# Patient Record
Sex: Male | Born: 1945 | Race: White | Hispanic: No | Marital: Married | Smoking: Former smoker
Health system: Southern US, Community
[De-identification: ages and names within clinical notes are randomized; demographics above are authoritative.]

## PROBLEM LIST (undated history)

## (undated) DIAGNOSIS — N400 Enlarged prostate without lower urinary tract symptoms: Secondary | ICD-10-CM

## (undated) DIAGNOSIS — E119 Type 2 diabetes mellitus without complications: Secondary | ICD-10-CM

## (undated) HISTORY — PX: TRANSURETHRAL RESECTION OF PROSTATE: SHX73

---

## 2006-02-03 ENCOUNTER — Ambulatory Visit (HOSPITAL_COMMUNITY): Payer: Self-pay | Admitting: Psychiatry

## 2006-02-10 ENCOUNTER — Ambulatory Visit (HOSPITAL_COMMUNITY): Payer: Self-pay | Admitting: Psychology

## 2006-02-18 ENCOUNTER — Ambulatory Visit (HOSPITAL_COMMUNITY): Payer: Self-pay | Admitting: Psychology

## 2007-07-17 ENCOUNTER — Ambulatory Visit (HOSPITAL_COMMUNITY): Payer: Self-pay | Admitting: Psychology

## 2007-07-20 ENCOUNTER — Ambulatory Visit (HOSPITAL_COMMUNITY): Payer: Self-pay | Admitting: Psychiatry

## 2015-08-25 DIAGNOSIS — F411 Generalized anxiety disorder: Secondary | ICD-10-CM | POA: Diagnosis not present

## 2015-10-01 DIAGNOSIS — H18421 Band keratopathy, right eye: Secondary | ICD-10-CM | POA: Diagnosis not present

## 2015-10-01 DIAGNOSIS — H2701 Aphakia, right eye: Secondary | ICD-10-CM | POA: Diagnosis not present

## 2015-10-01 DIAGNOSIS — H35372 Puckering of macula, left eye: Secondary | ICD-10-CM | POA: Diagnosis not present

## 2015-12-15 DIAGNOSIS — F411 Generalized anxiety disorder: Secondary | ICD-10-CM | POA: Diagnosis not present

## 2016-01-20 DIAGNOSIS — H905 Unspecified sensorineural hearing loss: Secondary | ICD-10-CM | POA: Diagnosis not present

## 2016-03-01 DIAGNOSIS — F411 Generalized anxiety disorder: Secondary | ICD-10-CM | POA: Diagnosis not present

## 2016-04-15 DIAGNOSIS — F411 Generalized anxiety disorder: Secondary | ICD-10-CM | POA: Diagnosis not present

## 2016-05-17 DIAGNOSIS — F411 Generalized anxiety disorder: Secondary | ICD-10-CM | POA: Diagnosis not present

## 2016-05-28 DIAGNOSIS — R5383 Other fatigue: Secondary | ICD-10-CM | POA: Diagnosis not present

## 2016-05-28 DIAGNOSIS — Z125 Encounter for screening for malignant neoplasm of prostate: Secondary | ICD-10-CM | POA: Diagnosis not present

## 2016-05-28 DIAGNOSIS — Z79899 Other long term (current) drug therapy: Secondary | ICD-10-CM | POA: Diagnosis not present

## 2016-05-28 DIAGNOSIS — Z299 Encounter for prophylactic measures, unspecified: Secondary | ICD-10-CM | POA: Diagnosis not present

## 2016-05-28 DIAGNOSIS — E119 Type 2 diabetes mellitus without complications: Secondary | ICD-10-CM | POA: Diagnosis not present

## 2016-05-28 DIAGNOSIS — E7800 Pure hypercholesterolemia, unspecified: Secondary | ICD-10-CM | POA: Diagnosis not present

## 2016-05-28 DIAGNOSIS — Z1211 Encounter for screening for malignant neoplasm of colon: Secondary | ICD-10-CM | POA: Diagnosis not present

## 2016-05-28 DIAGNOSIS — Z1389 Encounter for screening for other disorder: Secondary | ICD-10-CM | POA: Diagnosis not present

## 2016-05-28 DIAGNOSIS — Z0000 Encounter for general adult medical examination without abnormal findings: Secondary | ICD-10-CM | POA: Diagnosis not present

## 2016-05-28 DIAGNOSIS — Z7189 Other specified counseling: Secondary | ICD-10-CM | POA: Diagnosis not present

## 2016-06-14 DIAGNOSIS — F411 Generalized anxiety disorder: Secondary | ICD-10-CM | POA: Diagnosis not present

## 2016-06-22 DIAGNOSIS — R972 Elevated prostate specific antigen [PSA]: Secondary | ICD-10-CM | POA: Diagnosis not present

## 2016-07-19 DIAGNOSIS — F332 Major depressive disorder, recurrent severe without psychotic features: Secondary | ICD-10-CM | POA: Diagnosis not present

## 2016-07-19 DIAGNOSIS — L570 Actinic keratosis: Secondary | ICD-10-CM | POA: Diagnosis not present

## 2016-07-26 DIAGNOSIS — F411 Generalized anxiety disorder: Secondary | ICD-10-CM | POA: Diagnosis not present

## 2016-08-05 DIAGNOSIS — R972 Elevated prostate specific antigen [PSA]: Secondary | ICD-10-CM | POA: Diagnosis not present

## 2016-08-12 DIAGNOSIS — N401 Enlarged prostate with lower urinary tract symptoms: Secondary | ICD-10-CM | POA: Diagnosis not present

## 2016-08-12 DIAGNOSIS — R3912 Poor urinary stream: Secondary | ICD-10-CM | POA: Diagnosis not present

## 2016-08-12 DIAGNOSIS — R3911 Hesitancy of micturition: Secondary | ICD-10-CM | POA: Diagnosis not present

## 2016-08-12 DIAGNOSIS — R972 Elevated prostate specific antigen [PSA]: Secondary | ICD-10-CM | POA: Diagnosis not present

## 2016-08-12 DIAGNOSIS — R351 Nocturia: Secondary | ICD-10-CM | POA: Diagnosis not present

## 2016-08-18 DIAGNOSIS — Z713 Dietary counseling and surveillance: Secondary | ICD-10-CM | POA: Diagnosis not present

## 2016-08-18 DIAGNOSIS — E119 Type 2 diabetes mellitus without complications: Secondary | ICD-10-CM | POA: Diagnosis not present

## 2016-08-18 DIAGNOSIS — F329 Major depressive disorder, single episode, unspecified: Secondary | ICD-10-CM | POA: Diagnosis not present

## 2016-08-18 DIAGNOSIS — Z6826 Body mass index (BMI) 26.0-26.9, adult: Secondary | ICD-10-CM | POA: Diagnosis not present

## 2016-08-18 DIAGNOSIS — I10 Essential (primary) hypertension: Secondary | ICD-10-CM | POA: Diagnosis not present

## 2016-08-18 DIAGNOSIS — G629 Polyneuropathy, unspecified: Secondary | ICD-10-CM | POA: Diagnosis not present

## 2016-08-18 DIAGNOSIS — Z299 Encounter for prophylactic measures, unspecified: Secondary | ICD-10-CM | POA: Diagnosis not present

## 2016-08-18 DIAGNOSIS — N400 Enlarged prostate without lower urinary tract symptoms: Secondary | ICD-10-CM | POA: Diagnosis not present

## 2016-09-09 DIAGNOSIS — Z139 Encounter for screening, unspecified: Secondary | ICD-10-CM | POA: Diagnosis not present

## 2016-09-09 DIAGNOSIS — R3911 Hesitancy of micturition: Secondary | ICD-10-CM | POA: Diagnosis not present

## 2016-09-09 DIAGNOSIS — R351 Nocturia: Secondary | ICD-10-CM | POA: Diagnosis not present

## 2016-09-09 DIAGNOSIS — Z719 Counseling, unspecified: Secondary | ICD-10-CM | POA: Diagnosis not present

## 2016-09-09 DIAGNOSIS — N401 Enlarged prostate with lower urinary tract symptoms: Secondary | ICD-10-CM | POA: Diagnosis not present

## 2016-09-28 DIAGNOSIS — F411 Generalized anxiety disorder: Secondary | ICD-10-CM | POA: Diagnosis not present

## 2016-10-04 DIAGNOSIS — Z299 Encounter for prophylactic measures, unspecified: Secondary | ICD-10-CM | POA: Diagnosis not present

## 2016-10-04 DIAGNOSIS — E7800 Pure hypercholesterolemia, unspecified: Secondary | ICD-10-CM | POA: Diagnosis not present

## 2016-10-04 DIAGNOSIS — Z713 Dietary counseling and surveillance: Secondary | ICD-10-CM | POA: Diagnosis not present

## 2016-10-04 DIAGNOSIS — E1165 Type 2 diabetes mellitus with hyperglycemia: Secondary | ICD-10-CM | POA: Diagnosis not present

## 2016-10-04 DIAGNOSIS — N400 Enlarged prostate without lower urinary tract symptoms: Secondary | ICD-10-CM | POA: Diagnosis not present

## 2016-10-04 DIAGNOSIS — I10 Essential (primary) hypertension: Secondary | ICD-10-CM | POA: Diagnosis not present

## 2016-10-04 DIAGNOSIS — F329 Major depressive disorder, single episode, unspecified: Secondary | ICD-10-CM | POA: Diagnosis not present

## 2016-10-04 DIAGNOSIS — R972 Elevated prostate specific antigen [PSA]: Secondary | ICD-10-CM | POA: Diagnosis not present

## 2016-10-04 DIAGNOSIS — G629 Polyneuropathy, unspecified: Secondary | ICD-10-CM | POA: Diagnosis not present

## 2016-10-15 DIAGNOSIS — H2701 Aphakia, right eye: Secondary | ICD-10-CM | POA: Diagnosis not present

## 2016-10-15 DIAGNOSIS — H2512 Age-related nuclear cataract, left eye: Secondary | ICD-10-CM | POA: Diagnosis not present

## 2016-11-17 DIAGNOSIS — Z85828 Personal history of other malignant neoplasm of skin: Secondary | ICD-10-CM | POA: Diagnosis not present

## 2016-11-17 DIAGNOSIS — L570 Actinic keratosis: Secondary | ICD-10-CM | POA: Diagnosis not present

## 2016-11-17 DIAGNOSIS — D485 Neoplasm of uncertain behavior of skin: Secondary | ICD-10-CM | POA: Diagnosis not present

## 2016-11-17 DIAGNOSIS — L820 Inflamed seborrheic keratosis: Secondary | ICD-10-CM | POA: Diagnosis not present

## 2016-12-01 DIAGNOSIS — M79672 Pain in left foot: Secondary | ICD-10-CM | POA: Diagnosis not present

## 2016-12-01 DIAGNOSIS — B351 Tinea unguium: Secondary | ICD-10-CM | POA: Diagnosis not present

## 2016-12-01 DIAGNOSIS — M79671 Pain in right foot: Secondary | ICD-10-CM | POA: Diagnosis not present

## 2016-12-29 DIAGNOSIS — F411 Generalized anxiety disorder: Secondary | ICD-10-CM | POA: Diagnosis not present

## 2017-01-17 DIAGNOSIS — N400 Enlarged prostate without lower urinary tract symptoms: Secondary | ICD-10-CM | POA: Diagnosis not present

## 2017-01-17 DIAGNOSIS — E1165 Type 2 diabetes mellitus with hyperglycemia: Secondary | ICD-10-CM | POA: Diagnosis not present

## 2017-01-17 DIAGNOSIS — Z6827 Body mass index (BMI) 27.0-27.9, adult: Secondary | ICD-10-CM | POA: Diagnosis not present

## 2017-01-17 DIAGNOSIS — E7800 Pure hypercholesterolemia, unspecified: Secondary | ICD-10-CM | POA: Diagnosis not present

## 2017-01-17 DIAGNOSIS — Z299 Encounter for prophylactic measures, unspecified: Secondary | ICD-10-CM | POA: Diagnosis not present

## 2017-01-17 DIAGNOSIS — I10 Essential (primary) hypertension: Secondary | ICD-10-CM | POA: Diagnosis not present

## 2017-01-20 DIAGNOSIS — N401 Enlarged prostate with lower urinary tract symptoms: Secondary | ICD-10-CM | POA: Diagnosis not present

## 2017-01-20 DIAGNOSIS — R3911 Hesitancy of micturition: Secondary | ICD-10-CM | POA: Diagnosis not present

## 2017-01-20 DIAGNOSIS — R3912 Poor urinary stream: Secondary | ICD-10-CM | POA: Diagnosis not present

## 2017-01-20 DIAGNOSIS — R972 Elevated prostate specific antigen [PSA]: Secondary | ICD-10-CM | POA: Diagnosis not present

## 2017-01-20 DIAGNOSIS — Z719 Counseling, unspecified: Secondary | ICD-10-CM | POA: Diagnosis not present

## 2017-02-28 DIAGNOSIS — F332 Major depressive disorder, recurrent severe without psychotic features: Secondary | ICD-10-CM | POA: Diagnosis not present

## 2017-04-04 DIAGNOSIS — F411 Generalized anxiety disorder: Secondary | ICD-10-CM | POA: Diagnosis not present

## 2017-04-06 DIAGNOSIS — R05 Cough: Secondary | ICD-10-CM | POA: Diagnosis not present

## 2017-04-06 DIAGNOSIS — R609 Edema, unspecified: Secondary | ICD-10-CM | POA: Diagnosis not present

## 2017-04-06 DIAGNOSIS — Z136 Encounter for screening for cardiovascular disorders: Secondary | ICD-10-CM | POA: Diagnosis not present

## 2017-04-06 DIAGNOSIS — R5383 Other fatigue: Secondary | ICD-10-CM | POA: Diagnosis not present

## 2017-05-09 DIAGNOSIS — F332 Major depressive disorder, recurrent severe without psychotic features: Secondary | ICD-10-CM | POA: Diagnosis not present

## 2017-05-11 DIAGNOSIS — R600 Localized edema: Secondary | ICD-10-CM | POA: Diagnosis not present

## 2017-05-23 DIAGNOSIS — R600 Localized edema: Secondary | ICD-10-CM | POA: Diagnosis not present

## 2017-05-31 DIAGNOSIS — R972 Elevated prostate specific antigen [PSA]: Secondary | ICD-10-CM | POA: Diagnosis not present

## 2017-06-07 DIAGNOSIS — R972 Elevated prostate specific antigen [PSA]: Secondary | ICD-10-CM | POA: Diagnosis not present

## 2017-06-10 DIAGNOSIS — Z1331 Encounter for screening for depression: Secondary | ICD-10-CM | POA: Diagnosis not present

## 2017-06-10 DIAGNOSIS — Z1339 Encounter for screening examination for other mental health and behavioral disorders: Secondary | ICD-10-CM | POA: Diagnosis not present

## 2017-06-10 DIAGNOSIS — I10 Essential (primary) hypertension: Secondary | ICD-10-CM | POA: Diagnosis not present

## 2017-06-10 DIAGNOSIS — E7800 Pure hypercholesterolemia, unspecified: Secondary | ICD-10-CM | POA: Diagnosis not present

## 2017-06-10 DIAGNOSIS — R5383 Other fatigue: Secondary | ICD-10-CM | POA: Diagnosis not present

## 2017-06-10 DIAGNOSIS — Z79899 Other long term (current) drug therapy: Secondary | ICD-10-CM | POA: Diagnosis not present

## 2017-06-10 DIAGNOSIS — Z7189 Other specified counseling: Secondary | ICD-10-CM | POA: Diagnosis not present

## 2017-06-10 DIAGNOSIS — Z6827 Body mass index (BMI) 27.0-27.9, adult: Secondary | ICD-10-CM | POA: Diagnosis not present

## 2017-06-10 DIAGNOSIS — Z1211 Encounter for screening for malignant neoplasm of colon: Secondary | ICD-10-CM | POA: Diagnosis not present

## 2017-06-10 DIAGNOSIS — Z0000 Encounter for general adult medical examination without abnormal findings: Secondary | ICD-10-CM | POA: Diagnosis not present

## 2017-06-10 DIAGNOSIS — Z299 Encounter for prophylactic measures, unspecified: Secondary | ICD-10-CM | POA: Diagnosis not present

## 2017-06-27 DIAGNOSIS — F411 Generalized anxiety disorder: Secondary | ICD-10-CM | POA: Diagnosis not present

## 2017-07-15 DIAGNOSIS — M7541 Impingement syndrome of right shoulder: Secondary | ICD-10-CM | POA: Diagnosis not present

## 2017-07-15 DIAGNOSIS — M19011 Primary osteoarthritis, right shoulder: Secondary | ICD-10-CM | POA: Diagnosis not present

## 2017-07-15 DIAGNOSIS — G8929 Other chronic pain: Secondary | ICD-10-CM | POA: Diagnosis not present

## 2017-07-15 DIAGNOSIS — R29898 Other symptoms and signs involving the musculoskeletal system: Secondary | ICD-10-CM | POA: Diagnosis not present

## 2017-07-15 DIAGNOSIS — M25511 Pain in right shoulder: Secondary | ICD-10-CM | POA: Diagnosis not present

## 2017-07-18 DIAGNOSIS — D0461 Carcinoma in situ of skin of right upper limb, including shoulder: Secondary | ICD-10-CM | POA: Diagnosis not present

## 2017-07-18 DIAGNOSIS — Z85828 Personal history of other malignant neoplasm of skin: Secondary | ICD-10-CM | POA: Diagnosis not present

## 2017-07-18 DIAGNOSIS — L570 Actinic keratosis: Secondary | ICD-10-CM | POA: Diagnosis not present

## 2017-07-18 DIAGNOSIS — D485 Neoplasm of uncertain behavior of skin: Secondary | ICD-10-CM | POA: Diagnosis not present

## 2017-07-22 DIAGNOSIS — M75101 Unspecified rotator cuff tear or rupture of right shoulder, not specified as traumatic: Secondary | ICD-10-CM | POA: Diagnosis not present

## 2017-07-22 DIAGNOSIS — M75121 Complete rotator cuff tear or rupture of right shoulder, not specified as traumatic: Secondary | ICD-10-CM | POA: Diagnosis not present

## 2017-07-22 DIAGNOSIS — R29898 Other symptoms and signs involving the musculoskeletal system: Secondary | ICD-10-CM | POA: Diagnosis not present

## 2017-07-22 DIAGNOSIS — G8929 Other chronic pain: Secondary | ICD-10-CM | POA: Diagnosis not present

## 2017-07-22 DIAGNOSIS — Y33XXXA Other specified events, undetermined intent, initial encounter: Secondary | ICD-10-CM | POA: Diagnosis not present

## 2017-07-22 DIAGNOSIS — M25511 Pain in right shoulder: Secondary | ICD-10-CM | POA: Diagnosis not present

## 2017-07-22 DIAGNOSIS — S46011A Strain of muscle(s) and tendon(s) of the rotator cuff of right shoulder, initial encounter: Secondary | ICD-10-CM | POA: Diagnosis not present

## 2017-08-04 DIAGNOSIS — C44622 Squamous cell carcinoma of skin of right upper limb, including shoulder: Secondary | ICD-10-CM | POA: Diagnosis not present

## 2017-09-05 DIAGNOSIS — M19011 Primary osteoarthritis, right shoulder: Secondary | ICD-10-CM | POA: Diagnosis not present

## 2017-09-05 DIAGNOSIS — M75121 Complete rotator cuff tear or rupture of right shoulder, not specified as traumatic: Secondary | ICD-10-CM | POA: Diagnosis not present

## 2017-09-05 DIAGNOSIS — M7541 Impingement syndrome of right shoulder: Secondary | ICD-10-CM | POA: Diagnosis not present

## 2017-09-05 DIAGNOSIS — M67921 Unspecified disorder of synovium and tendon, right upper arm: Secondary | ICD-10-CM | POA: Diagnosis not present

## 2017-09-05 DIAGNOSIS — M65811 Other synovitis and tenosynovitis, right shoulder: Secondary | ICD-10-CM | POA: Diagnosis not present

## 2017-09-14 DIAGNOSIS — F332 Major depressive disorder, recurrent severe without psychotic features: Secondary | ICD-10-CM | POA: Diagnosis not present

## 2017-09-19 DIAGNOSIS — F411 Generalized anxiety disorder: Secondary | ICD-10-CM | POA: Diagnosis not present

## 2017-09-21 DIAGNOSIS — Z136 Encounter for screening for cardiovascular disorders: Secondary | ICD-10-CM | POA: Diagnosis not present

## 2017-09-21 DIAGNOSIS — R5383 Other fatigue: Secondary | ICD-10-CM | POA: Diagnosis not present

## 2017-09-21 DIAGNOSIS — R609 Edema, unspecified: Secondary | ICD-10-CM | POA: Diagnosis not present

## 2018-01-16 DIAGNOSIS — L570 Actinic keratosis: Secondary | ICD-10-CM | POA: Diagnosis not present

## 2018-02-22 DIAGNOSIS — H2512 Age-related nuclear cataract, left eye: Secondary | ICD-10-CM | POA: Diagnosis not present

## 2018-02-22 DIAGNOSIS — H2701 Aphakia, right eye: Secondary | ICD-10-CM | POA: Diagnosis not present

## 2018-02-22 DIAGNOSIS — H18421 Band keratopathy, right eye: Secondary | ICD-10-CM | POA: Diagnosis not present

## 2018-03-06 DIAGNOSIS — F411 Generalized anxiety disorder: Secondary | ICD-10-CM | POA: Diagnosis not present

## 2018-03-07 ENCOUNTER — Ambulatory Visit (INDEPENDENT_AMBULATORY_CARE_PROVIDER_SITE_OTHER): Payer: Medicare Other | Admitting: Urology

## 2018-03-07 DIAGNOSIS — R972 Elevated prostate specific antigen [PSA]: Secondary | ICD-10-CM

## 2018-03-07 DIAGNOSIS — N401 Enlarged prostate with lower urinary tract symptoms: Secondary | ICD-10-CM | POA: Diagnosis not present

## 2018-03-07 DIAGNOSIS — R351 Nocturia: Secondary | ICD-10-CM | POA: Diagnosis not present

## 2018-05-05 DIAGNOSIS — R972 Elevated prostate specific antigen [PSA]: Secondary | ICD-10-CM | POA: Diagnosis not present

## 2018-06-26 DIAGNOSIS — Z79899 Other long term (current) drug therapy: Secondary | ICD-10-CM | POA: Diagnosis not present

## 2018-06-26 DIAGNOSIS — Z1339 Encounter for screening examination for other mental health and behavioral disorders: Secondary | ICD-10-CM | POA: Diagnosis not present

## 2018-06-26 DIAGNOSIS — R5383 Other fatigue: Secondary | ICD-10-CM | POA: Diagnosis not present

## 2018-06-26 DIAGNOSIS — Z1331 Encounter for screening for depression: Secondary | ICD-10-CM | POA: Diagnosis not present

## 2018-06-26 DIAGNOSIS — E7800 Pure hypercholesterolemia, unspecified: Secondary | ICD-10-CM | POA: Diagnosis not present

## 2018-06-26 DIAGNOSIS — Z6829 Body mass index (BMI) 29.0-29.9, adult: Secondary | ICD-10-CM | POA: Diagnosis not present

## 2018-06-26 DIAGNOSIS — Z7189 Other specified counseling: Secondary | ICD-10-CM | POA: Diagnosis not present

## 2018-06-26 DIAGNOSIS — Z1211 Encounter for screening for malignant neoplasm of colon: Secondary | ICD-10-CM | POA: Diagnosis not present

## 2018-06-26 DIAGNOSIS — Z0000 Encounter for general adult medical examination without abnormal findings: Secondary | ICD-10-CM | POA: Diagnosis not present

## 2018-06-26 DIAGNOSIS — E1165 Type 2 diabetes mellitus with hyperglycemia: Secondary | ICD-10-CM | POA: Diagnosis not present

## 2018-06-26 DIAGNOSIS — Z125 Encounter for screening for malignant neoplasm of prostate: Secondary | ICD-10-CM | POA: Diagnosis not present

## 2018-06-26 DIAGNOSIS — I10 Essential (primary) hypertension: Secondary | ICD-10-CM | POA: Diagnosis not present

## 2018-06-26 DIAGNOSIS — Z299 Encounter for prophylactic measures, unspecified: Secondary | ICD-10-CM | POA: Diagnosis not present

## 2018-06-30 ENCOUNTER — Other Ambulatory Visit: Payer: Self-pay | Admitting: Psychiatry

## 2018-07-01 NOTE — Telephone Encounter (Signed)
Need to review his paper chart, not seen in epic yet

## 2018-07-06 DIAGNOSIS — J22 Unspecified acute lower respiratory infection: Secondary | ICD-10-CM | POA: Diagnosis not present

## 2018-07-14 ENCOUNTER — Encounter: Payer: Self-pay | Admitting: Emergency Medicine

## 2018-07-14 DIAGNOSIS — F319 Bipolar disorder, unspecified: Secondary | ICD-10-CM | POA: Insufficient documentation

## 2018-07-14 DIAGNOSIS — F411 Generalized anxiety disorder: Secondary | ICD-10-CM | POA: Insufficient documentation

## 2018-07-17 DIAGNOSIS — L905 Scar conditions and fibrosis of skin: Secondary | ICD-10-CM | POA: Diagnosis not present

## 2018-07-17 DIAGNOSIS — D485 Neoplasm of uncertain behavior of skin: Secondary | ICD-10-CM | POA: Diagnosis not present

## 2018-07-17 DIAGNOSIS — L570 Actinic keratosis: Secondary | ICD-10-CM | POA: Diagnosis not present

## 2018-07-21 DIAGNOSIS — Z23 Encounter for immunization: Secondary | ICD-10-CM | POA: Diagnosis not present

## 2018-07-25 DIAGNOSIS — R69 Illness, unspecified: Secondary | ICD-10-CM | POA: Diagnosis not present

## 2018-08-10 ENCOUNTER — Telehealth: Payer: Self-pay | Admitting: Psychiatry

## 2018-08-10 NOTE — Telephone Encounter (Signed)
Needs lithium level drawn. Send lab order to [facility] in [place]  [phone]

## 2018-08-14 ENCOUNTER — Ambulatory Visit: Payer: Self-pay | Admitting: Psychiatry

## 2018-08-17 ENCOUNTER — Other Ambulatory Visit: Payer: Self-pay | Admitting: Psychiatry

## 2018-08-17 NOTE — Telephone Encounter (Signed)
Lab order faxed to [facility] [phone]

## 2018-08-17 NOTE — Telephone Encounter (Signed)
Not seen in epic Pull paper chart for review

## 2018-08-21 DIAGNOSIS — Z79899 Other long term (current) drug therapy: Secondary | ICD-10-CM | POA: Diagnosis not present

## 2018-08-22 ENCOUNTER — Ambulatory Visit: Payer: Medicare Other | Admitting: Urology

## 2018-08-22 DIAGNOSIS — R972 Elevated prostate specific antigen [PSA]: Secondary | ICD-10-CM | POA: Diagnosis not present

## 2018-08-22 DIAGNOSIS — N400 Enlarged prostate without lower urinary tract symptoms: Secondary | ICD-10-CM | POA: Diagnosis not present

## 2018-08-22 DIAGNOSIS — R351 Nocturia: Secondary | ICD-10-CM | POA: Diagnosis not present

## 2018-08-24 ENCOUNTER — Ambulatory Visit: Payer: Self-pay | Admitting: Psychiatry

## 2018-09-20 ENCOUNTER — Other Ambulatory Visit: Payer: Self-pay

## 2018-09-20 ENCOUNTER — Ambulatory Visit: Payer: Medicare HMO | Admitting: Psychiatry

## 2018-09-20 ENCOUNTER — Encounter: Payer: Self-pay | Admitting: Psychiatry

## 2018-09-20 DIAGNOSIS — G609 Hereditary and idiopathic neuropathy, unspecified: Secondary | ICD-10-CM | POA: Diagnosis not present

## 2018-09-20 DIAGNOSIS — R69 Illness, unspecified: Secondary | ICD-10-CM | POA: Diagnosis not present

## 2018-09-20 DIAGNOSIS — F3181 Bipolar II disorder: Secondary | ICD-10-CM

## 2018-09-20 DIAGNOSIS — F5105 Insomnia due to other mental disorder: Secondary | ICD-10-CM | POA: Diagnosis not present

## 2018-09-20 DIAGNOSIS — F411 Generalized anxiety disorder: Secondary | ICD-10-CM

## 2018-09-20 NOTE — Progress Notes (Signed)
ERVEN RAMSON [id number] 1945-09-12 73 y.o.  Subjective:   Patient ID:  Joseph Banks is a 73 y.o. (DOB [birth year]) male.  Chief Complaint:  Chief Complaint  Patient presents with  . Follow-up    Medication Management    HPI Joseph Banks presents to the office today for follow-up of depression and anxiety.    Last seen August.   Changed lithium slightly to 600 mg and increased the gabapentin to 600 BID.  Marland Kitchen  This has improved compliance.  Increase in gabapentin helped the neuropathic tingling in R foot. Plays pickle ball regularly and tries to stay active. Mood good.  No depressive episodes.  Patient reports stable mood and denies depressed or irritable moods.  Patient denies any recent difficulty with anxiety.  Patient denies difficulty with sleep initiation but is interrrupted with normal quantity. Denies appetite disturbance.  Patient reports that energy and motivation have been good.  Patient denies any difficulty with concentration.  Patient denies any suicidal ideation.  Past Psychiatric Medication Trials: Citalopram no response paroxetine 40 with benefit, mirtazapine, duloxetine 90+ lithium with response, aripiprazole 10, lithium 900, trazodone with benefit, alprazolam, buspirone  Review of Systems:  Review of Systems  Neurological: Negative for tremors and weakness.  Psychiatric/Behavioral: Negative for agitation, behavioral problems, confusion, decreased concentration, dysphoric mood, hallucinations, self-injury, sleep disturbance and suicidal ideas. The patient is not nervous/anxious and is not hyperactive.     Medications: I have reviewed the patient's current medications.  Current Outpatient Medications  Medication Sig Dispense Refill  . DULoxetine (CYMBALTA) 30 MG capsule Take 30 mg by mouth daily.    Marland Kitchen gabapentin (NEURONTIN) 600 MG tablet Take 600 mg by mouth 3 (three) times daily.    Marland Kitchen lithium 600 MG capsule Take 600 mg by mouth every evening.    .  Potassium 99 MG TABS Take by mouth.    . traZODone (DESYREL) 50 MG tablet TAKE 1 TABLET BY MOUTH ONCE DAILY AT NIGHT 180 tablet 0   No current facility-administered medications for this visit.     Medication Side Effects: None  Allergies: No Known Allergies  No past medical history on file.  No family history on file.  Social History   Socioeconomic History  . Marital status: Married    Spouse name: Not on file  . Number of children: Not on file  . Years of education: Not on file  . Highest education level: Not on file  Occupational History  . Not on file  Social Needs  . Financial resource strain: Not on file  . Food insecurity:    Worry: Not on file    Inability: Not on file  . Transportation needs:    Medical: Not on file    Non-medical: Not on file  Tobacco Use  . Smoking status: Former Research scientist (life sciences)  . Smokeless tobacco: Never Used  Substance and Sexual Activity  . Alcohol use: Not on file  . Drug use: Not on file  . Sexual activity: Not on file  Lifestyle  . Physical activity:    Days per week: Not on file    Minutes per session: Not on file  . Stress: Not on file  Relationships  . Social connections:    Talks on phone: Not on file    Gets together: Not on file    Attends religious service: Not on file    Active member of club or organization: Not on file    Attends meetings of clubs or organizations:  Not on file    Relationship status: Not on file  . Intimate partner violence:    Fear of current or ex partner: Not on file    Emotionally abused: Not on file    Physically abused: Not on file    Forced sexual activity: Not on file  Other Topics Concern  . Not on file  Social History Narrative  . Not on file    Past Medical History, Surgical history, Social history, and Family history were reviewed and updated as appropriate.   Please see review of systems for further details on the patient's review from today.   Objective:   Physical Exam:  There  were no vitals taken for this visit.  Physical Exam Constitutional:      General: He is not in acute distress.    Appearance: He is well-developed.  Musculoskeletal:        General: No deformity.  Neurological:     Mental Status: He is alert and oriented to person, place, and time.     Motor: No tremor.     Coordination: Coordination normal.     Gait: Gait normal.  Psychiatric:        Attention and Perception: Attention normal. He is attentive.        Mood and Affect: Mood normal. Mood is not anxious or depressed. Affect is not labile, blunt, angry or inappropriate.        Speech: Speech normal.        Behavior: Behavior normal.        Thought Content: Thought content normal. Thought content does not include homicidal or suicidal ideation. Thought content does not include homicidal or suicidal plan.        Cognition and Memory: Cognition normal.        Judgment: Judgment normal.     Comments: Insight is fair.     Lab Review:  No results found for: NA, K, CL, CO2, GLUCOSE, BUN, CREATININE, CALCIUM, PROT, ALBUMIN, AST, ALT, ALKPHOS, BILITOT, GFRNONAA, GFRAA  No results found for: WBC, RBC, HGB, HCT, PLT, MCV, MCH, MCHC, RDW, LYMPHSABS, MONOABS, EOSABS, BASOSABS  No results found for: POCLITH, LITHIUM   No results found for: PHENYTOIN, PHENOBARB, VALPROATE, CBMZ   Labs from August 21, 2018 included BMP which was normal including calcium 9.4 and creatinine 1.2, TSH 1.85 normal, lithium 0.68 stable  .res Assessment: Plan:    Bipolar II disorder ([place])  Generalized anxiety disorder  Insomnia due to mental condition  Hereditary and idiopathic peripheral neuropathy   Greater than 50% of face to face time with patient was spent on counseling and coordination of care. We discussed Importance of consistency emphasized bc history or problems causing relapse.  His insight into the bipolar disorder is somewhat lacking as well and this was discussed.  Simplifying the medication has  helped compliant and he is more stable.  He is tolerating medications well.  Discussed the risk that antidepressants may cause mood cycling and mania but he appears to need it to manage the anxiety.  The lithium is working adequately.  Counseled patient regarding potential benefits, risks, and side effects of lithium to include potential risk of lithium affecting thyroid and renal function.  Discussed need for periodic lab monitoring to determine drug level and to assess for potential adverse effects.  Counseled patient regarding signs and symptoms of lithium toxicity and advised that they notify office immediately or seek urgent medical attention if experiencing these signs and symptoms.  Patient advised  to contact office with any questions or concerns.  The labs were good.  Gabapentin has helped the neuropathy and he is tolerating it well.  Trazodone is managing the insomnia in general with mild awakening.  Discussed the use of melatonin supplements which are acceptable.  Discussed risks of sedatives including fall risk at night.   History of severe anxiety has been managed with medication.  No med changes indicated.  This was a 30-minute appointment  Follow-up 6 months.  Lynder Parents, MD, DFAPA   Please see After Visit Summary for patient specific instructions.  Future Appointments  Date Time Provider [place]  03/26/2019 10:15 AM Cottle, Billey Co., MD [facility] None    No orders of the defined types were placed in this encounter.     -------------------------------

## 2018-10-04 ENCOUNTER — Telehealth: Payer: Self-pay | Admitting: Psychiatry

## 2018-10-04 NOTE — Telephone Encounter (Signed)
Pt can get a better price at [facility] [address] [facility] in [place], [place]. Please change his pharmacy. Also, call in a new Gabapentine RX to [facility].

## 2018-10-04 NOTE — Telephone Encounter (Signed)
ok.  Please send prescription to [facility] as patient requests

## 2018-10-05 ENCOUNTER — Other Ambulatory Visit: Payer: Self-pay

## 2018-10-05 MED ORDER — LITHIUM CARBONATE 600 MG PO CAPS: 600.0000 mg | ORAL_CAPSULE | Freq: Every evening | ORAL | 1 refills | Status: DC

## 2018-10-05 MED ORDER — GABAPENTIN 600 MG PO TABS: 600.0000 mg | ORAL_TABLET | Freq: Two times a day (BID) | ORAL | 1 refills | Status: DC

## 2018-10-05 MED ORDER — TRAZODONE HCL 50 MG PO TABS: ORAL_TABLET | ORAL | 1 refills | Status: DC

## 2018-10-05 MED ORDER — DULOXETINE HCL 30 MG PO CPEP: 30.0000 mg | ORAL_CAPSULE | Freq: Every day | ORAL | 1 refills | Status: DC

## 2018-10-05 NOTE — Telephone Encounter (Signed)
Prescriptions submitted.  

## 2018-10-05 NOTE — Progress Notes (Signed)
Patient asking to change his prescriptions to [facility], [place] Gabapentin called in and others escribed for 90 day plus 1 additional refill.

## 2018-12-11 DIAGNOSIS — Z299 Encounter for prophylactic measures, unspecified: Secondary | ICD-10-CM | POA: Diagnosis not present

## 2018-12-11 DIAGNOSIS — R5383 Other fatigue: Secondary | ICD-10-CM | POA: Diagnosis not present

## 2018-12-11 DIAGNOSIS — E1165 Type 2 diabetes mellitus with hyperglycemia: Secondary | ICD-10-CM | POA: Diagnosis not present

## 2018-12-11 DIAGNOSIS — I10 Essential (primary) hypertension: Secondary | ICD-10-CM | POA: Diagnosis not present

## 2018-12-11 DIAGNOSIS — Z6829 Body mass index (BMI) 29.0-29.9, adult: Secondary | ICD-10-CM | POA: Diagnosis not present

## 2018-12-25 DIAGNOSIS — E663 Overweight: Secondary | ICD-10-CM | POA: Diagnosis not present

## 2018-12-25 DIAGNOSIS — I10 Essential (primary) hypertension: Secondary | ICD-10-CM | POA: Diagnosis not present

## 2018-12-25 DIAGNOSIS — E119 Type 2 diabetes mellitus without complications: Secondary | ICD-10-CM | POA: Diagnosis not present

## 2019-01-23 DIAGNOSIS — E663 Overweight: Secondary | ICD-10-CM | POA: Diagnosis not present

## 2019-01-23 DIAGNOSIS — I10 Essential (primary) hypertension: Secondary | ICD-10-CM | POA: Diagnosis not present

## 2019-01-23 DIAGNOSIS — E119 Type 2 diabetes mellitus without complications: Secondary | ICD-10-CM | POA: Diagnosis not present

## 2019-02-19 DIAGNOSIS — I10 Essential (primary) hypertension: Secondary | ICD-10-CM | POA: Diagnosis not present

## 2019-02-19 DIAGNOSIS — Z299 Encounter for prophylactic measures, unspecified: Secondary | ICD-10-CM | POA: Diagnosis not present

## 2019-02-19 DIAGNOSIS — Z6828 Body mass index (BMI) 28.0-28.9, adult: Secondary | ICD-10-CM | POA: Diagnosis not present

## 2019-02-19 DIAGNOSIS — Z789 Other specified health status: Secondary | ICD-10-CM | POA: Diagnosis not present

## 2019-02-19 DIAGNOSIS — E7800 Pure hypercholesterolemia, unspecified: Secondary | ICD-10-CM | POA: Diagnosis not present

## 2019-02-19 DIAGNOSIS — E1165 Type 2 diabetes mellitus with hyperglycemia: Secondary | ICD-10-CM | POA: Diagnosis not present

## 2019-02-19 DIAGNOSIS — G629 Polyneuropathy, unspecified: Secondary | ICD-10-CM | POA: Diagnosis not present

## 2019-03-13 ENCOUNTER — Ambulatory Visit: Payer: Medicare HMO | Admitting: Urology

## 2019-03-22 DIAGNOSIS — N400 Enlarged prostate without lower urinary tract symptoms: Secondary | ICD-10-CM | POA: Diagnosis not present

## 2019-03-26 ENCOUNTER — Encounter: Payer: Self-pay | Admitting: Psychiatry

## 2019-03-26 ENCOUNTER — Other Ambulatory Visit: Payer: Self-pay

## 2019-03-26 ENCOUNTER — Ambulatory Visit (INDEPENDENT_AMBULATORY_CARE_PROVIDER_SITE_OTHER): Payer: Medicare HMO | Admitting: Psychiatry

## 2019-03-26 DIAGNOSIS — F411 Generalized anxiety disorder: Secondary | ICD-10-CM

## 2019-03-26 DIAGNOSIS — F5105 Insomnia due to other mental disorder: Secondary | ICD-10-CM

## 2019-03-26 DIAGNOSIS — R69 Illness, unspecified: Secondary | ICD-10-CM | POA: Diagnosis not present

## 2019-03-26 DIAGNOSIS — F3181 Bipolar II disorder: Secondary | ICD-10-CM | POA: Diagnosis not present

## 2019-03-26 NOTE — Progress Notes (Signed)
Joseph Banks [id number] 1946-06-09 73 y.o.   Virtual Visit via Telephone Note  I connected with pt by telephone and verified that I am speaking with the correct person using two identifiers.   I discussed the limitations, risks, security and privacy concerns of performing an evaluation and management service by telephone and the availability of in person appointments. I also discussed with the patient that there may be a patient responsible charge related to this service. The patient expressed understanding and agreed to proceed.  I discussed the assessment and treatment plan with the patient. The patient was provided an opportunity to ask questions and all were answered. The patient agreed with the plan and demonstrated an understanding of the instructions.   The patient was advised to call back or seek an in-person evaluation if the symptoms worsen or if the condition fails to improve as anticipated.  I provided 30 minutes of non-face-to-face time during this encounter. The call started at 1030 and ended at 11:00. The patient was located at home and the provider was located office.   Subjective:   Patient ID:  Joseph Banks is a 73 y.o. (DOB 1945-12-11) male.  Chief Complaint:  Chief Complaint  Patient presents with  . Follow-up    Medication Management  . Anxiety    Medication Management  . Other    Bipolar 2    Anxiety Patient reports no confusion, decreased concentration, nervous/anxious behavior or suicidal ideas.     Joseph Banks presents to the office today for follow-up of depression and anxiety.    In 2019 August.   Changed lithium slightly to 600 mg and increased the gabapentin to 600 BID.    Last visit in March he was doing well and was more compliant with medication with a good response.  No meds were changed.  No depression but don't sleep well with nocturia and neuropathy with toes tingling.  PCP suggested added Lyrica for neuropathy.  Tingling is  better.    This has improved compliance.  Increase in gabapentin helped the neuropathic tingling in R foot. Plays pickle ball regularly and tries to stay active. Mood good.  No depressive episodes.  Patient reports stable mood and denies depressed or irritable moods.  Patient denies any recent difficulty with anxiety.  Patient denies difficulty with sleep initiation but is interrrupted with normal quantity. Denies appetite disturbance.  Patient reports that energy and motivation have been good.  Patient denies any difficulty with concentration.  Patient denies any suicidal ideation.  Past Psychiatric Medication Trials: Citalopram no response paroxetine 40 with benefit, mirtazapine, duloxetine 90+ lithium with response, aripiprazole 10, lithium 900, trazodone with benefit, alprazolam, buspirone  Review of Systems:  Review of Systems  Genitourinary:       Nocturia  Neurological: Negative for tremors and weakness.       Tingling in toes  Psychiatric/Behavioral: Negative for agitation, behavioral problems, confusion, decreased concentration, dysphoric mood, hallucinations, self-injury, sleep disturbance and suicidal ideas. The patient is not nervous/anxious and is not hyperactive.     Medications: I have reviewed the patient's current medications.  Current Outpatient Medications  Medication Sig Dispense Refill  . DULoxetine (CYMBALTA) 30 MG capsule Take 1 capsule (30 mg total) by mouth daily. 90 capsule 1  . gabapentin (NEURONTIN) 600 MG tablet Take 1 tablet (600 mg total) by mouth 2 (two) times daily. 180 tablet 1  . lithium 600 MG capsule Take 1 capsule (600 mg total) by mouth every evening. 90 capsule  1  . metFORMIN (GLUCOPHAGE) 500 MG tablet Take 500 mg by mouth 2 (two) times daily.    . Potassium 99 MG TABS Take by mouth.    . pregabalin (LYRICA) 100 MG capsule Take 100 mg by mouth daily.    . traZODone (DESYREL) 50 MG tablet TAKE 1 TABLET BY MOUTH ONCE DAILY AT NIGHT 180 tablet 1   No  current facility-administered medications for this visit.     Medication Side Effects: None  Allergies: No Known Allergies  History reviewed. No pertinent past medical history.  History reviewed. No pertinent family history.  Social History   Socioeconomic History  . Marital status: Married    Spouse name: Not on file  . Number of children: Not on file  . Years of education: Not on file  . Highest education level: Not on file  Occupational History  . Not on file  Social Needs  . Financial resource strain: Not on file  . Food insecurity    Worry: Not on file    Inability: Not on file  . Transportation needs    Medical: Not on file    Non-medical: Not on file  Tobacco Use  . Smoking status: Former Research scientist (life sciences)  . Smokeless tobacco: Never Used  Substance and Sexual Activity  . Alcohol use: Not on file  . Drug use: Not on file  . Sexual activity: Not on file  Lifestyle  . Physical activity    Days per week: Not on file    Minutes per session: Not on file  . Stress: Not on file  Relationships  . Social Herbalist on phone: Not on file    Gets together: Not on file    Attends religious service: Not on file    Active member of club or organization: Not on file    Attends meetings of clubs or organizations: Not on file    Relationship status: Not on file  . Intimate partner violence    Fear of current or ex partner: Not on file    Emotionally abused: Not on file    Physically abused: Not on file    Forced sexual activity: Not on file  Other Topics Concern  . Not on file  Social History Narrative  . Not on file    Past Medical History, Surgical history, Social history, and Family history were reviewed and updated as appropriate.   Please see review of systems for further details on the patient's review from today.   Objective:   Physical Exam:  There were no vitals taken for this visit.  Physical Exam Neurological:     Mental Status: He is alert and  oriented to person, place, and time.     Cranial Nerves: No dysarthria.  Psychiatric:        Attention and Perception: Attention normal. He is attentive. He does not perceive auditory hallucinations.        Mood and Affect: Mood normal. Mood is not anxious or depressed.        Speech: Speech normal.        Behavior: Behavior is cooperative.        Thought Content: Thought content normal. Thought content is not paranoid or delusional. Thought content does not include homicidal or suicidal ideation. Thought content does not include homicidal or suicidal plan.        Cognition and Memory: Cognition and memory normal.        Judgment: Judgment normal.  Comments: Insight fair.  Historically this is been a problem     Lab Review:  No results found for: NA, K, CL, CO2, GLUCOSE, BUN, CREATININE, CALCIUM, PROT, ALBUMIN, AST, ALT, ALKPHOS, BILITOT, GFRNONAA, GFRAA  No results found for: WBC, RBC, HGB, HCT, PLT, MCV, MCH, MCHC, RDW, LYMPHSABS, MONOABS, EOSABS, BASOSABS  No results found for: POCLITH, LITHIUM   No results found for: PHENYTOIN, PHENOBARB, VALPROATE, CBMZ   Labs from August 21, 2018 included BMP which was normal including calcium 9.4 and creatinine 1.2, TSH 1.85 normal, lithium 0.68 stable  .res Assessment: Plan:    Bipolar II disorder ([place]) - Plan: Lithium level  Generalized anxiety disorder  Insomnia due to mental condition   Greater than 50% of phone to phone with patient was spent on counseling and coordination of care. We discussed Importance of consistency emphasized bc history or problems causing relapse.    Simplifying the medication has helped compliant and he is more stable.  He is tolerating medications well.  Discussed the risk that antidepressants may cause mood cycling and mania but he appears to need it to manage the anxiety.  The lithium is working adequately.  He has had no significant mood swings or depressive bouts since he was last here.  He satisfied  with the medications except for questions about gabapentin and Lyrica.  Counseled patient regarding potential benefits, risks, and side effects of lithium to include potential risk of lithium affecting thyroid and renal function.  Discussed need for periodic lab monitoring to determine drug level and to assess for potential adverse effects.  Counseled patient regarding signs and symptoms of lithium toxicity and advised that they notify office immediately or seek urgent medical attention if experiencing these signs and symptoms.  Patient advised to contact office with any questions or concerns.  The labs were good in February including BMP and TSH.  Continue lithium 600 mg nightly rec lithium level when possible.  Gabapentin versus Lyrica was discussed in detail for neuropathy as well as off label use for anxiety.  He had experienced benefit from the gabapentin in the past but his neuropathy has gotten worse which he attributes to his diabetes.  His other doctor has started a low dose of Lyrica but did not recommend changing the gabapentin dosage.  We discussed this in detail and if she decides to further increase the Lyrica we should probably look at reducing and trying to wean the gabapentin.  Discussed the risk of a flareup of anxiety and doing so.Disc potential additive SE with gabapentin and Lyrica.  Disc potential balance issues and be careful getting up at night. As of today no change in gabapentin dosage.  Continue 600 mg twice daily  Trazodone is managing the insomnia except for nocturia and neuropathy in general.  Discussed the use of melatonin supplements which are acceptable.  Discussed risks of sedatives including fall risk at night. Disc option of nocturia meds, he''ll disc with urologist.  History of severe anxiety has been managed with medication.  Both the duloxetine and gabapentin are assumed to be helping.  No med changes indicated.  This was a 30-minute appointment  Follow-up 6  months.  Lynder Parents, MD, DFAPA   Please see After Visit Summary for patient specific instructions.  No future appointments.  Orders Placed This Encounter  Procedures  . Lithium level      -------------------------------

## 2019-03-28 DIAGNOSIS — L570 Actinic keratosis: Secondary | ICD-10-CM | POA: Diagnosis not present

## 2019-03-31 DIAGNOSIS — R69 Illness, unspecified: Secondary | ICD-10-CM | POA: Diagnosis not present

## 2019-04-17 DIAGNOSIS — S39012A Strain of muscle, fascia and tendon of lower back, initial encounter: Secondary | ICD-10-CM | POA: Diagnosis not present

## 2019-04-17 DIAGNOSIS — I10 Essential (primary) hypertension: Secondary | ICD-10-CM | POA: Diagnosis not present

## 2019-04-17 DIAGNOSIS — E1165 Type 2 diabetes mellitus with hyperglycemia: Secondary | ICD-10-CM | POA: Diagnosis not present

## 2019-04-17 DIAGNOSIS — Z299 Encounter for prophylactic measures, unspecified: Secondary | ICD-10-CM | POA: Diagnosis not present

## 2019-04-17 DIAGNOSIS — Z6828 Body mass index (BMI) 28.0-28.9, adult: Secondary | ICD-10-CM | POA: Diagnosis not present

## 2019-04-24 ENCOUNTER — Ambulatory Visit (INDEPENDENT_AMBULATORY_CARE_PROVIDER_SITE_OTHER): Payer: Medicare HMO | Admitting: Urology

## 2019-04-24 DIAGNOSIS — N401 Enlarged prostate with lower urinary tract symptoms: Secondary | ICD-10-CM

## 2019-04-24 DIAGNOSIS — R972 Elevated prostate specific antigen [PSA]: Secondary | ICD-10-CM | POA: Diagnosis not present

## 2019-04-24 DIAGNOSIS — R351 Nocturia: Secondary | ICD-10-CM | POA: Diagnosis not present

## 2019-05-14 DIAGNOSIS — Z6831 Body mass index (BMI) 31.0-31.9, adult: Secondary | ICD-10-CM | POA: Diagnosis not present

## 2019-05-14 DIAGNOSIS — E119 Type 2 diabetes mellitus without complications: Secondary | ICD-10-CM | POA: Diagnosis not present

## 2019-05-14 DIAGNOSIS — Z299 Encounter for prophylactic measures, unspecified: Secondary | ICD-10-CM | POA: Diagnosis not present

## 2019-05-14 DIAGNOSIS — E7800 Pure hypercholesterolemia, unspecified: Secondary | ICD-10-CM | POA: Diagnosis not present

## 2019-05-14 DIAGNOSIS — E1165 Type 2 diabetes mellitus with hyperglycemia: Secondary | ICD-10-CM | POA: Diagnosis not present

## 2019-05-14 DIAGNOSIS — I10 Essential (primary) hypertension: Secondary | ICD-10-CM | POA: Diagnosis not present

## 2019-06-13 ENCOUNTER — Other Ambulatory Visit: Payer: Self-pay | Admitting: Psychiatry

## 2019-07-03 ENCOUNTER — Encounter: Payer: Self-pay | Admitting: Urology

## 2019-08-14 ENCOUNTER — Ambulatory Visit (INDEPENDENT_AMBULATORY_CARE_PROVIDER_SITE_OTHER): Payer: Medicare HMO | Admitting: Urology

## 2019-08-14 ENCOUNTER — Encounter: Payer: Self-pay | Admitting: Urology

## 2019-08-14 ENCOUNTER — Other Ambulatory Visit: Payer: Self-pay

## 2019-08-14 VITALS — BP 151/75 | HR 76 | Temp 96.5°F | Ht 75.0 in | Wt 240.0 lb

## 2019-08-14 DIAGNOSIS — N400 Enlarged prostate without lower urinary tract symptoms: Secondary | ICD-10-CM | POA: Diagnosis not present

## 2019-08-14 LAB — POCT URINALYSIS DIPSTICK
Bilirubin, UA: NEGATIVE
Glucose, UA: POSITIVE — AB
Ketones, UA: NEGATIVE
Leukocytes, UA: NEGATIVE
Nitrite, UA: NEGATIVE
Protein, UA: NEGATIVE
Spec Grav, UA: 1.025 (ref 1.010–1.025)
Urobilinogen, UA: NEGATIVE E.U./dL — AB
pH, UA: 6 (ref 5.0–8.0)

## 2019-08-14 NOTE — Progress Notes (Signed)
Urological Symptom Review  Patient is experiencing the following symptoms: Frequency Getting up at night   Review of Systems  Gastrointestinal (upper)  : Negative for upper GI symptoms  Gastrointestinal (lower) : Negative for lower GI symptoms  Constitutional : Negative for symptoms  Skin: Negative for skin symptoms  Eyes: Negative for eye symptoms  Ear/Nose/Throat : Negative for Ear/Nose/Throat symptoms  Hematologic/Lymphatic: Negative for Hematologic/Lymphatic symptoms  Cardiovascular : Negative for cardiovascular symptoms  Respiratory : Negative for respiratory symptoms  Endocrine: Negative for endocrine symptoms  Musculoskeletal: Negative for musculoskeletal symptoms  Neurological: Negative for neurological symptoms  Psychologic: Negative for psychiatric symptoms

## 2019-08-14 NOTE — Progress Notes (Signed)
H&P  Chief Complaint: BPH w/ LUTS  History of Present Illness:   2.2.2021: Here today for follow-up. He has been on tamsulosin in the interim and notes that this has improved his urinary pattern and sx's quite a bit. He does note that he has been having to double void recently, where he will urinate while sitting, stand, and find himself having urinate with a fairly large volume. He thinks this is new to him. He does still note what he would consider a high day and nighttime frequency (though diminished from last visit). Now having 2x nocturia. He has not made any efforts to reduce sodium intake.  IPSS Questionnaire (AUA-7): Over the past month.   1)  How often have you had a sensation of not emptying your bladder completely after you finish urinating?  2 - Less than half the time  2)  How often have you had to urinate again less than two hours after you finished urinating? 2 - Less than half the time  3)  How often have you found you stopped and started again several times when you urinated?  1 - Less than 1 time in 5  4) How difficult have you found it to postpone urination?  3 - About half the time  5) How often have you had a weak urinary stream?  2 - Less than half the time  6) How often have you had to push or strain to begin urination?  1 - Less than 1 time in 5  7) How many times did you most typically get up to urinate from the time you went to bed until the time you got up in the morning?  2 - 2 times  Total score:  0-7 mildly symptomatic   8-19 moderately symptomatic   20-35 severely symptomatic   QoL score 3   (below copied from [facility] records):  BPH:  Joseph Banks is a 74 year-old male established patient who is here for follow up regarding further evaluation of BPH and lower urinary tract symptoms.  The patient complains of lower urinary tract symptom(s) that include frequency, urgency, weak stream, intermittency, straining, nocturia, and sense of incomplete emptying. The  patient states his most bothersome symptom(s) are the following: nocturia. His symptoms have been worse over the last year. The patient states if he were to spend the rest of his life with his current urinary condition, he would be mixed.   8.27.2019: On avodart for about 1.5 years. Had TURP 2013.  Current IPSS 6  QOL score 4   2.11.2020: He is now off of Avodart. Most recent PSA is 6 which is appropriate for him being off of that. He still has nocturia x3 but has not managed his behavior i.e. decreasing fluids at night, limiting sodium or wearing compression socks   10.13.2020: He returns today for follow-up now reporting significantly worsened urinary sx's (IPSS 6 --> 25). He notes that his stream is significantly weaker and that he is still very bothered by his nighttime frequency. When he does urinate at night, he feels like he is unable to completely empty without straining and standing up to adjust position -- he is unsure if the volume is significantly different than his daytime urinary volume. He does not limit his sodium intake or afternoon/evening fluid intake. He is also a type II diabetic and notes that he is often woken up by "tingling in [his] feat." He notes that even when his bladder is full his stream is  still quite weak at all points of his stream.  Elevated PSA:   He has had 3 prior TRUS/biopsies. The first was in about 2005. Done by 3 separate urologists--in [place], [place] and [place]. Prior to biopsise PSA range was 9 to 11.   2.11.2020: Most recent PSA 6.0. He is now off of Avodart.   10.13.2020: His most recent PSA was 6.82.    03/22/19 06/11/18 05/05/18 05/31/17 08/05/16 05/28/16 02/19/14  PSA  Total PSA 6.82 ng/dl 6.0 ng/dl 3.7 ng/dl 4.46 ng/dl 8.84 ng/dl 11.5 ng/dl 7.4 ng/dl  % Free PSA   21.6 %        History reviewed. No pertinent past medical history.  History reviewed. No pertinent surgical history.  Home Medications:  Allergies as of 08/14/2019    No Known Allergies     Medication List       Accurate as of August 14, 2019 11:08 AM. If you have any questions, ask your nurse or doctor.        DULoxetine 30 MG capsule Commonly known as: CYMBALTA Take 1 capsule (30 mg total) by mouth daily.   gabapentin 600 MG tablet Commonly known as: NEURONTIN Take 1 tablet (600 mg total) by mouth 2 (two) times daily.   lithium 600 MG capsule TAKE 1 CAPSULE BY MOUTH IN THE EVENING   metFORMIN 500 MG tablet Commonly known as: GLUCOPHAGE Take 500 mg by mouth 2 (two) times daily.   Potassium 99 MG Tabs Take by mouth.   pregabalin 100 MG capsule Commonly known as: LYRICA Take 100 mg by mouth daily.   traZODone 50 MG tablet Commonly known as: DESYREL TAKE 1 TABLET BY MOUTH ONCE DAILY AT NIGHT       Allergies: No Known Allergies  History reviewed. No pertinent family history.  Social History:  reports that he has quit smoking. He has never used smokeless tobacco. No history on file for alcohol and drug.  ROS: A complete review of systems was performed.  All systems are negative except for pertinent findings as noted.  Physical Exam:  Vital signs in last 24 hours: BP (!) 151/75   Pulse 76   Temp (!) 96.5 F (35.8 C)   Ht 6\' 3"  (1.905 m)   Wt 240 lb (108.9 kg)   BMI 30.00 kg/m  Constitutional:  Alert and oriented, No acute distress. Significant pitting edema. Cardiovascular: Regular rate  Respiratory: Normal respiratory effort GI: Abdomen is soft, nontender, nondistended, no abdominal masses. No CVAT.  Genitourinary: Normal male phallus, testes are descended bilaterally and non-tender and without masses, scrotum is normal in appearance without lesions or masses, perineum is normal on inspection. Lymphatic: No lymphadenopathy Neurologic: Grossly intact, no focal deficits Psychiatric: Normal mood and affect  Laboratory Data:  No results for input(s): WBC, HGB, HCT, PLT in the last 72 hours.  No results for input(s):  NA, K, CL, GLUCOSE, BUN, CALCIUM, CREATININE in the last 72 hours.  Invalid input(s): CO3   Results for orders placed or performed in visit on 08/14/19 (from the past 24 hour(s))  POCT urinalysis dipstick     Status: Abnormal   Collection Time: 08/14/19 10:30 AM  Result Value Ref Range   Color, UA yellow    Clarity, UA clear    Glucose, UA Positive (A) Negative   Bilirubin, UA neg    Ketones, UA neg    Spec Grav, UA 1.025 1.010 - 1.025   Blood, UA trace    pH, UA  6.0 5.0 - 8.0   Protein, UA Negative Negative   Urobilinogen, UA negative (A) 0.2 or 1.0 E.U./dL   Nitrite, UA neg    Leukocytes, UA Negative Negative   Appearance clear    Odor      I have reviewed prior pt notes  I have reviewed urinalysis results  I have reviewed prior PSA results   Renal Function: No results for input(s): CREATININE in the last 168 hours. CrCl cannot be calculated (No successful lab value found.).  Radiologic Imaging: No results found.  Impression/Assessment:  Significant improvement on tamsulosin. He still c/o 2x nocturia (improved from last visit, which is likely polyuria secondary to significant lower extremity edema. If he decreases his sodium intake and begins wearing compression garments, this may improve.  Plan:  1. Advised to decrease evening fluid intake, decrease sodium in diet, and to start wearing compression garments.   2. Continue on tamsulosin -- at this point, he shouldn't try increasing his dose before trying the non-medical management we discussed.   3. Return for 1 yr follow-up.   Cc: Dr Woody Seller

## 2019-08-21 DIAGNOSIS — Z125 Encounter for screening for malignant neoplasm of prostate: Secondary | ICD-10-CM | POA: Diagnosis not present

## 2019-08-21 DIAGNOSIS — E1165 Type 2 diabetes mellitus with hyperglycemia: Secondary | ICD-10-CM | POA: Diagnosis not present

## 2019-08-21 DIAGNOSIS — E1140 Type 2 diabetes mellitus with diabetic neuropathy, unspecified: Secondary | ICD-10-CM | POA: Diagnosis not present

## 2019-08-21 DIAGNOSIS — Z1211 Encounter for screening for malignant neoplasm of colon: Secondary | ICD-10-CM | POA: Diagnosis not present

## 2019-08-21 DIAGNOSIS — E7800 Pure hypercholesterolemia, unspecified: Secondary | ICD-10-CM | POA: Diagnosis not present

## 2019-08-21 DIAGNOSIS — Z7189 Other specified counseling: Secondary | ICD-10-CM | POA: Diagnosis not present

## 2019-08-21 DIAGNOSIS — Z79899 Other long term (current) drug therapy: Secondary | ICD-10-CM | POA: Diagnosis not present

## 2019-08-21 DIAGNOSIS — R5383 Other fatigue: Secondary | ICD-10-CM | POA: Diagnosis not present

## 2019-08-21 DIAGNOSIS — Z0000 Encounter for general adult medical examination without abnormal findings: Secondary | ICD-10-CM | POA: Diagnosis not present

## 2019-08-21 DIAGNOSIS — Z1339 Encounter for screening examination for other mental health and behavioral disorders: Secondary | ICD-10-CM | POA: Diagnosis not present

## 2019-08-21 DIAGNOSIS — Z1331 Encounter for screening for depression: Secondary | ICD-10-CM | POA: Diagnosis not present

## 2019-08-21 DIAGNOSIS — Z6830 Body mass index (BMI) 30.0-30.9, adult: Secondary | ICD-10-CM | POA: Diagnosis not present

## 2019-08-24 ENCOUNTER — Other Ambulatory Visit: Payer: Self-pay

## 2019-09-05 ENCOUNTER — Other Ambulatory Visit: Payer: Self-pay | Admitting: Psychiatry

## 2019-09-05 MED ORDER — GABAPENTIN 600 MG PO TABS: 600.0000 mg | ORAL_TABLET | Freq: Two times a day (BID) | ORAL | 0 refills | Status: DC

## 2019-09-17 ENCOUNTER — Ambulatory Visit (INDEPENDENT_AMBULATORY_CARE_PROVIDER_SITE_OTHER): Payer: Medicare HMO | Admitting: Psychiatry

## 2019-09-17 ENCOUNTER — Encounter: Payer: Self-pay | Admitting: Psychiatry

## 2019-09-17 DIAGNOSIS — Z79899 Other long term (current) drug therapy: Secondary | ICD-10-CM

## 2019-09-17 DIAGNOSIS — F3181 Bipolar II disorder: Secondary | ICD-10-CM

## 2019-09-17 DIAGNOSIS — F5105 Insomnia due to other mental disorder: Secondary | ICD-10-CM | POA: Diagnosis not present

## 2019-09-17 DIAGNOSIS — R69 Illness, unspecified: Secondary | ICD-10-CM | POA: Diagnosis not present

## 2019-09-17 DIAGNOSIS — E0841 Diabetes mellitus due to underlying condition with diabetic mononeuropathy: Secondary | ICD-10-CM | POA: Diagnosis not present

## 2019-09-17 DIAGNOSIS — F411 Generalized anxiety disorder: Secondary | ICD-10-CM

## 2019-09-17 NOTE — Progress Notes (Signed)
YANIXAN GRONOWSKI [id number] 07-15-1945 74 y.o.   Virtual Visit via [place]  I connected with pt by WebEx and verified that I am speaking with the correct person using two identifiers.   I discussed the limitations, risks, security and privacy concerns of performing an evaluation and management service by [facility] and the availability of in person appointments. I also discussed with the patient that there may be a patient responsible charge related to this service. The patient expressed understanding and agreed to proceed.  I discussed the assessment and treatment plan with the patient. The patient was provided an opportunity to ask questions and all were answered. The patient agreed with the plan and demonstrated an understanding of the instructions.   The patient was advised to call back or seek an in-person evaluation if the symptoms worsen or if the condition fails to improve as anticipated.  I provided 30 minutes of video time during this encounter. The call started at 1100 and ended at 11:30. The patient was located at home and the provider was located office.    Subjective:   Patient ID:  Joseph Banks is a 74 y.o. (DOB [birth year]) male.  Chief Complaint:  Chief Complaint  Patient presents with  . Sleeping Problem  . Follow-up    anxity and depression    Anxiety Symptoms include dizziness. Patient reports no confusion, decreased concentration, nervous/anxious behavior or suicidal ideas.     Joseph Banks presents to the office today for follow-up of depression and anxiety.    In 2019 August.   Changed lithium slightly to 600 mg and increased the gabapentin to 600 BID.    visit in March he was doing well and was more compliant with medication with a good response.  No meds were changed.  Last seen September without  Med changes.  Main problems is disrupted sleep with some awakening and frequent periods of staying awake for a little while thereafter.  No depression but  don't sleep well with nocturia and neuropathy with toes tingling.  PCP prescribed Lyrica for neuropathy.  Tingling is very uncomfortable at times.  Not taking any gabapentin in pm.  A little dizzy and nausea after morning meds when takes most everything.  This has improved compliance.  Increase in gabapentin helped the neuropathic tingling in R foot. Plays pickle ball regularly and tries to stay active. Mood good.  No depressive episodes.  Patient reports stable mood and denies depressed or irritable moods.  Patient denies any recent difficulty with anxiety.  Patient denies difficulty with sleep initiation but is interrrupted with normal quantity. Denies appetite disturbance.  Patient reports that energy and motivation have been good.  Patient denies any difficulty with concentration.  Patient denies any suicidal ideation.  Past Psychiatric Medication Trials: Citalopram no response paroxetine 40 with benefit, mirtazapine, duloxetine 90+ lithium with response, aripiprazole 10, lithium 900, trazodone with benefit, alprazolam, buspirone  Review of Systems:  Review of Systems  Genitourinary:       Nocturia  Neurological: Positive for dizziness. Negative for tremors and weakness.       Tingling in toes  Psychiatric/Behavioral: Negative for agitation, behavioral problems, confusion, decreased concentration, dysphoric mood, hallucinations, self-injury, sleep disturbance and suicidal ideas. The patient is not nervous/anxious and is not hyperactive.     Medications: I have reviewed the patient's current medications.  Current Outpatient Medications  Medication Sig Dispense Refill  . DULoxetine (CYMBALTA) 30 MG capsule Take 1 capsule (30 mg total) by mouth daily. [place]  capsule 1  . gabapentin (NEURONTIN) 600 MG tablet Take 1 tablet (600 mg total) by mouth 2 (two) times daily. (Patient taking differently: Take 600 mg by mouth 2 (two) times daily. Only taking AM) 180 tablet 0  . lithium 600 MG capsule TAKE  1 CAPSULE BY MOUTH IN THE EVENING 90 capsule 1  . metFORMIN (GLUCOPHAGE) 500 MG tablet Take 500 mg by mouth 2 (two) times daily.    . Potassium 99 MG TABS Take by mouth.    . pregabalin (LYRICA) 100 MG capsule Take 100 mg by mouth daily.    . traZODone (DESYREL) 50 MG tablet TAKE 1 TABLET BY MOUTH ONCE DAILY AT NIGHT 180 tablet 1   No current facility-administered medications for this visit.    Medication Side Effects: None  Allergies: No Known Allergies  History reviewed. No pertinent past medical history.  History reviewed. No pertinent family history.  Social History   Socioeconomic History  . Marital status: Married    Spouse name: Not on file  . Number of children: Not on file  . Years of education: Not on file  . Highest education level: Not on file  Occupational History  . Not on file  Tobacco Use  . Smoking status: Former Research scientist (life sciences)  . Smokeless tobacco: Never Used  Substance and Sexual Activity  . Alcohol use: Not on file  . Drug use: Not on file  . Sexual activity: Not on file  Other Topics Concern  . Not on file  Social History Narrative  . Not on file   Social Determinants of Health   Financial Resource Strain:   . Difficulty of Paying Living Expenses: Not on file  Food Insecurity:   . Worried About Charity fundraiser in the Last Year: Not on file  . Ran Out of Food in the Last Year: Not on file  Transportation Needs:   . Lack of Transportation (Medical): Not on file  . Lack of Transportation (Non-Medical): Not on file  Physical Activity:   . Days of Exercise per Week: Not on file  . Minutes of Exercise per Session: Not on file  Stress:   . Feeling of Stress : Not on file  Social Connections:   . Frequency of Communication with Friends and Family: Not on file  . Frequency of Social Gatherings with Friends and Family: Not on file  . Attends Religious Services: Not on file  . Active Member of Clubs or Organizations: Not on file  . Attends Theatre manager Meetings: Not on file  . Marital Status: Not on file  Intimate Partner Violence:   . Fear of Current or Ex-Partner: Not on file  . Emotionally Abused: Not on file  . Physically Abused: Not on file  . Sexually Abused: Not on file    Past Medical History, Surgical history, Social history, and Family history were reviewed and updated as appropriate.   Please see review of systems for further details on the patient's review from today.   Objective:   Physical Exam:  There were no vitals taken for this visit.  Physical Exam Neurological:     Mental Status: He is alert and oriented to person, place, and time.     Cranial Nerves: No dysarthria.  Psychiatric:        Attention and Perception: Attention normal. He is attentive. He does not perceive auditory hallucinations.        Mood and Affect: Mood normal. Mood is not anxious or  depressed.        Speech: Speech normal.        Behavior: Behavior is cooperative.        Thought Content: Thought content normal. Thought content is not paranoid or delusional. Thought content does not include homicidal or suicidal ideation. Thought content does not include homicidal or suicidal plan.        Cognition and Memory: Cognition and memory normal.        Judgment: Judgment normal.     Comments: Insight fair.  Historically this is been a problem     Lab Review:  No results found for: NA, K, CL, CO2, GLUCOSE, BUN, CREATININE, CALCIUM, PROT, ALBUMIN, AST, ALT, ALKPHOS, BILITOT, GFRNONAA, GFRAA  No results found for: WBC, RBC, HGB, HCT, PLT, MCV, MCH, MCHC, RDW, LYMPHSABS, MONOABS, EOSABS, BASOSABS  No results found for: POCLITH, LITHIUM   No results found for: PHENYTOIN, PHENOBARB, VALPROATE, CBMZ   Labs from August 21, 2018 included BMP which was normal including calcium 9.4 and creatinine 1.2, TSH 1.85 normal, lithium 0.68 stable  .res Assessment: Plan:    Bipolar II disorder ([place]) - Plan: Lithium level, Basic metabolic  panel, TSH  Generalized anxiety disorder  Insomnia due to mental condition  Lithium use - Plan: Lithium level, Basic metabolic panel, TSH  Diabetic mononeuropathy associated with diabetes mellitus due to underlying condition (HCC)   Greater than 50% of 30 min of webex time wiith patient was spent on counseling and coordination of care. We discussed Importance of consistency emphasized bc history or problems causing relapse.    Simplifying the medication has helped compliant and he is more stable.  He is tolerating medications well.  Discussed the risk that antidepressants may cause mood cycling and mania but he appears to need it to manage the anxiety.  The lithium is working adequately.  He has had no significant mood swings or depressive bouts since he was last here.  He satisfied with the medications except for questions about gabapentin and Lyrica.  Counseled patient regarding potential benefits, risks, and side effects of lithium to include potential risk of lithium affecting thyroid and renal function.  Discussed need for periodic lab monitoring to determine drug level and to assess for potential adverse effects.  Counseled patient regarding signs and symptoms of lithium toxicity and advised that they notify office immediately or seek urgent medical attention if experiencing these signs and symptoms.  Patient advised to contact office with any questions or concerns.  The labs were good in February including BMP and TSH.  Continue lithium 600 mg nightly rec lithium level as soon as possible.  Gabapentin versus Lyrica was discussed in detail for neuropathy as well as off label use for anxiety.  He had experienced benefit from the gabapentin in the past but his neuropathy has gotten worse which he attributes to his diabetes.  His other doctor has started a low dose of Lyrica but did not recommend changing the gabapentin dosage.   Discussed the risk of a flareup of anxiety and doing so.Disc potential  additive SE with gabapentin and Lyrica.  Disc potential balance issues and be careful getting up at night. Since his neuropathy is not manage he needs to return to the previous dosage.  Start  600 mg twice daily to help neuropathy at night.    Trazodone is managing the insomnia except for nocturia and neuropathy in general.  Discussed the use of melatonin supplements which are acceptable.  Discussed risks of sedatives including  fall risk at night. Disc option of nocturia meds, he''ll disc with urologist.  History of severe anxiety has been managed with medication.  Both the duloxetine and gabapentin are assumed to be helping.  No med changes indicated.  This was a 30-minute appointment  Follow-up 6 months.  Lynder Parents, MD, DFAPA   Please see After Visit Summary for patient specific instructions.  Future Appointments  Date Time Provider [place]  08/19/2020 10:15 AM Franchot Gallo, MD [facility] None    Orders Placed This Encounter  Procedures  . Lithium level  . Basic metabolic panel  . TSH      -------------------------------

## 2019-11-27 DIAGNOSIS — I10 Essential (primary) hypertension: Secondary | ICD-10-CM | POA: Diagnosis not present

## 2019-11-27 DIAGNOSIS — Z299 Encounter for prophylactic measures, unspecified: Secondary | ICD-10-CM | POA: Diagnosis not present

## 2019-11-27 DIAGNOSIS — E1165 Type 2 diabetes mellitus with hyperglycemia: Secondary | ICD-10-CM | POA: Diagnosis not present

## 2019-11-27 DIAGNOSIS — R69 Illness, unspecified: Secondary | ICD-10-CM | POA: Diagnosis not present

## 2019-12-21 ENCOUNTER — Other Ambulatory Visit: Payer: Self-pay | Admitting: Psychiatry

## 2019-12-26 ENCOUNTER — Telehealth: Payer: Self-pay | Admitting: Psychiatry

## 2019-12-26 NOTE — Telephone Encounter (Signed)
He has trazodone 50 mg nightly.  He can increase that to 100 to 150 mg nightly.  If that does not work then he needs to schedule an earlier appointment to evaluate the cause of his insomnia.  Stop all caffeine after noon.

## 2019-12-26 NOTE — Telephone Encounter (Signed)
Patient called you back  And if you will call him back [phone]

## 2019-12-26 NOTE — Telephone Encounter (Signed)
Patient called and said that he is not sleeping at all and has a bad case of insomnia. He would like a script to help him sleep to be sent to [facility] [place]

## 2019-12-26 NOTE — Telephone Encounter (Signed)
LM to increase Trazodone to 100 mg-150 mg at hs. Stop all caffeine after noon. If not helping call back to scheduled earlier apt.

## 2020-01-16 DIAGNOSIS — R69 Illness, unspecified: Secondary | ICD-10-CM | POA: Diagnosis not present

## 2020-03-11 DIAGNOSIS — E1165 Type 2 diabetes mellitus with hyperglycemia: Secondary | ICD-10-CM | POA: Diagnosis not present

## 2020-03-11 DIAGNOSIS — R69 Illness, unspecified: Secondary | ICD-10-CM | POA: Diagnosis not present

## 2020-03-11 DIAGNOSIS — Z299 Encounter for prophylactic measures, unspecified: Secondary | ICD-10-CM | POA: Diagnosis not present

## 2020-03-11 DIAGNOSIS — D692 Other nonthrombocytopenic purpura: Secondary | ICD-10-CM | POA: Diagnosis not present

## 2020-03-11 DIAGNOSIS — I10 Essential (primary) hypertension: Secondary | ICD-10-CM | POA: Diagnosis not present

## 2020-03-24 ENCOUNTER — Encounter: Payer: Self-pay | Admitting: Psychiatry

## 2020-03-24 ENCOUNTER — Telehealth (INDEPENDENT_AMBULATORY_CARE_PROVIDER_SITE_OTHER): Payer: Medicare HMO | Admitting: Psychiatry

## 2020-03-24 DIAGNOSIS — F5105 Insomnia due to other mental disorder: Secondary | ICD-10-CM

## 2020-03-24 DIAGNOSIS — R69 Illness, unspecified: Secondary | ICD-10-CM | POA: Diagnosis not present

## 2020-03-24 DIAGNOSIS — F3181 Bipolar II disorder: Secondary | ICD-10-CM

## 2020-03-24 DIAGNOSIS — E0841 Diabetes mellitus due to underlying condition with diabetic mononeuropathy: Secondary | ICD-10-CM | POA: Diagnosis not present

## 2020-03-24 DIAGNOSIS — F411 Generalized anxiety disorder: Secondary | ICD-10-CM

## 2020-03-24 MED ORDER — TRAZODONE HCL 50 MG PO TABS: 50.0000 mg | ORAL_TABLET | Freq: Every evening | ORAL | 1 refills | Status: DC | PRN

## 2020-03-24 MED ORDER — GABAPENTIN 600 MG PO TABS: 600.0000 mg | ORAL_TABLET | Freq: Two times a day (BID) | ORAL | 1 refills | Status: DC

## 2020-03-24 NOTE — Progress Notes (Signed)
Joseph Banks [id number] 02-Jul-1946 74 y.o.   Virtual Visit via [facility]  I connected with pt by WebEx and verified that I am speaking with the correct person using two identifiers.   I discussed the limitations, risks, security and privacy concerns of performing an evaluation and management service by [facility] and the availability of in person appointments. I also discussed with the patient that there may be a patient responsible charge related to this service. The patient expressed understanding and agreed to proceed.  I discussed the assessment and treatment plan with the patient. The patient was provided an opportunity to ask questions and all were answered. The patient agreed with the plan and demonstrated an understanding of the instructions.   The patient was advised to call back or seek an in-person evaluation if the symptoms worsen or if the condition fails to improve as anticipated.  I provided 30 minutes of video time during this encounter. The call started at 1000 and ended at 10:30. The patient was located at home and the provider was located office.    Subjective:   Patient ID:  Joseph Banks is a 74 y.o. (DOB 1945-09-21) male.  Chief Complaint:  Chief Complaint  Patient presents with  . Follow-up    Medication Management  . Other    Bipolar 2  . Medication Refill    Gabapentin, Trazodone,   . Sleeping Problem    Anxiety Symptoms include dizziness. Patient reports no confusion, decreased concentration, nervous/anxious behavior or suicidal ideas.    Medication Refill Pertinent negatives include no weakness.   Joseph Banks presents to the office today for follow-up of depression and anxiety.    In 2019 August.   Changed lithium slightly to 600 mg and increased the gabapentin to 600 BID.    visit in March 2020 he was doing well and was more compliant with medication with a good response.  No meds were changed.  seen September2020 without  Med changes.  Main  problems is disrupted sleep with some awakening and frequent periods of staying awake for a little while thereafter.  No depression but don't sleep well with nocturia and neuropathy with toes tingling.  PCP prescribed Lyrica for neuropathy.  Tingling is very uncomfortable at times.  Not taking any gabapentin in pm.  A little dizzy and nausea after morning meds when takes most everything.  09/2019 appt was well emotionally but sleep problems and neuropathy.   Plan:Since his neuropathy is not manage he needs to return to the previous dosage.  Start  600 mg twice daily to help neuropathy at night.   Check lihtium level.  03/24/20 appt with the following noted: Never got lithium level and stopped psych meds lithium and duloxetine AMA. "I had so many pills I was taking" for DM and prostate and decided he wanted to stop things.  Stopped April 1.  Denies depression or mood swings.  CO nocturia. Had called and increased trazodone for awhile. Taking gabapentin and pregabalin.  Mood good.  No depressive episodes.  Patient reports stable mood and denies depressed or irritable moods.  Patient denies any recent difficulty with anxiety.  Patient denies difficulty with sleep initiation but is interrrupted with normal quantity. Denies appetite disturbance.  Patient reports that energy and motivation have been good.  Patient denies any difficulty with concentration.  Patient denies any suicidal ideation.  Past Psychiatric Medication Trials: Citalopram no response paroxetine 40 with benefit, mirtazapine, duloxetine 90+ lithium with response, aripiprazole 10, lithium 900,  trazodone with benefit, alprazolam, buspirone  Review of Systems:  Review of Systems  Genitourinary: Positive for frequency.       Nocturia  Neurological: Positive for dizziness. Negative for tremors and weakness.       Tingling in toes  Psychiatric/Behavioral: Negative for agitation, behavioral problems, confusion, decreased concentration,  dysphoric mood, hallucinations, self-injury, sleep disturbance and suicidal ideas. The patient is not nervous/anxious and is not hyperactive.     Medications: I have reviewed the patient's current medications.  Current Outpatient Medications  Medication Sig Dispense Refill  . gabapentin (NEURONTIN) 600 MG tablet Take 1 tablet (600 mg total) by mouth 2 (two) times daily. (Patient taking differently: Take 600 mg by mouth 2 (two) times daily. Only taking AM) 180 tablet 0  . metFORMIN (GLUCOPHAGE) 500 MG tablet Take 500 mg by mouth 2 (two) times daily.    . Potassium 99 MG TABS Take by mouth.    . pregabalin (LYRICA) 100 MG capsule Take 100 mg by mouth daily.    . tamsulosin (FLOMAX) 0.4 MG CAPS capsule Take 0.4 mg by mouth.    . traZODone (DESYREL) 50 MG tablet TAKE 1 TABLET BY MOUTH ONCE DAILY AT NIGHT 180 tablet 0  . DULoxetine (CYMBALTA) 30 MG capsule Take 1 capsule (30 mg total) by mouth daily. (Patient not taking: Reported on 03/24/2020) 90 capsule 1  . lithium 600 MG capsule TAKE 1 CAPSULE BY MOUTH IN THE EVENING (Patient not taking: Reported on 03/24/2020) 90 capsule 1   No current facility-administered medications for this visit.    Medication Side Effects: None  Allergies: No Known Allergies  History reviewed. No pertinent past medical history.  History reviewed. No pertinent family history.  Social History   Socioeconomic History  . Marital status: Married    Spouse name: Not on file  . Number of children: Not on file  . Years of education: Not on file  . Highest education level: Not on file  Occupational History  . Not on file  Tobacco Use  . Smoking status: Former Research scientist (life sciences)  . Smokeless tobacco: Never Used  Substance and Sexual Activity  . Alcohol use: Not on file  . Drug use: Not on file  . Sexual activity: Not on file  Other Topics Concern  . Not on file  Social History Narrative  . Not on file   Social Determinants of Health   Financial Resource Strain:   .  Difficulty of Paying Living Expenses: Not on file  Food Insecurity:   . Worried About Charity fundraiser in the Last Year: Not on file  . Ran Out of Food in the Last Year: Not on file  Transportation Needs:   . Lack of Transportation (Medical): Not on file  . Lack of Transportation (Non-Medical): Not on file  Physical Activity:   . Days of Exercise per Week: Not on file  . Minutes of Exercise per Session: Not on file  Stress:   . Feeling of Stress : Not on file  Social Connections:   . Frequency of Communication with Friends and Family: Not on file  . Frequency of Social Gatherings with Friends and Family: Not on file  . Attends Religious Services: Not on file  . Active Member of Clubs or Organizations: Not on file  . Attends Archivist Meetings: Not on file  . Marital Status: Not on file  Intimate Partner Violence:   . Fear of Current or Ex-Partner: Not on file  . Emotionally  Abused: Not on file  . Physically Abused: Not on file  . Sexually Abused: Not on file    Past Medical History, Surgical history, Social history, and Family history were reviewed and updated as appropriate.   Please see review of systems for further details on the patient's review from today.   Objective:   Physical Exam:  There were no vitals taken for this visit.  Physical Exam Neurological:     Mental Status: He is alert and oriented to person, place, and time.     Cranial Nerves: No dysarthria.  Psychiatric:        Attention and Perception: Attention normal. He is attentive. He does not perceive auditory hallucinations.        Mood and Affect: Mood normal. Mood is not anxious or depressed.        Speech: Speech normal. Speech is not rapid and pressured.        Behavior: Behavior is cooperative.        Thought Content: Thought content normal. Thought content is not paranoid or delusional. Thought content does not include homicidal or suicidal ideation. Thought content does not include  homicidal or suicidal plan.        Cognition and Memory: Cognition and memory normal.        Judgment: Judgment normal.     Comments: Insight fair.  Historically this is been a problem including now.  Refuses psych meds bc no sx currently.     Lab Review:  No results found for: NA, K, CL, CO2, GLUCOSE, BUN, CREATININE, CALCIUM, PROT, ALBUMIN, AST, ALT, ALKPHOS, BILITOT, GFRNONAA, GFRAA  No results found for: WBC, RBC, HGB, HCT, PLT, MCV, MCH, MCHC, RDW, LYMPHSABS, MONOABS, EOSABS, BASOSABS  No results found for: POCLITH, LITHIUM   No results found for: PHENYTOIN, PHENOBARB, VALPROATE, CBMZ   Labs from August 21, 2018 included BMP which was normal including calcium 9.4 and creatinine 1.2, TSH 1.85 normal, lithium 0.68 stable  .res Assessment: Plan:    Bipolar II disorder ([place])  Generalized anxiety disorder  Insomnia due to mental condition  Diabetic mononeuropathy associated with diabetes mellitus due to underlying condition ([place])   Greater than 50% of 30 min of webex time wiith patient was spent on counseling and coordination of care. We discussed Importance of consistency emphasized bc history or problems causing relapse.     He has had no significant mood swings or depressive bouts since he was last here.  He satisfied with the medications except for questions about gabapentin and Lyrica.  Counseled patient regarding potential benefits, risks, and side effects of lithium to include potential risk of lithium affecting thyroid and renal function.  Discussed need for periodic lab monitoring to determine drug level and to assess for potential adverse effects.  Counseled patient regarding signs and symptoms of lithium toxicity and advised that they notify office immediately or seek urgent medical attention if experiencing these signs and symptoms.  Patient advised to contact office with any questions or concerns.  The labs were good in February including BMP and TSH.  Continue lithium  600 mg nightly rec lithium level as soon as possible. Refuses psych meds bc no sx currently. Disc risk relapse without them.  Call early if this occurs.  Gabapentin versus Lyrica was discussed in detail for neuropathy as well as off label use for anxiety.  He had experienced benefit from the gabapentin in the past but his neuropathy has gotten worse which he attributes to his diabetes.  His other doctor has started a low dose of Lyrica but did not recommend changing the gabapentin dosage.   Discussed the risk of a flareup of anxiety and doing so.Disc potential additive SE with gabapentin and Lyrica.  Disc potential balance issues and be careful getting up at night. Rec get PCP to start RX this  Trazodone is managing the insomnia except for nocturia and neuropathy in general.  Discussed the use of melatonin supplements which are acceptable.  Discussed risks of sedatives including fall risk at night. Disc option of nocturia meds, he''ll disc with urologist.  History of severe anxiety had been managed with medication.  Both the duloxetine and gabapentin were assumed to be helping but he is had no relapse off the medication so far.  No med changes indicated.  This was a 30-minute appointment  Follow-up 6 months.  Lynder Parents, MD, DFAPA   Please see After Visit Summary for patient specific instructions.  Future Appointments  Date Time Provider [place]  08/19/2020 10:15 AM Franchot Gallo, MD [facility] None    No orders of the defined types were placed in this encounter.     -------------------------------

## 2020-04-16 DIAGNOSIS — H2701 Aphakia, right eye: Secondary | ICD-10-CM | POA: Diagnosis not present

## 2020-04-16 DIAGNOSIS — H35372 Puckering of macula, left eye: Secondary | ICD-10-CM | POA: Diagnosis not present

## 2020-04-16 DIAGNOSIS — H2512 Age-related nuclear cataract, left eye: Secondary | ICD-10-CM | POA: Diagnosis not present

## 2020-05-14 DIAGNOSIS — L570 Actinic keratosis: Secondary | ICD-10-CM | POA: Diagnosis not present

## 2020-06-25 DIAGNOSIS — I10 Essential (primary) hypertension: Secondary | ICD-10-CM | POA: Diagnosis not present

## 2020-06-25 DIAGNOSIS — L928 Other granulomatous disorders of the skin and subcutaneous tissue: Secondary | ICD-10-CM | POA: Diagnosis not present

## 2020-06-25 DIAGNOSIS — D1801 Hemangioma of skin and subcutaneous tissue: Secondary | ICD-10-CM | POA: Diagnosis not present

## 2020-06-25 DIAGNOSIS — Z23 Encounter for immunization: Secondary | ICD-10-CM | POA: Diagnosis not present

## 2020-06-25 DIAGNOSIS — E1165 Type 2 diabetes mellitus with hyperglycemia: Secondary | ICD-10-CM | POA: Diagnosis not present

## 2020-06-25 DIAGNOSIS — Z299 Encounter for prophylactic measures, unspecified: Secondary | ICD-10-CM | POA: Diagnosis not present

## 2020-06-25 DIAGNOSIS — D485 Neoplasm of uncertain behavior of skin: Secondary | ICD-10-CM | POA: Diagnosis not present

## 2020-06-25 DIAGNOSIS — E7800 Pure hypercholesterolemia, unspecified: Secondary | ICD-10-CM | POA: Diagnosis not present

## 2020-07-16 ENCOUNTER — Other Ambulatory Visit: Payer: Self-pay

## 2020-07-16 DIAGNOSIS — N400 Enlarged prostate without lower urinary tract symptoms: Secondary | ICD-10-CM

## 2020-07-16 MED ORDER — TAMSULOSIN HCL 0.4 MG PO CAPS: 0.4000 mg | ORAL_CAPSULE | Freq: Every day | ORAL | 1 refills | Status: DC

## 2020-08-19 ENCOUNTER — Ambulatory Visit: Payer: Medicare HMO | Admitting: Urology

## 2020-08-20 DIAGNOSIS — E1165 Type 2 diabetes mellitus with hyperglycemia: Secondary | ICD-10-CM | POA: Diagnosis not present

## 2020-08-20 DIAGNOSIS — Z299 Encounter for prophylactic measures, unspecified: Secondary | ICD-10-CM | POA: Diagnosis not present

## 2020-08-20 DIAGNOSIS — I10 Essential (primary) hypertension: Secondary | ICD-10-CM | POA: Diagnosis not present

## 2020-08-20 DIAGNOSIS — R69 Illness, unspecified: Secondary | ICD-10-CM | POA: Diagnosis not present

## 2020-08-20 DIAGNOSIS — E1140 Type 2 diabetes mellitus with diabetic neuropathy, unspecified: Secondary | ICD-10-CM | POA: Diagnosis not present

## 2020-10-10 ENCOUNTER — Telehealth: Payer: Self-pay

## 2020-10-10 NOTE — Telephone Encounter (Signed)
Patient wanting to have a virtual visit or telephone call with Dr. Diona Fanti.  Please call back at:  [phone]  Thanks, Helene Kelp

## 2020-10-13 NOTE — Telephone Encounter (Signed)
Another message sent to doctor through telephone encounter.

## 2020-10-13 NOTE — Telephone Encounter (Signed)
Patient calling back a second time.   He is out of town. Needing a phone or virtual visit to discuss medication.    Please call back at:  [phone]  Thanks, Helene Kelp

## 2020-10-14 NOTE — Telephone Encounter (Signed)
Patient called and given information of how to take tamsulosin. Pt voiced understanding and to be aware dizziness can occur with BID.  New appt for June given to patient. Patient will call back if he needs a new prescription before seeing MD

## 2020-10-14 NOTE — Telephone Encounter (Signed)
From what I can see it has been a year since his patient was seen.  He was supposed to see me in a year for follow-up You can tell them he can double up on the tamsulosin, 1 every 12 hours until he sees me but he needs to be seen for routine check

## 2020-10-27 ENCOUNTER — Ambulatory Visit: Payer: Medicare HMO | Admitting: Psychiatry

## 2020-10-28 ENCOUNTER — Ambulatory Visit: Payer: Medicare HMO | Admitting: Urology

## 2020-10-29 ENCOUNTER — Telehealth: Payer: Self-pay

## 2020-10-29 NOTE — Telephone Encounter (Signed)
Pt called asking if Dr. Diona Fanti would send in a prescription for ED. Pt has not been seen in a year. I explained he would have to wait until office visit to get more prescriptions. Appointment moved up to for pt to next available in June per his request.

## 2020-11-18 ENCOUNTER — Telehealth: Payer: Medicare HMO | Admitting: Urology

## 2020-12-16 ENCOUNTER — Other Ambulatory Visit: Payer: Self-pay

## 2020-12-16 ENCOUNTER — Encounter: Payer: Self-pay | Admitting: Urology

## 2020-12-16 ENCOUNTER — Ambulatory Visit (INDEPENDENT_AMBULATORY_CARE_PROVIDER_SITE_OTHER): Payer: Medicare HMO | Admitting: Urology

## 2020-12-16 VITALS — Ht 75.0 in

## 2020-12-16 DIAGNOSIS — R351 Nocturia: Secondary | ICD-10-CM | POA: Diagnosis not present

## 2020-12-16 DIAGNOSIS — N5201 Erectile dysfunction due to arterial insufficiency: Secondary | ICD-10-CM | POA: Diagnosis not present

## 2020-12-16 DIAGNOSIS — R3912 Poor urinary stream: Secondary | ICD-10-CM | POA: Diagnosis not present

## 2020-12-16 DIAGNOSIS — N400 Enlarged prostate without lower urinary tract symptoms: Secondary | ICD-10-CM

## 2020-12-16 LAB — URINALYSIS, ROUTINE W REFLEX MICROSCOPIC
Bilirubin, UA: NEGATIVE
Ketones, UA: NEGATIVE
Leukocytes,UA: NEGATIVE
Nitrite, UA: NEGATIVE
Protein,UA: NEGATIVE
RBC, UA: NEGATIVE
Specific Gravity, UA: 1.025 (ref 1.005–1.030)
Urobilinogen, Ur: 0.2 mg/dL (ref 0.2–1.0)
pH, UA: 6 (ref 5.0–7.5)

## 2020-12-16 MED ORDER — TADALAFIL 5 MG PO TABS: 5.0000 mg | ORAL_TABLET | Freq: Every day | ORAL | 11 refills | Status: DC

## 2020-12-16 MED ORDER — TAMSULOSIN HCL 0.4 MG PO CAPS: ORAL_CAPSULE | ORAL | 3 refills | Status: DC

## 2020-12-16 NOTE — Progress Notes (Signed)
Urological Symptom Review  Patient is experiencing the following symptoms: Get up at night to urinate Stream starts and stops Trouble starting stream Have to strain to urinate Weak stream Erection problems (male only)   Review of Systems  Gastrointestinal (upper)  : Negative for upper GI symptoms  Gastrointestinal (lower) : Negative for lower GI symptoms  Constitutional : Negative for symptoms  Skin: Negative for skin symptoms  Eyes: Negative for eye symptoms  Ear/Nose/Throat : Negative for Ear/Nose/Throat symptoms  Hematologic/Lymphatic: Negative for Hematologic/Lymphatic symptoms  Cardiovascular : Negative for cardiovascular symptoms  Respiratory : Negative for respiratory symptoms  Endocrine: Negative for endocrine symptoms  Musculoskeletal: Negative for musculoskeletal symptoms  Neurological: Negative for neurological symptoms  Psychologic: Negative for psychiatric symptoms

## 2020-12-16 NOTE — Progress Notes (Signed)
History of Present Illness:   8.27.2019: On avodart for about 1.5 years. Had TURP 2013.  Current IPSS 6  QOL score 4   2.11.2020: He is now off of Avodart. Most recent PSA is 6 which is appropriate for him being off of that. He still has nocturia x3 but has not managed his behavior i.e. decreasing fluids at night, limiting sodium or wearing compression socks   10.13.2020: He returns today for follow-up now reporting significantly worsened urinary sx's (IPSS 6 --> 25). He notes that his stream is significantly weaker and that he is still very bothered by his nighttime frequency. When he does urinate at night, he feels like he is unable to completely empty without straining and standing up to adjust position -- he is unsure if the volume is significantly different than his daytime urinary volume. He does not limit his sodium intake or afternoon/evening fluid intake. He is also a type II diabetic and notes that he is often woken up by "tingling in [his] feet." He notes that even when his bladder is full his stream is still quite weak at all points of his stream.  6.7.2022: He still takes tamsulosin-sometimes 1 a day, sometimes 2.  He does still have bothersome urinary symptoms.  IPSS 21, quality-of-life score 4.  Elevated PSA:   He has had 3 prior TRUS/biopsies. The first was in about 2005. Done by 3 separate urologists--in [place], [place] and [place]. Prior to biopsise PSA range was 9 to 11.   2.11.2020: Most recent PSA 6.0. He is now off of Avodart.   10.13.2020: His most recent PSA was 6.82.   2.9.2021: 9.4   No past medical history on file.  No past surgical history on file.  Home Medications:  Allergies as of 12/16/2020   No Known Allergies     Medication List       Accurate as of December 16, 2020 12:47 PM. If you have any questions, ask your nurse or doctor.        DULoxetine 30 MG capsule Commonly known as: CYMBALTA Take 1 capsule (30 mg total) by mouth daily.    gabapentin 600 MG tablet Commonly known as: NEURONTIN Take 1 tablet (600 mg total) by mouth 2 (two) times daily.   lithium 600 MG capsule TAKE 1 CAPSULE BY MOUTH IN THE EVENING   metFORMIN 500 MG tablet Commonly known as: GLUCOPHAGE Take 500 mg by mouth 2 (two) times daily.   Potassium 99 MG Tabs Take by mouth.   pregabalin 100 MG capsule Commonly known as: LYRICA Take 100 mg by mouth daily.   tamsulosin 0.4 MG Caps capsule Commonly known as: FLOMAX Take 1 capsule (0.4 mg total) by mouth daily.   traZODone 50 MG tablet Commonly known as: DESYREL Take 1-2 tablets (50-100 mg total) by mouth at bedtime as needed for sleep.       Allergies: No Known Allergies  No family history on file.  Social History:  reports that he has quit smoking. He has never used smokeless tobacco. No history on file for alcohol use and drug use.  ROS: A complete review of systems was performed.  All systems are negative except for pertinent findings as noted.  Physical Exam:  Vital signs in last 24 hours: There were no vitals taken for this visit. Constitutional:  Alert and oriented, No acute distress Cardiovascular: Regular rate  Respiratory: Normal respiratory effort GI: Abdomen is soft, nontender, nondistended, no abdominal masses. No CVAT.  Genitourinary: Normal male phallus, testes are descended bilaterally and non-tender and without masses, scrotum is normal in appearance without lesions or masses, perineum is normal on inspection. Lymphatic: No lymphadenopathy Neurologic: Grossly intact, no focal deficits Psychiatric: Normal mood and affect  I have reviewed prior pt notes  I have reviewed notes from referring/previous physicians  I have reviewed urinalysis results  I have reviewed prior PSA results    Impression/Assessment:  1.  BPH with lower urinary tract symptoms.  Significant bother currently with IPSS 21.  He takes either 1 or 2 tamsulosin's a day  2.  Elevated PSA.   History of 3 biopsies with average PSA prior to being on Avodart between 9 and 11.  Off of Avodart, his PSA in February, 2021 was 9.4.  He has a benign but large gland.  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  We will check PSA today  2.  For now, I will have him take tamsulosin 0.4 mg, 2 every day  3.  Prescription for daily Cialis sent-this will hopefully help his urinary symptoms.  If so, he will taper down on his tamsulosin  4.  I will see back in 3 months for recheck

## 2020-12-17 DIAGNOSIS — Z0000 Encounter for general adult medical examination without abnormal findings: Secondary | ICD-10-CM | POA: Diagnosis not present

## 2020-12-17 DIAGNOSIS — Z7189 Other specified counseling: Secondary | ICD-10-CM | POA: Diagnosis not present

## 2020-12-17 DIAGNOSIS — Z79899 Other long term (current) drug therapy: Secondary | ICD-10-CM | POA: Diagnosis not present

## 2020-12-17 DIAGNOSIS — Z789 Other specified health status: Secondary | ICD-10-CM | POA: Diagnosis not present

## 2020-12-17 DIAGNOSIS — Z299 Encounter for prophylactic measures, unspecified: Secondary | ICD-10-CM | POA: Diagnosis not present

## 2020-12-17 DIAGNOSIS — R5383 Other fatigue: Secondary | ICD-10-CM | POA: Diagnosis not present

## 2020-12-17 DIAGNOSIS — Z1331 Encounter for screening for depression: Secondary | ICD-10-CM | POA: Diagnosis not present

## 2020-12-17 DIAGNOSIS — Z6828 Body mass index (BMI) 28.0-28.9, adult: Secondary | ICD-10-CM | POA: Diagnosis not present

## 2020-12-17 DIAGNOSIS — Z125 Encounter for screening for malignant neoplasm of prostate: Secondary | ICD-10-CM | POA: Diagnosis not present

## 2020-12-17 DIAGNOSIS — E7800 Pure hypercholesterolemia, unspecified: Secondary | ICD-10-CM | POA: Diagnosis not present

## 2020-12-17 DIAGNOSIS — Z1339 Encounter for screening examination for other mental health and behavioral disorders: Secondary | ICD-10-CM | POA: Diagnosis not present

## 2020-12-17 DIAGNOSIS — I10 Essential (primary) hypertension: Secondary | ICD-10-CM | POA: Diagnosis not present

## 2020-12-17 DIAGNOSIS — E1165 Type 2 diabetes mellitus with hyperglycemia: Secondary | ICD-10-CM | POA: Diagnosis not present

## 2020-12-22 ENCOUNTER — Encounter: Payer: Self-pay | Admitting: Psychiatry

## 2020-12-22 ENCOUNTER — Telehealth: Payer: Medicare HMO | Admitting: Psychiatry

## 2020-12-22 DIAGNOSIS — F411 Generalized anxiety disorder: Secondary | ICD-10-CM

## 2020-12-22 DIAGNOSIS — F5105 Insomnia due to other mental disorder: Secondary | ICD-10-CM

## 2020-12-22 DIAGNOSIS — F3181 Bipolar II disorder: Secondary | ICD-10-CM

## 2020-12-22 DIAGNOSIS — R69 Illness, unspecified: Secondary | ICD-10-CM | POA: Diagnosis not present

## 2020-12-22 DIAGNOSIS — Z79899 Other long term (current) drug therapy: Secondary | ICD-10-CM | POA: Diagnosis not present

## 2020-12-22 DIAGNOSIS — E0841 Diabetes mellitus due to underlying condition with diabetic mononeuropathy: Secondary | ICD-10-CM | POA: Diagnosis not present

## 2020-12-22 MED ORDER — TRAZODONE HCL 50 MG PO TABS: 50.0000 mg | ORAL_TABLET | Freq: Every evening | ORAL | 3 refills | Status: DC | PRN

## 2020-12-22 MED ORDER — GABAPENTIN 600 MG PO TABS: 600.0000 mg | ORAL_TABLET | Freq: Two times a day (BID) | ORAL | 2 refills | Status: DC

## 2020-12-22 NOTE — Progress Notes (Signed)
Joseph Banks [id number] [birth year] 75 y.o.   Video Visit via My Chart  I connected with pt by My Chart and verified that I am speaking with the correct person using two identifiers.   I discussed the limitations, risks, security and privacy concerns of performing an evaluation and management service by My Chart  and the availability of in person appointments. I also discussed with the patient that there may be a patient responsible charge related to this service. The patient expressed understanding and agreed to proceed.  I discussed the assessment and treatment plan with the patient. The patient was provided an opportunity to ask questions and all were answered. The patient agreed with the plan and demonstrated an understanding of the instructions.   The patient was advised to call back or seek an in-person evaluation if the symptoms worsen or if the condition fails to improve as anticipated.  I provided 30 minutes of video time during this encounter.  The patient was located at home and the provider was located office.  Session from 1030-1100   Subjective:   Patient ID:  Joseph Banks is a 75 y.o. (DOB August 02, 1945) male.  Chief Complaint:  Chief Complaint  Patient presents with   Follow-up   Bipolar II disorder ([place])    Medication Refill Pertinent negatives include no weakness.  Anxiety Symptoms include dizziness. Patient reports no confusion, decreased concentration, nervous/anxious behavior or suicidal ideas.    [name] presents to the office today for follow-up of depression and anxiety.    In 2019 August.   Changed lithium slightly to 600 mg and increased the gabapentin to 600 BID.    visit in March 2020 he was doing well and was more compliant with medication with a good response.  No meds were changed.  seen September2020 without  Med changes.  Main problems is disrupted sleep with some awakening and frequent periods of staying awake for a little while  thereafter.  No depression but don't sleep well with nocturia and neuropathy with toes tingling.  PCP prescribed Lyrica for neuropathy.  Tingling is very uncomfortable at times.  Not taking any gabapentin in pm.  A little dizzy and nausea after morning meds when takes most everything.  09/2019 appt was well emotionally but sleep problems and neuropathy.   Plan:Since his neuropathy is not manage he needs to return to the previous dosage.  Start  600 mg twice daily to help neuropathy at night.   Check lihtium level.  03/24/20 appt with the following noted: Never got lithium level and stopped psych meds lithium and duloxetine AMA. "I had so many pills I was taking" for DM and prostate and decided he wanted to stop things.  Stopped April 1.  Denies depression or mood swings.  CO nocturia. Had called and increased trazodone for awhile. Taking gabapentin and pregabalin. Plan: Recommend he discuss with his primary care doctor gabapentin versus Lyrica.  Typically they are not taken together.  Discussed increased risk of side effects related to this. Continue duloxetine.  12/22/2020 appointment with the following noted: Stomach virus with Covid Neg Continues gabapentin and stopped Lyrica.  Also stopped Cymbalta and lithium.  Taking trazodone. Occ irritable. But overall mood is fine. Mood good.  No depressive episodes.  Patient reports stable mood and denies depressed or irritable moods.  Patient denies any recent difficulty with anxiety.  Patient denies difficulty with sleep initiation but is interrrupted with with  nocturia but normal quantity. Denies appetite disturbance.  Patient reports that energy and motivation have been good.  Patient denies any difficulty with concentration.  Patient denies any suicidal ideation.  Past Psychiatric Medication Trials: Citalopram no response paroxetine 40 with benefit, mirtazapine, duloxetine 90+ lithium with response, aripiprazole 10, lithium 900, trazodone with  benefit, alprazolam, buspirone  Review of Systems:  Review of Systems  Genitourinary:  Positive for frequency.       Nocturia  Neurological:  Positive for dizziness. Negative for tremors and weakness.       Tingling in toes  Psychiatric/Behavioral:  Negative for agitation, behavioral problems, confusion, decreased concentration, dysphoric mood, hallucinations, self-injury, sleep disturbance and suicidal ideas. The patient is not nervous/anxious and is not hyperactive.    Medications: I have reviewed the patient's current medications.  Current Outpatient Medications  Medication Sig Dispense Refill   metFORMIN (GLUCOPHAGE) 500 MG tablet Take 500 mg by mouth 2 (two) times daily.     Potassium 99 MG TABS Take by mouth.     tadalafil (CIALIS) 5 MG tablet Take 1 tablet (5 mg total) by mouth daily. 30 tablet 11   tamsulosin (FLOMAX) 0.4 MG CAPS capsule Take 2 caps daily 180 capsule 3   DULoxetine (CYMBALTA) 30 MG capsule Take 1 capsule (30 mg total) by mouth daily. (Patient not taking: Reported on 12/22/2020) 90 capsule 1   gabapentin (NEURONTIN) 600 MG tablet Take 1 tablet (600 mg total) by mouth 2 (two) times daily. 180 tablet 2   pregabalin (LYRICA) 100 MG capsule Take 100 mg by mouth daily. (Patient not taking: Reported on 12/22/2020)     traZODone (DESYREL) 50 MG tablet Take 1-2 tablets (50-100 mg total) by mouth at bedtime as needed for sleep. 180 tablet 3   No current facility-administered medications for this visit.    Medication Side Effects: None  Allergies: No Known Allergies  History reviewed. No pertinent past medical history.  History reviewed. No pertinent family history.  Social History   Socioeconomic History   Marital status: Married    Spouse name: Not on file   Number of children: Not on file   Years of education: Not on file   Highest education level: Not on file  Occupational History   Not on file  Tobacco Use   Smoking status: Former    Pack years: 0.00    Smokeless tobacco: Never  Substance and Sexual Activity   Alcohol use: Not on file   Drug use: Not on file   Sexual activity: Not on file  Other Topics Concern   Not on file  Social History Narrative   Not on file   Social Determinants of Health   Financial Resource Strain: Not on file  Food Insecurity: Not on file  Transportation Needs: Not on file  Physical Activity: Not on file  Stress: Not on file  Social Connections: Not on file  Intimate Partner Violence: Not on file    Past Medical History, Surgical history, Social history, and Family history were reviewed and updated as appropriate.   Please see review of systems for further details on the patient's review from today.   Objective:   Physical Exam:  There were no vitals taken for this visit.  Physical Exam Neurological:     Mental Status: He is alert and oriented to person, place, and time.     Cranial Nerves: No dysarthria.  Psychiatric:        Attention and Perception: Attention normal. He is attentive. He does not perceive auditory hallucinations.  Mood and Affect: Mood normal. Mood is not anxious or depressed.        Speech: Speech normal. Speech is not rapid and pressured.        Behavior: Behavior is cooperative.        Thought Content: Thought content normal. Thought content is not paranoid or delusional. Thought content does not include homicidal or suicidal ideation. Thought content does not include homicidal or suicidal plan.        Cognition and Memory: Cognition and memory normal.        Judgment: Judgment normal.     Comments: Insight fair.  Historically this is been a problem including now.  Refuses psych meds bc no sx currently.    Lab Review:  No results found for: NA, K, CL, CO2, GLUCOSE, BUN, CREATININE, CALCIUM, PROT, ALBUMIN, AST, ALT, ALKPHOS, BILITOT, GFRNONAA, GFRAA  No results found for: WBC, RBC, HGB, HCT, PLT, MCV, MCH, MCHC, RDW, LYMPHSABS, MONOABS, EOSABS, BASOSABS  No results  found for: POCLITH, LITHIUM   No results found for: PHENYTOIN, PHENOBARB, VALPROATE, CBMZ   Labs from August 21, 2018 included BMP which was normal including calcium 9.4 and creatinine 1.2, TSH 1.85 normal, lithium 0.68 stable  .res Assessment: Plan:    Bipolar II disorder ([place])  Generalized anxiety disorder  Insomnia due to mental condition - Plan: traZODone (DESYREL) 50 MG tablet  Diabetic mononeuropathy associated with diabetes mellitus due to underlying condition ([place]) - Plan: gabapentin (NEURONTIN) 600 MG tablet  Lithium use   Greater than 50% of 30 min of MyChart time wiith patient was spent on counseling and coordination of care. We discussed Importance of consistency emphasized bc history or problems causing relapse.     He has had no significant mood swings or depressive bouts since he was last here.  He satisfied with the medications.  Resume lithium 600 mg nightly rec lithium level as soon as possible. Refuses mood stabilizing psych meds bc no sx currently including lithium. Disc risk relapse without them.  Call early if this occurs.  Continues gabapentin and likes it.  No Lyrica  Trazodone is managing the insomnia except for nocturia and neuropathy in general.  Discussed the use of melatonin supplements which are acceptable.  Discussed risks of sedatives including fall risk at night. Disc option of nocturia meds, he''ll disc with urologist.  History of severe anxiety had been managed with medication.  Both the duloxetine and gabapentin were assumed to be helping but he is had no relapse off the medication so far.  No med changes indicated.  This was a 30-minute appointment  Follow-up 6 months.  Lynder Parents, MD, DFAPA   Please see After Visit Summary for patient specific instructions.  Future Appointments  Date Time Provider [place]  03/24/2021 10:15 AM Franchot Gallo, MD [facility] None    No orders of the defined types were placed in this  encounter.     -------------------------------

## 2020-12-23 ENCOUNTER — Other Ambulatory Visit: Payer: Self-pay

## 2020-12-25 ENCOUNTER — Ambulatory Visit: Payer: Medicare HMO | Admitting: Urology

## 2020-12-25 ENCOUNTER — Encounter: Payer: Self-pay | Admitting: Urology

## 2020-12-25 ENCOUNTER — Other Ambulatory Visit: Payer: Self-pay

## 2020-12-25 ENCOUNTER — Telehealth: Payer: Self-pay

## 2020-12-25 VITALS — BP 145/72 | HR 95 | Temp 100.1°F

## 2020-12-25 DIAGNOSIS — N451 Epididymitis: Secondary | ICD-10-CM | POA: Diagnosis not present

## 2020-12-25 DIAGNOSIS — R300 Dysuria: Secondary | ICD-10-CM

## 2020-12-25 DIAGNOSIS — N410 Acute prostatitis: Secondary | ICD-10-CM | POA: Diagnosis not present

## 2020-12-25 DIAGNOSIS — N400 Enlarged prostate without lower urinary tract symptoms: Secondary | ICD-10-CM

## 2020-12-25 DIAGNOSIS — R339 Retention of urine, unspecified: Secondary | ICD-10-CM

## 2020-12-25 LAB — URINALYSIS, ROUTINE W REFLEX MICROSCOPIC
Bilirubin, UA: NEGATIVE
Glucose, UA: NEGATIVE
Nitrite, UA: POSITIVE — AB
Specific Gravity, UA: 1.025 (ref 1.005–1.030)
Urobilinogen, Ur: 0.2 mg/dL (ref 0.2–1.0)
pH, UA: 5.5 (ref 5.0–7.5)

## 2020-12-25 LAB — MICROSCOPIC EXAMINATION: WBC, UA: >30 /hpf — AB (ref 0–5)

## 2020-12-25 LAB — BLADDER SCAN AMB NON-IMAGING: Scan Result: 238

## 2020-12-25 MED ORDER — SULFAMETHOXAZOLE-TRIMETHOPRIM 800-160 MG PO TABS: 1.0000 | ORAL_TABLET | Freq: Two times a day (BID) | ORAL | 0 refills | Status: DC

## 2020-12-25 MED ORDER — CEFTRIAXONE SODIUM 1 G IJ SOLR
1.0000 g | Freq: Once | INTRAMUSCULAR | Status: AC
  Administered 2020-12-25: 1 g via INTRAMUSCULAR

## 2020-12-25 NOTE — Progress Notes (Signed)
Subjective:  1. Acute prostatitis   2. Right epididymitis   3. Dysuria   4. Benign prostatic hyperplasia, unspecified whether lower urinary tract symptoms present   5. Incomplete bladder emptying      Joseph Banks is a patient of Dr. Alan Ripper with a history of BPH with BOO on tamsulosin 0.8mg  daily.  He had a GI bug over the weekend and began to have dysuria with increased frequency, urgency and a sensation of incomplete emptying.  He has hasn't had hematuria.  His IPSS is 33.  His UA looks infected today.  He has no prior UTI's or prostatitis.  He has had a low grade fever.  He has some mild abdominal discomfort and testicular soreness.  PVR is 261ml today.    IPSS     Row Name 12/25/20 1400         International Prostate Symptom Score   How often have you had the sensation of not emptying your bladder? More than half the time     How often have you had to urinate less than every two hours? Almost always     How often have you found you stopped and started again several times when you urinated? More than half the time     How often have you found it difficult to postpone urination? Almost always     How often have you had a weak urinary stream? Almost always     How often have you had to strain to start urination? Almost always     How many times did you typically get up at night to urinate? 5 Times     Total IPSS Score 33           Quality of Life due to urinary symptoms     If you were to spend the rest of your life with your urinary condition just the way it is now how would you feel about that? Terrible              ROS:  ROS:  A complete review of systems was performed.  All systems are negative except for pertinent findings as noted.   ROS  No Known Allergies  Outpatient Encounter Medications as of 12/25/2020  Medication Sig Note   gabapentin (NEURONTIN) 600 MG tablet Take 1 tablet (600 mg total) by mouth 2 (two) times daily.    metFORMIN (GLUCOPHAGE) 500 MG  tablet Take 500 mg by mouth 2 (two) times daily.    Potassium 99 MG TABS Take by mouth. 03/26/2019: Takes PRN for cramps.    sulfamethoxazole-trimethoprim (BACTRIM DS) 800-160 MG tablet Take 1 tablet by mouth every 12 (twelve) hours.    tadalafil (CIALIS) 5 MG tablet Take 1 tablet (5 mg total) by mouth daily.    tamsulosin (FLOMAX) 0.4 MG CAPS capsule Take 2 caps daily    traZODone (DESYREL) 50 MG tablet Take 1-2 tablets (50-100 mg total) by mouth at bedtime as needed for sleep.    [DISCONTINUED] DULoxetine (CYMBALTA) 30 MG capsule Take 1 capsule (30 mg total) by mouth daily. (Patient not taking: Reported on 12/22/2020)    [DISCONTINUED] pregabalin (LYRICA) 100 MG capsule Take 100 mg by mouth daily. (Patient not taking: Reported on 12/22/2020)    [EXPIRED] cefTRIAXone (ROCEPHIN) injection 1 g     No facility-administered encounter medications on file as of 12/25/2020.    History reviewed. No pertinent past medical history.  History reviewed. No pertinent surgical history.  Social History   Socioeconomic History  Marital status: Married    Spouse name: Not on file   Number of children: Not on file   Years of education: Not on file   Highest education level: Not on file  Occupational History   Not on file  Tobacco Use   Smoking status: Former    Pack years: 0.00   Smokeless tobacco: Never  Substance and Sexual Activity   Alcohol use: Not on file   Drug use: Not on file   Sexual activity: Not on file  Other Topics Concern   Not on file  Social History Narrative   Not on file   Social Determinants of Health   Financial Resource Strain: Not on file  Food Insecurity: Not on file  Transportation Needs: Not on file  Physical Activity: Not on file  Stress: Not on file  Social Connections: Not on file  Intimate Partner Violence: Not on file    History reviewed. No pertinent family history.     Objective: Vitals:   12/25/20 1357  BP: (!) 145/72  Pulse: 95  Temp: 100.1  F (37.8 C)     Physical Exam Vitals reviewed.  Constitutional:      Appearance: Normal appearance.  Genitourinary:    Comments: Normal phallus with adequate meatus. Scrotum has no erythema but the right scrotum is swollen. Right testicle is tender with epididymal enlargement and tenderness. Left testicle and epididymis are normal. Prostate exam was done last week.  Neurological:     Mental Status: He is alert.    Lab Results:  PSA No results found for: PSA No results found for: TESTOSTERONE    Studies/Results: No results found. No results found for this or any previous visit.  No results found for this or any previous visit.  No results found for this or any previous visit.  No results found for this or any previous visit.  No results found for this or any previous visit.  No results found for this or any previous visit.  No results found for this or any previous visit.  No results found for this or any previous visit.    Assessment & Plan: Acute prostatitis with right epididymitis and fever.  A culture was obtained and he was given Rocephin 1gm and a script for bactrim for 2 weeks.   BPH with BOO and incomplete emptying.  His PVR is 27ml but I don't think he needs a foley today.   He will continue his current medications.      Meds ordered this encounter  Medications   cefTRIAXone (ROCEPHIN) injection 1 g    Order Specific Question:   Antibiotic Indication:    Answer:   UTI   sulfamethoxazole-trimethoprim (BACTRIM DS) 800-160 MG tablet    Sig: Take 1 tablet by mouth every 12 (twelve) hours.    Dispense:  28 tablet    Refill:  0      Orders Placed This Encounter  Procedures   Urine Culture   Microscopic Examination   Urinalysis, Routine w reflex microscopic   Bladder Scan (Post Void Residual) in office      Return in about 2 weeks (around 01/08/2021) for with Dr. Diona Fanti. .   CC: Glenda Chroman, MD      Irine Seal 12/25/2020

## 2020-12-25 NOTE — Progress Notes (Signed)
Urological Symptom Review- PVR 238  Patient is experiencing the following symptoms: symptoms started over the weekend after reports of diarrhea per pt.  Frequent urination Hard to postpone urination Burning/pain with urination Get up at night to urinate Stream starts and stops Trouble starting stream Have to strain to urinate Weak stream   Review of Systems  Gastrointestinal (upper)  : Negative for upper GI symptoms  Gastrointestinal (lower) : Diarrhea  Constitutional : Fatigue  Skin: Negative for skin symptoms  Eyes: Negative for eye symptoms  Ear/Nose/Throat : Negative for Ear/Nose/Throat symptoms  Hematologic/Lymphatic: Negative for Hematologic/Lymphatic symptoms  Cardiovascular : Negative for cardiovascular symptoms  Respiratory : Negative for respiratory symptoms  Endocrine: Negative for endocrine symptoms  Musculoskeletal: Back pain  Neurological: Negative for neurological symptoms  Psychologic: Negative for psychiatric symptoms

## 2020-12-25 NOTE — Telephone Encounter (Signed)
Received call from [facility] patient had called about having severe prostatitis symptoms.  I reached out to patient- pt c/o of burning/frequency that started last weekend and symptoms have worsened.  Appt made with patient to see Dr. Jeffie Pollock today.

## 2020-12-30 LAB — URINE CULTURE

## 2021-01-05 DIAGNOSIS — I10 Essential (primary) hypertension: Secondary | ICD-10-CM | POA: Diagnosis not present

## 2021-01-05 DIAGNOSIS — Z299 Encounter for prophylactic measures, unspecified: Secondary | ICD-10-CM | POA: Diagnosis not present

## 2021-01-05 DIAGNOSIS — R69 Illness, unspecified: Secondary | ICD-10-CM | POA: Diagnosis not present

## 2021-01-05 DIAGNOSIS — U071 COVID-19: Secondary | ICD-10-CM | POA: Diagnosis not present

## 2021-01-05 DIAGNOSIS — N419 Inflammatory disease of prostate, unspecified: Secondary | ICD-10-CM | POA: Diagnosis not present

## 2021-01-06 ENCOUNTER — Telehealth: Payer: Medicare HMO | Admitting: Urology

## 2021-01-07 ENCOUNTER — Telehealth: Payer: Self-pay

## 2021-01-07 DIAGNOSIS — R059 Cough, unspecified: Secondary | ICD-10-CM | POA: Diagnosis not present

## 2021-01-07 DIAGNOSIS — R509 Fever, unspecified: Secondary | ICD-10-CM | POA: Diagnosis not present

## 2021-01-07 DIAGNOSIS — R5383 Other fatigue: Secondary | ICD-10-CM | POA: Diagnosis not present

## 2021-01-07 NOTE — Telephone Encounter (Signed)
Patient left Voice Message:  Wanting to know if he needs a refill on antibiotics he is currently taking.  He also has symptoms he wants to discuss with a nurse.  Please advise.  Call back:  [phone]  Thanks, Helene Kelp

## 2021-01-07 NOTE — Telephone Encounter (Signed)
Patient reports he has 1 antibiotic pill let for his recent culture- concerned he may need more antibiotics. Patient denies any difficulty with urination or previous symptoms he had when seen recently. Encouraged patient to continue last dose of medication and keep scheduled appointment this Tuesday with Dr. Diona Fanti. Patient voiced understanding.

## 2021-01-08 DIAGNOSIS — R509 Fever, unspecified: Secondary | ICD-10-CM | POA: Diagnosis not present

## 2021-01-08 DIAGNOSIS — R059 Cough, unspecified: Secondary | ICD-10-CM | POA: Diagnosis not present

## 2021-01-08 DIAGNOSIS — N400 Enlarged prostate without lower urinary tract symptoms: Secondary | ICD-10-CM | POA: Diagnosis not present

## 2021-01-08 DIAGNOSIS — R300 Dysuria: Secondary | ICD-10-CM | POA: Diagnosis not present

## 2021-01-08 DIAGNOSIS — M47816 Spondylosis without myelopathy or radiculopathy, lumbar region: Secondary | ICD-10-CM | POA: Diagnosis not present

## 2021-01-08 DIAGNOSIS — M5136 Other intervertebral disc degeneration, lumbar region: Secondary | ICD-10-CM | POA: Diagnosis not present

## 2021-01-08 DIAGNOSIS — M9973 Connective tissue and disc stenosis of intervertebral foramina of lumbar region: Secondary | ICD-10-CM | POA: Diagnosis not present

## 2021-01-08 DIAGNOSIS — R918 Other nonspecific abnormal finding of lung field: Secondary | ICD-10-CM | POA: Diagnosis not present

## 2021-01-08 DIAGNOSIS — N3289 Other specified disorders of bladder: Secondary | ICD-10-CM | POA: Diagnosis not present

## 2021-01-08 DIAGNOSIS — N3091 Cystitis, unspecified with hematuria: Secondary | ICD-10-CM | POA: Diagnosis not present

## 2021-01-08 DIAGNOSIS — N410 Acute prostatitis: Secondary | ICD-10-CM | POA: Diagnosis not present

## 2021-01-08 DIAGNOSIS — N3090 Cystitis, unspecified without hematuria: Secondary | ICD-10-CM | POA: Diagnosis not present

## 2021-01-08 DIAGNOSIS — R319 Hematuria, unspecified: Secondary | ICD-10-CM | POA: Diagnosis not present

## 2021-01-12 ENCOUNTER — Encounter (HOSPITAL_COMMUNITY): Payer: Self-pay | Admitting: *Deleted

## 2021-01-12 ENCOUNTER — Other Ambulatory Visit: Payer: Self-pay

## 2021-01-12 ENCOUNTER — Emergency Department (HOSPITAL_COMMUNITY): Payer: Medicare HMO

## 2021-01-12 ENCOUNTER — Inpatient Hospital Stay (HOSPITAL_COMMUNITY)
Admission: EM | Admit: 2021-01-12 | Discharge: 2021-01-30 | DRG: 853 | Disposition: A | Discharge status: Skilled Nursing Facility | Payer: Medicare HMO | Attending: Internal Medicine | Admitting: Internal Medicine

## 2021-01-12 DIAGNOSIS — G928 Other toxic encephalopathy: Secondary | ICD-10-CM | POA: Diagnosis present

## 2021-01-12 DIAGNOSIS — I672 Cerebral atherosclerosis: Secondary | ICD-10-CM | POA: Diagnosis not present

## 2021-01-12 DIAGNOSIS — E119 Type 2 diabetes mellitus without complications: Secondary | ICD-10-CM | POA: Diagnosis not present

## 2021-01-12 DIAGNOSIS — R1310 Dysphagia, unspecified: Secondary | ICD-10-CM | POA: Diagnosis present

## 2021-01-12 DIAGNOSIS — I4891 Unspecified atrial fibrillation: Secondary | ICD-10-CM | POA: Diagnosis present

## 2021-01-12 DIAGNOSIS — Z0189 Encounter for other specified special examinations: Secondary | ICD-10-CM

## 2021-01-12 DIAGNOSIS — R6520 Severe sepsis without septic shock: Secondary | ICD-10-CM | POA: Diagnosis present

## 2021-01-12 DIAGNOSIS — A86 Unspecified viral encephalitis: Secondary | ICD-10-CM

## 2021-01-12 DIAGNOSIS — Z9889 Other specified postprocedural states: Secondary | ICD-10-CM | POA: Diagnosis not present

## 2021-01-12 DIAGNOSIS — N136 Pyonephrosis: Secondary | ICD-10-CM | POA: Diagnosis present

## 2021-01-12 DIAGNOSIS — Z9079 Acquired absence of other genital organ(s): Secondary | ICD-10-CM

## 2021-01-12 DIAGNOSIS — Z4682 Encounter for fitting and adjustment of non-vascular catheter: Secondary | ICD-10-CM | POA: Diagnosis not present

## 2021-01-12 DIAGNOSIS — Z7984 Long term (current) use of oral hypoglycemic drugs: Secondary | ICD-10-CM

## 2021-01-12 DIAGNOSIS — R5381 Other malaise: Secondary | ICD-10-CM | POA: Diagnosis not present

## 2021-01-12 DIAGNOSIS — A858 Other specified viral encephalitis: Secondary | ICD-10-CM | POA: Diagnosis not present

## 2021-01-12 DIAGNOSIS — N390 Urinary tract infection, site not specified: Secondary | ICD-10-CM | POA: Diagnosis present

## 2021-01-12 DIAGNOSIS — K7200 Acute and subacute hepatic failure without coma: Secondary | ICD-10-CM | POA: Diagnosis present

## 2021-01-12 DIAGNOSIS — E872 Acidosis: Secondary | ICD-10-CM | POA: Diagnosis present

## 2021-01-12 DIAGNOSIS — N3001 Acute cystitis with hematuria: Secondary | ICD-10-CM | POA: Diagnosis not present

## 2021-01-12 DIAGNOSIS — Z87440 Personal history of urinary (tract) infections: Secondary | ICD-10-CM | POA: Diagnosis not present

## 2021-01-12 DIAGNOSIS — A878 Other viral meningitis: Secondary | ICD-10-CM | POA: Diagnosis present

## 2021-01-12 DIAGNOSIS — I6529 Occlusion and stenosis of unspecified carotid artery: Secondary | ICD-10-CM | POA: Diagnosis not present

## 2021-01-12 DIAGNOSIS — A419 Sepsis, unspecified organism: Secondary | ICD-10-CM | POA: Diagnosis not present

## 2021-01-12 DIAGNOSIS — Z8616 Personal history of COVID-19: Secondary | ICD-10-CM | POA: Diagnosis not present

## 2021-01-12 DIAGNOSIS — N320 Bladder-neck obstruction: Secondary | ICD-10-CM

## 2021-01-12 DIAGNOSIS — Z79899 Other long term (current) drug therapy: Secondary | ICD-10-CM | POA: Diagnosis not present

## 2021-01-12 DIAGNOSIS — F419 Anxiety disorder, unspecified: Secondary | ICD-10-CM | POA: Diagnosis present

## 2021-01-12 DIAGNOSIS — S3722XA Contusion of bladder, initial encounter: Secondary | ICD-10-CM | POA: Diagnosis not present

## 2021-01-12 DIAGNOSIS — G9340 Encephalopathy, unspecified: Secondary | ICD-10-CM | POA: Diagnosis not present

## 2021-01-12 DIAGNOSIS — R509 Fever, unspecified: Secondary | ICD-10-CM | POA: Diagnosis not present

## 2021-01-12 DIAGNOSIS — N401 Enlarged prostate with lower urinary tract symptoms: Secondary | ICD-10-CM | POA: Diagnosis present

## 2021-01-12 DIAGNOSIS — I517 Cardiomegaly: Secondary | ICD-10-CM | POA: Diagnosis not present

## 2021-01-12 DIAGNOSIS — N179 Acute kidney failure, unspecified: Secondary | ICD-10-CM | POA: Diagnosis present

## 2021-01-12 DIAGNOSIS — Z9189 Other specified personal risk factors, not elsewhere classified: Secondary | ICD-10-CM

## 2021-01-12 DIAGNOSIS — R109 Unspecified abdominal pain: Secondary | ICD-10-CM | POA: Diagnosis not present

## 2021-01-12 DIAGNOSIS — Z95828 Presence of other vascular implants and grafts: Secondary | ICD-10-CM

## 2021-01-12 DIAGNOSIS — G4700 Insomnia, unspecified: Secondary | ICD-10-CM | POA: Diagnosis present

## 2021-01-12 DIAGNOSIS — R918 Other nonspecific abnormal finding of lung field: Secondary | ICD-10-CM | POA: Diagnosis not present

## 2021-01-12 DIAGNOSIS — Z87891 Personal history of nicotine dependence: Secondary | ICD-10-CM

## 2021-01-12 DIAGNOSIS — R9431 Abnormal electrocardiogram [ECG] [EKG]: Secondary | ICD-10-CM | POA: Diagnosis not present

## 2021-01-12 DIAGNOSIS — R6521 Severe sepsis with septic shock: Secondary | ICD-10-CM | POA: Diagnosis present

## 2021-01-12 DIAGNOSIS — D696 Thrombocytopenia, unspecified: Secondary | ICD-10-CM

## 2021-01-12 DIAGNOSIS — N138 Other obstructive and reflux uropathy: Secondary | ICD-10-CM | POA: Diagnosis present

## 2021-01-12 DIAGNOSIS — S301XXA Contusion of abdominal wall, initial encounter: Secondary | ICD-10-CM | POA: Diagnosis not present

## 2021-01-12 DIAGNOSIS — Z452 Encounter for adjustment and management of vascular access device: Secondary | ICD-10-CM | POA: Diagnosis not present

## 2021-01-12 DIAGNOSIS — R600 Localized edema: Secondary | ICD-10-CM | POA: Diagnosis not present

## 2021-01-12 DIAGNOSIS — M549 Dorsalgia, unspecified: Secondary | ICD-10-CM

## 2021-01-12 DIAGNOSIS — N400 Enlarged prostate without lower urinary tract symptoms: Secondary | ICD-10-CM | POA: Diagnosis not present

## 2021-01-12 DIAGNOSIS — N410 Acute prostatitis: Secondary | ICD-10-CM | POA: Diagnosis present

## 2021-01-12 DIAGNOSIS — R339 Retention of urine, unspecified: Secondary | ICD-10-CM | POA: Diagnosis not present

## 2021-01-12 DIAGNOSIS — J984 Other disorders of lung: Secondary | ICD-10-CM | POA: Diagnosis not present

## 2021-01-12 DIAGNOSIS — R7989 Other specified abnormal findings of blood chemistry: Secondary | ICD-10-CM | POA: Diagnosis present

## 2021-01-12 DIAGNOSIS — N419 Inflammatory disease of prostate, unspecified: Secondary | ICD-10-CM | POA: Diagnosis not present

## 2021-01-12 DIAGNOSIS — J9811 Atelectasis: Secondary | ICD-10-CM | POA: Diagnosis not present

## 2021-01-12 DIAGNOSIS — D6959 Other secondary thrombocytopenia: Secondary | ICD-10-CM | POA: Diagnosis not present

## 2021-01-12 DIAGNOSIS — F411 Generalized anxiety disorder: Secondary | ICD-10-CM | POA: Diagnosis present

## 2021-01-12 DIAGNOSIS — T17908A Unspecified foreign body in respiratory tract, part unspecified causing other injury, initial encounter: Secondary | ICD-10-CM

## 2021-01-12 DIAGNOSIS — E871 Hypo-osmolality and hyponatremia: Secondary | ICD-10-CM | POA: Diagnosis not present

## 2021-01-12 DIAGNOSIS — J90 Pleural effusion, not elsewhere classified: Secondary | ICD-10-CM | POA: Diagnosis not present

## 2021-01-12 DIAGNOSIS — R4182 Altered mental status, unspecified: Secondary | ICD-10-CM | POA: Diagnosis not present

## 2021-01-12 HISTORY — DX: Type 2 diabetes mellitus without complications: E11.9

## 2021-01-12 HISTORY — DX: Benign prostatic hyperplasia without lower urinary tract symptoms: N40.0

## 2021-01-12 LAB — URINALYSIS, ROUTINE W REFLEX MICROSCOPIC
Bilirubin Urine: NEGATIVE
Glucose, UA: 50 mg/dL — AB
Ketones, ur: 5 mg/dL — AB
Leukocytes,Ua: NEGATIVE
Nitrite: POSITIVE — AB
Protein, ur: 100 mg/dL — AB
RBC / HPF: >50 RBC/hpf — ABNORMAL HIGH (ref 0–5)
Specific Gravity, Urine: 1.025 (ref 1.005–1.030)
pH: 6 (ref 5.0–8.0)

## 2021-01-12 LAB — COMPREHENSIVE METABOLIC PANEL
ALT: 107 U/L — ABNORMAL HIGH (ref 0–44)
ALT: 86 U/L — ABNORMAL HIGH (ref 0–44)
AST: 181 U/L — ABNORMAL HIGH (ref 15–41)
AST: 150 U/L — ABNORMAL HIGH (ref 15–41)
Albumin: 2.6 g/dL — ABNORMAL LOW (ref 3.5–5.0)
Albumin: 2.1 g/dL — ABNORMAL LOW (ref 3.5–5.0)
Alkaline Phosphatase: 90 U/L (ref 38–126)
Alkaline Phosphatase: 69 U/L (ref 38–126)
Anion gap: 12 (ref 5–15)
Anion gap: 10 (ref 5–15)
BUN: 24 mg/dL — ABNORMAL HIGH (ref 8–23)
BUN: 22 mg/dL (ref 8–23)
CO2: 20 mmol/L — ABNORMAL LOW (ref 22–32)
CO2: 21 mmol/L — ABNORMAL LOW (ref 22–32)
Calcium: 8.2 mg/dL — ABNORMAL LOW (ref 8.9–10.3)
Calcium: 7.7 mg/dL — ABNORMAL LOW (ref 8.9–10.3)
Chloride: 100 mmol/L (ref 98–111)
Chloride: 100 mmol/L (ref 98–111)
Creatinine, Ser: 1.26 mg/dL — ABNORMAL HIGH (ref 0.61–1.24)
Creatinine, Ser: 1.11 mg/dL (ref 0.61–1.24)
GFR, Estimated: 60 mL/min — ABNORMAL LOW (ref >60)
GFR, Estimated: >60 mL/min (ref >60)
Glucose, Bld: 142 mg/dL — ABNORMAL HIGH (ref 70–99)
Glucose, Bld: 141 mg/dL — ABNORMAL HIGH (ref 70–99)
Potassium: 4.3 mmol/L (ref 3.5–5.1)
Potassium: 4.2 mmol/L (ref 3.5–5.1)
Sodium: 132 mmol/L — ABNORMAL LOW (ref 135–145)
Sodium: 131 mmol/L — ABNORMAL LOW (ref 135–145)
Total Bilirubin: 0.9 mg/dL (ref 0.3–1.2)
Total Bilirubin: 0.8 mg/dL (ref 0.3–1.2)
Total Protein: 6.1 g/dL — ABNORMAL LOW (ref 6.5–8.1)
Total Protein: 5.2 g/dL — ABNORMAL LOW (ref 6.5–8.1)

## 2021-01-12 LAB — CBC WITH DIFFERENTIAL/PLATELET
Abs Immature Granulocytes: 0.03 10*3/uL (ref 0.00–0.07)
Basophils Absolute: 0 10*3/uL (ref 0.0–0.1)
Basophils Relative: 1 %
Eosinophils Absolute: 0 10*3/uL (ref 0.0–0.5)
Eosinophils Relative: 0 %
HCT: 33.8 % — ABNORMAL LOW (ref 39.0–52.0)
Hemoglobin: 11.8 g/dL — ABNORMAL LOW (ref 13.0–17.0)
Immature Granulocytes: 1 %
Lymphocytes Relative: 16 %
Lymphs Abs: 0.9 10*3/uL (ref 0.7–4.0)
MCH: 31.9 pg (ref 26.0–34.0)
MCHC: 34.9 g/dL (ref 30.0–36.0)
MCV: 91.4 fL (ref 80.0–100.0)
Monocytes Absolute: 0.2 10*3/uL (ref 0.1–1.0)
Monocytes Relative: 3 %
Neutro Abs: 4.8 10*3/uL (ref 1.7–7.7)
Neutrophils Relative %: 79 %
Platelets: 49 10*3/uL — ABNORMAL LOW (ref 150–400)
RBC: 3.7 MIL/uL — ABNORMAL LOW (ref 4.22–5.81)
RDW: 14.4 % (ref 11.5–15.5)
WBC: 6 10*3/uL (ref 4.0–10.5)
nRBC: 0 % (ref 0.0–0.2)

## 2021-01-12 LAB — GLUCOSE, CAPILLARY
Glucose-Capillary: 129 mg/dL — ABNORMAL HIGH (ref 70–99)
Glucose-Capillary: 132 mg/dL — ABNORMAL HIGH (ref 70–99)
Glucose-Capillary: 155 mg/dL — ABNORMAL HIGH (ref 70–99)

## 2021-01-12 LAB — LACTIC ACID, PLASMA
Lactic Acid, Venous: 2 mmol/L (ref 0.5–1.9)
Lactic Acid, Venous: 3.2 mmol/L (ref 0.5–1.9)
Lactic Acid, Venous: 3 mmol/L (ref 0.5–1.9)
Lactic Acid, Venous: 2.3 mmol/L (ref 0.5–1.9)

## 2021-01-12 LAB — APTT
aPTT: 32 seconds (ref 24–36)
aPTT: 31 seconds (ref 24–36)

## 2021-01-12 LAB — PROTIME-INR
INR: 1 (ref 0.8–1.2)
INR: 1 (ref 0.8–1.2)
Prothrombin Time: 13.6 seconds (ref 11.4–15.2)
Prothrombin Time: 13.5 seconds (ref 11.4–15.2)

## 2021-01-12 LAB — RESP PANEL BY RT-PCR (FLU A&B, COVID) ARPGX2
Influenza A by PCR: NEGATIVE
Influenza B by PCR: NEGATIVE
SARS Coronavirus 2 by RT PCR: NEGATIVE

## 2021-01-12 LAB — MRSA NEXT GEN BY PCR, NASAL: MRSA by PCR Next Gen: NOT DETECTED

## 2021-01-12 LAB — MAGNESIUM: Magnesium: 1.7 mg/dL (ref 1.7–2.4)

## 2021-01-12 LAB — CBG MONITORING, ED: Glucose-Capillary: 133 mg/dL — ABNORMAL HIGH (ref 70–99)

## 2021-01-12 MED ORDER — LACTATED RINGERS IV BOLUS (SEPSIS)
2000.0000 mL | Freq: Once | INTRAVENOUS | Status: AC
  Administered 2021-01-12: 2000 mL via INTRAVENOUS

## 2021-01-12 MED ORDER — DILTIAZEM HCL 25 MG/5ML IV SOLN
10.0000 mg | Freq: Once | INTRAVENOUS | Status: AC
  Administered 2021-01-12: 10 mg via INTRAVENOUS
  Filled 2021-01-12: qty 5

## 2021-01-12 MED ORDER — VANCOMYCIN HCL 1500 MG/300ML IV SOLN: 1500.0000 mg | Freq: Once | INTRAVENOUS | Status: DC

## 2021-01-12 MED ORDER — IOHEXOL 300 MG/ML  SOLN
100.0000 mL | Freq: Once | INTRAMUSCULAR | Status: AC | PRN
  Administered 2021-01-12: 100 mL via INTRAVENOUS

## 2021-01-12 MED ORDER — ACETAMINOPHEN 650 MG RE SUPP
650.0000 mg | RECTAL | Status: AC | PRN
  Administered 2021-01-13 (×2): 650 mg via RECTAL
  Filled 2021-01-12 (×2): qty 1

## 2021-01-12 MED ORDER — FENTANYL CITRATE (PF) 100 MCG/2ML IJ SOLN
25.0000 ug | Freq: Once | INTRAMUSCULAR | Status: AC
Start: 2021-01-12 — End: 2021-01-12
  Administered 2021-01-12: 25 ug via INTRAVENOUS

## 2021-01-12 MED ORDER — FINASTERIDE 5 MG PO TABS
5.0000 mg | ORAL_TABLET | Freq: Every day | ORAL | Status: DC
  Administered 2021-01-15 – 2021-01-30 (×16): 5 mg via ORAL
  Filled 2021-01-12 (×17): qty 1

## 2021-01-12 MED ORDER — FENTANYL CITRATE (PF) 100 MCG/2ML IJ SOLN
INTRAMUSCULAR | Status: AC
  Filled 2021-01-12: qty 2

## 2021-01-12 MED ORDER — SODIUM CHLORIDE 0.9 % IV SOLN
1.0000 g | INTRAVENOUS | Status: AC
  Administered 2021-01-12: 1 g via INTRAVENOUS
  Filled 2021-01-12: qty 1

## 2021-01-12 MED ORDER — LACTATED RINGERS IV SOLN: INTRAVENOUS | Status: DC

## 2021-01-12 MED ORDER — ACETAMINOPHEN 325 MG PO TABS
650.0000 mg | ORAL_TABLET | Freq: Once | ORAL | Status: DC
  Filled 2021-01-12: qty 2

## 2021-01-12 MED ORDER — SODIUM CHLORIDE 0.9 % IV SOLN
1.0000 g | Freq: Three times a day (TID) | INTRAVENOUS | Status: DC
  Administered 2021-01-13 – 2021-01-15 (×8): 1 g via INTRAVENOUS
  Filled 2021-01-12 (×9): qty 1

## 2021-01-12 MED ORDER — LACTATED RINGERS IV BOLUS (SEPSIS)
1000.0000 mL | Freq: Once | INTRAVENOUS | Status: AC
Start: 2021-01-12 — End: 2021-01-12
  Administered 2021-01-12: 1000 mL via INTRAVENOUS

## 2021-01-12 MED ORDER — FENTANYL CITRATE (PF) 100 MCG/2ML IJ SOLN
25.0000 ug | Freq: Once | INTRAMUSCULAR | Status: AC
  Administered 2021-01-12: 25 ug via INTRAVENOUS

## 2021-01-12 MED ORDER — SODIUM CHLORIDE 0.9 % IV SOLN
1.0000 g | Freq: Once | INTRAVENOUS | Status: AC
  Administered 2021-01-12: 1 g via INTRAVENOUS
  Filled 2021-01-12: qty 10

## 2021-01-12 MED ORDER — LACTATED RINGERS IV BOLUS
1000.0000 mL | Freq: Once | INTRAVENOUS | Status: AC
  Administered 2021-01-12: 1000 mL via INTRAVENOUS

## 2021-01-12 MED ORDER — INSULIN ASPART 100 UNIT/ML IJ SOLN: 0.0000 [IU] | INTRAMUSCULAR | Status: DC

## 2021-01-12 MED ORDER — VANCOMYCIN HCL 2000 MG/400ML IV SOLN
2000.0000 mg | INTRAVENOUS | Status: DC
  Administered 2021-01-12 – 2021-01-14 (×3): 2000 mg via INTRAVENOUS
  Filled 2021-01-12 (×4): qty 400

## 2021-01-12 MED ORDER — ACETAMINOPHEN 650 MG RE SUPP
650.0000 mg | Freq: Once | RECTAL | Status: AC
  Administered 2021-01-12: 650 mg via RECTAL
  Filled 2021-01-12: qty 1

## 2021-01-12 MED ORDER — CHLORHEXIDINE GLUCONATE CLOTH 2 % EX PADS
6.0000 | MEDICATED_PAD | Freq: Every day | CUTANEOUS | Status: DC
  Administered 2021-01-13 – 2021-01-26 (×15): 6 via TOPICAL

## 2021-01-12 NOTE — Progress Notes (Signed)
eLink Physician-Brief Progress Note Patient Name: Joseph Banks DOB: [birth year] MRN: [id number]   Date of Service  01/12/2021  HPI/Events of Note  12 M DM, BPH s/p TURP, recently treated for prostatitis after presenting to the ED with dysuria and hesitancy. He returns with altered sensorium and lactic acidosis now transferred to the ICU for fever and worsening sensorium. Also in afib RVR. Abd CT with possible clot in the bladder.  eICU Interventions  UTI sepsis given ceftriaxone now on meropenem and vancomycin. Urology following Afib RVR, give tylenol supp for fever. Already received 4 L crystalloids     Intervention Category Evaluation Type: New Patient Evaluation  Judd Lien 01/12/2021, 11:14 PM

## 2021-01-12 NOTE — H&P (Signed)
History and Physical    Joseph Banks UVO:[id number] DOB: Jun 10, 1946 DOA: 01/12/2021  PCP: Glenda Chroman, MD  Patient coming from: Home.  Chief Complaint: Fever and pain in the perineal area.  HPI: Joseph Banks is a 75 y.o. male with history of diabetes mellitus who has been experiencing perianal pain and had been to Dr. Jeffie Pollock urologist on December 25, 2020 at that time was diagnosed with prostatitis and epididymitis was given 1 shot of ceftriaxone and was placed on Bactrim.  On January 08, 2021 patient went to the ER at [facility] with complaints of worsening pain and fever 104.  At the time patient had a CT scan and also was placed patient on ciprofloxacin which patient has been taking for the last 4 days despite which patient's pain has been worsening with the worsening fever according temperatures up to 104 degree.  Patient also has been increasing difficulty urinating with urine becoming more dark.  ED Course: In the ER patient is tachycardic and febrile with CT scan done today showing persistent mass in the bladder and enlarged prostate which I discussed with on-call urologist Dr. Junious Silk who wanted patient to be transferred to [facility] long hospital.  Blood cultures obtained and patient was placed on empiric antibiotics.  Labs show worsening of his platelet counts which was 128 on January 08, 2021 and it is around 67 today and patient's lactic acid 2 at admission is now 3.2 is getting further bolus of fluid and patient also has elevated LFTs when compared to one done 4 days ago.  Patient went to A. fib with RVR rate improved with Cardizem bolus 10 mg.  No previous history of A. fib.  COVID test is negative.  Review of Systems: As per HPI, rest all negative.   Past Medical History:  Diagnosis Date   Diabetes mellitus without complication ([place])     History reviewed. No pertinent surgical history.   reports that he has quit smoking. He has never used smokeless tobacco. No history on file for  alcohol use and drug use.  No Known Allergies  History reviewed. No pertinent family history.  Prior to Admission medications   Medication Sig Start Date End Date Taking? Authorizing Provider  gabapentin (NEURONTIN) 600 MG tablet Take 1 tablet (600 mg total) by mouth 2 (two) times daily. 12/22/20  Yes Cottle, Billey Co., MD  metFORMIN (GLUCOPHAGE) 500 MG tablet Take 500 mg by mouth 2 (two) times daily. 02/07/19   [provider]  Potassium 99 MG TABS Take by mouth. Patient not taking: Reported on 01/12/2021    [provider]  sulfamethoxazole-trimethoprim (BACTRIM DS) 800-160 MG tablet Take 1 tablet by mouth every 12 (twelve) hours. 12/25/20   Irine Seal, MD  tadalafil (CIALIS) 5 MG tablet Take 1 tablet (5 mg total) by mouth daily. 12/16/20   Franchot Gallo, MD  tamsulosin [facility]) 0.4 MG CAPS capsule Take 2 caps daily 12/16/20   Franchot Gallo, MD  traZODone (DESYREL) 50 MG tablet Take 1-2 tablets (50-100 mg total) by mouth at bedtime as needed for sleep. 12/22/20   Cottle, Billey Co., MD    Physical Exam: Constitutional: Moderately built and nourished. Vitals:   01/12/21 1630 01/12/21 1700 01/12/21 1713 01/12/21 1730  BP: (!) 163/97 139/76  (!) 154/133  Pulse: (!) 107 (!) 148  (!) 119  Resp: (!) 21 (!) 28  (!) 33  Temp:      TempSrc:      SpO2:  97%     Weight:  101.2 kg 101.2 kg   Height:       Eyes: Anicteric no pallor. ENMT: No discharge from the ears eyes nose and mouth. Neck: No mass felt.  No neck rigidity. Respiratory: No rhonchi or crepitations. Cardiovascular: S1-S2 heard. Abdomen: Soft tenderness in the perianal area.  No active discharge from the penis. Musculoskeletal: No edema. Skin: No rash. Neurologic: Alert awake oriented to name and place.  Moving all extremities. Psychiatric: Oriented to name and place.   Labs on Admission: I have personally reviewed following labs and imaging studies  CBC: Recent Labs  Lab 01/12/21 1257  WBC 6.0   NEUTROABS 4.8  HGB 11.8*  HCT 33.8*  MCV 91.4  PLT 49*   Basic Metabolic Panel: Recent Labs  Lab 01/12/21 1257  NA 132*  K 4.3  CL 100  CO2 20*  GLUCOSE 142*  BUN 24*  CREATININE 1.26*  CALCIUM 8.2*   GFR: Estimated Creatinine Clearance: 61.5 mL/min (A) (by C-G formula based on SCr of 1.26 mg/dL (H)). Liver Function Tests: Recent Labs  Lab 01/12/21 1257  AST 181*  ALT 107*  ALKPHOS 90  BILITOT 0.9  PROT 6.1*  ALBUMIN 2.6*   No results for input(s): LIPASE, AMYLASE in the last 168 hours. No results for input(s): AMMONIA in the last 168 hours. Coagulation Profile: Recent Labs  Lab 01/12/21 1257  INR 1.0   Cardiac Enzymes: No results for input(s): CKTOTAL, CKMB, CKMBINDEX, TROPONINI in the last 168 hours. BNP (last 3 results) No results for input(s): PROBNP in the last 8760 hours. HbA1C: No results for input(s): HGBA1C in the last 72 hours. CBG: No results for input(s): GLUCAP in the last 168 hours. Lipid Profile: No results for input(s): CHOL, HDL, LDLCALC, TRIG, CHOLHDL, LDLDIRECT in the last 72 hours. Thyroid Function Tests: No results for input(s): TSH, T4TOTAL, FREET4, T3FREE, THYROIDAB in the last 72 hours. Anemia Panel: No results for input(s): VITAMINB12, FOLATE, FERRITIN, TIBC, IRON, RETICCTPCT in the last 72 hours. Urine analysis:    Component Value Date/Time   COLORURINE AMBER (A) 01/12/2021 1308   APPEARANCEUR HAZY (A) 01/12/2021 1308   APPEARANCEUR Cloudy (A) 12/25/2020 1357   LABSPEC 1.025 01/12/2021 1308   PHURINE 6.0 01/12/2021 1308   GLUCOSEU 50 (A) 01/12/2021 1308   HGBUR LARGE (A) 01/12/2021 1308   BILIRUBINUR NEGATIVE 01/12/2021 1308   BILIRUBINUR Negative 12/25/2020 1357   KETONESUR 5 (A) 01/12/2021 1308   PROTEINUR 100 (A) 01/12/2021 1308   UROBILINOGEN negative (A) 08/14/2019 1030   NITRITE POSITIVE (A) 01/12/2021 1308   LEUKOCYTESUR NEGATIVE 01/12/2021 1308   Sepsis  Labs: @LABRCNTIP (procalcitonin:4,lacticidven:4) ) Recent Results (from the past 240 hour(s))  Blood culture (routine single)     Status: None (Preliminary result)   Collection Time: 01/12/21 12:57 PM   Specimen: Right Antecubital; Blood  Result Value Ref Range Status   Specimen Description   Final    RIGHT ANTECUBITAL BOTTLES DRAWN AEROBIC AND ANAEROBIC   Special Requests   Final    Blood Culture adequate volume Performed at [facility], [address] [place],  [address]    Culture PENDING  Incomplete   Report Status PENDING  Incomplete  Resp Panel by RT-PCR (Flu A&B, Covid) Nasopharyngeal Swab     Status: None   Collection Time: 01/12/21  1:09 PM   Specimen: Nasopharyngeal Swab; Nasopharyngeal(NP) swabs in vial transport medium  Result Value Ref Range Status   SARS Coronavirus 2 by RT PCR  NEGATIVE NEGATIVE Final    Comment: (NOTE) SARS-CoV-2 target nucleic acids are NOT DETECTED.  The SARS-CoV-2 RNA is generally detectable in upper respiratory specimens during the acute phase of infection. The lowest concentration of SARS-CoV-2 viral copies this assay can detect is 138 copies/mL. A negative result does not preclude SARS-Cov-2 infection and should not be used as the sole basis for treatment or other patient management decisions. A negative result may occur with  improper specimen collection/handling, submission of specimen other than nasopharyngeal swab, presence of viral mutation(s) within the areas targeted by this assay, and inadequate number of viral copies(<138 copies/mL). A negative result must be combined with clinical observations, patient history, and epidemiological information. The expected result is Negative.  Fact Sheet for Patients:  EntrepreneurPulse.com.au  Fact Sheet for Healthcare Providers:  IncredibleEmployment.be  This test is no t yet approved or cleared by the [place] FDA and  has been authorized for  detection and/or diagnosis of SARS-CoV-2 by FDA under an Emergency Use Authorization (EUA). This EUA will remain  in effect (meaning this test can be used) for the duration of the COVID-19 declaration under Section 564(b)(1) of the Act, 21 U.S.C.section 360bbb-3(b)(1), unless the authorization is terminated  or revoked sooner.       Influenza A by PCR NEGATIVE NEGATIVE Final   Influenza B by PCR NEGATIVE NEGATIVE Final    Comment: (NOTE) The Xpert Xpress SARS-CoV-2/FLU/RSV plus assay is intended as an aid in the diagnosis of influenza from Nasopharyngeal swab specimens and should not be used as a sole basis for treatment. Nasal washings and aspirates are unacceptable for Xpert Xpress SARS-CoV-2/FLU/RSV testing.  Fact Sheet for Patients: EntrepreneurPulse.com.au  Fact Sheet for Healthcare Providers: IncredibleEmployment.be  This test is not yet approved or cleared by the [place] FDA and has been authorized for detection and/or diagnosis of SARS-CoV-2 by FDA under an Emergency Use Authorization (EUA). This EUA will remain in effect (meaning this test can be used) for the duration of the COVID-19 declaration under Section 564(b)(1) of the Act, 21 U.S.C. section 360bbb-3(b)(1), unless the authorization is terminated or revoked.  Performed at [facility], [address], [place], [place] [address]      Radiological Exams on Admission: CT ABDOMEN PELVIS W CONTRAST  Result Date: 01/12/2021 CLINICAL DATA:  Abdominal pain. EXAM: CT ABDOMEN AND PELVIS WITH CONTRAST TECHNIQUE: Multidetector CT imaging of the abdomen and pelvis was performed using the standard protocol following bolus administration of intravenous contrast. CONTRAST:  138mL OMNIPAQUE IOHEXOL 300 MG/ML  SOLN COMPARISON:  01/08/2021 FINDINGS: Lower chest: Subsegmental atelectasis and dependent change noted in the right lung base. Hepatobiliary: No focal liver abnormality. The  gallbladder is normal. No signs of bile duct dilatation Pancreas: Unremarkable. No pancreatic ductal dilatation or surrounding inflammatory changes. Spleen: Normal in size without focal abnormality. Adrenals/Urinary Tract: Normal adrenal glands. No kidney mass identified bilaterally. Bilateral pelviectasis is new from previous exam there is mild bilateral hydroureter. Mild bladder distension. Streak artifact from right hip arthroplasty device diminishes evaluation of the bladder. Similar to the previous exam is a heterogeneous, hyperdense mass within the bladder measuring 6.8 x 5.8 by 4.8 cm. Stomach/Bowel: Small hiatal hernia. Stomach is nondistended the appendix is not confidently identified no secondary signs of acute appendicitis no bowel wall thickening, inflammation, or distension. Vascular/Lymphatic: Aortic atherosclerosis. No retroperitoneal or mesenteric adenopathy multiple prominent, non pathologically enlarged pelvic lymph nodes are identified similar to previous exam for example, right external iliac node measures 0.9 cm, image 68/2.  Borderline enlarged bilateral inguinal lymph nodes are identified. Index left inguinal lymph node measures 1.3 cm, image 98/2. Index right inguinal node measures 1.2 cm, image 98/2. Reproductive: Prostate gland appears enlarged with mass effect upon the bladder base measuring approximate 6.9 x 5.6 x 7.4 cm (volume = 150 cm^3). Other: No free fluid or fluid collections. 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 Similar appearance of heterogeneous, hyperdense mass within the bladder which may represent a large blood clot or neoplasm. Evaluation the bladder is diminished secondary to beam hardening artifact from patient's right hip arthroplasty device. Bladder ultrasound may be helpful in the acute setting to assess the nature of this mass.  2. Bilateral pelviectasis and hydroureter. 3. Marked prostate gland enlargement with mass effect upon the bladder base. 4. 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 5. Aortic atherosclerosis. Aortic Atherosclerosis (ICD10-I70.0). Electronically Signed   By: Kerby Moors M.D.   On: 01/12/2021 15:55   DG Chest Port 1 View  Result Date: 01/12/2021 CLINICAL DATA:  Questionable sepsis. EXAM: PORTABLE CHEST 1 VIEW COMPARISON:  Abdominal CT 12/12/2020 FINDINGS: Portable views of the chest were obtained. Few densities at the left lung base and there were small densities in this area on the previous CT topogram. Otherwise, the lungs are clear. Negative for a pneumothorax. Heart and mediastinum are within normal limits. Trachea is midline. IMPRESSION: 1. No acute cardiopulmonary disease. 2. Left basilar densities are suggestive for atelectasis or scarring. Electronically Signed   By: Markus Daft M.D.   On: 01/12/2021 13:47    EKG: Independently reviewed.  A. fib rate around 150 bpm.  Assessment/Plan Principal Problem:   Sepsis ([place]) Active Problems:   Acute lower UTI   Diabetes mellitus type 2 in nonobese (H[facility])   Elevated LFTs   ARF (acute renal failure) (H[facility])    Sepsis likely secondary to bacterial prostatitis -I discussed with Dr. Junious Silk on-call urologist who wanted patient to be transferred to [facility].  There is also concern for a clot or mass in the bladder.  After discussing with pharmacy at this time we will be placing patient on meropenem and vancomycin follow cultures continue hydration follow lactic acid levels.  Patient will be kept NPO. A. fib with RVR likely precipitated by sepsis.  New onset.  Heart rate improved with Cardizem bolus.  At this time since patient has possible need for procedure will avoid anticoagulation.  Check TSH and 2D echo follow cardiac markers. Thrombocytopenia likely from sepsis.  Follow CBC.  Any further worsening will need to  work-up for DIC. Possible acute renal failure recent labs done around June 30 was around 1.6.  Likely from obstruction.  Closely monitor. Elevated LFTs could be from sepsis.  Monitor LFTs closely.  CT scan does not show any pathology around the liver or biliary system. Diabetes mellitus type 2 we will keep patient on sliding scale coverage for now.    Since patient has sepsis will need close monitoring and inpatient status.   DVT prophylaxis: SCDs.  Avoiding anticoagulation in anticipation of procedure. Code Status: Full code. Family Communication: Patient's wife at the bedside. Disposition Plan: To be determined. Consults called: Urologist Dr. Junious Silk. Admission status: Inpatient.   Rise Patience MD [facility] Pager [phone].  If 7PM-7AM, please contact night-coverage [email or web address] Password [facility]  01/12/2021, 5:38 PM

## 2021-01-12 NOTE — Progress Notes (Signed)
Patient arrived to unit, responds to pain only. HR 130s, Rapid response Rn contacted to assist.

## 2021-01-12 NOTE — ED Triage Notes (Signed)
Treated for UTI and prostate issues over the past 2 weeks, has had fever as high as 104. Per spouse he is not eating and today is aching all over

## 2021-01-12 NOTE — ED Provider Notes (Signed)
[facility] EMERGENCY DEPARTMENT Provider Note   CSN: [id number] Arrival date & time: 01/12/21  1155     History Chief Complaint  Patient presents with   Groin Pain    Joseph Banks is a 75 y.o. male.   Groin Pain   Patient initially started having symptoms of urinary frequency and discomfort in the middle of June.  Patient had an office visit with his urologist on June 16.  Patient was diagnosed with acute prostatitis and urinary tract infection.  Patient was started on antibiotics.  Patient continued to have some intermittent urinary symptoms as well as a cough.  He also started having fevers as high as 104.   He went to another emergency room on June 30.  CT scan was performed at that time.  He was diagnosed with hemorrhagic cystitis.  Patient was started on ciprofloxacin and discharge.  Patient symptoms have progressed in the last couple of days.Wife states he also been complaining a lot of body aches.  He is a little bit more confused and not acting like his normal self.  He denies any vomiting.  Occasional loose stools.  No shortness of breath.  According to medical records patient was diagnosed with COVID earlier this month  History reviewed. No pertinent past medical history.  Patient Active Problem List   Diagnosis Date Noted   GAD (generalized anxiety disorder) 07/14/2018   Bipolar disorder ([place]) 07/14/2018    History reviewed. No pertinent surgical history.     No family history on file.  Social History   Tobacco Use   Smoking status: Former    Pack years: 0.00   Smokeless tobacco: Never    Home Medications Prior to Admission medications   Medication Sig Start Date End Date Taking? Authorizing Provider  gabapentin (NEURONTIN) 600 MG tablet Take 1 tablet (600 mg total) by mouth 2 (two) times daily. 12/22/20   Cottle, Billey Co., MD  metFORMIN (GLUCOPHAGE) 500 MG tablet Take 500 mg by mouth 2 (two) times daily. 02/07/19   [provider]   Potassium 99 MG TABS Take by mouth.    [provider]  sulfamethoxazole-trimethoprim (BACTRIM DS) 800-160 MG tablet Take 1 tablet by mouth every 12 (twelve) hours. 12/25/20   Irine Seal, MD  tadalafil (CIALIS) 5 MG tablet Take 1 tablet (5 mg total) by mouth daily. 12/16/20   Franchot Gallo, MD  tamsulosin [facility]) 0.4 MG CAPS capsule Take 2 caps daily 12/16/20   Franchot Gallo, MD  traZODone (DESYREL) 50 MG tablet Take 1-2 tablets (50-100 mg total) by mouth at bedtime as needed for sleep. 12/22/20   Cottle, Billey Co., MD    Allergies    Patient has no known allergies.  Review of Systems   Review of Systems  All other systems reviewed and are negative.  Physical Exam Updated Vital Signs BP 134/70   Pulse 97   Temp 98.4 F (36.9 C)   Resp (!) 24   Ht 1.905 m (6\' 3" )   SpO2 99%   BMI 30.00 kg/m   Physical Exam Vitals and nursing note reviewed.  Constitutional:      Appearance: He is well-developed. He is not diaphoretic.  HENT:     Head: Normocephalic and atraumatic.     Right Ear: External ear normal.     Left Ear: External ear normal.  Eyes:     General: No scleral icterus.       Right eye: No discharge.  Left eye: No discharge.     Conjunctiva/sclera: Conjunctivae normal.  Neck:     Trachea: No tracheal deviation.  Cardiovascular:     Rate and Rhythm: Normal rate and regular rhythm.  Pulmonary:     Effort: Pulmonary effort is normal. No respiratory distress.     Breath sounds: Normal breath sounds. No stridor. No wheezing or rales.  Abdominal:     General: Bowel sounds are normal. There is no distension.     Palpations: Abdomen is soft.     Tenderness: There is no abdominal tenderness. There is no guarding or rebound.  Genitourinary:    Comments: No tenderness inguinal region bilaterally Musculoskeletal:        General: No tenderness or deformity.     Cervical back: Neck supple.  Skin:    General: Skin is warm and dry.     Findings: No  rash.  Neurological:     General: No focal deficit present.     Mental Status: He is alert.     Cranial Nerves: No cranial nerve deficit (no facial droop, extraocular movements intact, no slurred speech).     Sensory: No sensory deficit.     Motor: No abnormal muscle tone or seizure activity.     Coordination: Coordination normal.  Psychiatric:        Mood and Affect: Mood normal.    ED Results / Procedures / Treatments   Labs (all labs ordered are listed, but only abnormal results are displayed) Labs Reviewed  LACTIC ACID, PLASMA - Abnormal; Notable for the following components:      Result Value   Lactic Acid, Venous 2.0 (*)    All other components within normal limits  LACTIC ACID, PLASMA - Abnormal; Notable for the following components:   Lactic Acid, Venous 3.2 (*)    All other components within normal limits  COMPREHENSIVE METABOLIC PANEL - Abnormal; Notable for the following components:   Sodium 132 (*)    CO2 20 (*)    Glucose, Bld 142 (*)    BUN 24 (*)    Creatinine, Ser 1.26 (*)    Calcium 8.2 (*)    Total Protein 6.1 (*)    Albumin 2.6 (*)    AST 181 (*)    ALT 107 (*)    GFR, Estimated 60 (*)    All other components within normal limits  CBC WITH DIFFERENTIAL/PLATELET - Abnormal; Notable for the following components:   RBC 3.70 (*)    Hemoglobin 11.8 (*)    HCT 33.8 (*)    Platelets 49 (*)    All other components within normal limits  URINALYSIS, ROUTINE W REFLEX MICROSCOPIC - Abnormal; Notable for the following components:   Color, Urine AMBER (*)    APPearance HAZY (*)    Glucose, UA 50 (*)    Hgb urine dipstick LARGE (*)    Ketones, ur 5 (*)    Protein, ur 100 (*)    Nitrite POSITIVE (*)    RBC / HPF >50 (*)    Bacteria, UA FEW (*)    All other components within normal limits  CBC WITH DIFFERENTIAL/PLATELET - Abnormal; Notable for the following components:   RBC 3.42 (*)    Hemoglobin 10.7 (*)    HCT 30.4 (*)    Platelets 60 (*)    All other  components within normal limits  LACTIC ACID, PLASMA - Abnormal; Notable for the following components:   Lactic Acid, Venous 3.0 (*)  All other components within normal limits  GLUCOSE, CAPILLARY - Abnormal; Notable for the following components:   Glucose-Capillary 129 (*)    All other components within normal limits  COMPREHENSIVE METABOLIC PANEL - Abnormal; Notable for the following components:   Sodium 131 (*)    CO2 21 (*)    Glucose, Bld 141 (*)    Calcium 7.7 (*)    Total Protein 5.2 (*)    Albumin 2.1 (*)    AST 150 (*)    ALT 86 (*)    All other components within normal limits  GLUCOSE, CAPILLARY - Abnormal; Notable for the following components:   Glucose-Capillary 132 (*)    All other components within normal limits  GLUCOSE, CAPILLARY - Abnormal; Notable for the following components:   Glucose-Capillary 155 (*)    All other components within normal limits  GLUCOSE, CAPILLARY - Abnormal; Notable for the following components:   Glucose-Capillary 158 (*)    All other components within normal limits  GLUCOSE, CAPILLARY - Abnormal; Notable for the following components:   Glucose-Capillary 136 (*)    All other components within normal limits  CBG MONITORING, ED - Abnormal; Notable for the following components:   Glucose-Capillary 133 (*)    All other components within normal limits  CULTURE, BLOOD (SINGLE)  RESP PANEL BY RT-PCR (FLU A&B, COVID) ARPGX2  MRSA NEXT GEN BY PCR, NASAL  URINE CULTURE  PROTIME-INR  APTT  PROTIME-INR  APTT  MAGNESIUM  BASIC METABOLIC PANEL  MAGNESIUM  LACTIC ACID, PLASMA    EKG EKG Interpretation  Date/Time:  Monday January 12 2021 13:07:17 EDT Ventricular Rate:  96 PR Interval:  188 QRS Duration: 93 QT Interval:  326 QTC Calculation: 412 R Axis:   79 Text Interpretation: Sinus rhythm Baseline wander in lead(s) II III aVF V4 No old tracing to compare Confirmed by Dorie Rank [phone]) on 01/12/2021 1:49:21 PM  Radiology No results  found.  Procedures .Critical Care  Date/Time: 01/13/2021 8:42 AM Performed by: Dorie Rank, MD Authorized by: Dorie Rank, MD   Critical care provider statement:    Critical care time (minutes):  45   Critical care was time spent personally by me on the following activities:  Discussions with consultants, evaluation of patient's response to treatment, examination of patient, ordering and performing treatments and interventions, ordering and review of laboratory studies, ordering and review of radiographic studies, pulse oximetry, re-evaluation of patient's condition, obtaining history from patient or surrogate and review of old charts   Medications Ordered in ED Medications  lactated ringers bolus 1,000 mL (has no administration in time range)    ED Course  I have reviewed the triage vital signs and the nursing notes.  Pertinent labs & imaging results that were available during my care of the patient were reviewed by me and considered in my medical decision making (see chart for details).  Clinical Course as of 01/13/21 0839  Mon Jan 12, 2021  1348 Lactic acid level slightly elevated.  BUN and creatinine are elevated [JK]  1348 LFTs are also elevated [JK]  1459 Urinalysis does show positive nitrite and greater than 50 RBCs only 6-10 white blood cells. [SW]  5462 With the elevated LFTs and persistent fever we will proceed with CT scan [JK]    Clinical Course User Index [JK] Dorie Rank, MD   MDM Rules/Calculators/A&P  Pt presents with persistent fever, recent dx of prostatitis, uti.  Continue to have fever despite abx.  In the ED initial vital sign stable however elevated lactic acid level.  Pt given 30 cc/kg bolus, iv abx.  Concern for evolving sepsis.  New elevation lfts.  CT scan ordered for further eval. Plan no admission for further treatment.  CT scan pending at shift change.  Dr Roderic Palau to follow up on results. Final Clinical Impression(s) / ED  Diagnoses Pending at shift change    Dorie Rank, MD 01/13/21 [phone]

## 2021-01-12 NOTE — Progress Notes (Signed)
Pharmacy Antibiotic Note  Joseph Banks is a 75 y.o. male admitted on 01/12/2021 with sepsis.  Pharmacy has been consulted for Vanco, Merrem dosing.  CC/HPI: Loss of appetite with body aches. Sepsis. Fever, thrombocytopenia, elevated lactate, elevated LFT's.  Recent prostatitis/UTI s/p 2 weeks of Cipro and Septra.  PMH: Recent prostatitis, R epididymitis, BPH, BOO,  ID: Temp 102.2. WBC 6. LA 3.2 rising.  Recent prostatitis/UTI s/p 2 weeks of Cipro and Septra.  6/16 UCx: Ecoli pan sens.  Merrem 7/4>> Vanco 7/4>>  Plan: Merrem 1g IV q8hr. Vancomycin 2000 mg IV Q 24 hrs. Goal AUC 400-550. Expected AUC: 495.2 SCr used: 1.26 - Vanco peak/trough at steady state for AUC calculations if continued      Height: 6\' 3"  (190.5 cm) Weight: 101.2 kg (223 lb) IBW/kg (Calculated) : 84.5  Temp (24hrs), Avg:100.3 F (37.9 C), Min:98.4 F (36.9 C), Max:102.2 F (39 C)  Recent Labs  Lab 01/12/21 1257 01/12/21 1506  WBC 6.0  --   CREATININE 1.26*  --   LATICACIDVEN 2.0* 3.2*    Estimated Creatinine Clearance: 61.5 mL/min (A) (by C-G formula based on SCr of 1.26 mg/dL (H)).    No Known Allergies  Joseph Banks S. Alford Highland, PharmD, BCPS Clinical Staff Pharmacist [email or web address]  Joseph Banks 01/12/2021 5:28 PM

## 2021-01-12 NOTE — Consult Note (Signed)
NAME:  Joseph Banks, MRN:  [id number], DOB:  [birth year], LOS: 0 ADMISSION DATE:  01/12/2021, CONSULTATION DATE:  01/12/2021  REFERRING MD:  Dr. Olena Heckle, CHIEF COMPLAINT:  Sepsis    History of Present Illness:  This is a 75 year old gentleman, past medical history of diabetes, BPH, TURP 5 years ago.  Presents to emergency department with urinary hesitancy, dysuria.  He was treated for prostatitis with oral antibiotics.  Ultimately come back to the emergency department more disoriented.  Was found to have lactic acidosis, atrial fibrillation and concern for sepsis.  Patient was transferred from [facility], ER to [facility].  Initially admitted to the [facility] hospitalist service and ultimately transferred to the ICU this evening for progressive confusion and fever of greater than 104.  Patient has received 4 L of fluid and remains on IV antibiotics he has had been able to patient had CT scan completed at [facility] with masslike density within the bladder, possible clot.  Pertinent  Medical History   Past Medical History:  Diagnosis Date   BPH (benign prostatic hyperplasia)    Diabetes mellitus without complication ([place])      Significant Hospital Events: Including procedures, antibiotic start and stop dates in addition to other pertinent events   Patient reports her ICU admission  Interim History / Subjective:  Her HPI above  Objective   Blood pressure (!) 154/133, pulse (!) 119, temperature (!) 104.4 F (40.2 C), temperature source Rectal, resp. rate (!) 33, height _0  (1.905 m), weight 101.2 kg, SpO2 97 %.        Intake/Output Summary (Last 24 hours) at 01/12/2021 2122 Last data filed at 01/12/2021 1715 Gross per 24 hour  Intake 3100 ml  Output --  Net 3100 ml   Filed Weights   01/12/21 1700 01/12/21 1713  Weight: 101.2 kg 101.2 kg    Examination: General: Elderly male resting comfortably in bed awake, responds to stimulation HENT: NCAT, tracking appropriately Lungs: Clear to  auscultation bilaterally no crackles no wheeze Cardiovascular: Tachycardic, irregular Abdomen: Soft, nontender nondistended Extremities: No significant edema Neuro: Alert oriented, follows commands GU: Normal penis,, no discharge, no groin erythema, examined alongside urologist, present at [facility] Problem list     Assessment & Plan:   Severe sepsis Lactic acidosis Concern for prostatitis, cystitis Acute urinary tract infection Bladder outlet obstruction History of TURP BPH Acute pain Atrial fibrillation Plan: Continue IV antibiotics, continue meropenem which was started earlier Patient has received adequate fluid resuscitation Pending culture results can de-escalate, urine and blood. Urology has been consulted Attempt was made for Foley catheter placement however was met with resistance. Urology is planning to scope at bedside and place Foley. A. fib rates are less than 120, hold off on any rate control at this time Focus on treating patient's pain and drainage of bladder first.   Best Practice (right click and "Reselect all SmartList Selections" daily)   Diet/type: NPO DVT prophylaxis: other holding anticoagulation plans for urology to scope at bedside GI prophylaxis: N/A Lines: N/A Foley:  Yes, and it is still needed, pending placement by urology Code Status:  full code Last date of multidisciplinary goals of care discussion [n/a]  Labs   CBC: Recent Labs  Lab 01/12/21 1257  WBC 6.0  NEUTROABS 4.8  HGB 11.8*  HCT 33.8*  MCV 91.4  PLT 49*    Basic Metabolic Panel: Recent Labs  Lab 01/12/21 1257 01/12/21 1944  NA 132* 131*  K 4.3 4.2  CL 100 100  CO2 20* 21*  GLUCOSE 142* 141*  BUN 24* 22  CREATININE 1.26* 1.11  CALCIUM 8.2* 7.7*  MG  --  1.7   GFR: Estimated Creatinine Clearance: 69.8 mL/min (by C-G formula based on SCr of 1.11 mg/dL). Recent Labs  Lab 01/12/21 1257 01/12/21 1506 01/12/21 1807  WBC 6.0  --   --    LATICACIDVEN 2.0* 3.2* 3.0*    Liver Function Tests: Recent Labs  Lab 01/12/21 1257 01/12/21 1944  AST 181* 150*  ALT 107* 86*  ALKPHOS 90 69  BILITOT 0.9 0.8  PROT 6.1* 5.2*  ALBUMIN 2.6* 2.1*   No results for input(s): LIPASE, AMYLASE in the last 168 hours. No results for input(s): AMMONIA in the last 168 hours.  ABG No results found for: PHART, PCO2ART, PO2ART, HCO3, TCO2, ACIDBASEDEF, O2SAT   Coagulation Profile: Recent Labs  Lab 01/12/21 1257 01/12/21 1807  INR 1.0 1.0    Cardiac Enzymes: No results for input(s): CKTOTAL, CKMB, CKMBINDEX, TROPONINI in the last 168 hours.  HbA1C: No results found for: HGBA1C  CBG: Recent Labs  Lab 01/12/21 1801 01/12/21 1931 01/12/21 2044  GLUCAP 133* 129* 132*    Review of Systems:   Critically ill, unable to obtain full review of system.  Patient in acute pain and confused  Past Medical History:  He,  has a past medical history of BPH (benign prostatic hyperplasia) and Diabetes mellitus without complication ([place]).   Surgical History:   Past Surgical History:  Procedure Laterality Date   TRANSURETHRAL RESECTION OF PROSTATE       Social History:   reports that he has quit smoking. He has never used smokeless tobacco.   Family History:  His family history is not on file.   Allergies No Known Allergies   Home Medications  Prior to Admission medications   Medication Sig Start Date End Date Taking? Authorizing Provider  gabapentin (NEURONTIN) 600 MG tablet Take 1 tablet (600 mg total) by mouth 2 (two) times daily. 12/22/20  Yes Cottle, Billey Co., MD  metFORMIN (GLUCOPHAGE) 500 MG tablet Take 500 mg by mouth 2 (two) times daily. 02/07/19   [provider]  Potassium 99 MG TABS Take by mouth. Patient not taking: Reported on 01/12/2021    [provider]  sulfamethoxazole-trimethoprim (BACTRIM DS) 800-160 MG tablet Take 1 tablet by mouth every 12 (twelve) hours. 12/25/20   Irine Seal, MD   tadalafil (CIALIS) 5 MG tablet Take 1 tablet (5 mg total) by mouth daily. 12/16/20   Franchot Gallo, MD  tamsulosin [facility]) 0.4 MG CAPS capsule Take 2 caps daily 12/16/20   Franchot Gallo, MD  traZODone (DESYREL) 50 MG tablet Take 1-2 tablets (50-100 mg total) by mouth at bedtime as needed for sleep. 12/22/20   Cottle, Billey Co., MD     Critical care time:  This patient is critically ill with multiple organ system failure; which, requires frequent high complexity decision making, assessment, support, evaluation, and titration of therapies. This was completed through the application of advanced monitoring technologies and extensive interpretation of multiple databases. During this encounter critical care time was devoted to patient care services described in this note for 34 minutes.       [place] 01/12/2021 9:22 PM

## 2021-01-12 NOTE — Consult Note (Addendum)
Consultation: Bladder mass versus clot, BPH, sepsis Requested by: Dr. Gean Birchwood  History of Present Illness: Mr. Joseph Banks is a 75 year old male that follows with Dr. Diona Fanti for BPH and elevated PSA.  He was seen earlier in June with worsening lower urinary tract symptoms and increased his tamsulosin to twice a day.  UA was clear at that time.  About 10 days later on 6/16 he saw Dr. Jeffie Pollock in the office with worsening lower urinary tract symptoms and post void had increased to 238 mL.  He had a temperature of 100 and the right epididymis was thought to be swollen.  Urine culture grew E. coli pansensitive and he was given a dose of Rocephin and started on Bactrim for 2 weeks.  He then developed gross hematuria and went to [facility] 6/30 when again his UA looked infected.  His creatinine rose to 1.65 from baseline of 1.11.  His white count was 3.5.  His temperature there was 98 degrees and he was put on ciprofloxacin.  CT scan of the abdomen and pelvis was done which showed about 150 g prostate and heterogeneous material in the bladder possibly clot versus mass.  He was discharged.  He then presented to [facility] today with fever up to 102, hematuria and body aches.  Wife thought he also was a bit confused.  CT scan was repeated and again prostate looked to be about 150 g with continued heterogeneous bladder mass possibly clot and I thought the bladder looked relatively distended was some bilateral pelvocaliectasis and hydroureter.  His white count was only 6 and creatinine 1.26.  Lactic acid 2.0.  UA showed a few bacteria.  Dr. Hal Hope put patient on vancomycin and meropenem and he was transferred to [facility] in the event the need needed clot evacuation or some other urologic procedure.  I reviewed the CT scan with the radiologist and no other concerning findings were noted.  Here he has been febrile to 104.4 and heart rate in the 130s.  Hospitalist has consulted CCM.  Of note patient has a  long history of BPH with a TURP in 2013.  He was on 5 alpha reductase inhibitor for years but stopped that around 2020 or 2 years ago.  He has a history of elevated PSA of 9-11 range and 3 prior biopsies at other institutions.  His PSA was as low as 4.4 on a 5 alpha reductase inhibitor.   Past Medical History:  Diagnosis Date   Diabetes mellitus without complication ([place])    History reviewed. No pertinent surgical history.  Home Medications:  Medications Prior to Admission  Medication Sig Dispense Refill Last Dose   gabapentin (NEURONTIN) 600 MG tablet Take 1 tablet (600 mg total) by mouth 2 (two) times daily. 180 tablet 2 Past Week   metFORMIN (GLUCOPHAGE) 500 MG tablet Take 500 mg by mouth 2 (two) times daily.      Potassium 99 MG TABS Take by mouth. (Patient not taking: Reported on 01/12/2021)   Not Taking   sulfamethoxazole-trimethoprim (BACTRIM DS) 800-160 MG tablet Take 1 tablet by mouth every 12 (twelve) hours. 28 tablet 0    tadalafil (CIALIS) 5 MG tablet Take 1 tablet (5 mg total) by mouth daily. 30 tablet 11    tamsulosin (FLOMAX) 0.4 MG CAPS capsule Take 2 caps daily 180 capsule 3    traZODone (DESYREL) 50 MG tablet Take 1-2 tablets (50-100 mg total) by mouth at bedtime as needed for sleep. 180 tablet 3  Allergies: No Known Allergies  History reviewed. No pertinent family history. Social History:  reports that he has quit smoking. He has never used smokeless tobacco. No history on file for alcohol use and drug use.  ROS: A complete review of systems was performed.  All systems are negative except for pertinent findings as noted. Review of Systems  Unable to perform ROS: Acuity of condition    Physical Exam:  Vital signs in last 24 hours: Temp:  [98.4 F (36.9 C)-104.4 F (40.2 C)] 104.4 F (40.2 C) (07/04 2000) Pulse Rate:  [91-148] 119 (07/04 1730) Resp:  [20-33] 33 (07/04 1730) BP: (117-166)/(53-133) 154/133 (07/04 1730) SpO2:  [94 %-100 %] 97 % (07/04  1630) Weight:  [101.2 kg] 101.2 kg (07/04 1713) General:  HEENT: Normocephalic, atraumatic Neck: No JVD or lymphadenopathy Cardiovascular: Regular rate and rhythm Lungs: Regular rate and effort Abdomen: Soft, nontender, nondistended, no abdominal masses, bladder mildly distended. Inguinal regions normal without cellulitis or mass. Non-tender.  Extremities: No edema Neurologic: Grossly intact GU: Glans and meatus appear normal.  Penis appears normal.  Scrotum appears normal.  No cellulitis.  Testicles are palpably normal.  Epididymides palpably normal.  Perineum palpably normal and visibly without cellulitis.  Nontender.  On digital rectal exam, large prostate, smooth and rubbery without fluctuance and nontender  Procedure: The penis was prepped and draped in the usual sterile fashion and with the assistance of nurse and nurse practitioner I tried to pass an 66 [place] straight catheter which would not pass through the prostate and then tried to pass an 76 [place] coud catheter which also would not make the turn up through the prostate.  I went out to the lobby to speak to his wife and family and went over the nature risk benefits and alternatives to cystoscopy with passage of a catheter over a wire and they granted consent to proceed.  He was prepped again after I went and retrieved the catheter cart from the operating room.  Flexible cystoscopy now was done easily through a very elongated prostate with lateral lobe hypertrophy.  I could not get a real clear view of the bladder but did not see any obvious mucosal lesions, it looked to me like there was intravesical extension of the prostate but no specific median lobe.  There was what looked like old tan appearing clot.  I passed a sensor wire and backed the scope out.  I then passed a 98 [place] council tip into the bladder.  Wire was removed and bladder began to drain.  Patient almost immediately became more calm.  Balloon inflated to 18 cc and seated at  the bladder neck.  Most of the catheter is in indicating a long prostatic urethra.  I then irrigated the catheter and irrigated a good amount of old clot.  Urine was clear after irrigation.  Laboratory Data:  Results for orders placed or performed during the hospital encounter of 01/12/21 (from the past 24 hour(s))  Lactic acid, plasma     Status: Abnormal   Collection Time: 01/12/21 12:57 PM  Result Value Ref Range   Lactic Acid, Venous 2.0 (HH) 0.5 - 1.9 mmol/L  Comprehensive metabolic panel     Status: Abnormal   Collection Time: 01/12/21 12:57 PM  Result Value Ref Range   Sodium 132 (L) 135 - 145 mmol/L   Potassium 4.3 3.5 - 5.1 mmol/L   Chloride 100 98 - 111 mmol/L   CO2 20 (L) 22 - 32 mmol/L   Glucose,  Bld 142 (H) 70 - 99 mg/dL   BUN 24 (H) 8 - 23 mg/dL   Creatinine, Ser 1.26 (H) 0.61 - 1.24 mg/dL   Calcium 8.2 (L) 8.9 - 10.3 mg/dL   Total Protein 6.1 (L) 6.5 - 8.1 g/dL   Albumin 2.6 (L) 3.5 - 5.0 g/dL   AST 181 (H) 15 - 41 U/L   ALT 107 (H) 0 - 44 U/L   Alkaline Phosphatase 90 38 - 126 U/L   Total Bilirubin 0.9 0.3 - 1.2 mg/dL   GFR, Estimated 60 (L) >60 mL/min   Anion gap 12 5 - 15  CBC WITH DIFFERENTIAL     Status: Abnormal   Collection Time: 01/12/21 12:57 PM  Result Value Ref Range   WBC 6.0 4.0 - 10.5 K/uL   RBC 3.70 (L) 4.22 - 5.81 MIL/uL   Hemoglobin 11.8 (L) 13.0 - 17.0 g/dL   HCT 33.8 (L) 39.0 - 52.0 %   MCV 91.4 80.0 - 100.0 fL   MCH 31.9 26.0 - 34.0 pg   MCHC 34.9 30.0 - 36.0 g/dL   RDW 14.4 11.5 - 15.5 %   Platelets 49 (L) 150 - 400 K/uL   nRBC 0.0 0.0 - 0.2 %   Neutrophils Relative % 79 %   Neutro Abs 4.8 1.7 - 7.7 K/uL   Lymphocytes Relative 16 %   Lymphs Abs 0.9 0.7 - 4.0 K/uL   Monocytes Relative 3 %   Monocytes Absolute 0.2 0.1 - 1.0 K/uL   Eosinophils Relative 0 %   Eosinophils Absolute 0.0 0.0 - 0.5 K/uL   Basophils Relative 1 %   Basophils Absolute 0.0 0.0 - 0.1 K/uL   Immature Granulocytes 1 %   Abs Immature Granulocytes 0.03 0.00 - 0.07  K/uL  Protime-INR     Status: None   Collection Time: 01/12/21 12:57 PM  Result Value Ref Range   Prothrombin Time 13.6 11.4 - 15.2 seconds   INR 1.0 0.8 - 1.2  APTT     Status: None   Collection Time: 01/12/21 12:57 PM  Result Value Ref Range   aPTT 32 24 - 36 seconds  Blood culture (routine single)     Status: None (Preliminary result)   Collection Time: 01/12/21 12:57 PM   Specimen: Right Antecubital; Blood  Result Value Ref Range   Specimen Description      RIGHT ANTECUBITAL BOTTLES DRAWN AEROBIC AND ANAEROBIC   Special Requests      Blood Culture adequate volume Performed at [facility], [address] [place], [place] [address]    Culture PENDING    Report Status PENDING   Urinalysis, Routine w reflex microscopic Urine, Clean Catch     Status: Abnormal   Collection Time: 01/12/21  1:08 PM  Result Value Ref Range   Color, Urine AMBER (A) YELLOW   APPearance HAZY (A) CLEAR   Specific Gravity, Urine 1.025 1.005 - 1.030   pH 6.0 5.0 - 8.0   Glucose, UA 50 (A) NEGATIVE mg/dL   Hgb urine dipstick LARGE (A) NEGATIVE   Bilirubin Urine NEGATIVE NEGATIVE   Ketones, ur 5 (A) NEGATIVE mg/dL   Protein, ur 100 (A) NEGATIVE mg/dL   Nitrite POSITIVE (A) NEGATIVE   Leukocytes,Ua NEGATIVE NEGATIVE   RBC / HPF >50 (H) 0 - 5 RBC/hpf   WBC, UA 6-10 0 - 5 WBC/hpf   Bacteria, UA FEW (A) NONE SEEN   Squamous Epithelial / LPF 0-5 0 - 5  Resp Panel by  RT-PCR (Flu A&B, Covid) Nasopharyngeal Swab     Status: None   Collection Time: 01/12/21  1:09 PM   Specimen: Nasopharyngeal Swab; Nasopharyngeal(NP) swabs in vial transport medium  Result Value Ref Range   SARS Coronavirus 2 by RT PCR NEGATIVE NEGATIVE   Influenza A by PCR NEGATIVE NEGATIVE   Influenza B by PCR NEGATIVE NEGATIVE  Lactic acid, plasma     Status: Abnormal   Collection Time: 01/12/21  3:06 PM  Result Value Ref Range   Lactic Acid, Venous 3.2 (HH) 0.5 - 1.9 mmol/L  CBG monitoring, ED     Status: Abnormal   Collection Time:  01/12/21  6:01 PM  Result Value Ref Range   Glucose-Capillary 133 (H) 70 - 99 mg/dL  Lactic acid, plasma     Status: Abnormal   Collection Time: 01/12/21  6:07 PM  Result Value Ref Range   Lactic Acid, Venous 3.0 (HH) 0.5 - 1.9 mmol/L  Protime-INR     Status: None   Collection Time: 01/12/21  6:07 PM  Result Value Ref Range   Prothrombin Time 13.5 11.4 - 15.2 seconds   INR 1.0 0.8 - 1.2  APTT     Status: None   Collection Time: 01/12/21  6:07 PM  Result Value Ref Range   aPTT 31 24 - 36 seconds  Glucose, capillary     Status: Abnormal   Collection Time: 01/12/21  7:31 PM  Result Value Ref Range   Glucose-Capillary 129 (H) 70 - 99 mg/dL  Magnesium     Status: None   Collection Time: 01/12/21  7:44 PM  Result Value Ref Range   Magnesium 1.7 1.7 - 2.4 mg/dL   Recent Results (from the past 240 hour(s))  Blood culture (routine single)     Status: None (Preliminary result)   Collection Time: 01/12/21 12:57 PM   Specimen: Right Antecubital; Blood  Result Value Ref Range Status   Specimen Description   Final    RIGHT ANTECUBITAL BOTTLES DRAWN AEROBIC AND ANAEROBIC   Special Requests   Final    Blood Culture adequate volume Performed at [facility], [address] [place], [place] [address]    Culture PENDING  Incomplete   Report Status PENDING  Incomplete  Resp Panel by RT-PCR (Flu A&B, Covid) Nasopharyngeal Swab     Status: None   Collection Time: 01/12/21  1:09 PM   Specimen: Nasopharyngeal Swab; Nasopharyngeal(NP) swabs in vial transport medium  Result Value Ref Range Status   SARS Coronavirus 2 by RT PCR NEGATIVE NEGATIVE Final    Comment: (NOTE) SARS-CoV-2 target nucleic acids are NOT DETECTED.  The SARS-CoV-2 RNA is generally detectable in upper respiratory specimens during the acute phase of infection. The lowest concentration of SARS-CoV-2 viral copies this assay can detect is 138 copies/mL. A negative result does not preclude SARS-Cov-2 infection and should not be  used as the sole basis for treatment or other patient management decisions. A negative result may occur with  improper specimen collection/handling, submission of specimen other than nasopharyngeal swab, presence of viral mutation(s) within the areas targeted by this assay, and inadequate number of viral copies(<138 copies/mL). A negative result must be combined with clinical observations, patient history, and epidemiological information. The expected result is Negative.  Fact Sheet for Patients:  EntrepreneurPulse.com.au  Fact Sheet for Healthcare Providers:  IncredibleEmployment.be  This test is no t yet approved or cleared by the [place] FDA and  has been authorized for detection and/or diagnosis of  SARS-CoV-2 by FDA under an Emergency Use Authorization (EUA). This EUA will remain  in effect (meaning this test can be used) for the duration of the COVID-19 declaration under Section 564(b)(1) of the Act, 21 U.S.C.section 360bbb-3(b)(1), unless the authorization is terminated  or revoked sooner.       Influenza A by PCR NEGATIVE NEGATIVE Final   Influenza B by PCR NEGATIVE NEGATIVE Final    Comment: (NOTE) The Xpert Xpress SARS-CoV-2/FLU/RSV plus assay is intended as an aid in the diagnosis of influenza from Nasopharyngeal swab specimens and should not be used as a sole basis for treatment. Nasal washings and aspirates are unacceptable for Xpert Xpress SARS-CoV-2/FLU/RSV testing.  Fact Sheet for Patients: EntrepreneurPulse.com.au  Fact Sheet for Healthcare Providers: IncredibleEmployment.be  This test is not yet approved or cleared by the [place] FDA and has been authorized for detection and/or diagnosis of SARS-CoV-2 by FDA under an Emergency Use Authorization (EUA). This EUA will remain in effect (meaning this test can be used) for the duration of the COVID-19 declaration under Section  564(b)(1) of the Act, 21 U.S.C. section 360bbb-3(b)(1), unless the authorization is terminated or revoked.  Performed at [facility], [address] [place], [place] [address]    Creatinine: Recent Labs    01/12/21 1257  CREATININE 1.26*    Impression/Assessment/plan:  Sepsis-presumably urinary in source.  Spoke with Dr. Valeta Harms and hopefully we can catch up with the broad-spectrum antibiotics.  He reports fever can be high with gram-negatives. No obvious abscess or other findings on the CT scan, cysto or exam.  BPH-he is on tamsulosin twice daily.  I discussed with his wife the nature risk benefits and alternatives to going ahead and adding back finasteride.  We discussed FDA warnings.  She elected to proceed.  This may help when it comes time for voiding trial.  Gross hematuria-this may be related to infection.  I did not see any obvious neoplasm but he might benefit from cystoscopy as an outpatient once he is recovered and alert and oriented.  Continue Foley catheter to gravity drainage and hand irrigate as needed.  I will notify Dr. Diona Fanti of admission.   Festus Aloe 01/12/2021, 8:32 PM

## 2021-01-12 NOTE — ED Notes (Signed)
Attempted to call report to receiving RN at [facility]; Amber, RN to call this nurse when available to take report.

## 2021-01-13 ENCOUNTER — Inpatient Hospital Stay (HOSPITAL_COMMUNITY): Payer: Medicare HMO

## 2021-01-13 ENCOUNTER — Ambulatory Visit: Payer: Medicare HMO | Admitting: Urology

## 2021-01-13 DIAGNOSIS — R6520 Severe sepsis without septic shock: Secondary | ICD-10-CM | POA: Diagnosis not present

## 2021-01-13 DIAGNOSIS — A419 Sepsis, unspecified organism: Secondary | ICD-10-CM | POA: Diagnosis not present

## 2021-01-13 LAB — GLUCOSE, CAPILLARY
Glucose-Capillary: 158 mg/dL — ABNORMAL HIGH (ref 70–99)
Glucose-Capillary: 136 mg/dL — ABNORMAL HIGH (ref 70–99)
Glucose-Capillary: 129 mg/dL — ABNORMAL HIGH (ref 70–99)
Glucose-Capillary: 127 mg/dL — ABNORMAL HIGH (ref 70–99)
Glucose-Capillary: 178 mg/dL — ABNORMAL HIGH (ref 70–99)
Glucose-Capillary: 133 mg/dL — ABNORMAL HIGH (ref 70–99)

## 2021-01-13 LAB — BASIC METABOLIC PANEL
Anion gap: 10 (ref 5–15)
BUN: 22 mg/dL (ref 8–23)
CO2: 21 mmol/L — ABNORMAL LOW (ref 22–32)
Calcium: 8 mg/dL — ABNORMAL LOW (ref 8.9–10.3)
Chloride: 104 mmol/L (ref 98–111)
Creatinine, Ser: 1.04 mg/dL (ref 0.61–1.24)
GFR, Estimated: >60 mL/min (ref >60)
Glucose, Bld: 140 mg/dL — ABNORMAL HIGH (ref 70–99)
Potassium: 3.8 mmol/L (ref 3.5–5.1)
Sodium: 135 mmol/L (ref 135–145)

## 2021-01-13 LAB — CBC WITH DIFFERENTIAL/PLATELET
Abs Immature Granulocytes: 0.05 10*3/uL (ref 0.00–0.07)
Basophils Absolute: 0 10*3/uL (ref 0.0–0.1)
Basophils Relative: 1 %
Eosinophils Absolute: 0 10*3/uL (ref 0.0–0.5)
Eosinophils Relative: 0 %
HCT: 30.4 % — ABNORMAL LOW (ref 39.0–52.0)
Hemoglobin: 10.7 g/dL — ABNORMAL LOW (ref 13.0–17.0)
Immature Granulocytes: 1 %
Lymphocytes Relative: 32 %
Lymphs Abs: 2.5 10*3/uL (ref 0.7–4.0)
MCH: 31.3 pg (ref 26.0–34.0)
MCHC: 35.2 g/dL (ref 30.0–36.0)
MCV: 88.9 fL (ref 80.0–100.0)
Monocytes Absolute: 0.3 10*3/uL (ref 0.1–1.0)
Monocytes Relative: 4 %
Neutro Abs: 4.8 10*3/uL (ref 1.7–7.7)
Neutrophils Relative %: 62 %
Platelets: 60 10*3/uL — ABNORMAL LOW (ref 150–400)
RBC: 3.42 MIL/uL — ABNORMAL LOW (ref 4.22–5.81)
RDW: 14.6 % (ref 11.5–15.5)
WBC: 7.7 10*3/uL (ref 4.0–10.5)
nRBC: 0 % (ref 0.0–0.2)

## 2021-01-13 LAB — COMPREHENSIVE METABOLIC PANEL
ALT: 87 U/L — ABNORMAL HIGH (ref 0–44)
AST: 156 U/L — ABNORMAL HIGH (ref 15–41)
Albumin: 2 g/dL — ABNORMAL LOW (ref 3.5–5.0)
Alkaline Phosphatase: 66 U/L (ref 38–126)
Anion gap: 10 (ref 5–15)
BUN: 22 mg/dL (ref 8–23)
CO2: 20 mmol/L — ABNORMAL LOW (ref 22–32)
Calcium: 7.8 mg/dL — ABNORMAL LOW (ref 8.9–10.3)
Chloride: 103 mmol/L (ref 98–111)
Creatinine, Ser: 1.08 mg/dL (ref 0.61–1.24)
GFR, Estimated: >60 mL/min (ref >60)
Glucose, Bld: 150 mg/dL — ABNORMAL HIGH (ref 70–99)
Potassium: 4.3 mmol/L (ref 3.5–5.1)
Sodium: 133 mmol/L — ABNORMAL LOW (ref 135–145)
Total Bilirubin: 1 mg/dL (ref 0.3–1.2)
Total Protein: 5.2 g/dL — ABNORMAL LOW (ref 6.5–8.1)

## 2021-01-13 LAB — LACTIC ACID, PLASMA
Lactic Acid, Venous: 2.5 mmol/L (ref 0.5–1.9)
Lactic Acid, Venous: 2.9 mmol/L (ref 0.5–1.9)

## 2021-01-13 LAB — MAGNESIUM: Magnesium: 1.8 mg/dL (ref 1.7–2.4)

## 2021-01-13 MED ORDER — GABAPENTIN 250 MG/5ML PO SOLN
600.0000 mg | Freq: Two times a day (BID) | ORAL | Status: DC
  Administered 2021-01-13 – 2021-01-22 (×18): 600 mg
  Filled 2021-01-13 (×23): qty 12

## 2021-01-13 MED ORDER — LACTATED RINGERS IV BOLUS
1000.0000 mL | Freq: Once | INTRAVENOUS | Status: AC
  Administered 2021-01-13: 1000 mL via INTRAVENOUS

## 2021-01-13 MED ORDER — ACETAMINOPHEN 650 MG RE SUPP
325.0000 mg | RECTAL | Status: DC | PRN
  Administered 2021-01-13 – 2021-01-14 (×3): 325 mg via RECTAL
  Filled 2021-01-13 (×3): qty 1

## 2021-01-13 MED ORDER — GABAPENTIN 300 MG PO CAPS
600.0000 mg | ORAL_CAPSULE | Freq: Two times a day (BID) | ORAL | Status: DC
  Administered 2021-01-13: 600 mg via ORAL
  Filled 2021-01-13: qty 2

## 2021-01-13 MED ORDER — LORAZEPAM 2 MG/ML IJ SOLN
0.5000 mg | INTRAMUSCULAR | Status: AC | PRN
Start: 2021-01-13 — End: 2021-01-13
  Administered 2021-01-13: 0.5 mg via INTRAVENOUS
  Filled 2021-01-13: qty 1

## 2021-01-13 MED ORDER — FENTANYL CITRATE (PF) 100 MCG/2ML IJ SOLN
25.0000 ug | Freq: Once | INTRAMUSCULAR | Status: AC
Start: 2021-01-13 — End: 2021-01-13
  Administered 2021-01-13: 25 ug via INTRAVENOUS

## 2021-01-13 MED ORDER — INSULIN ASPART 100 UNIT/ML IJ SOLN
0.0000 [IU] | INTRAMUSCULAR | Status: DC
  Administered 2021-01-13 – 2021-01-14 (×5): 1 [IU] via SUBCUTANEOUS
  Administered 2021-01-15: 2 [IU] via SUBCUTANEOUS
  Administered 2021-01-15 – 2021-01-19 (×18): 1 [IU] via SUBCUTANEOUS
  Administered 2021-01-20: 2 [IU] via SUBCUTANEOUS
  Administered 2021-01-20 – 2021-01-22 (×14): 1 [IU] via SUBCUTANEOUS
  Administered 2021-01-22: 2 [IU] via SUBCUTANEOUS
  Administered 2021-01-22 – 2021-01-23 (×4): 1 [IU] via SUBCUTANEOUS

## 2021-01-13 MED ORDER — LORAZEPAM 2 MG/ML IJ SOLN
1.0000 mg | Freq: Once | INTRAMUSCULAR | Status: AC
  Administered 2021-01-13: 1 mg via INTRAVENOUS
  Filled 2021-01-13: qty 1

## 2021-01-13 MED ORDER — OXYCODONE-ACETAMINOPHEN 5-325 MG PO TABS
1.0000 | ORAL_TABLET | Freq: Four times a day (QID) | ORAL | Status: DC | PRN
  Filled 2021-01-13: qty 1

## 2021-01-13 MED ORDER — ACETAMINOPHEN 160 MG/5ML PO SOLN
650.0000 mg | Freq: Three times a day (TID) | ORAL | Status: DC | PRN
  Administered 2021-01-13: 650 mg via ORAL
  Filled 2021-01-13: qty 20.3

## 2021-01-13 MED ORDER — LIP MEDEX EX OINT
TOPICAL_OINTMENT | CUTANEOUS | Status: DC | PRN
  Filled 2021-01-13: qty 7

## 2021-01-13 NOTE — Progress Notes (Signed)
Rapid Response Event Note   Reason for Call : pt HR in 130's, minimal responsive   Initial Focused Assessment: Found pt not able to follow commands.  HR 120's.  Breath sounds decreased on 2 L [place] sats 96%.   Pt moaning and in distress showing signs of clear discomfort.  Oriented x 1 at times.  Condom cath has dark concentrated urine.  Pt has a rectal temp of 104.7.  Interventions: Paged TRH1, NP to bedside to evaluate pt immediately.  EKG and CBG complete.  Ice packs placed.  Pt has already received tylenol prior.  See chart for VS.    Plan of Care:  Pt to transfer to ICU/SD for closer monitoring and a higher level of care.  Pt needs multiple interventions and expert consults. Pt to transfer to 1224 as soon as possible.  Event Summary:  MD Notified: yes Call Time:1901 Arrival Time: 1905 End Time: 2005  Dyann Ruddle, RN

## 2021-01-13 NOTE — Progress Notes (Addendum)
NAME:  Joseph Banks, MRN:  [id number], DOB:  1946/06/29, LOS: 1 ADMISSION DATE:  01/12/2021, CONSULTATION DATE:  01/13/2021  REFERRING MD:  Dr. Olena Heckle, CHIEF COMPLAINT:  Sepsis    History of Present Illness:  This is a 75 year old gentleman, past medical history of diabetes, BPH, TURP 5 years ago.  Presents to emergency department with urinary hesitancy, dysuria.  He was treated for prostatitis with oral antibiotics.  Ultimately come back to the emergency department more disoriented.  Was found to have lactic acidosis, atrial fibrillation and concern for sepsis.  Patient was transferred from [facility], ER to [facility].  Initially admitted to the [facility] hospitalist service and ultimately transferred to the ICU this evening for progressive confusion and fever of greater than 104.  Patient has received 4 L of fluid and remains on IV antibiotics he has had been able to patient had CT scan completed at [facility] with masslike density within the bladder, possible clot.  Pertinent  Medical History   Past Medical History:  Diagnosis Date   BPH (benign prostatic hyperplasia)    Diabetes mellitus without complication ([place])      Significant Hospital Events: Including procedures, antibiotic start and stop dates in addition to other pertinent events   Patient reports her ICU admission  Interim History / Subjective:  Awake and alert x 4, CO generalized pain 5/10. 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  Temp 100.3, sats are 98% on 2 L>> remains on cooling blanket Remains in atrial fibrillation, rate controlled. Net + 4L, 700 cc UO last 24 Pt. Is awake and alert and oriented x 4, MAE x 3  Objective   Blood pressure (!) 105/46, pulse 96, temperature 100 F (37.8 C), temperature source Rectal, resp. rate (!) 24, height 6\' 3"  (1.905 m), weight 108.1 kg, SpO2 98 %.        Intake/Output Summary (Last 24 hours) at 01/13/2021 0818 Last data filed at  01/13/2021 0708 Gross per 24 hour  Intake 4994.51 ml  Output 950 ml  Net 4044.51 ml   Filed Weights   01/12/21 1700 01/12/21 1713 01/13/21 0347  Weight: 101.2 kg 101.2 kg 108.1 kg    Examination: General: Elderly male resting comfortably in bed awake, in NAD HENT: NCAT, tracking appropriately, No LAD, No JVD Lungs: Bilateral chest excursion, Clear to auscultation bilaterally no crackles no wheeze Cardiovascular: S1, S2, Irr, No RMG, Tachycardic per tele Abdomen: Soft, nontender non distended, BS +, Body mass index is 29.79 kg/m. Extremities: No significant edema, warm , brisk capillary refill Neuro: Awake , Alert oriented x 4, follows commands GU: Normal ,  no discharge, no groin erythema, Foley with blood tinged urine Resolved Hospital Problem list     Assessment & Plan:   Severe sepsis>> never required pressors Lactic acidosis>> still uptrending Concern for prostatitis, cystitis Acute urinary tract infection Bladder outlet obstruction History of TURP Blood tinged urine BPH Plan: Continue IV antibiotics, continue meropenem which was started earlier Patient has received adequate fluid resuscitation>> 4 L  Follow  culture results >>  de-escalate per sensitivities, urine and blood. Appreciate Urology Assistance Urology used scope at bedside to place Foley. Will check lactate to ensure it has cleared Trend WBC and Fever Curve Will consider additional fluids as lactate has not cleared.   Atrial fibrillation Plan A. fib rates are less than 120, hold off on any rate control at this time Will check BMET, goal for  K > 4 and Mag > 2 EKG prn Will check TSH in am Focus on treating patient's pain ( See below)  Acute pain Complaining of foot pain Plan Will resume gabapentin 600 mg BID once swallow has been completed Will add low dose oxy once he passes swallow  High Risk Aspiration Per wife, patient has had trouble swallowing for the last week Plan Swallow eval CXR  7/6/am Will add diet based on swallow recommendations   AKI Creatinine 1.26 on admission>> resolving  Plan Trend BMET Monitor UO  DM Plan CBG Q 4 SSI  Best Practice (right click and "Reselect all SmartList Selections" daily)   Diet/type: NPO DVT prophylaxis: PAS hose GI prophylaxis: N/A Lines: N/A Foley:  Yes, and it is still needed,  Code Status:  full code Last date of multidisciplinary goals of care discussion [n/a]  Wife and son updated at bedside 7/5 am . All questions answered  Labs   CBC: Recent Labs  Lab 01/12/21 1257 01/13/21 0245  WBC 6.0 7.7  NEUTROABS 4.8 4.8  HGB 11.8* 10.7*  HCT 33.8* 30.4*  MCV 91.4 88.9  PLT 49* 60*    Basic Metabolic Panel: Recent Labs  Lab 01/12/21 1257 01/12/21 1944  NA 132* 131*  K 4.3 4.2  CL 100 100  CO2 20* 21*  GLUCOSE 142* 141*  BUN 24* 22  CREATININE 1.26* 1.11  CALCIUM 8.2* 7.7*  MG  --  1.7   GFR: Estimated Creatinine Clearance: 77.5 mL/min (by C-G formula based on SCr of 1.11 mg/dL). Recent Labs  Lab 01/12/21 1257 01/12/21 1506 01/12/21 1807 01/13/21 0245  WBC 6.0  --   --  7.7  LATICACIDVEN 2.0* 3.2* 3.0*  --     Liver Function Tests: Recent Labs  Lab 01/12/21 1257 01/12/21 1944  AST 181* 150*  ALT 107* 86*  ALKPHOS 90 69  BILITOT 0.9 0.8  PROT 6.1* 5.2*  ALBUMIN 2.6* 2.1*   No results for input(s): LIPASE, AMYLASE in the last 168 hours. No results for input(s): AMMONIA in the last 168 hours.  ABG No results found for: PHART, PCO2ART, PO2ART, HCO3, TCO2, ACIDBASEDEF, O2SAT   Coagulation Profile: Recent Labs  Lab 01/12/21 1257 01/12/21 1807  INR 1.0 1.0    Cardiac Enzymes: No results for input(s): CKTOTAL, CKMB, CKMBINDEX, TROPONINI in the last 168 hours.  HbA1C: No results found for: HGBA1C  CBG: Recent Labs  Lab 01/12/21 1931 01/12/21 2044 01/12/21 2320 01/13/21 0340 01/13/21 0736  GLUCAP 129* 132* 155* 158* 136*    Allergies No Known Allergies   Home  Medications  Prior to Admission medications   Medication Sig Start Date End Date Taking? Authorizing Provider  gabapentin (NEURONTIN) 600 MG tablet Take 1 tablet (600 mg total) by mouth 2 (two) times daily. 12/22/20  Yes Cottle, Billey Co., MD  metFORMIN (GLUCOPHAGE) 500 MG tablet Take 500 mg by mouth 2 (two) times daily. 02/07/19   [provider]  Potassium 99 MG TABS Take by mouth. Patient not taking: Reported on 01/12/2021    [provider]  sulfamethoxazole-trimethoprim (BACTRIM DS) 800-160 MG tablet Take 1 tablet by mouth every 12 (twelve) hours. 12/25/20   Irine Seal, MD  tadalafil (CIALIS) 5 MG tablet Take 1 tablet (5 mg total) by mouth daily. 12/16/20   Franchot Gallo, MD  tamsulosin [facility]) 0.4 MG CAPS capsule Take 2 caps daily 12/16/20   Franchot Gallo, MD  traZODone (DESYREL) 50 MG tablet Take 1-2 tablets (50-100 mg total)  by mouth at bedtime as needed for sleep. 12/22/20   Cottle, Billey Co., MD     Critical care time:  This patient is critically ill with multiple organ system failure; which, requires frequent high complexity decision making, assessment, support, evaluation, and titration of therapies. This was completed through the application of advanced monitoring technologies and extensive interpretation of multiple databases. During this encounter critical care time was devoted to patient care services described in this note for 35 minutes.       Magdalen Spatz, AGACNP-BC [facility] 01/13/2021 8:18 AM    Critical care attending attestation note:  Patient seen and examined and relevant ancillary tests reviewed.  I agree with the assessment and plan of care as outlined by Eric Form, NP.   75 year old admitted with severe sepsis from presumed UTI/prostatitis and bladder abnormality on CT scan. Cystoscopy with clot, large extended prostate presumablly causing obstruction. No obvious mucosal lesions. Afib with RVR. On no pressors.    Synopsis of assessment and plan:  Septic Shock with elevated lactate: off pressors, urine/prostate source presumed. --broad spectrum abx per primary --appears dry, addl 1L LR this AM --Appreciate urology assistance  --repeat lactate   Afib w/ RVR: rates ok a time of evaluation. Sepsis, dehydration. --CTM --No rate/rhythm control agents at this time  PCCM will sign off.  CRITICAL CARE N/a  Lanier Clam, MD See Amion for contact info  01/13/2021, 10:49 AM

## 2021-01-13 NOTE — TOC Initial Note (Signed)
Transition of Care [facility]) - Initial/Assessment Note    Patient Details  Name: Joseph Banks MRN: [id number] Date of Birth: [birth year]  Transition of Care [facility]) CM/SW Contact:    Leeroy Cha, RN Phone Number: 01/13/2021, 7:36 AM  Clinical Narrative:                 75 y.o. male with history of diabetes mellitus who has been experiencing perianal pain and had been to Dr. Jeffie Pollock urologist on December 25, 2020 at that time was diagnosed with prostatitis and epididymitis was given 1 shot of ceftriaxone and was placed on Bactrim.  On January 08, 2021 patient went to the ER at [facility] with complaints of worsening pain and fever 104.  At the time patient had a CT scan and also was placed patient on ciprofloxacin which patient has been taking for the last 4 days despite which patient's pain has been worsening with the worsening fever according temperatures up to 104 degree.  Patient also has been increasing difficulty urinating with urine becoming more dark.   ED Course: In the ER patient is tachycardic and febrile with CT scan done today showing persistent mass in the bladder and enlarged prostate which I discussed with on-call urologist Dr. Junious Silk who wanted patient to be transferred to [facility] long hospital.  Blood cultures obtained and patient was placed on empiric antibiotics.  Labs show worsening of his platelet counts which was 128 on January 08, 2021 and it is around 68 today and patient's lactic acid 2 at admission is now 3.2 is getting further bolus of fluid and patient also has elevated LFTs when compared to one done 4 days ago.  Patient went to A. fib with RVR rate improved with Cardizem bolus 10 mg.  No previous history of A. fib.  COVID test is negative. TOC PLAN OF CARE:  Following for progression and hhc needs. Expected Discharge Plan: Home/Self Care     Patient Goals and CMS Choice        Expected Discharge Plan and Services Expected Discharge Plan: Home/Self Care   Discharge  Planning Services: CM Consult   Living arrangements for the past 2 months: Single Family Home                                      Prior Living Arrangements/Services Living arrangements for the past 2 months: Single Family Home Lives with:: Spouse Patient language and need for interpreter reviewed:: Yes Do you feel safe going back to the place where you live?: Yes            Criminal Activity/Legal Involvement Pertinent to Current Situation/Hospitalization: No - Comment as needed  Activities of Daily Living Home Assistive Devices/Equipment: None ADL Screening (condition at time of admission) Patient's cognitive ability adequate to safely complete daily activities?: Yes Is the patient deaf or have difficulty hearing?: No Does the patient have difficulty seeing, even when wearing glasses/contacts?: No Does the patient have difficulty concentrating, remembering, or making decisions?: No Patient able to express need for assistance with ADLs?: Yes Does the patient have difficulty dressing or bathing?: No Independently performs ADLs?: Yes (appropriate for developmental age) Does the patient have difficulty walking or climbing stairs?: No Weakness of Legs: None Weakness of Arms/Hands: None  Permission Sought/Granted                  Emotional Assessment Appearance::  Appears stated age Attitude/Demeanor/Rapport: Engaged Affect (typically observed): Calm Orientation: : Oriented to Place, Oriented to  Time, Oriented to Self, Oriented to Situation Alcohol / Substance Use: Not Applicable Psych Involvement: No (comment)  Admission diagnosis:  Acute cystitis with hematuria [N30.01] Sepsis ([place]) [A41.9] Patient Active Problem List   Diagnosis Date Noted   Sepsis ([place]) 01/12/2021   Acute lower UTI 01/12/2021   Diabetes mellitus type 2 in nonobese ([place]) 01/12/2021   Elevated LFTs 01/12/2021   ARF (acute renal failure) ([place]) 01/12/2021   GAD (generalized anxiety  disorder) 07/14/2018   Bipolar disorder ([place]) 07/14/2018   PCP:  Glenda Chroman, MD Pharmacy:   [facility] [address] [place] [address] Phone: [phone] Fax: [phone]     Social Determinants of Health (SDOH) Interventions    Readmission Risk Interventions No flowsheet data found.

## 2021-01-13 NOTE — Progress Notes (Signed)
Progress Note    [name]   ZGY:[id number]  DOB: [birth year]  DOA: 01/12/2021     1  PCP: Glenda Chroman, MD  CC: perianal pain  Hospital Course: Joseph Banks is a 75 y.o. male with history of diabetes mellitus who has been experiencing perianal pain and had been to Dr. Jeffie Pollock urologist on December 25, 2020 at that time was diagnosed with prostatitis and epididymitis was given 1 shot of ceftriaxone and was placed on Bactrim.  On January 08, 2021 patient went to the ER at [facility] with complaints of worsening pain and fever 104.  At the time patient had a CT scan and also was placed patient on ciprofloxacin which patient has been taking for the last 4 days despite which patient's pain has been worsening with the worsening fever according temperatures up to 104 degree.  Patient also has been increasing difficulty urinating with urine becoming more dark.   ED Course: In the ER patient is tachycardic and febrile with CT scan done today showing persistent mass in the bladder and enlarged prostate which I discussed with on-call urologist Dr. Junious Silk who wanted patient to be transferred to [facility] long hospital.  Blood cultures obtained and patient was placed on empiric antibiotics.  Labs show worsening of his platelet counts which was 128 on January 08, 2021 and it is around 32 today and patient's lactic acid 2 at admission is now 3.2 is getting further bolus of fluid and patient also has elevated LFTs when compared to one done 4 days ago.  Patient went to A. fib with RVR rate improved with Cardizem bolus 10 mg.  No previous history of A. fib.  COVID test is negative.  Interval History:  Wife present bedside this morning.  She states that he appears to be looking a little better in general Patient slightly a poor historian and could not/did not want to answer many questions regarding review of systems but seems to be denying any urinary symptoms.  ROS: Constitutional: negative for chills and fevers,  Respiratory: negative for cough, Cardiovascular: negative for chest pain, Gastrointestinal: negative for abdominal pain, and Genitourinary:negative for dysuria  Assessment & Plan:  Severe sepsis likely secondary to bacterial prostatitis  - s/p foley placement by urology on admission -Continue vancomycin and meropenem, follow cultures and de-escalate as able  A. fib with RVR - likely precipitated by sepsis - Heart rate improved with Cardizem bolus.  At this time since patient has possible need for procedure will avoid anticoagulation.  Check TSH and 2D echo  AKI - presumed from outlet obstruction; now s/p foley placed - BMP daily   BPH - continue flomax and finasteride per urology - continue foley   Thrombocytopenia - For now presumed due to acute illness - Trend CBC  Recent dysphagia - SLP eval: Dysphagia level 3 diet ordered  DMII - continue SSI and CBG monitoring   Old records reviewed in assessment of this patient  Antimicrobials: Vanc 7/4 >> current  Meropenem 7/4 >> current  DVT prophylaxis: SCDs Start: 01/12/21 1736   Code Status:   Code Status: Full Code Family Communication: wife  Disposition Plan: Status is: Inpatient  Remains inpatient appropriate because:IV treatments appropriate due to intensity of illness or inability to take PO and Inpatient level of care appropriate due to severity of illness  Dispo: The patient is from: Home              Anticipated d/c is to:  pending  PT eval              Patient currently is not medically stable to d/c.   Difficult to place patient No      Risk of unplanned readmission score: Unplanned Admission- Pilot do not use: 9.64   Objective: Blood pressure 105/69, pulse 95, temperature 98.1 F (36.7 C), temperature source Axillary, resp. rate (!) 24, height 6\' 3"  (1.905 m), weight 108.1 kg, SpO2 97 %.  Examination: General appearance:  Distracted but resting in bed in no distress Head: Normocephalic, without  obvious abnormality, atraumatic Eyes:  EOMI Lungs: clear to auscultation bilaterally Heart: regular rate and rhythm and S1, S2 normal Abdomen: normal findings: bowel sounds normal and soft, non-tender Extremities:  No edema Skin: mobility and turgor normal Neurologic: Grossly normal  Consultants:  Urology Pulmonary/critical care  Procedures:    Data Reviewed: I have personally reviewed following labs and imaging studies Results for orders placed or performed during the hospital encounter of 01/12/21 (from the past 24 hour(s))  Lactic acid, plasma     Status: Abnormal   Collection Time: 01/12/21  3:06 PM  Result Value Ref Range   Lactic Acid, Venous 3.2 (HH) 0.5 - 1.9 mmol/L  CBG monitoring, ED     Status: Abnormal   Collection Time: 01/12/21  6:01 PM  Result Value Ref Range   Glucose-Capillary 133 (H) 70 - 99 mg/dL  Lactic acid, plasma     Status: Abnormal   Collection Time: 01/12/21  6:07 PM  Result Value Ref Range   Lactic Acid, Venous 3.0 (HH) 0.5 - 1.9 mmol/L  Protime-INR     Status: None   Collection Time: 01/12/21  6:07 PM  Result Value Ref Range   Prothrombin Time 13.5 11.4 - 15.2 seconds   INR 1.0 0.8 - 1.2  APTT     Status: None   Collection Time: 01/12/21  6:07 PM  Result Value Ref Range   aPTT 31 24 - 36 seconds  MRSA Next Gen by PCR, Nasal     Status: None   Collection Time: 01/12/21  7:30 PM  Result Value Ref Range   MRSA by PCR Next Gen NOT DETECTED NOT DETECTED  Glucose, capillary     Status: Abnormal   Collection Time: 01/12/21  7:31 PM  Result Value Ref Range   Glucose-Capillary 129 (H) 70 - 99 mg/dL  Lactic acid, plasma     Status: Abnormal   Collection Time: 01/12/21  7:43 PM  Result Value Ref Range   Lactic Acid, Venous 2.3 (HH) 0.5 - 1.9 mmol/L  Magnesium     Status: None   Collection Time: 01/12/21  7:44 PM  Result Value Ref Range   Magnesium 1.7 1.7 - 2.4 mg/dL  Comprehensive metabolic panel     Status: Abnormal   Collection Time:  01/12/21  7:44 PM  Result Value Ref Range   Sodium 131 (L) 135 - 145 mmol/L   Potassium 4.2 3.5 - 5.1 mmol/L   Chloride 100 98 - 111 mmol/L   CO2 21 (L) 22 - 32 mmol/L   Glucose, Bld 141 (H) 70 - 99 mg/dL   BUN 22 8 - 23 mg/dL   Creatinine, Ser 1.11 0.61 - 1.24 mg/dL   Calcium 7.7 (L) 8.9 - 10.3 mg/dL   Total Protein 5.2 (L) 6.5 - 8.1 g/dL   Albumin 2.1 (L) 3.5 - 5.0 g/dL   AST 150 (H) 15 - 41 U/L   ALT 86 (H) 0 -  44 U/L   Alkaline Phosphatase 69 38 - 126 U/L   Total Bilirubin 0.8 0.3 - 1.2 mg/dL   GFR, Estimated >60 >60 mL/min   Anion gap 10 5 - 15  Glucose, capillary     Status: Abnormal   Collection Time: 01/12/21  8:44 PM  Result Value Ref Range   Glucose-Capillary 132 (H) 70 - 99 mg/dL  Glucose, capillary     Status: Abnormal   Collection Time: 01/12/21 11:20 PM  Result Value Ref Range   Glucose-Capillary 155 (H) 70 - 99 mg/dL  CBC with Differential     Status: Abnormal   Collection Time: 01/13/21  2:45 AM  Result Value Ref Range   WBC 7.7 4.0 - 10.5 K/uL   RBC 3.42 (L) 4.22 - 5.81 MIL/uL   Hemoglobin 10.7 (L) 13.0 - 17.0 g/dL   HCT 30.4 (L) 39.0 - 52.0 %   MCV 88.9 80.0 - 100.0 fL   MCH 31.3 26.0 - 34.0 pg   MCHC 35.2 30.0 - 36.0 g/dL   RDW 14.6 11.5 - 15.5 %   Platelets 60 (L) 150 - 400 K/uL   nRBC 0.0 0.0 - 0.2 %   Neutrophils Relative % 62 %   Neutro Abs 4.8 1.7 - 7.7 K/uL   Lymphocytes Relative 32 %   Lymphs Abs 2.5 0.7 - 4.0 K/uL   Monocytes Relative 4 %   Monocytes Absolute 0.3 0.1 - 1.0 K/uL   Eosinophils Relative 0 %   Eosinophils Absolute 0.0 0.0 - 0.5 K/uL   Basophils Relative 1 %   Basophils Absolute 0.0 0.0 - 0.1 K/uL   Immature Granulocytes 1 %   Abs Immature Granulocytes 0.05 0.00 - 0.07 K/uL  Comprehensive metabolic panel     Status: Abnormal   Collection Time: 01/13/21  2:45 AM  Result Value Ref Range   Sodium 133 (L) 135 - 145 mmol/L   Potassium 4.3 3.5 - 5.1 mmol/L   Chloride 103 98 - 111 mmol/L   CO2 20 (L) 22 - 32 mmol/L   Glucose,  Bld 150 (H) 70 - 99 mg/dL   BUN 22 8 - 23 mg/dL   Creatinine, Ser 1.08 0.61 - 1.24 mg/dL   Calcium 7.8 (L) 8.9 - 10.3 mg/dL   Total Protein 5.2 (L) 6.5 - 8.1 g/dL   Albumin 2.0 (L) 3.5 - 5.0 g/dL   AST 156 (H) 15 - 41 U/L   ALT 87 (H) 0 - 44 U/L   Alkaline Phosphatase 66 38 - 126 U/L   Total Bilirubin 1.0 0.3 - 1.2 mg/dL   GFR, Estimated >60 >60 mL/min   Anion gap 10 5 - 15  Glucose, capillary     Status: Abnormal   Collection Time: 01/13/21  3:40 AM  Result Value Ref Range   Glucose-Capillary 158 (H) 70 - 99 mg/dL  Glucose, capillary     Status: Abnormal   Collection Time: 01/13/21  7:36 AM  Result Value Ref Range   Glucose-Capillary 136 (H) 70 - 99 mg/dL   Comment 1 Notify RN    Comment 2 Document in Chart   Basic metabolic panel     Status: Abnormal   Collection Time: 01/13/21  8:49 AM  Result Value Ref Range   Sodium 135 135 - 145 mmol/L   Potassium 3.8 3.5 - 5.1 mmol/L   Chloride 104 98 - 111 mmol/L   CO2 21 (L) 22 - 32 mmol/L   Glucose, Bld 140 (H) 70 -  99 mg/dL   BUN 22 8 - 23 mg/dL   Creatinine, Ser 1.04 0.61 - 1.24 mg/dL   Calcium 8.0 (L) 8.9 - 10.3 mg/dL   GFR, Estimated >60 >60 mL/min   Anion gap 10 5 - 15  Magnesium     Status: None   Collection Time: 01/13/21  8:49 AM  Result Value Ref Range   Magnesium 1.8 1.7 - 2.4 mg/dL  Lactic acid, plasma     Status: Abnormal   Collection Time: 01/13/21  8:49 AM  Result Value Ref Range   Lactic Acid, Venous 2.5 (HH) 0.5 - 1.9 mmol/L  Glucose, capillary     Status: Abnormal   Collection Time: 01/13/21 11:27 AM  Result Value Ref Range   Glucose-Capillary 129 (H) 70 - 99 mg/dL   Comment 1 Notify RN    Comment 2 Document in Chart     Recent Results (from the past 240 hour(s))  Blood culture (routine single)     Status: None (Preliminary result)   Collection Time: 01/12/21 12:57 PM   Specimen: Right Antecubital; Blood  Result Value Ref Range Status   Specimen Description   Final    RIGHT ANTECUBITAL BOTTLES DRAWN  AEROBIC AND ANAEROBIC   Special Requests   Final    Blood Culture adequate volume Performed at [facility], [address], [place], [place] [address]    Culture PENDING  Incomplete   Report Status PENDING  Incomplete  Resp Panel by RT-PCR (Flu A&B, Covid) Nasopharyngeal Swab     Status: None   Collection Time: 01/12/21  1:09 PM   Specimen: Nasopharyngeal Swab; Nasopharyngeal(NP) swabs in vial transport medium  Result Value Ref Range Status   SARS Coronavirus 2 by RT PCR NEGATIVE NEGATIVE Final    Comment: (NOTE) SARS-CoV-2 target nucleic acids are NOT DETECTED.  The SARS-CoV-2 RNA is generally detectable in upper respiratory specimens during the acute phase of infection. The lowest concentration of SARS-CoV-2 viral copies this assay can detect is 138 copies/mL. A negative result does not preclude SARS-Cov-2 infection and should not be used as the sole basis for treatment or other patient management decisions. A negative result may occur with  improper specimen collection/handling, submission of specimen other than nasopharyngeal swab, presence of viral mutation(s) within the areas targeted by this assay, and inadequate number of viral copies(<138 copies/mL). A negative result must be combined with clinical observations, patient history, and epidemiological information. The expected result is Negative.  Fact Sheet for Patients:  EntrepreneurPulse.com.au  Fact Sheet for Healthcare Providers:  IncredibleEmployment.be  This test is no t yet approved or cleared by the [place] FDA and  has been authorized for detection and/or diagnosis of SARS-CoV-2 by FDA under an Emergency Use Authorization (EUA). This EUA will remain  in effect (meaning this test can be used) for the duration of the COVID-19 declaration under Section 564(b)(1) of the Act, 21 U.S.C.section 360bbb-3(b)(1), unless the authorization is terminated  or revoked sooner.        Influenza A by PCR NEGATIVE NEGATIVE Final   Influenza B by PCR NEGATIVE NEGATIVE Final    Comment: (NOTE) The Xpert Xpress SARS-CoV-2/FLU/RSV plus assay is intended as an aid in the diagnosis of influenza from Nasopharyngeal swab specimens and should not be used as a sole basis for treatment. Nasal washings and aspirates are unacceptable for Xpert Xpress SARS-CoV-2/FLU/RSV testing.  Fact Sheet for Patients: EntrepreneurPulse.com.au  Fact Sheet for Healthcare Providers: IncredibleEmployment.be  This test is not yet approved  or cleared by the [place] and has been authorized for detection and/or diagnosis of SARS-CoV-2 by FDA under an Emergency Use Authorization (EUA). This EUA will remain in effect (meaning this test can be used) for the duration of the COVID-19 declaration under Section 564(b)(1) of the Act, 21 U.S.C. section 360bbb-3(b)(1), unless the authorization is terminated or revoked.  Performed at [facility], [address], [place], [place] [address]   MRSA Next Gen by PCR, Nasal     Status: None   Collection Time: 01/12/21  7:30 PM  Result Value Ref Range Status   MRSA by PCR Next Gen NOT DETECTED NOT DETECTED Final    Comment: (NOTE) The GeneXpert MRSA Assay (FDA approved for NASAL specimens only), is one component of a comprehensive MRSA colonization surveillance program. It is not intended to diagnose MRSA infection nor to guide or monitor treatment for MRSA infections. Test performance is not FDA approved in patients less than 77 years old. Performed at [facility], [place] [address], [place], [place] [address]      Radiology Studies: CT ABDOMEN PELVIS W CONTRAST  Result Date: 01/12/2021 CLINICAL DATA:  Abdominal pain. EXAM: CT ABDOMEN AND PELVIS WITH CONTRAST TECHNIQUE: Multidetector CT imaging of the abdomen and pelvis was performed using the standard protocol following bolus administration of  intravenous contrast. CONTRAST:  172mL OMNIPAQUE IOHEXOL 300 MG/ML  SOLN COMPARISON:  01/08/2021 FINDINGS: Lower chest: Subsegmental atelectasis and dependent change noted in the right lung base. Hepatobiliary: No focal liver abnormality. The gallbladder is normal. No signs of bile duct dilatation Pancreas: Unremarkable. No pancreatic ductal dilatation or surrounding inflammatory changes. Spleen: Normal in size without focal abnormality. Adrenals/Urinary Tract: Normal adrenal glands. No kidney mass identified bilaterally. Bilateral pelviectasis is new from previous exam there is mild bilateral hydroureter. Mild bladder distension. Streak artifact from right hip arthroplasty device diminishes evaluation of the bladder. Similar to the previous exam is a heterogeneous, hyperdense mass within the bladder measuring 6.8 x 5.8 by 4.8 cm. Stomach/Bowel: Small hiatal hernia. Stomach is nondistended the appendix is not confidently identified no secondary signs of acute appendicitis no bowel wall thickening, inflammation, or distension. Vascular/Lymphatic: Aortic atherosclerosis. No retroperitoneal or mesenteric adenopathy multiple prominent, non pathologically enlarged pelvic lymph nodes are identified similar to previous exam for example, right external iliac node measures 0.9 cm, image 68/2. Borderline enlarged bilateral inguinal lymph nodes are identified. Index left inguinal lymph node measures 1.3 cm, image 98/2. Index right inguinal node measures 1.2 cm, image 98/2. Reproductive: Prostate gland appears enlarged with mass effect upon the bladder base measuring approximate 6.9 x 5.6 x 7.4 cm (volume = 150 cm^3). Other: No free fluid or fluid collections. 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 Similar appearance of heterogeneous, hyperdense mass within the bladder which may represent  a large blood clot or neoplasm. Evaluation the bladder is diminished secondary to beam hardening artifact from patient's right hip arthroplasty device. Bladder ultrasound may be helpful in the acute setting to assess the nature of this mass. 2. Bilateral pelviectasis and hydroureter. 3. Marked prostate gland enlargement with mass effect upon the bladder base. 4. 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 5. Aortic atherosclerosis. Aortic Atherosclerosis (ICD10-I70.0). Electronically Signed   By: Kerby Moors M.D.   On: 01/12/2021 15:55   DG Chest [facility]  Result Date: 01/12/2021 CLINICAL DATA:  Questionable sepsis. EXAM: PORTABLE CHEST 1 VIEW COMPARISON:  Abdominal CT 12/12/2020 FINDINGS: Portable views of the chest were obtained. Few densities at the left lung base and there were small densities in this area on the previous CT topogram. Otherwise, the lungs are clear. Negative for a pneumothorax. Heart and mediastinum are within normal limits. Trachea is midline. IMPRESSION: 1. No acute cardiopulmonary disease. 2. Left basilar densities are suggestive for atelectasis or scarring. Electronically Signed   By: Markus Daft M.D.   On: 01/12/2021 13:47   CT ABDOMEN PELVIS W CONTRAST  Final Result    DG Chest Port 1 View  Final Result    DG CHEST PORT 1 VIEW    (Results Pending)    Scheduled Meds:  Chlorhexidine Gluconate Cloth  6 each Topical Q0600   finasteride  5 mg Oral Daily   gabapentin  600 mg Oral BID   insulin aspart  0-6 Units Subcutaneous Q4H   PRN Meds: acetaminophen Continuous Infusions:  lactated ringers 125 mL/hr at 01/13/21 0813   meropenem (MERREM) IV Stopped (01/13/21 1245)   vancomycin 10 mL/hr at 01/13/21 0800     LOS: 1 day  Time spent: Greater than 50% of the 35 minute visit was spent in counseling/coordination of care for the patient as laid out in the A&P.   Dwyane Dee, MD [facility] 01/13/2021, 1:29 PM

## 2021-01-13 NOTE — Progress Notes (Signed)
    OVERNIGHT EVENT PROGRESS REPORT  Joseph Banks is a 75 year old male with a history of elevated PSA and BPH (Dr. Diona Fanti)  His wife states that he was seen earlier in June with worsening lower urinary tract symptoms and some degree of what appears to have been increasing confusion since that time.  UA was clear at that time. On 6/16 he was seen in office by Dr. Jeffie Pollock with worsening symptoms    He had a temperature of 100 at that time.  Urine culture was positive for E. coli (Rocephin and Bactrim for 2 weeks).    He then went to [facility] ER (6/30)    He was put on ciprofloxacin.  He was discharged with a possible clot/mass.  Patient has a long history of BPH, TURP (2013). Elevated PSA of 9-11  3 prior biopsies    He arrived at [place] on the July 4th and was transferred to [facility] ER. He was then admitted to the floor and a Rapid Response was called by the RN for Fever >104, Tachycardia, restlessness and intermittent uncontrolled urination/confusion   At the time of my arrival to the floor during the Rapid response he was in obvious distress, moaning, incoherent, HYPERthermic and intermittently urinating dark cloudy reddish brown urine.  He had received 4L of IV fluid/Tylenol and Ice packs were in use at the time of the RR call, and his Blood Pressures were in the 130s SBP. He was A&O x 1 intermittently. He was transferred to ICU. His temp was 105 on arrival to ICU  He was put on a cooling blanket.  [facility] is aware.  CCM and Urology were consulted with transfer to ICU. Dr. Valeta Harms was consulted due to rapid increase in severity and suspected cause of decline and is en-route.  Dr. Junious Silk has been notified and is en-route due to his history with intent to place a catheter by scope and possible irrigation.   Family is aware and is with patient at this time.  Gershon Cull MSN MSNA ACNPC-AG Acute Care Nurse Practitioner [place]

## 2021-01-13 NOTE — Progress Notes (Addendum)
Brief Progress Note  Wife shared that patient has been seeing Dr. Charlott Holler ( Psych) for bipolar depression and anxiety.  She said he stopped his medications ( against MD advice) about 6 months ago. She has noticed some changes in behavior even prior to this current illness.  She will see if Dr. Charlott Holler sees patient's while they are inpatient. I have told her if he does not we can get a psych consult, but that we may need access to his records. She will try to get in touch with Dr. Charlott Holler to see if he is able to see the patient vs getting an inpatient psych consult to resume the appropriate medications.   In the interim, we will place a panda feeding tube for medications and nutrition. Dietitian has been consulted for tube feeding recommendations  Pt refused feeding tube, will attempt again in am   Additional 10 minutes APP time  Magdalen Spatz, MSN, AGACNP-BC [place] for personal pager PCCM on call pager [phone]  01/13/2021 4:02 PM

## 2021-01-13 NOTE — Evaluation (Signed)
Clinical/Bedside Swallow Evaluation Patient Details  Name: ARIC JOST MRN: [id number] Date of Birth: 04/11/1946  Today's Date: 01/13/2021 Time: SLP Start Time (ACUTE ONLY): 69 SLP Stop Time (ACUTE ONLY): 53 SLP Time Calculation (min) (ACUTE ONLY): 32 min  Past Medical History:  Past Medical History:  Diagnosis Date   BPH (benign prostatic hyperplasia)    Diabetes mellitus without complication ([place])    Past Surgical History:  Past Surgical History:  Procedure Laterality Date   TRANSURETHRAL RESECTION OF PROSTATE     HPI:  Per MD note "75 yo male past medical history of diabetes, BPH, TURP 5 years ago.  Presents to emergency department with urinary hesitancy, dysuria.  He was treated for prostatitis with oral antibiotics.  Ultimately come back to the emergency department more disoriented.  Was found to have lactic acidosis, atrial fibrillation and concern for sepsis.  Patient was transferred from [facility], ER to [facility].  Initially admitted to the [facility] service and ultimately transferred to the ICU this evening for progressive confusion and fever of greater than 104.  Patient has received 4 L of fluid and remains on IV antibiotics he has had been able to patient had CT scan completed at [facility] with masslike density within the bladder, possible clot."  SLP swallow evaluation ordered as pt was not swallowing.  Pt with h/o COVID per RN, DM and BPH.   Assessment / Plan / Recommendation Clinical Impression  Patient presents with cognitive based dysphagia but no indication of neuromuscular deficits and no focal CN deficits noted.  Marland Kitchen  Upon initial intake of ice chips and sprite - pt winced after timely swallow - stating it "tasted bad". SLP located items that were more appealing to him including jello, graham cracker, water and Sprite.  He held his own cup and graham cracker to feed himself *hand over hand assist with cup.    Initially he was not following SLPs instructions,  however after oral care and minimal intake, his participation improved.  Also question gustatory impacts - as pt states everything "tastes bad".   He keeps his eyes closed during  most of the session.  Due to pt's mentation, recommend dys3/thin diet - helping him eat "hand over hand" as needed when fully alert.     Anticipate his swallowing ability will improve as pt medically recovers. Will follow briefly to assure tolerance, modify diet, conduct instrumental evaluation if needed, etc.   Recommend try medications with thin water/liquids - having pt start with drink of water first to assure oral cavity to moist to aid oral propulsion. SLP Visit Diagnosis: Dysphagia, unspecified (R13.10)    Aspiration Risk  Mild aspiration risk    Diet Recommendation Dysphagia 3 (Mech soft);Thin liquid   Liquid Administration via: Cup;Straw Medication Administration: Whole meds with liquid Supervision: Staff to assist with self feeding Compensations: Slow rate;Small sips/bites;Other (Comment) (have pt help self feed as able) Postural Changes: Seated upright at 90 degrees;Remain upright for at least 30 minutes after po intake    Other  Recommendations Oral Care Recommendations: Oral care BID Other Recommendations: Have oral suction available   Follow up Recommendations None      Frequency and Duration min 1 x/week  1 week       Prognosis Prognosis for Safe Diet Advancement: Fair      Swallow Study   General Date of Onset: 01/13/21 HPI: Per MD note "75 yo male past medical history of diabetes, BPH, TURP 5 years ago.  Presents  to emergency department with urinary hesitancy, dysuria.  He was treated for prostatitis with oral antibiotics.  Ultimately come back to the emergency department more disoriented.  Was found to have lactic acidosis, atrial fibrillation and concern for sepsis.  Patient was transferred from [facility], ER to [facility].  Initially admitted to the [facility] and  ultimately transferred to the ICU this evening for progressive confusion and fever of greater than 104.  Patient has received 4 L of fluid and remains on IV antibiotics he has had been able to patient had CT scan completed at [facility] with masslike density within the bladder, possible clot."  SLP swallow evaluation ordered as pt was not swallowing.  Pt with h/o COVID per RN, DM and BPH. Type of Study: Bedside Swallow Evaluation Behavior/Cognition: Doesn't follow directions;Distractible Oral Cavity Assessment: Within Functional Limits Oral Care Completed by SLP: Other (Comment) (with oral suction) Oral Cavity - Dentition: Adequate natural dentition Vision: Functional for self-feeding Self-Feeding Abilities: Able to feed self Patient Positioning: Upright in bed Baseline Vocal Quality: Normal Volitional Cough: Strong Volitional Swallow: Able to elicit    Oral/Motor/Sensory Function Overall Oral Motor/Sensory Function: Within functional limits   Ice Chips Ice chips: Within functional limits Presentation: Self Fed;Spoon   Thin Liquid Thin Liquid: Within functional limits Presentation: Spoon;Straw;Cup;Self Fed    Nectar Thick Nectar Thick Liquid: Not tested   Honey Thick Honey Thick Liquid: Not tested   Puree Puree: Within functional limits Presentation: Self Fed;Spoon   Solid     Solid: Impaired Presentation: Self Fed Oral Phase Functional Implications: Prolonged oral transit      Macario Golds 01/13/2021,1:10 PM Kathleen Lime, MS [facility] SLP Acute Rehab Services Office [phone] Pager [phone]

## 2021-01-13 NOTE — Progress Notes (Signed)
Patient complaining of foot pain this morning.  Vitals:   01/13/21 0900 01/13/21 1100  BP: 105/69   Pulse: 95   Resp: (!) 24   Temp: 99.6 F (37.6 C) (!) 101.1 F (38.4 C)  SpO2: 97%     Intake/Output Summary (Last 24 hours) at 01/13/2021 1117 Last data filed at 01/13/2021 0800 Gross per 24 hour  Intake 5525.32 ml  Output 950 ml  Net 4575.32 ml   Physical exam:  Patient now calm, more lucid.   Abdomen-soft and nontender  GU-Foley in place, urine dark but clear.  CBC    Component Value Date/Time   WBC 7.7 01/13/2021 0245   RBC 3.42 (L) 01/13/2021 0245   HGB 10.7 (L) 01/13/2021 0245   HCT 30.4 (L) 01/13/2021 0245   PLT 60 (L) 01/13/2021 0245   MCV 88.9 01/13/2021 0245   MCH 31.3 01/13/2021 0245   MCHC 35.2 01/13/2021 0245   RDW 14.6 01/13/2021 0245   LYMPHSABS 2.5 01/13/2021 0245   MONOABS 0.3 01/13/2021 0245   EOSABS 0.0 01/13/2021 0245   BASOSABS 0.0 01/13/2021 0245   Lab Results  Component Value Date   CREATININE 1.04 01/13/2021   CREATININE 1.08 01/13/2021   CREATININE 1.11 01/12/2021   Assessment/plan:  Sepsis-presumably urinary in source.  Fever curve is improving.   BPH-continue tamsulosin and finasteride.  Continue Foley catheter.   Gross hematuria-this may be related to infection and that I believe the CT findings were a blood clot.  Consider repeat outpatient cystoscopy when stable.   I spoke with Dr. Diona Fanti and updated him.  He is at one of our out-of-town clinics today and will check on the patient tomorrow.

## 2021-01-14 ENCOUNTER — Inpatient Hospital Stay (HOSPITAL_COMMUNITY): Payer: Medicare HMO

## 2021-01-14 DIAGNOSIS — A419 Sepsis, unspecified organism: Secondary | ICD-10-CM | POA: Diagnosis not present

## 2021-01-14 LAB — CBC WITH DIFFERENTIAL/PLATELET
Abs Immature Granulocytes: 0.06 10*3/uL (ref 0.00–0.07)
Basophils Absolute: 0 10*3/uL (ref 0.0–0.1)
Basophils Relative: 1 %
Eosinophils Absolute: 0.2 10*3/uL (ref 0.0–0.5)
Eosinophils Relative: 3 %
HCT: 33.2 % — ABNORMAL LOW (ref 39.0–52.0)
Hemoglobin: 11.2 g/dL — ABNORMAL LOW (ref 13.0–17.0)
Immature Granulocytes: 1 %
Lymphocytes Relative: 32 %
Lymphs Abs: 2.3 10*3/uL (ref 0.7–4.0)
MCH: 31.3 pg (ref 26.0–34.0)
MCHC: 33.7 g/dL (ref 30.0–36.0)
MCV: 92.7 fL (ref 80.0–100.0)
Monocytes Absolute: 0.5 10*3/uL (ref 0.1–1.0)
Monocytes Relative: 7 %
Neutro Abs: 4.1 10*3/uL (ref 1.7–7.7)
Neutrophils Relative %: 56 %
Platelets: 71 10*3/uL — ABNORMAL LOW (ref 150–400)
RBC: 3.58 MIL/uL — ABNORMAL LOW (ref 4.22–5.81)
RDW: 14.7 % (ref 11.5–15.5)
WBC: 7.1 10*3/uL (ref 4.0–10.5)
nRBC: 0 % (ref 0.0–0.2)

## 2021-01-14 LAB — BASIC METABOLIC PANEL
Anion gap: 9 (ref 5–15)
BUN: 23 mg/dL (ref 8–23)
CO2: 22 mmol/L (ref 22–32)
Calcium: 7.8 mg/dL — ABNORMAL LOW (ref 8.9–10.3)
Chloride: 103 mmol/L (ref 98–111)
Creatinine, Ser: 1.05 mg/dL (ref 0.61–1.24)
GFR, Estimated: >60 mL/min (ref >60)
Glucose, Bld: 131 mg/dL — ABNORMAL HIGH (ref 70–99)
Potassium: 4 mmol/L (ref 3.5–5.1)
Sodium: 134 mmol/L — ABNORMAL LOW (ref 135–145)

## 2021-01-14 LAB — GLUCOSE, CAPILLARY
Glucose-Capillary: 115 mg/dL — ABNORMAL HIGH (ref 70–99)
Glucose-Capillary: 125 mg/dL — ABNORMAL HIGH (ref 70–99)
Glucose-Capillary: 156 mg/dL — ABNORMAL HIGH (ref 70–99)
Glucose-Capillary: 165 mg/dL — ABNORMAL HIGH (ref 70–99)
Glucose-Capillary: 141 mg/dL — ABNORMAL HIGH (ref 70–99)
Glucose-Capillary: 189 mg/dL — ABNORMAL HIGH (ref 70–99)

## 2021-01-14 LAB — TSH: TSH: 1.043 u[IU]/mL (ref 0.350–4.500)

## 2021-01-14 LAB — MAGNESIUM: Magnesium: 1.8 mg/dL (ref 1.7–2.4)

## 2021-01-14 LAB — PHOSPHORUS
Phosphorus: 4.3 mg/dL (ref 2.5–4.6)
Phosphorus: 3.5 mg/dL (ref 2.5–4.6)

## 2021-01-14 MED ORDER — MORPHINE SULFATE (PF) 2 MG/ML IV SOLN
1.0000 mg | Freq: Once | INTRAVENOUS | Status: AC
  Administered 2021-01-14: 1 mg via INTRAVENOUS
  Filled 2021-01-14: qty 1

## 2021-01-14 MED ORDER — MORPHINE SULFATE (PF) 2 MG/ML IV SOLN
2.0000 mg | Freq: Once | INTRAVENOUS | Status: AC
  Administered 2021-01-14: 2 mg via INTRAVENOUS
  Filled 2021-01-14: qty 1

## 2021-01-14 MED ORDER — POLYVINYL ALCOHOL 1.4 % OP SOLN
2.0000 [drp] | OPHTHALMIC | Status: DC | PRN
  Filled 2021-01-14: qty 15

## 2021-01-14 MED ORDER — FREE WATER
100.0000 mL | Status: DC
  Administered 2021-01-14 – 2021-01-23 (×48): 100 mL

## 2021-01-14 MED ORDER — GLUCERNA 1.5 CAL PO LIQD
1000.0000 mL | ORAL | Status: DC
  Administered 2021-01-14 – 2021-01-18 (×4): 1000 mL
  Filled 2021-01-14 (×11): qty 1000

## 2021-01-14 MED ORDER — ACETAMINOPHEN 160 MG/5ML PO SOLN
650.0000 mg | Freq: Three times a day (TID) | ORAL | Status: DC | PRN
  Administered 2021-01-14 – 2021-01-17 (×5): 650 mg
  Filled 2021-01-14 (×7): qty 20.3

## 2021-01-14 MED ORDER — MORPHINE SULFATE (PF) 2 MG/ML IV SOLN
2.0000 mg | INTRAVENOUS | Status: DC | PRN
  Filled 2021-01-14: qty 1

## 2021-01-14 MED ORDER — VITAL HIGH PROTEIN PO LIQD: 1000.0000 mL | ORAL | Status: DC

## 2021-01-14 MED ORDER — IBUPROFEN 100 MG/5ML PO SUSP
400.0000 mg | ORAL | Status: DC | PRN
  Administered 2021-01-14 – 2021-01-15 (×4): 400 mg
  Filled 2021-01-14 (×7): qty 20

## 2021-01-14 MED ORDER — MORPHINE SULFATE (PF) 2 MG/ML IV SOLN
2.0000 mg | INTRAVENOUS | Status: DC | PRN
  Administered 2021-01-14 – 2021-01-22 (×39): 2 mg via INTRAVENOUS
  Filled 2021-01-14 (×39): qty 1

## 2021-01-14 NOTE — Progress Notes (Signed)
Subjective: Patient reports no specific pain.  He is somnolent, but appropriate.  Objective: Vital signs in last 24 hours: Temp:  [98.1 F (36.7 C)-101.9 F (38.8 C)] 101.6 F (38.7 C) (07/06 0341) Pulse Rate:  [87-132] 129 (07/06 0700) Resp:  [19-35] 28 (07/06 0700) BP: (105-170)/(58-121) 147/121 (07/06 0700) SpO2:  [91 %-100 %] 94 % (07/06 0700) FiO2 (%):  [97 %] 97 % (07/05 0800) Weight:  [110.2 kg] 110.2 kg (07/06 0600)  Intake/Output from previous day: 07/05 0701 - 07/06 0700 In: 2167.5 [I.V.:1539.8; IV Piggyback:627.7] Out: 1250 [Urine:1250] Intake/Output this shift: No intake/output data recorded.  Physical Exam:  Constitutional: Vital signs reviewed.  He is shivering. Eyes: PERRL, No scleral icterus.   Cardiovascular: RRR Pulmonary/Chest: Normal effort Genitourinary: Catheter well-positioned.  His urine is light pink. Extremities: No cyanosis or edema   Lab Results: Recent Labs    01/12/21 1257 01/13/21 0245 01/14/21 0227  HGB 11.8* 10.7* 11.2*  HCT 33.8* 30.4* 33.2*   BMET Recent Labs    01/13/21 0849 01/14/21 0227  NA 135 134*  K 3.8 4.0  CL 104 103  CO2 21* 22  GLUCOSE 140* 131*  BUN 22 23  CREATININE 1.04 1.05  CALCIUM 8.0* 7.8*   Recent Labs    01/12/21 1257 01/12/21 1807  INR 1.0 1.0   No results for input(s): LABURIN in the last 72 hours. Results for orders placed or performed during the hospital encounter of 01/12/21  Blood culture (routine single)     Status: None (Preliminary result)   Collection Time: 01/12/21 12:57 PM   Specimen: Right Antecubital; Blood  Result Value Ref Range Status   Specimen Description   Final    RIGHT ANTECUBITAL BOTTLES DRAWN AEROBIC AND ANAEROBIC   Special Requests   Final    Blood Culture adequate volume Performed at [facility], [address] [place], [place] [address]    Culture PENDING  Incomplete   Report Status PENDING  Incomplete  Urine culture     Status: None (Preliminary result)    Collection Time: 01/12/21  1:08 PM   Specimen: In/Out Cath Urine  Result Value Ref Range Status   Specimen Description   Final    IN/OUT CATH URINE Performed at [facility], [address] [place], [place] [address]    Special Requests   Final    NONE Performed at [facility], [address], [place], [place] [address]    Culture   Final    CULTURE REINCUBATED FOR BETTER GROWTH Performed at [facility], [place] [address], [place],  [address]    Report Status PENDING  Incomplete  Resp Panel by RT-PCR (Flu A&B, Covid) Nasopharyngeal Swab     Status: None   Collection Time: 01/12/21  1:09 PM   Specimen: Nasopharyngeal Swab; Nasopharyngeal(NP) swabs in vial transport medium  Result Value Ref Range Status   SARS Coronavirus 2 by RT PCR NEGATIVE NEGATIVE Final    Comment: (NOTE) SARS-CoV-2 target nucleic acids are NOT DETECTED.  The SARS-CoV-2 RNA is generally detectable in upper respiratory specimens during the acute phase of infection. The lowest concentration of SARS-CoV-2 viral copies this assay can detect is 138 copies/mL. A negative result does not preclude SARS-Cov-2 infection and should not be used as the sole basis for treatment or other patient management decisions. A negative result may occur with  improper specimen collection/handling, submission of specimen other than nasopharyngeal swab, presence of viral mutation(s) within the areas targeted by this assay,  and inadequate number of viral copies(<138 copies/mL). A negative result must be combined with clinical observations, patient history, and epidemiological information. The expected result is Negative.  Fact Sheet for Patients:  EntrepreneurPulse.com.au  Fact Sheet for Healthcare Providers:  IncredibleEmployment.be  This test is no t yet approved or cleared by the [place] FDA and  has been authorized for detection and/or diagnosis of SARS-CoV-2 by FDA under an  Emergency Use Authorization (EUA). This EUA will remain  in effect (meaning this test can be used) for the duration of the COVID-19 declaration under Section 564(b)(1) of the Act, 21 U.S.C.section 360bbb-3(b)(1), unless the authorization is terminated  or revoked sooner.       Influenza A by PCR NEGATIVE NEGATIVE Final   Influenza B by PCR NEGATIVE NEGATIVE Final    Comment: (NOTE) The Xpert Xpress SARS-CoV-2/FLU/RSV plus assay is intended as an aid in the diagnosis of influenza from Nasopharyngeal swab specimens and should not be used as a sole basis for treatment. Nasal washings and aspirates are unacceptable for Xpert Xpress SARS-CoV-2/FLU/RSV testing.  Fact Sheet for Patients: EntrepreneurPulse.com.au  Fact Sheet for Healthcare Providers: IncredibleEmployment.be  This test is not yet approved or cleared by the [place] FDA and has been authorized for detection and/or diagnosis of SARS-CoV-2 by FDA under an Emergency Use Authorization (EUA). This EUA will remain in effect (meaning this test can be used) for the duration of the COVID-19 declaration under Section 564(b)(1) of the Act, 21 U.S.C. section 360bbb-3(b)(1), unless the authorization is terminated or revoked.  Performed at [facility], [address], [place], [place] [address]   MRSA Next Gen by PCR, Nasal     Status: None   Collection Time: 01/12/21  7:30 PM  Result Value Ref Range Status   MRSA by PCR Next Gen NOT DETECTED NOT DETECTED Final    Comment: (NOTE) The GeneXpert MRSA Assay (FDA approved for NASAL specimens only), is one component of a comprehensive MRSA colonization surveillance program. It is not intended to diagnose MRSA infection nor to guide or monitor treatment for MRSA infections. Test performance is not FDA approved in patients less than 1 years old. Performed at [facility], [place] [address], [place], [place] [address]      Studies/Results: DG Abd 1 View  Result Date: 01/13/2021 CLINICAL DATA:  NG tube placement EXAM: ABDOMEN - 1 VIEW COMPARISON:  CT 01/12/2021 FINDINGS: Esophageal tube tip overlies the mid to distal stomach. Gas pattern nonobstructed. Artifact over the abdomen and lower chest. IMPRESSION: Esophageal tube tip overlies the mid to distal stomach Electronically Signed   By: Donavan Foil M.D.   On: 01/13/2021 17:12   CT ABDOMEN PELVIS W CONTRAST  Result Date: 01/12/2021 CLINICAL DATA:  Abdominal pain. EXAM: CT ABDOMEN AND PELVIS WITH CONTRAST TECHNIQUE: Multidetector CT imaging of the abdomen and pelvis was performed using the standard protocol following bolus administration of intravenous contrast. CONTRAST:  180mL OMNIPAQUE IOHEXOL 300 MG/ML  SOLN COMPARISON:  01/08/2021 FINDINGS: Lower chest: Subsegmental atelectasis and dependent change noted in the right lung base. Hepatobiliary: No focal liver abnormality. The gallbladder is normal. No signs of bile duct dilatation Pancreas: Unremarkable. No pancreatic ductal dilatation or surrounding inflammatory changes. Spleen: Normal in size without focal abnormality. Adrenals/Urinary Tract: Normal adrenal glands. No kidney mass identified bilaterally. Bilateral pelviectasis is new from previous exam there is mild bilateral hydroureter. Mild bladder distension. Streak artifact from right hip arthroplasty device diminishes evaluation of the bladder. Similar to the previous  exam is a heterogeneous, hyperdense mass within the bladder measuring 6.8 x 5.8 by 4.8 cm. Stomach/Bowel: Small hiatal hernia. Stomach is nondistended the appendix is not confidently identified no secondary signs of acute appendicitis no bowel wall thickening, inflammation, or distension. Vascular/Lymphatic: Aortic atherosclerosis. No retroperitoneal or mesenteric adenopathy multiple prominent, non pathologically enlarged pelvic lymph nodes are identified similar to previous exam for example, right  external iliac node measures 0.9 cm, image 68/2. Borderline enlarged bilateral inguinal lymph nodes are identified. Index left inguinal lymph node measures 1.3 cm, image 98/2. Index right inguinal node measures 1.2 cm, image 98/2. Reproductive: Prostate gland appears enlarged with mass effect upon the bladder base measuring approximate 6.9 x 5.6 x 7.4 cm (volume = 150 cm^3). Other: No free fluid or fluid collections. 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 Similar appearance of heterogeneous, hyperdense mass within the bladder which may represent a large blood clot or neoplasm. Evaluation the bladder is diminished secondary to beam hardening artifact from patient's right hip arthroplasty device. Bladder ultrasound may be helpful in the acute setting to assess the nature of this mass. 2. Bilateral pelviectasis and hydroureter. 3. Marked prostate gland enlargement with mass effect upon the bladder base. 4. 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 5. Aortic atherosclerosis. Aortic Atherosclerosis (ICD10-I70.0). Electronically Signed   By: Kerby Moors M.D.   On: 01/12/2021 15:55   DG CHEST PORT 1 VIEW  Result Date: 01/14/2021 CLINICAL DATA:  Fever.  Aspiration. EXAM: PORTABLE CHEST 1 VIEW COMPARISON:  01/12/2021. FINDINGS: Feeding tube noted with tip below hemidiaphragms. Cardiomegaly. No pulmonary venous congestion. Bibasilar pulmonary infiltrates/edema. Small left pleural effusion cannot be excluded. No pneumothorax. No acute bony abnormality identified. IMPRESSION: 1.  Feeding tube noted with tip below left hemidiaphragm. 2.  Cardiomegaly.  No pulmonary venous congestion. 3. Bibasilar pulmonary infiltrates/edema. Aspiration cannot be excluded. Small left pleural effusion cannot be excluded. Electronically Signed   By: Marcello Moores  Register   On:  01/14/2021 06:57   DG Chest Port 1 View  Result Date: 01/12/2021 CLINICAL DATA:  Questionable sepsis. EXAM: PORTABLE CHEST 1 VIEW COMPARISON:  Abdominal CT 12/12/2020 FINDINGS: Portable views of the chest were obtained. Few densities at the left lung base and there were small densities in this area on the previous CT topogram. Otherwise, the lungs are clear. Negative for a pneumothorax. Heart and mediastinum are within normal limits. Trachea is midline. IMPRESSION: 1. No acute cardiopulmonary disease. 2. Left basilar densities are suggestive for atelectasis or scarring. Electronically Signed   By: Markus Daft M.D.   On: 01/12/2021 13:47    Assessment/Plan:  UTI with sepsis, likely prostatic source.  He is on meropenem and vancomycin.  Urine and blood cultures pending  Gross hematuria, seemingly improving.  Continue Foley catheter for now.  History of prostatitis-question prostatic abscess.  Hard to define based on his CT scan.  I think eventual MRI of the prostate and right be able to evaluate this if the patient does not improve significantly.   LOS: 2 days   Jorja Loa 01/14/2021, 7:43 AM

## 2021-01-14 NOTE — Plan of Care (Signed)
  Problem: Education: Goal: Knowledge of General Education information will improve Description: Including pain rating scale, medication(s)/side effects and non-pharmacologic comfort measures Outcome: Not Progressing   Problem: Clinical Measurements: Goal: Cardiovascular complication will be avoided Outcome: Progressing   Problem: Nutrition: Goal: Adequate nutrition will be maintained Outcome: Not Progressing   Problem: Coping: Goal: Level of anxiety will decrease Outcome: Not Progressing   Problem: Pain Managment: Goal: General experience of comfort will improve Outcome: Not Progressing

## 2021-01-14 NOTE — Progress Notes (Signed)
Progress Note    Joseph Banks   LPF:[id number]  DOB: [birth year]  DOA: 01/12/2021     2  PCP: Glenda Chroman, MD  CC: perianal pain  Hospital Course: Joseph Banks is a 75 y.o. male with history of diabetes mellitus who has been experiencing perianal pain and had been to Dr. Jeffie Pollock urologist on December 25, 2020 at that time was diagnosed with prostatitis and epididymitis was given 1 shot of ceftriaxone and was placed on Bactrim.  On January 08, 2021 patient went to the ER at [facility] with complaints of worsening pain and fever 104.  At the time patient had a CT scan and also was placed patient on ciprofloxacin which patient has been taking for the last 4 days despite which patient's pain has been worsening with the worsening fever according temperatures up to 104 degree.  Patient also has been increasing difficulty urinating with urine becoming more dark.   ED Course: In the ER patient is tachycardic and febrile with CT scan done today showing persistent mass in the bladder and enlarged prostate which I discussed with on-call urologist Dr. Junious Silk who wanted patient to be transferred to [facility] long hospital.  Blood cultures obtained and patient was placed on empiric antibiotics.  Labs show worsening of his platelet counts which was 128 on January 08, 2021 and it is around 64 today and patient's lactic acid 2 at admission is now 3.2 is getting further bolus of fluid and patient also has elevated LFTs when compared to one done 4 days ago.  Patient went to A. fib with RVR rate improved with Cardizem bolus 10 mg.  No previous history of A. fib.  COVID test is negative.  Interval History:  Wife present bedside this morning.  Patient had ongoing delirium/confusions overnight.  Ativan was given which helped a little and this morning he was difficult to arouse some and would not interact.  ROS: Constitutional: negative for chills and fevers, Respiratory: negative for cough, Cardiovascular: negative for chest  pain, Gastrointestinal: negative for abdominal pain, and Genitourinary:negative for dysuria  Assessment & Plan:  Severe sepsis likely secondary to bacterial prostatitis  - s/p foley placement by urology on admission -Continue vancomycin and meropenem, follow cultures and de-escalate as able -Remains febrile.  Concern for source control.  May need further imaging, possibly MRI per urology.  For now continuing to cautiously monitor - Repeat blood culture again this morning due to persistent fevers - Morphine added to help with his rigors  A. fib with RVR - likely precipitated by sepsis - Heart rate improved with Cardizem bolus.  At this time since patient has possible need for procedure will avoid anticoagulation.  Check TSH (normal, 1.043)  AKI -resolved - presumed from outlet obstruction; now s/p foley placed -Renal function has normalized - BMP daily   BPH - continue flomax and finasteride per urology - continue foley   Thrombocytopenia - For now presumed due to acute illness - Trend CBC  Recent dysphagia - SLP eval: Dysphagia level 3 diet ordered  DMII - continue SSI and CBG monitoring   Old records reviewed in assessment of this patient  Antimicrobials: Vanc 7/4 >> current  Meropenem 7/4 >> current  DVT prophylaxis: SCDs Start: 01/12/21 1736   Code Status:   Code Status: Full Code Family Communication: wife  Disposition Plan: Status is: Inpatient  Remains inpatient appropriate because:IV treatments appropriate due to intensity of illness or inability to take PO and Inpatient level of  care appropriate due to severity of illness  Dispo: The patient is from: Home              Anticipated d/c is to:  pending PT eval              Patient currently is not medically stable to d/c.   Difficult to place patient No  Risk of unplanned readmission score: Unplanned Admission- Pilot do not use: 7.51   Objective: Blood pressure (!) 134/103, pulse 83, temperature 98.5 F  (36.9 C), temperature source Rectal, resp. rate (!) 22, height 6\' 3"  (1.905 m), weight 110.2 kg, SpO2 94 %.  Examination: General appearance:  Laying in bed moaning and will not follow commands Head: Normocephalic, without obvious abnormality, atraumatic Eyes:  Right eye pupil irregular which is chronic/baseline Lungs: clear to auscultation bilaterally Heart: regular rate and rhythm and S1, S2 normal Abdomen: normal findings: bowel sounds normal and soft, non-tender Extremities:  No edema Skin: mobility and turgor normal Neurologic: Unable to follow commands and unable to fully assess  Consultants:  Urology Pulmonary/critical care  Procedures:    Data Reviewed: I have personally reviewed following labs and imaging studies Results for orders placed or performed during the hospital encounter of 01/12/21 (from the past 24 hour(s))  Glucose, capillary     Status: Abnormal   Collection Time: 01/13/21  3:07 PM  Result Value Ref Range   Glucose-Capillary 127 (H) 70 - 99 mg/dL   Comment 1 Notify RN    Comment 2 Document in Chart   Lactic acid, plasma     Status: Abnormal   Collection Time: 01/13/21  3:52 PM  Result Value Ref Range   Lactic Acid, Venous 2.9 (HH) 0.5 - 1.9 mmol/L  Glucose, capillary     Status: Abnormal   Collection Time: 01/13/21  7:34 PM  Result Value Ref Range   Glucose-Capillary 178 (H) 70 - 99 mg/dL  Glucose, capillary     Status: Abnormal   Collection Time: 01/13/21 11:19 PM  Result Value Ref Range   Glucose-Capillary 133 (H) 70 - 99 mg/dL  CBC with Differential/Platelet     Status: Abnormal   Collection Time: 01/14/21  2:27 AM  Result Value Ref Range   WBC 7.1 4.0 - 10.5 K/uL   RBC 3.58 (L) 4.22 - 5.81 MIL/uL   Hemoglobin 11.2 (L) 13.0 - 17.0 g/dL   HCT 33.2 (L) 39.0 - 52.0 %   MCV 92.7 80.0 - 100.0 fL   MCH 31.3 26.0 - 34.0 pg   MCHC 33.7 30.0 - 36.0 g/dL   RDW 14.7 11.5 - 15.5 %   Platelets 71 (L) 150 - 400 K/uL   nRBC 0.0 0.0 - 0.2 %   Neutrophils  Relative % 56 %   Neutro Abs 4.1 1.7 - 7.7 K/uL   Lymphocytes Relative 32 %   Lymphs Abs 2.3 0.7 - 4.0 K/uL   Monocytes Relative 7 %   Monocytes Absolute 0.5 0.1 - 1.0 K/uL   Eosinophils Relative 3 %   Eosinophils Absolute 0.2 0.0 - 0.5 K/uL   Basophils Relative 1 %   Basophils Absolute 0.0 0.0 - 0.1 K/uL   Immature Granulocytes 1 %   Abs Immature Granulocytes 0.06 0.00 - 0.07 K/uL   Reactive, Benign Lymphocytes PRESENT   Magnesium     Status: None   Collection Time: 01/14/21  2:27 AM  Result Value Ref Range   Magnesium 1.8 1.7 - 2.4 mg/dL  Basic metabolic panel  Status: Abnormal   Collection Time: 01/14/21  2:27 AM  Result Value Ref Range   Sodium 134 (L) 135 - 145 mmol/L   Potassium 4.0 3.5 - 5.1 mmol/L   Chloride 103 98 - 111 mmol/L   CO2 22 22 - 32 mmol/L   Glucose, Bld 131 (H) 70 - 99 mg/dL   BUN 23 8 - 23 mg/dL   Creatinine, Ser 1.05 0.61 - 1.24 mg/dL   Calcium 7.8 (L) 8.9 - 10.3 mg/dL   GFR, Estimated >60 >60 mL/min   Anion gap 9 5 - 15  TSH     Status: None   Collection Time: 01/14/21  2:27 AM  Result Value Ref Range   TSH 1.043 0.350 - 4.500 uIU/mL  Glucose, capillary     Status: Abnormal   Collection Time: 01/14/21  3:37 AM  Result Value Ref Range   Glucose-Capillary 115 (H) 70 - 99 mg/dL  Glucose, capillary     Status: Abnormal   Collection Time: 01/14/21  7:47 AM  Result Value Ref Range   Glucose-Capillary 125 (H) 70 - 99 mg/dL   Comment 1 Notify RN    Comment 2 Document in Chart   Glucose, capillary     Status: Abnormal   Collection Time: 01/14/21 11:23 AM  Result Value Ref Range   Glucose-Capillary 156 (H) 70 - 99 mg/dL   Comment 1 Notify RN    Comment 2 Document in Chart     Recent Results (from the past 240 hour(s))  Blood culture (routine single)     Status: None (Preliminary result)   Collection Time: 01/12/21 12:57 PM   Specimen: Right Antecubital; Blood  Result Value Ref Range Status   Specimen Description   Final    RIGHT ANTECUBITAL  BOTTLES DRAWN AEROBIC AND ANAEROBIC   Special Requests Blood Culture adequate volume  Final   Culture   Final    NO GROWTH 2 DAYS Performed at [facility], [address], [place], [place] [phone]    Report Status PENDING  Incomplete  Urine culture     Status: Abnormal (Preliminary result)   Collection Time: 01/12/21  1:08 PM   Specimen: In/Out Cath Urine  Result Value Ref Range Status   Specimen Description   Final    IN/OUT CATH URINE Performed at [facility], [address] [place], [place] [address]    Special Requests   Final    NONE Performed at [facility], [address], [place], [place] [address]    Culture (A)  Final    1,000 COLONIES/mL STAPHYLOCOCCUS EPIDERMIDIS SUSCEPTIBILITIES TO FOLLOW Performed at [facility], [address], [place], [place] [address]    Report Status PENDING  Incomplete  Resp Panel by RT-PCR (Flu A&B, Covid) Nasopharyngeal Swab     Status: None   Collection Time: 01/12/21  1:09 PM   Specimen: Nasopharyngeal Swab; Nasopharyngeal(NP) swabs in vial transport medium  Result Value Ref Range Status   SARS Coronavirus 2 by RT PCR NEGATIVE NEGATIVE Final    Comment: (NOTE) SARS-CoV-2 target nucleic acids are NOT DETECTED.  The SARS-CoV-2 RNA is generally detectable in upper respiratory specimens during the acute phase of infection. The lowest concentration of SARS-CoV-2 viral copies this assay can detect is 138 copies/mL. A negative result does not preclude SARS-Cov-2 infection and should not be used as the sole basis for treatment or other patient management decisions. A negative result may occur with  improper specimen collection/handling, submission of specimen  other than nasopharyngeal swab, presence of viral mutation(s) within the areas targeted by this assay, and inadequate number of viral copies(<138 copies/mL). A negative result must be combined with clinical observations, patient history, and epidemiological information. The  expected result is Negative.  Fact Sheet for Patients:  EntrepreneurPulse.com.au  Fact Sheet for Healthcare Providers:  IncredibleEmployment.be  This test is no t yet approved or cleared by the [place] FDA and  has been authorized for detection and/or diagnosis of SARS-CoV-2 by FDA under an Emergency Use Authorization (EUA). This EUA will remain  in effect (meaning this test can be used) for the duration of the COVID-19 declaration under Section 564(b)(1) of the Act, 21 U.S.C.section 360bbb-3(b)(1), unless the authorization is terminated  or revoked sooner.       Influenza A by PCR NEGATIVE NEGATIVE Final   Influenza B by PCR NEGATIVE NEGATIVE Final    Comment: (NOTE) The Xpert Xpress SARS-CoV-2/FLU/RSV plus assay is intended as an aid in the diagnosis of influenza from Nasopharyngeal swab specimens and should not be used as a sole basis for treatment. Nasal washings and aspirates are unacceptable for Xpert Xpress SARS-CoV-2/FLU/RSV testing.  Fact Sheet for Patients: EntrepreneurPulse.com.au  Fact Sheet for Healthcare Providers: IncredibleEmployment.be  This test is not yet approved or cleared by the [place] FDA and has been authorized for detection and/or diagnosis of SARS-CoV-2 by FDA under an Emergency Use Authorization (EUA). This EUA will remain in effect (meaning this test can be used) for the duration of the COVID-19 declaration under Section 564(b)(1) of the Act, 21 U.S.C. section 360bbb-3(b)(1), unless the authorization is terminated or revoked.  Performed at [facility], [address], [place], [place] [address]   MRSA Next Gen by PCR, Nasal     Status: None   Collection Time: 01/12/21  7:30 PM  Result Value Ref Range Status   MRSA by PCR Next Gen NOT DETECTED NOT DETECTED Final    Comment: (NOTE) The GeneXpert MRSA Assay (FDA approved for NASAL specimens only), is one  component of a comprehensive MRSA colonization surveillance program. It is not intended to diagnose MRSA infection nor to guide or monitor treatment for MRSA infections. Test performance is not FDA approved in patients less than 59 years old. Performed at [facility], [place] [address] [place],  [address]      Radiology Studies: DG Abd 1 View  Result Date: 01/13/2021 CLINICAL DATA:  NG tube placement EXAM: ABDOMEN - 1 VIEW COMPARISON:  CT 01/12/2021 FINDINGS: Esophageal tube tip overlies the mid to distal stomach. Gas pattern nonobstructed. Artifact over the abdomen and lower chest. IMPRESSION: Esophageal tube tip overlies the mid to distal stomach Electronically Signed   By: Donavan Foil M.D.   On: 01/13/2021 17:12   CT ABDOMEN PELVIS W CONTRAST  Result Date: 01/12/2021 CLINICAL DATA:  Abdominal pain. EXAM: CT ABDOMEN AND PELVIS WITH CONTRAST TECHNIQUE: Multidetector CT imaging of the abdomen and pelvis was performed using the standard protocol following bolus administration of intravenous contrast. CONTRAST:  197mL OMNIPAQUE IOHEXOL 300 MG/ML  SOLN COMPARISON:  01/08/2021 FINDINGS: Lower chest: Subsegmental atelectasis and dependent change noted in the right lung base. Hepatobiliary: No focal liver abnormality. The gallbladder is normal. No signs of bile duct dilatation Pancreas: Unremarkable. No pancreatic ductal dilatation or surrounding inflammatory changes. Spleen: Normal in size without focal abnormality. Adrenals/Urinary Tract: Normal adrenal glands. No kidney mass identified bilaterally. Bilateral pelviectasis is new from previous exam there is mild bilateral hydroureter. Mild bladder  distension. Streak artifact from right hip arthroplasty device diminishes evaluation of the bladder. Similar to the previous exam is a heterogeneous, hyperdense mass within the bladder measuring 6.8 x 5.8 by 4.8 cm. Stomach/Bowel: Small hiatal hernia. Stomach is nondistended the appendix  is not confidently identified no secondary signs of acute appendicitis no bowel wall thickening, inflammation, or distension. Vascular/Lymphatic: Aortic atherosclerosis. No retroperitoneal or mesenteric adenopathy multiple prominent, non pathologically enlarged pelvic lymph nodes are identified similar to previous exam for example, right external iliac node measures 0.9 cm, image 68/2. Borderline enlarged bilateral inguinal lymph nodes are identified. Index left inguinal lymph node measures 1.3 cm, image 98/2. Index right inguinal node measures 1.2 cm, image 98/2. Reproductive: Prostate gland appears enlarged with mass effect upon the bladder base measuring approximate 6.9 x 5.6 x 7.4 cm (volume = 150 cm^3). Other: No free fluid or fluid collections. 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 Similar appearance of heterogeneous, hyperdense mass within the bladder which may represent a large blood clot or neoplasm. Evaluation the bladder is diminished secondary to beam hardening artifact from patient's right hip arthroplasty device. Bladder ultrasound may be helpful in the acute setting to assess the nature of this mass. 2. Bilateral pelviectasis and hydroureter. 3. Marked prostate gland enlargement with mass effect upon the bladder base. 4. 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 5. Aortic atherosclerosis. Aortic Atherosclerosis (ICD10-I70.0). Electronically Signed   By: Kerby Moors M.D.   On: 01/12/2021 15:55   DG CHEST PORT 1 VIEW  Result Date: 01/14/2021 CLINICAL DATA:  Fever.  Aspiration. EXAM: PORTABLE CHEST 1 VIEW COMPARISON:  01/12/2021. FINDINGS: Feeding tube noted with tip below hemidiaphragms. Cardiomegaly. No pulmonary venous congestion. Bibasilar pulmonary infiltrates/edema. Small left pleural effusion cannot be excluded. No  pneumothorax. No acute bony abnormality identified. IMPRESSION: 1.  Feeding tube noted with tip below left hemidiaphragm. 2.  Cardiomegaly.  No pulmonary venous congestion. 3. Bibasilar pulmonary infiltrates/edema. Aspiration cannot be excluded. Small left pleural effusion cannot be excluded. Electronically Signed   By: Marcello Moores  Register   On: 01/14/2021 06:57   DG Chest Port 1 View  Result Date: 01/12/2021 CLINICAL DATA:  Questionable sepsis. EXAM: PORTABLE CHEST 1 VIEW COMPARISON:  Abdominal CT 12/12/2020 FINDINGS: Portable views of the chest were obtained. Few densities at the left lung base and there were small densities in this area on the previous CT topogram. Otherwise, the lungs are clear. Negative for a pneumothorax. Heart and mediastinum are within normal limits. Trachea is midline. IMPRESSION: 1. No acute cardiopulmonary disease. 2. Left basilar densities are suggestive for atelectasis or scarring. Electronically Signed   By: Markus Daft M.D.   On: 01/12/2021 13:47   DG CHEST PORT 1 VIEW  Final Result    DG Abd 1 View  Final Result    CT ABDOMEN PELVIS W CONTRAST  Final Result    DG Chest Port 1 View  Final Result      Scheduled Meds:  Chlorhexidine Gluconate Cloth  6 each Topical Q0600   finasteride  5 mg Oral Daily   gabapentin  600 mg Per Tube BID   insulin aspart  0-6 Units Subcutaneous Q4H   PRN Meds: acetaminophen (TYLENOL) oral liquid 160 mg/5 mL, acetaminophen, ibuprofen, lip balm, morphine injection, polyvinyl alcohol Continuous Infusions:  lactated ringers 75 mL/hr at 01/14/21 1200  meropenem (MERREM) IV Stopped (01/14/21 0600)   vancomycin Stopped (01/14/21 0918)     LOS: 2 days  Time spent: Greater than 50% of the 35 minute visit was spent in counseling/coordination of care for the patient as laid out in the A&P.   Dwyane Dee, MD [facility] 01/14/2021, 12:56 PM

## 2021-01-14 NOTE — Progress Notes (Signed)
Initial Nutrition Assessment  DOCUMENTATION CODES:   Not applicable  INTERVENTION:  - will order TF regimen: Glucerna 1.5 @ 35 ml/hr to advance by 10 ml every 4 hours to reach goal rate of 65 ml/hr with 100 ml free water every 4 hours. - at goal rate, this regimen will provide 2340 kcal, 129 grams protein, and 1784 ml free water.  - diet advancement as medically feasible.    NUTRITION DIAGNOSIS:   Increased nutrient needs related to acute illness as evidenced by estimated needs.  GOAL:   Patient will meet greater than or equal to 90% of their needs  MONITOR:   TF tolerance, Diet advancement, Labs, Weight trends  REASON FOR ASSESSMENT:   Consult Enteral/tube feeding initiation and management  ASSESSMENT:   75 y.o. male with history of DM and BPH. He was experiencing perianal pain on 6/16 and was diagnosed with prostatitis and epididymitis. On 6/30 he went to the ED at [facility] with complaints of worsening pain and fever up to 104 degrees. He has been having increasing difficulty urinating with urine becoming increasingly dark.  Patient discussed in rounds this AM and is reported to be in and out of confusion. In rounds it was reported that family informed medical staff yesterday that patient has been off of his psych medications for 6 months (patient's decision).   He is sleeping at this time and daughter is at bedside. Daughter was able to provide information. She reports that patient has hx of DM and is mindful of sugar and carbohydrate intake. He is a good eater at baseline and eats 3 large meals/day. His wife (her mom) mainly cooks and they eat out very infrequently.   Since 6/30 appetite has been slowly decreasing daily. Patient did report to daughter this AM that he is feeling hungry. He has been NPO since admission and small bore NGT placed yesterday evening (gastric placement).   Daughter reports that patient has no chewing or swallowing difficulties at baseline but that  over the weekend he had difficulties swallowing pills which the family found to be worrisome.   Weight today is 243 lb and weight yesterday was 238 lb. PTA the most recently documented weight was on 08/14/19 when he weighed 239 lb. Non-pitting edema to BUE and mild pitting edema to BLE documented in the edema section of flow sheet.   Per notes: - severe sepsis 2/2 bacterial prostatitis - afib with RVR - AKI    Labs reviewed; CBGs: 115, 125, 156 mg/dl, Na: 134 mmol/l, Ca: 7.8 mg/dl. Medications reviewed; sliding scale novolog. IVF; LR @ 75 ml/hr.    NUTRITION - FOCUSED PHYSICAL EXAM:  Unable to complete at this time.   Diet Order:   Diet Order             Diet NPO time specified Except for: Sips with Meds  Diet effective now                   EDUCATION NEEDS:   No education needs have been identified at this time  Skin:  Skin Assessment: Reviewed RN Assessment  Last BM:  PTA/unknown  Height:   Ht Readings from Last 1 Encounters:  01/12/21 6\' 3"  (1.905 m)    Weight:   Wt Readings from Last 1 Encounters:  01/14/21 110.2 kg     Estimated Nutritional Needs:  Kcal:  2350-2600 kcal Protein:  120-135 grams Fluid:  >/= 2.5 L/day       Jarome Matin, MS,  RD, LDN, CNSC Inpatient Clinical Dietitian RD pager # available in [place]  After hours/weekend pager # available in [facility]

## 2021-01-15 ENCOUNTER — Inpatient Hospital Stay (HOSPITAL_COMMUNITY): Payer: Medicare HMO

## 2021-01-15 ENCOUNTER — Inpatient Hospital Stay: Payer: Self-pay

## 2021-01-15 DIAGNOSIS — I4891 Unspecified atrial fibrillation: Secondary | ICD-10-CM

## 2021-01-15 DIAGNOSIS — A419 Sepsis, unspecified organism: Secondary | ICD-10-CM | POA: Diagnosis not present

## 2021-01-15 DIAGNOSIS — N400 Enlarged prostate without lower urinary tract symptoms: Secondary | ICD-10-CM

## 2021-01-15 DIAGNOSIS — R6520 Severe sepsis without septic shock: Secondary | ICD-10-CM | POA: Diagnosis not present

## 2021-01-15 DIAGNOSIS — D696 Thrombocytopenia, unspecified: Secondary | ICD-10-CM

## 2021-01-15 DIAGNOSIS — R1310 Dysphagia, unspecified: Secondary | ICD-10-CM

## 2021-01-15 LAB — GLUCOSE, CAPILLARY
Glucose-Capillary: 129 mg/dL — ABNORMAL HIGH (ref 70–99)
Glucose-Capillary: 143 mg/dL — ABNORMAL HIGH (ref 70–99)
Glucose-Capillary: 148 mg/dL — ABNORMAL HIGH (ref 70–99)
Glucose-Capillary: 165 mg/dL — ABNORMAL HIGH (ref 70–99)
Glucose-Capillary: 194 mg/dL — ABNORMAL HIGH (ref 70–99)
Glucose-Capillary: 206 mg/dL — ABNORMAL HIGH (ref 70–99)

## 2021-01-15 LAB — URINE CULTURE: Culture: 1000 — AB

## 2021-01-15 LAB — CK: Total CK: 40 U/L — ABNORMAL LOW (ref 49–397)

## 2021-01-15 MED ORDER — LORAZEPAM 2 MG/ML IJ SOLN
1.0000 mg | INTRAMUSCULAR | Status: DC | PRN
  Administered 2021-01-15 – 2021-01-22 (×13): 1 mg via INTRAVENOUS
  Filled 2021-01-15 (×12): qty 1

## 2021-01-15 MED ORDER — SODIUM CHLORIDE 0.9% FLUSH: 10.0000 mL | INTRAVENOUS | Status: DC | PRN

## 2021-01-15 MED ORDER — SODIUM CHLORIDE 0.9% FLUSH
10.0000 mL | Freq: Two times a day (BID) | INTRAVENOUS | Status: DC
Start: 2021-01-15 — End: 2021-01-25
  Administered 2021-01-15 – 2021-01-20 (×10): 10 mL
  Administered 2021-01-21 (×3): 20 mL
  Administered 2021-01-22: 10 mL
  Administered 2021-01-22: 20 mL
  Administered 2021-01-23: 10 mL
  Administered 2021-01-23: 20 mL
  Administered 2021-01-24 (×2): 10 mL

## 2021-01-15 MED ORDER — SODIUM CHLORIDE 0.9 % IV SOLN
2.0000 g | Freq: Two times a day (BID) | INTRAVENOUS | Status: DC
  Administered 2021-01-15 – 2021-01-17 (×4): 2 g via INTRAVENOUS
  Filled 2021-01-15 (×2): qty 20
  Filled 2021-01-15: qty 2
  Filled 2021-01-15: qty 20

## 2021-01-15 MED ORDER — METOPROLOL TARTRATE 5 MG/5ML IV SOLN
5.0000 mg | INTRAVENOUS | Status: DC | PRN
  Administered 2021-01-15 – 2021-01-20 (×5): 5 mg via INTRAVENOUS
  Filled 2021-01-15 (×5): qty 5

## 2021-01-15 MED ORDER — IOHEXOL 300 MG/ML  SOLN
100.0000 mL | Freq: Once | INTRAMUSCULAR | Status: AC | PRN
  Administered 2021-01-15: 100 mL via INTRAVENOUS

## 2021-01-15 MED ORDER — SODIUM CHLORIDE 0.9 % IV SOLN
100.0000 mg | Freq: Two times a day (BID) | INTRAVENOUS | Status: AC
  Administered 2021-01-15 – 2021-01-21 (×14): 100 mg via INTRAVENOUS
  Filled 2021-01-15 (×14): qty 100

## 2021-01-15 MED ORDER — SODIUM CHLORIDE 0.9 % IV SOLN
2.0000 g | INTRAVENOUS | Status: DC
  Administered 2021-01-15 – 2021-01-17 (×11): 2 g via INTRAVENOUS
  Filled 2021-01-15 (×2): qty 2000
  Filled 2021-01-15: qty 2
  Filled 2021-01-15 (×2): qty 2000
  Filled 2021-01-15: qty 2
  Filled 2021-01-15 (×2): qty 2000
  Filled 2021-01-15: qty 2
  Filled 2021-01-15 (×4): qty 2000

## 2021-01-15 MED ORDER — IOHEXOL 9 MG/ML PO SOLN
ORAL | Status: AC
  Administered 2021-01-15: 500 mL via ORAL
  Filled 2021-01-15: qty 1000

## 2021-01-15 MED ORDER — SODIUM CHLORIDE 0.9 % IV SOLN
2.0000 g | Freq: Three times a day (TID) | INTRAVENOUS | Status: DC
  Administered 2021-01-15: 2 g via INTRAVENOUS
  Filled 2021-01-15 (×2): qty 2

## 2021-01-15 MED ORDER — IOHEXOL 9 MG/ML PO SOLN
500.0000 mL | ORAL | Status: AC
  Administered 2021-01-15: 500 mL via ORAL

## 2021-01-15 NOTE — Assessment & Plan Note (Addendum)
-   presumed from outlet obstruction; now s/p foley placed -Renal function has normalized - BMP daily - given UE edema and net pos ~ 8L (on 7/9) will start to diurese some with IV lasix. Noted he is also on acyclovir and NS currently - albumin also very low, will use IV alb while diuresing - large urine output since lasix/alb started (very good response); need to d/c lasix in the next 1-2 days

## 2021-01-15 NOTE — Progress Notes (Signed)
SLP Cancellation Note  Patient Details Name: Joseph Banks MRN: [id number] DOB: Nov 12, 1945   Cancelled treatment:       Reason Eval/Treat Not Completed: Other (comment);Fatigue/lethargy limiting ability to participate (pt remains lethargic and on tube feeding per RN)  Kathleen Lime, MS [place] Office [phone] Pager [phone]  Macario Golds 01/15/2021, 12:50 PM

## 2021-01-15 NOTE — Hospital Course (Addendum)
Joseph Banks is a 75 y.o. male with history of diabetes mellitus who has been experiencing perianal pain and had been to Dr. Jeffie Pollock urologist on 12/25/20; at that time was diagnosed with prostatitis and epididymitis was given 1 shot of ceftriaxone and was placed on Bactrim.  On 01/09/32 patient went to the ER at [facility] with complaints of worsening pain and fever 104.  At the time patient had a CT scan and also was placed on ciprofloxacin which he was on for 4 days PTA but having worsening fever.  He also began developing more difficulty urinating with darkening urine.   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  He was started on empiric antibiotics. Furthermore, he also developed A. fib with RVR which required initial rate control with Cardizem which was able to be transitioned to PRN lopressor.  He continued to remain encephalopathic with intermittent fevers.  Further work-up was commenced. See below for further problem based plan.

## 2021-01-15 NOTE — Assessment & Plan Note (Signed)
-   continue SSI and CBG monitoring  

## 2021-01-15 NOTE — Assessment & Plan Note (Addendum)
-   continue flomax and finasteride per urology - continue foley per urology; likely outpatient TOV for cath removal

## 2021-01-15 NOTE — Assessment & Plan Note (Signed)
-   likely precipitated by sepsis - TSH normal - use PRN lopressor

## 2021-01-15 NOTE — Progress Notes (Signed)
Peripherally Inserted Central Catheter Placement  The IV Nurse has discussed with the patient and/or persons authorized to consent for the patient, the purpose of this procedure and the potential benefits and risks involved with this procedure.  The benefits include less needle sticks, lab draws from the catheter, and the patient may be discharged home with the catheter. Risks include, but not limited to, infection, bleeding, blood clot (thrombus formation), and puncture of an artery; nerve damage and irregular heartbeat and possibility to perform a PICC exchange if needed/ordered by physician.  Alternatives to this procedure were also discussed.  Bard Power PICC patient education guide, fact sheet on infection prevention and patient information card has been provided to patient /or left at bedside.    PICC Placement Documentation  PICC Double Lumen 14/27/67 PICC Right Basilic 43 cm 0 cm (Active)  Indication for Insertion or Continuance of Line Limited venous access - need for IV therapy >5 days (PICC only) 01/15/21 1807  Exposed Catheter (cm) 0 cm 01/15/21 1807  Site Assessment Clean;Dry;Intact 01/15/21 1807  Lumen #1 Status Flushed;Blood return noted 01/15/21 1807  Lumen #2 Status Flushed;Blood return noted 01/15/21 1807  Dressing Type Transparent 01/15/21 1807  Dressing Status Clean;Dry;Intact 01/15/21 1807  Antimicrobial disc in place? Yes 01/15/21 1807  Dressing Intervention New dressing;Other (Comment) 01/15/21 1807  Dressing Change Due 01/22/21 01/15/21 1807    Consent signed by wife   Synthia Innocent 01/15/2021, 6:08 PM

## 2021-01-15 NOTE — Progress Notes (Signed)
Progress Note    Joseph Banks   YIF:[id number]  DOB: 09/12/1945  DOA: 01/12/2021     3  PCP: Glenda Chroman, MD  CC: perianal pain  Hospital Course: Joseph Banks is a 75 y.o. male with history of diabetes mellitus who has been experiencing perianal pain and had been to Dr. Jeffie Pollock urologist on December 25, 2020 at that time was diagnosed with prostatitis and epididymitis was given 1 shot of ceftriaxone and was placed on Bactrim.  On January 08, 2021 patient went to the ER at [facility] with complaints of worsening pain and fever 104.  At the time patient had a CT scan and also was placed patient on ciprofloxacin which patient has been taking for the last 4 days despite which patient's pain has been worsening with the worsening fever according temperatures up to 104 degree.  Patient also has been increasing difficulty urinating with urine becoming more dark.   ED Course: In the ER patient is tachycardic and febrile with CT scan done today showing persistent mass in the bladder and enlarged prostate which I discussed with on-call urologist Dr. Junious Silk who wanted patient to be transferred to [facility].  Blood cultures obtained and patient was placed on empiric antibiotics.  Labs show worsening of his platelet counts which was 128 on January 08, 2021 and it is around 4 today and patient's lactic acid 2 at admission is now 3.2 is getting further bolus of fluid and patient also has elevated LFTs when compared to one done 4 days ago.  Patient went to A. fib with RVR rate improved with Cardizem bolus 10 mg.  No previous history of A. fib.  COVID test is negative.  Interval History:  Briefly afebrile yesterday but again with fevers this morning. For me he unfortunately only moans when touched and won't allow me to open his eyes or passively move extremities or head/neck.   ROS: Review of systems not obtained due to patient factors. Cognitive impairment   Assessment & Plan:  Severe sepsis -  bacterial prostatitis has been leading differential/diagnosis at this time; urology following as well - s/p foley placement by urology on admission -Continue vancomycin and meropenem, follow cultures and de-escalate as able -Remains febrile.  Concern for source control.  - blood culture repeated on 7/6; remains negative from 7/4 - also difficulty getting blood draws now; given negative blood cx, will order PICC at this time - differential at this time includes prostatic abscess vs other unidentified infection (concerning some about recent trip to [facility] as well as persistent fevers, and rigid extremities/head/neck) - discussed with ID who will also evaluate - for now, obtaining CT H/C/A/P and LP with radiology - add doxy after discussion with ID esp given thrombocytopenia (which may also just in be in setting of severe sepsis)  A. fib with RVR - likely precipitated by sepsis - TSH normal - use PRN lopressor  AKI -resolved - presumed from outlet obstruction; now s/p foley placed -Renal function has normalized - BMP daily   BPH - continue flomax and finasteride per urology - continue foley   Thrombocytopenia - For now presumed due to acute illness but see further workup above - Trend CBC  Recent dysphagia - SLP eval: Dysphagia level 3 diet ordered but too encephalopathic - NGT placed on 7/6 and TF started   DMII - continue SSI and CBG monitoring   Old records reviewed in assessment of this patient  Antimicrobials: Vanc 7/4 >>  current  Meropenem 7/4 >> current Doxy 7/7 >> current   DVT prophylaxis: SCDs Start: 01/12/21 1736   Code Status:   Code Status: Full Code Family Communication: wife  Disposition Plan: Status is: Inpatient  Remains inpatient appropriate because:IV treatments appropriate due to intensity of illness or inability to take PO and Inpatient level of care appropriate due to severity of illness  Dispo: The patient is from: Home              Anticipated  d/c is to:  pending PT eval              Patient currently is not medically stable to d/c.   Difficult to place patient No  Risk of unplanned readmission score: Unplanned Admission- Pilot do not use: 12.11   Objective: Blood pressure (!) 145/64, pulse (!) 106, temperature 98.9 F (37.2 C), temperature source Axillary, resp. rate (!) 21, height 6\' 3"  (1.905 m), weight 110.4 kg, SpO2 90 %.  Examination: General appearance:  Laying in bed moaning and will not follow commands Head: Normocephalic, without obvious abnormality, atraumatic Neck: Will not allow me to passively move neck sideways or flexion Eyes:  Right eye pupil irregular which is chronic/baseline ; will fight to keep eyes closed when trying to open them Lungs: clear to auscultation bilaterally Heart: irregularly irregular rhythm, S1, S2 normal, and tachycardic Abdomen: normal findings: bowel sounds normal and soft, non-tender Extremities:  No edema Skin: mobility and turgor normal Neurologic: Unable to follow commands and unable to fully assess  Consultants:  Urology Pulmonary/critical care - signed off ID  Procedures:    Data Reviewed: I have personally reviewed following labs and imaging studies Results for orders placed or performed during the hospital encounter of 01/12/21 (from the past 24 hour(s))  Phosphorus     Status: None   Collection Time: 01/14/21  1:29 PM  Result Value Ref Range   Phosphorus 4.3 2.5 - 4.6 mg/dL  Glucose, capillary     Status: Abnormal   Collection Time: 01/14/21  4:54 PM  Result Value Ref Range   Glucose-Capillary 165 (H) 70 - 99 mg/dL   Comment 1 Notify RN    Comment 2 Document in Chart   Phosphorus     Status: None   Collection Time: 01/14/21  6:02 PM  Result Value Ref Range   Phosphorus 3.5 2.5 - 4.6 mg/dL  Glucose, capillary     Status: Abnormal   Collection Time: 01/14/21  7:30 PM  Result Value Ref Range   Glucose-Capillary 141 (H) 70 - 99 mg/dL  Glucose, capillary      Status: Abnormal   Collection Time: 01/14/21 11:04 PM  Result Value Ref Range   Glucose-Capillary 189 (H) 70 - 99 mg/dL  Glucose, capillary     Status: Abnormal   Collection Time: 01/15/21  3:13 AM  Result Value Ref Range   Glucose-Capillary 165 (H) 70 - 99 mg/dL  Glucose, capillary     Status: Abnormal   Collection Time: 01/15/21  7:40 AM  Result Value Ref Range   Glucose-Capillary 129 (H) 70 - 99 mg/dL  Glucose, capillary     Status: Abnormal   Collection Time: 01/15/21 11:17 AM  Result Value Ref Range   Glucose-Capillary 206 (H) 70 - 99 mg/dL    Recent Results (from the past 240 hour(s))  Blood culture (routine single)     Status: None (Preliminary result)   Collection Time: 01/12/21 12:57 PM   Specimen: Right Antecubital; Blood  Result Value Ref Range Status   Specimen Description   Final    RIGHT ANTECUBITAL BOTTLES DRAWN AEROBIC AND ANAEROBIC   Special Requests Blood Culture adequate volume  Final   Culture   Final    NO GROWTH 2 DAYS Performed at [facility], [address], [place], [place] [address]    Report Status PENDING  Incomplete  Urine culture     Status: Abnormal   Collection Time: 01/12/21  1:08 PM   Specimen: In/Out Cath Urine  Result Value Ref Range Status   Specimen Description   Final    IN/OUT CATH URINE Performed at [facility], [address] [place], [place] [address]    Special Requests   Final    NONE Performed at [facility], [address] [place], [place] [address]    Culture 1,000 COLONIES/mL STAPHYLOCOCCUS EPIDERMIDIS (A)  Final   Report Status 01/15/2021 FINAL  Final   Organism ID, Bacteria STAPHYLOCOCCUS EPIDERMIDIS (A)  Final      Susceptibility   Staphylococcus epidermidis - MIC*    CIPROFLOXACIN >=8 RESISTANT Resistant     GENTAMICIN <=0.5 SENSITIVE Sensitive     NITROFURANTOIN <=16 SENSITIVE Sensitive     OXACILLIN <=0.25 SENSITIVE Sensitive     TETRACYCLINE 2 SENSITIVE Sensitive     VANCOMYCIN 2 SENSITIVE Sensitive      TRIMETH/SULFA >=320 RESISTANT Resistant     CLINDAMYCIN <=0.25 SENSITIVE Sensitive     RIFAMPIN <=0.5 SENSITIVE Sensitive     Inducible Clindamycin NEGATIVE Sensitive     * 1,000 COLONIES/mL STAPHYLOCOCCUS EPIDERMIDIS  Resp Panel by RT-PCR (Flu A&B, Covid) Nasopharyngeal Swab     Status: None   Collection Time: 01/12/21  1:09 PM   Specimen: Nasopharyngeal Swab; Nasopharyngeal(NP) swabs in vial transport medium  Result Value Ref Range Status   SARS Coronavirus 2 by RT PCR NEGATIVE NEGATIVE Final    Comment: (NOTE) SARS-CoV-2 target nucleic acids are NOT DETECTED.  The SARS-CoV-2 RNA is generally detectable in upper respiratory specimens during the acute phase of infection. The lowest concentration of SARS-CoV-2 viral copies this assay can detect is 138 copies/mL. A negative result does not preclude SARS-Cov-2 infection and should not be used as the sole basis for treatment or other patient management decisions. A negative result may occur with  improper specimen collection/handling, submission of specimen other than nasopharyngeal swab, presence of viral mutation(s) within the areas targeted by this assay, and inadequate number of viral copies(<138 copies/mL). A negative result must be combined with clinical observations, patient history, and epidemiological information. The expected result is Negative.  Fact Sheet for Patients:  EntrepreneurPulse.com.au  Fact Sheet for Healthcare Providers:  IncredibleEmployment.be  This test is no t yet approved or cleared by the [place] FDA and  has been authorized for detection and/or diagnosis of SARS-CoV-2 by FDA under an Emergency Use Authorization (EUA). This EUA will remain  in effect (meaning this test can be used) for the duration of the COVID-19 declaration under Section 564(b)(1) of the Act, 21 U.S.C.section 360bbb-3(b)(1), unless the authorization is terminated  or revoked sooner.        Influenza A by PCR NEGATIVE NEGATIVE Final   Influenza B by PCR NEGATIVE NEGATIVE Final    Comment: (NOTE) The Xpert Xpress SARS-CoV-2/FLU/RSV plus assay is intended as an aid in the diagnosis of influenza from Nasopharyngeal swab specimens and should not be used as a sole basis for treatment. Nasal washings and aspirates are unacceptable for Xpert Xpress SARS-CoV-2/FLU/RSV testing.  Fact Sheet for Patients: EntrepreneurPulse.com.au  Fact Sheet for Healthcare Providers: IncredibleEmployment.be  This test is not yet approved or cleared by the [place] FDA and has been authorized for detection and/or diagnosis of SARS-CoV-2 by FDA under an Emergency Use Authorization (EUA). This EUA will remain in effect (meaning this test can be used) for the duration of the COVID-19 declaration under Section 564(b)(1) of the Act, 21 U.S.C. section 360bbb-3(b)(1), unless the authorization is terminated or revoked.  Performed at [facility], [address], [place], [place] [address]   MRSA Next Gen by PCR, Nasal     Status: None   Collection Time: 01/12/21  7:30 PM  Result Value Ref Range Status   MRSA by PCR Next Gen NOT DETECTED NOT DETECTED Final    Comment: (NOTE) The GeneXpert MRSA Assay (FDA approved for NASAL specimens only), is one component of a comprehensive MRSA colonization surveillance program. It is not intended to diagnose MRSA infection nor to guide or monitor treatment for MRSA infections. Test performance is not FDA approved in patients less than 32 years old. Performed at [facility], [place] [address], [place], [place] [address]   Culture, blood (single)     Status: None (Preliminary result)   Collection Time: 01/14/21  8:50 AM   Specimen: BLOOD LEFT HAND  Result Value Ref Range Status   Specimen Description   Final    BLOOD LEFT HAND Performed at [facility] [address], [place],  [place] [address]    Special Requests   Final    BOTTLES DRAWN AEROBIC ONLY Blood Culture adequate volume Performed at [facility] [address], [place], [place] [address]    Culture   Final    NO GROWTH < 24 HOURS Performed at [facility] [address] [place],  [address]    Report Status PENDING  Incomplete     Radiology Studies: DG Abd 1 View  Result Date: 01/13/2021 CLINICAL DATA:  NG tube placement EXAM: ABDOMEN - 1 VIEW COMPARISON:  CT 01/12/2021 FINDINGS: Esophageal tube tip overlies the mid to distal stomach. Gas pattern nonobstructed. Artifact over the abdomen and lower chest. IMPRESSION: Esophageal tube tip overlies the mid to distal stomach Electronically Signed   By: Donavan Foil M.D.   On: 01/13/2021 17:12   MR PELVIS WO CONTRAST  Result Date: 01/15/2021 CLINICAL DATA:  Prostatitis, rule out prostate abscess. EXAM: MRI PELVIS WITHOUT CONTRAST TECHNIQUE: Multiplanar multisequence MR imaging of the pelvis was performed. No intravenous contrast was administered. Study limited by susceptibility artifact from RIGHT hip arthroplasty and due to gross patient motion on some images. COMPARISON:  CT of the abdomen and pelvis dated January 12, 2021. FINDINGS: Urinary Tract: Hyperintense material on T1 showing T2 hypointensity about the bladder base surrounding the patient's Foley catheter. Mildly patulous appearance of the distal ureters without frank dilation. Bowel: Visualized bowel is grossly unremarkable on very limited assessment. Vascular/Lymphatic: Vascular structures are normal caliber also with limited evaluation particularly due to lack of contrast administration. No gross adenopathy in the pelvis Reproductive: Prostate is enlarged with diffuse low signal on T2 weighted images and signs of BPH. No discrete fluid collection visualized on noncontrast images within the prostate gland. Prostate measures 7.1 x 6.5 x 6.7 (volume = 160 cc) cm. Foley catheter in the  urinary bladder. Small bilateral hydroceles, testicles not fully evaluated. Other:  Signs of body wall edema.  Trace free fluid in the pelvis. Musculoskeletal:  RIGHT hip arthroplasty. No suspicious bone lesion on limited assessment. IMPRESSION: 1. No discrete fluid collection visualized on noncontrast images within the prostate gland. Marked prostatomegaly with diffuse low signal throughout the gland with changes of BPH, nonspecific and could certainly be seen in the setting of marked prostatitis. Consider PSA correlation and follow-up as warranted. 2. Decreased blood products in the urinary bladder compared to recent CT evaluation. 3. Trace free fluid in the pelvis. Electronically Signed   By: Zetta Bills M.D.   On: 01/15/2021 12:23   DG CHEST PORT 1 VIEW  Result Date: 01/14/2021 CLINICAL DATA:  Fever.  Aspiration. EXAM: PORTABLE CHEST 1 VIEW COMPARISON:  01/12/2021. FINDINGS: Feeding tube noted with tip below hemidiaphragms. Cardiomegaly. No pulmonary venous congestion. Bibasilar pulmonary infiltrates/edema. Small left pleural effusion cannot be excluded. No pneumothorax. No acute bony abnormality identified. IMPRESSION: 1.  Feeding tube noted with tip below left hemidiaphragm. 2.  Cardiomegaly.  No pulmonary venous congestion. 3. Bibasilar pulmonary infiltrates/edema. Aspiration cannot be excluded. Small left pleural effusion cannot be excluded. Electronically Signed   By: Marcello Moores  Register   On: 01/14/2021 06:57   [place] EKG SITE RITE  Result Date: 01/15/2021 If Site Rite image not attached, placement could not be confirmed due to current cardiac rhythm.  MR PELVIS WO CONTRAST  Final Result    [place] EKG SITE RITE  Final Result    DG CHEST PORT 1 VIEW  Final Result    DG Abd 1 View  Final Result    CT ABDOMEN PELVIS W CONTRAST  Final Result    DG Chest Port 1 View  Final Result    CT HEAD WO CONTRAST    (Results Pending)  CT CHEST ABDOMEN PELVIS W CONTRAST    (Results Pending)  DG FL GUIDED  LUMBAR PUNCTURE    (Results Pending)    Scheduled Meds:  Chlorhexidine Gluconate Cloth  6 each Topical Q0600   finasteride  5 mg Oral Daily   free water  100 mL Per Tube Q4H   gabapentin  600 mg Per Tube BID   insulin aspart  0-6 Units Subcutaneous Q4H   iohexol       PRN Meds: acetaminophen (TYLENOL) oral liquid 160 mg/5 mL, acetaminophen, lip balm, metoprolol tartrate, morphine injection, polyvinyl alcohol Continuous Infusions:  doxycycline (VIBRAMYCIN) IV 100 mg (01/15/21 1246)   feeding supplement (GLUCERNA 1.5 CAL) 65 mL/hr at 01/15/21 0337   meropenem (MERREM) IV Stopped (01/15/21 0617)   vancomycin Stopped (01/14/21 1949)     LOS: 3 days  Time spent: Greater than 50% of the 35 minute visit was spent in counseling/coordination of care for the patient as laid out in the A&P.   Dwyane Dee, MD [facility] 01/15/2021, 1:13 PM

## 2021-01-15 NOTE — Assessment & Plan Note (Addendum)
-   SLP eval: Dysphagia level 3 diet ordered but too encephalopathic - NGT placed on 7/6 and TF started  - watch for TF tolerance; did have an episode of vomiting - continue SLP evals in efforts to start oral nutrition again

## 2021-01-15 NOTE — Progress Notes (Signed)
Subjective: Patient still with delirium.  Wife at bedside.  He does answer questions.  Objective: Vital signs in last 24 hours: Temp:  [98 F (36.7 C)-102 F (38.9 C)] 101.6 F (38.7 C) (07/07 0800) Pulse Rate:  [45-137] 45 (07/07 0635) Resp:  [14-29] 22 (07/07 0635) BP: (96-166)/(48-103) 145/64 (07/07 0635) SpO2:  [92 %-99 %] 96 % (07/07 0635) Weight:  [110.4 kg] 110.4 kg (07/07 0500)  Intake/Output from previous day: 07/06 0701 - 07/07 0700 In: 2417.3 [I.V.:1369; NG/GT:826.4; IV Piggyback:221.9] Out: 1200 [Urine:1200] Intake/Output this shift: No intake/output data recorded.  Physical Exam:  Constitutional: Vital signs reviewed.  Cardiovascular: Tachycardic Pulmonary/Chest: Normal effort Genitourinary: Catheter still present, draining clear pink urine without clots. Extremities: No cyanosis or edema   Lab Results: Recent Labs    01/12/21 1257 01/13/21 0245 01/14/21 0227  HGB 11.8* 10.7* 11.2*  HCT 33.8* 30.4* 33.2*   BMET Recent Labs    01/13/21 0849 01/14/21 0227  NA 135 134*  K 3.8 4.0  CL 104 103  CO2 21* 22  GLUCOSE 140* 131*  BUN 22 23  CREATININE 1.04 1.05  CALCIUM 8.0* 7.8*   Recent Labs    01/12/21 1257 01/12/21 1807  INR 1.0 1.0   No results for input(s): LABURIN in the last 72 hours. Results for orders placed or performed during the hospital encounter of 01/12/21  Blood culture (routine single)     Status: None (Preliminary result)   Collection Time: 01/12/21 12:57 PM   Specimen: Right Antecubital; Blood  Result Value Ref Range Status   Specimen Description   Final    RIGHT ANTECUBITAL BOTTLES DRAWN AEROBIC AND ANAEROBIC   Special Requests Blood Culture adequate volume  Final   Culture   Final    NO GROWTH 2 DAYS Performed at [facility], [address] [place], [place] [address]    Report Status PENDING  Incomplete  Urine culture     Status: Abnormal   Collection Time: 01/12/21  1:08 PM   Specimen: In/Out Cath Urine  Result  Value Ref Range Status   Specimen Description   Final    IN/OUT CATH URINE Performed at [facility], [address] [place], [place] [address]    Special Requests   Final    NONE Performed at [facility], [address] [place], [place] [address]    Culture 1,000 COLONIES/mL STAPHYLOCOCCUS EPIDERMIDIS (A)  Final   Report Status 01/15/2021 FINAL  Final   Organism ID, Bacteria STAPHYLOCOCCUS EPIDERMIDIS (A)  Final      Susceptibility   Staphylococcus epidermidis - MIC*    CIPROFLOXACIN >=8 RESISTANT Resistant     GENTAMICIN <=0.5 SENSITIVE Sensitive     NITROFURANTOIN <=16 SENSITIVE Sensitive     OXACILLIN <=0.25 SENSITIVE Sensitive     TETRACYCLINE 2 SENSITIVE Sensitive     VANCOMYCIN 2 SENSITIVE Sensitive     TRIMETH/SULFA >=320 RESISTANT Resistant     CLINDAMYCIN <=0.25 SENSITIVE Sensitive     RIFAMPIN <=0.5 SENSITIVE Sensitive     Inducible Clindamycin NEGATIVE Sensitive     * 1,000 COLONIES/mL STAPHYLOCOCCUS EPIDERMIDIS  Resp Panel by RT-PCR (Flu A&B, Covid) Nasopharyngeal Swab     Status: None   Collection Time: 01/12/21  1:09 PM   Specimen: Nasopharyngeal Swab; Nasopharyngeal(NP) swabs in vial transport medium  Result Value Ref Range Status   SARS Coronavirus 2 by RT PCR NEGATIVE NEGATIVE Final    Comment: (NOTE) SARS-CoV-2 target nucleic acids are NOT DETECTED.  The SARS-CoV-2 RNA  is generally detectable in upper respiratory specimens during the acute phase of infection. The lowest concentration of SARS-CoV-2 viral copies this assay can detect is 138 copies/mL. A negative result does not preclude SARS-Cov-2 infection and should not be used as the sole basis for treatment or other patient management decisions. A negative result may occur with  improper specimen collection/handling, submission of specimen other than nasopharyngeal swab, presence of viral mutation(s) within the areas targeted by this assay, and inadequate number of viral copies(<138 copies/mL). A  negative result must be combined with clinical observations, patient history, and epidemiological information. The expected result is Negative.  Fact Sheet for Patients:  EntrepreneurPulse.com.au  Fact Sheet for Healthcare Providers:  IncredibleEmployment.be  This test is no t yet approved or cleared by the [place] FDA and  has been authorized for detection and/or diagnosis of SARS-CoV-2 by FDA under an Emergency Use Authorization (EUA). This EUA will remain  in effect (meaning this test can be used) for the duration of the COVID-19 declaration under Section 564(b)(1) of the Act, 21 U.S.C.section 360bbb-3(b)(1), unless the authorization is terminated  or revoked sooner.       Influenza A by PCR NEGATIVE NEGATIVE Final   Influenza B by PCR NEGATIVE NEGATIVE Final    Comment: (NOTE) The Xpert Xpress SARS-CoV-2/FLU/RSV plus assay is intended as an aid in the diagnosis of influenza from Nasopharyngeal swab specimens and should not be used as a sole basis for treatment. Nasal washings and aspirates are unacceptable for Xpert Xpress SARS-CoV-2/FLU/RSV testing.  Fact Sheet for Patients: EntrepreneurPulse.com.au  Fact Sheet for Healthcare Providers: IncredibleEmployment.be  This test is not yet approved or cleared by the [place] FDA and has been authorized for detection and/or diagnosis of SARS-CoV-2 by FDA under an Emergency Use Authorization (EUA). This EUA will remain in effect (meaning this test can be used) for the duration of the COVID-19 declaration under Section 564(b)(1) of the Act, 21 U.S.C. section 360bbb-3(b)(1), unless the authorization is terminated or revoked.  Performed at [facility], [address] [place], [place] [address]   MRSA Next Gen by PCR, Nasal     Status: None   Collection Time: 01/12/21  7:30 PM  Result Value Ref Range Status   MRSA by PCR Next Gen NOT DETECTED NOT  DETECTED Final    Comment: (NOTE) The GeneXpert MRSA Assay (FDA approved for NASAL specimens only), is one component of a comprehensive MRSA colonization surveillance program. It is not intended to diagnose MRSA infection nor to guide or monitor treatment for MRSA infections. Test performance is not FDA approved in patients less than 51 years old. Performed at [facility], [place] [address], [place], [place] [address]   Culture, blood (single)     Status: None (Preliminary result)   Collection Time: 01/14/21  8:50 AM   Specimen: BLOOD LEFT HAND  Result Value Ref Range Status   Specimen Description   Final    BLOOD LEFT HAND Performed at [facility] [address], [place], [place] [address]    Special Requests   Final    BOTTLES DRAWN AEROBIC ONLY Blood Culture adequate volume Performed at [facility] [address], [place], [place] [address]    Culture   Final    NO GROWTH < 24 HOURS Performed at [facility] [address], [place], [place] [address]    Report Status PENDING  Incomplete    Studies/Results: DG Abd 1 View  Result Date: 01/13/2021 CLINICAL DATA:  NG tube placement EXAM: ABDOMEN - 1 VIEW COMPARISON:  CT 01/12/2021 FINDINGS: Esophageal tube tip overlies the mid to distal stomach. Gas pattern nonobstructed. Artifact over the abdomen and lower chest. IMPRESSION: Esophageal tube tip overlies the mid to distal stomach Electronically Signed   By: Donavan Foil M.D.   On: 01/13/2021 17:12   DG CHEST PORT 1 VIEW  Result Date: 01/14/2021 CLINICAL DATA:  Fever.  Aspiration. EXAM: PORTABLE CHEST 1 VIEW COMPARISON:  01/12/2021. FINDINGS: Feeding tube noted with tip below hemidiaphragms. Cardiomegaly. No pulmonary venous congestion. Bibasilar pulmonary infiltrates/edema. Small left pleural effusion cannot be excluded. No pneumothorax. No acute bony abnormality identified. IMPRESSION: 1.  Feeding tube noted with tip  below left hemidiaphragm. 2.  Cardiomegaly.  No pulmonary venous congestion. 3. Bibasilar pulmonary infiltrates/edema. Aspiration cannot be excluded. Small left pleural effusion cannot be excluded. Electronically Signed   By: Marcello Moores  Register   On: 01/14/2021 06:57   [place] EKG SITE RITE  Result Date: 01/15/2021 If Site Rite image not attached, placement could not be confirmed due to current cardiac rhythm.   Assessment/Plan:  UTI/possible prostatitis/rule out prostatic abscess.  Still with delirium and fevers.  I worry that there is an undrained prostatic abscess.  I have spoken with radiology/Dr. Clovis Riley.  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  I put the order in for an MRI.  Hopefully, he can hold still enough to have this done.  I will follow-up on that.  If significant abscess, will need drainage either percutaneously or with TUR. 27 mins spent evaluating pt/discussion w/ radiology/speaking w/ wife.   LOS: 3 days   Jorja Loa 01/15/2021, 9:49 AM

## 2021-01-15 NOTE — Progress Notes (Signed)
Pharmacy Antibiotic Note  Joseph Banks is a 75 y.o. male admitted on 01/12/2021 with sepsis.  Pharmacy has been consulted for Vanco, Merrem dosing.  CC/HPI: Loss of appetite with body aches. Sepsis. Fever, thrombocytopenia, elevated lactate, elevated LFT's.  Recent prostatitis/UTI s/p 2 weeks of Cipro and Septra.  PMH: Recent prostatitis, R epididymitis, BPH, BOO,  ID: Temp 102.2. WBC remains wnl.   Recent prostatitis/UTI s/p 2 weeks of Cipro and Septra.  Plan: Merrem incr to 2g q8 Vanc 2g q24, AUC 495, SCr 1.26 used Cont febrile, TRH noted pt "stiff", plan MRI of prostate  ID consulted, added Doxycycline 100mg  IV q12, recommended increasing Meropenem dose  7/4 MRSA PCR: neg 7/4 UCx: 1000 colonies Staph epi - final. Cipro & Bactrim resistant 7/4 BCx 1set: ngtd 7/6 BCx 1set: ngtd  Prev Cx 6/16 UCx: Ecoli pan sens.  7/4 CTX x1 7/4 Merrem >> 7/4 Vanco >> 7/7 Doxycycline >>  7/7: Meropenem 1gm q8 > 2gm q8  Height: 6\' 3"  (190.5 cm) Weight: 110.4 kg (243 lb 6.2 oz) IBW/kg (Calculated) : 84.5  Temp (24hrs), Avg:100.2 F (37.9 C), Min:98 F (36.7 C), Max:102 F (38.9 C)  Recent Labs  Lab 01/12/21 1257 01/12/21 1506 01/12/21 1807 01/12/21 1943 01/12/21 1944 01/13/21 0245 01/13/21 0849 01/13/21 1552 01/14/21 0227  WBC 6.0  --   --   --   --  7.7  --   --  7.1  CREATININE 1.26*  --   --   --  1.11 1.08 1.04  --  1.05  LATICACIDVEN 2.0* 3.2* 3.0* 2.3*  --   --  2.5* 2.9*  --      Estimated Creatinine Clearance: 82.8 mL/min (by C-G formula based on SCr of 1.05 mg/dL).    No Known Allergies   Minda Ditto PharmD 01/15/2021 10:55 AM

## 2021-01-15 NOTE — Assessment & Plan Note (Addendum)
-   For now presumed due to acute illness but see further workup above - Trend CBC - resolved on 7/10

## 2021-01-15 NOTE — Consult Note (Signed)
[facility]  Total days of antibiotics 4/meropenem/ day 3 vanco/day 1 doxy               Reason for Consult: fever of unknown origin, possible prostatitis    Referring Physician: girguis  Principal Problem:   Severe sepsis ([place]) Active Problems:   Acute lower UTI   Diabetes mellitus type 2 in nonobese (HCC)   Elevated LFTs   AKI (acute kidney injury) ([place])   Atrial fibrillation with RVR (HCC)   BPH (benign prostatic hyperplasia)   Thrombocytopenia ([place])   Dysphagia    HPI: Joseph Banks is a 75 y.o. male DM, AFIB, BPH, recently had covid on June, treated for uti in mid June with bactrim and subsequently still had ongoing fevers where he was admitted on 6/30 for evaluation of temp of 104.68F at outside hospital and was discharged on 4 day course of cipro. Due to still feeling poorly, not responding to abtx, he presented to hospital on 7/4 for fever, altered mental status but hemodynamically stable, but wbc surprisingly not elevated at WBC 7K. He was placed on broad spectrum abtx while cultures return. He was on vancomycin plus meropenem- however fever continues to persist. He is intermittently answering questions with 1-2 words but not at baseline. ID consulted to help with guiding management and work up. He underwent chest CT/abd/pelvis which was unrevealing; has pleural effusion, no intra-abdominal process, no prostatic abscess. Labs significant for platelet and mildly elevated LA at 3. Wife and daughter at his bedside report that he has long standing difficulty with urination, recently treated for uti. Traveled back from [place] in early June.  Past Medical History:  Diagnosis Date   BPH (benign prostatic hyperplasia)    Diabetes mellitus without complication (HCC)     Allergies: No Known Allergies  MEDICATIONS:  Chlorhexidine Gluconate Cloth  6 each Topical Q0600   finasteride  5 mg Oral Daily   free water  100 mL Per Tube Q4H   gabapentin  600 mg  Per Tube BID   insulin aspart  0-6 Units Subcutaneous Q4H    Social History   Tobacco Use   Smoking status: Former    Pack years: 0.00   Smokeless tobacco: Never    History reviewed. No pertinent family history.  Review of Systems -  Unable to obtain since patient is encephalopathic  OBJECTIVE: Temp:  [98.5 F (36.9 C)-102 F (38.9 C)] 98.5 F (36.9 C) (07/07 1600) Pulse Rate:  [45-137] 101 (07/07 1600) Resp:  [14-29] 23 (07/07 1600) BP: (98-154)/(55-121) 149/121 (07/07 1600) SpO2:  [87 %-97 %] 87 % (07/07 1600) Weight:  [110.4 kg] 110.4 kg (07/07 0500) Physical Exam  Constitutional: He is oriented to person, only. He appears well-developed and well-nourished. Appears flushed HENT:  Mouth/Throat: Oropharynx is clear and moist. No oropharyngeal exudate.  Neck: mild nuchal rigidity Cardiovascular: Normal rate, regular rhythm and normal heart sounds. Exam reveals no gallop and no friction rub.  No murmur heard.  Pulmonary/Chest: Effort normal and breath sounds normal. No respiratory distress. He has no wheezes.  Abdominal: Soft. Bowel sounds are normal. He exhibits no distension. There is no tenderness.  Lymphadenopathy:  He has no cervical adenopathy.  Neurological: He is alert and oriented to person, only Rigidity to upper extremities. Not following commands Skin: Skin is warm and dry. No rash noted. No erythema. Flushed cheeks and arms and chest Psychiatric: He is intermittently answering yes-no questions.   LABS: Results for  orders placed or performed during the hospital encounter of 01/12/21 (from the past 48 hour(s))  Glucose, capillary     Status: Abnormal   Collection Time: 01/13/21  7:34 PM  Result Value Ref Range   Glucose-Capillary 178 (H) 70 - 99 mg/dL    Comment: Glucose reference range applies only to samples taken after fasting for at least 8 hours.  Glucose, capillary     Status: Abnormal   Collection Time: 01/13/21 11:19 PM  Result Value Ref Range    Glucose-Capillary 133 (H) 70 - 99 mg/dL    Comment: Glucose reference range applies only to samples taken after fasting for at least 8 hours.  CBC with Differential/Platelet     Status: Abnormal   Collection Time: 01/14/21  2:27 AM  Result Value Ref Range   WBC 7.1 4.0 - 10.5 K/uL   RBC 3.58 (L) 4.22 - 5.81 MIL/uL   Hemoglobin 11.2 (L) 13.0 - 17.0 g/dL   HCT 33.2 (L) 39.0 - 52.0 %   MCV 92.7 80.0 - 100.0 fL   MCH 31.3 26.0 - 34.0 pg   MCHC 33.7 30.0 - 36.0 g/dL   RDW 14.7 11.5 - 15.5 %   Platelets 71 (L) 150 - 400 K/uL    Comment: SPECIMEN CHECKED FOR CLOTS Immature Platelet Fraction may be clinically indicated, consider ordering this additional test [id number] CONSISTENT WITH PREVIOUS RESULT REPEATED TO VERIFY    nRBC 0.0 0.0 - 0.2 %   Neutrophils Relative % 56 %   Neutro Abs 4.1 1.7 - 7.7 K/uL   Lymphocytes Relative 32 %   Lymphs Abs 2.3 0.7 - 4.0 K/uL   Monocytes Relative 7 %   Monocytes Absolute 0.5 0.1 - 1.0 K/uL   Eosinophils Relative 3 %   Eosinophils Absolute 0.2 0.0 - 0.5 K/uL   Basophils Relative 1 %   Basophils Absolute 0.0 0.0 - 0.1 K/uL   Immature Granulocytes 1 %   Abs Immature Granulocytes 0.06 0.00 - 0.07 K/uL   Reactive, Benign Lymphocytes PRESENT     Comment: Performed at [facility], [place] [address] [place], [place] [address]  Magnesium     Status: None   Collection Time: 01/14/21  2:27 AM  Result Value Ref Range   Magnesium 1.8 1.7 - 2.4 mg/dL    Comment: Performed at [facility], [place] [address] [place], [place] [address]  Basic metabolic panel     Status: Abnormal   Collection Time: 01/14/21  2:27 AM  Result Value Ref Range   Sodium 134 (L) 135 - 145 mmol/L   Potassium 4.0 3.5 - 5.1 mmol/L   Chloride 103 98 - 111 mmol/L   CO2 22 22 - 32 mmol/L   Glucose, Bld 131 (H) 70 - 99 mg/dL    Comment: Glucose reference range applies only to samples taken after fasting for at least 8 hours.   BUN 23 8 - 23 mg/dL    Creatinine, Ser 1.05 0.61 - 1.24 mg/dL   Calcium 7.8 (L) 8.9 - 10.3 mg/dL   GFR, Estimated >60 >60 mL/min    Comment: (NOTE) Calculated using the CKD-EPI Creatinine Equation (2021)    Anion gap 9 5 - 15    Comment: Performed at [facility], [place] [address], [place], [place] [address]  TSH     Status: None   Collection Time: 01/14/21  2:27 AM  Result Value Ref Range   TSH 1.043 0.350 - 4.500 uIU/mL  Comment: Performed by a 3rd Generation assay with a functional sensitivity of <=0.01 uIU/mL. Performed at [facility], [place] [address] [place], [place] [address]   Glucose, capillary     Status: Abnormal   Collection Time: 01/14/21  3:37 AM  Result Value Ref Range   Glucose-Capillary 115 (H) 70 - 99 mg/dL    Comment: Glucose reference range applies only to samples taken after fasting for at least 8 hours.  Glucose, capillary     Status: Abnormal   Collection Time: 01/14/21  7:47 AM  Result Value Ref Range   Glucose-Capillary 125 (H) 70 - 99 mg/dL    Comment: Glucose reference range applies only to samples taken after fasting for at least 8 hours.   Comment 1 Notify RN    Comment 2 Document in Chart   Culture, blood (single)     Status: None (Preliminary result)   Collection Time: 01/14/21  8:50 AM   Specimen: BLOOD LEFT HAND  Result Value Ref Range   Specimen Description      BLOOD LEFT HAND Performed at [facility] [address], [place], [place] [address]    Special Requests      BOTTLES DRAWN AEROBIC ONLY Blood Culture adequate volume Performed at [facility] [address] [place], [place] [address]    Culture      NO GROWTH < 24 HOURS Performed at [facility] [address], [place], [place] [address]    Report Status PENDING   Glucose, capillary     Status: Abnormal   Collection Time: 01/14/21 11:23 AM  Result Value Ref Range   Glucose-Capillary 156 (H) 70 - 99 mg/dL     Comment: Glucose reference range applies only to samples taken after fasting for at least 8 hours.   Comment 1 Notify RN    Comment 2 Document in Chart   Phosphorus     Status: None   Collection Time: 01/14/21  1:29 PM  Result Value Ref Range   Phosphorus 4.3 2.5 - 4.6 mg/dL    Comment: Performed at [facility], [place] [address], [place], [place] [address]  Glucose, capillary     Status: Abnormal   Collection Time: 01/14/21  4:54 PM  Result Value Ref Range   Glucose-Capillary 165 (H) 70 - 99 mg/dL    Comment: Glucose reference range applies only to samples taken after fasting for at least 8 hours.   Comment 1 Notify RN    Comment 2 Document in Chart   Phosphorus     Status: None   Collection Time: 01/14/21  6:02 PM  Result Value Ref Range   Phosphorus 3.5 2.5 - 4.6 mg/dL    Comment: Performed at [facility], [place] [address] [place], [place] [address]  Glucose, capillary     Status: Abnormal   Collection Time: 01/14/21  7:30 PM  Result Value Ref Range   Glucose-Capillary 141 (H) 70 - 99 mg/dL    Comment: Glucose reference range applies only to samples taken after fasting for at least 8 hours.  Glucose, capillary     Status: Abnormal   Collection Time: 01/14/21 11:04 PM  Result Value Ref Range   Glucose-Capillary 189 (H) 70 - 99 mg/dL    Comment: Glucose reference range applies only to samples taken after fasting for at least 8 hours.  Glucose, capillary     Status: Abnormal   Collection Time: 01/15/21  3:13 AM  Result Value Ref Range   Glucose-Capillary 165 (H) 70 - 99 mg/dL    Comment: Glucose reference range applies only to samples taken after fasting for at least 8 hours.  Glucose, capillary     Status: Abnormal   Collection Time: 01/15/21  7:40 AM  Result Value Ref Range   Glucose-Capillary 129 (H) 70 - 99 mg/dL    Comment: Glucose reference range applies only to samples taken after fasting for at least 8 hours.  Glucose, capillary      Status: Abnormal   Collection Time: 01/15/21 11:17 AM  Result Value Ref Range   Glucose-Capillary 206 (H) 70 - 99 mg/dL    Comment: Glucose reference range applies only to samples taken after fasting for at least 8 hours.  Glucose, capillary     Status: Abnormal   Collection Time: 01/15/21  4:20 PM  Result Value Ref Range   Glucose-Capillary 143 (H) 70 - 99 mg/dL    Comment: Glucose reference range applies only to samples taken after fasting for at least 8 hours.    MICRO: reviewed IMAGING: CT HEAD WO CONTRAST  Result Date: 01/15/2021 CLINICAL DATA:  Fever. EXAM: CT HEAD WITHOUT CONTRAST TECHNIQUE: Contiguous axial images were obtained from the base of the skull through the vertex without intravenous contrast. COMPARISON:  None. FINDINGS: Brain: Patient had difficulty tolerating the exam there is moderate motion artifact allowing for motion, no intracranial hemorrhage. No mass effect or midline shift. No hydrocephalus. The basilar cisterns are patent. No evidence of territorial infarct or acute ischemia. No extra-axial or intracranial fluid collection. Vascular: No hyperdense vessel. Mild carotid skull base atherosclerosis. Skull: No fracture or focal lesion. Sinuses/Orbits: Postsurgical change of the right globe. No acute findings. Other: None. IMPRESSION: Motion limited exam. No acute intracranial abnormality. Electronically Signed   By: Keith Rake M.D.   On: 01/15/2021 15:42   MR PELVIS WO CONTRAST  Result Date: 01/15/2021 CLINICAL DATA:  Prostatitis, rule out prostate abscess. EXAM: MRI PELVIS WITHOUT CONTRAST TECHNIQUE: Multiplanar multisequence MR imaging of the pelvis was performed. No intravenous contrast was administered. Study limited by susceptibility artifact from RIGHT hip arthroplasty and due to gross patient motion on some images. COMPARISON:  CT of the abdomen and pelvis dated January 12, 2021. FINDINGS: Urinary Tract: Hyperintense material on T1 showing T2 hypointensity about  the bladder base surrounding the patient's Foley catheter. Mildly patulous appearance of the distal ureters without frank dilation. Bowel: Visualized bowel is grossly unremarkable on very limited assessment. Vascular/Lymphatic: Vascular structures are normal caliber also with limited evaluation particularly due to lack of contrast administration. No gross adenopathy in the pelvis Reproductive: Prostate is enlarged with diffuse low signal on T2 weighted images and signs of BPH. No discrete fluid collection visualized on noncontrast images within the prostate gland. Prostate measures 7.1 x 6.5 x 6.7 (volume = 160 cc) cm. Foley catheter in the urinary bladder. Small bilateral hydroceles, testicles not fully evaluated. Other:  Signs of body wall edema.  Trace free fluid in the pelvis. Musculoskeletal: RIGHT hip arthroplasty. No suspicious bone lesion on limited assessment. IMPRESSION: 1. No discrete fluid collection visualized on noncontrast images within the prostate gland. Marked prostatomegaly with diffuse low signal throughout the gland with changes of BPH, nonspecific and could certainly be seen in the setting of marked prostatitis. Consider PSA correlation and follow-up as warranted. 2. Decreased blood products in the urinary bladder compared to recent CT evaluation. 3. Trace free fluid in the pelvis. Electronically  Signed   By: Zetta Bills M.D.   On: 01/15/2021 12:23   CT CHEST ABDOMEN PELVIS W CONTRAST  Result Date: 01/15/2021 CLINICAL DATA:  Persistent fevers.  Evaluate for infectious source. EXAM: CT CHEST, ABDOMEN, AND PELVIS WITH CONTRAST TECHNIQUE: Multidetector CT imaging of the chest, abdomen and pelvis was performed following the standard protocol during bolus administration of intravenous contrast. CONTRAST:  160mL OMNIPAQUE IOHEXOL 300 MG/ML  SOLN COMPARISON:  MRI pelvis 01/15/2021 and CT scan 01/12/2021 FINDINGS: CT CHEST FINDINGS Cardiovascular: The heart is normal in size. No pericardial  effusion. The aorta is normal in caliber. No dissection. Scattered coronary artery calcifications. Mediastinum/Nodes: No mediastinal or hilar mass or lymphadenopathy. There is an NG tube coursing down the esophagus and into the stomach. Lungs/Pleura: Small to moderate-sized bilateral pleural effusions with overlying atelectasis. No pulmonary infiltrates or pulmonary edema. No worrisome pulmonary lesions. Few small perifissural nodules are likely benign lymph nodes. Musculoskeletal: No significant bony findings. CT ABDOMEN PELVIS FINDINGS Hepatobiliary: No hepatic lesions or intrahepatic biliary dilatation. The gallbladder is grossly normal. No common bile duct dilatation. Pancreas: No mass, inflammation or ductal dilatation. Spleen: Normal size.  No focal lesions. Adrenals/Urinary Tract: The adrenal glands and kidneys are unremarkable. No renal lesions or obstructing ureteral calculi. No bladder mass is identified. There is a Foley catheter in the bladder and some residual hematoma. Stomach/Bowel: The stomach, duodenum, small bowel and colon are unremarkable. No acute inflammatory changes, mass lesions or obstructive findings. Vascular/Lymphatic: Stable aortic and iliac artery calcifications. No aneurysm. The major venous structures are patent. No mesenteric or retroperitoneal mass or adenopathy. Small scattered lymph nodes are stable. Reproductive: Significant artifact from the right hip prosthesis. The prostate gland is markedly enlarged but no obvious abscess. Other: Small amount of free abdominal and free pelvic fluid is noted but no rim enhancing abscess. Body wall edema is noted mainly in the flank areas. 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 IMPRESSION: 1. Small to moderate-sized bilateral pleural effusions with overlying atelectasis. 2. Small amount of free abdominal and free pelvic fluid but no  rim enhancing abscess. 3. Markedly enlarged prostate gland but no obvious abscess. 4. Foley catheter in the bladder with some residual hematoma. Aortic Atherosclerosis (ICD10-I70.0).  Foot Electronically Signed   By: Marijo Sanes M.D.   On: 01/15/2021 15:53   DG CHEST PORT 1 VIEW  Result Date: 01/14/2021 CLINICAL DATA:  Fever.  Aspiration. EXAM: PORTABLE CHEST 1 VIEW COMPARISON:  01/12/2021. FINDINGS: Feeding tube noted with tip below hemidiaphragms. Cardiomegaly. No pulmonary venous congestion. Bibasilar pulmonary infiltrates/edema. Small left pleural effusion cannot be excluded. No pneumothorax. No acute bony abnormality identified. IMPRESSION: 1.  Feeding tube noted with tip below left hemidiaphragm. 2.  Cardiomegaly.  No pulmonary venous congestion. 3. Bibasilar pulmonary infiltrates/edema. Aspiration cannot be excluded. Small left pleural effusion cannot be excluded. Electronically Signed   By: Marcello Moores  Register   On: 01/14/2021 06:57   [place] EKG SITE RITE  Result Date: 01/15/2021 If Site Rite image not attached, placement could not be confirmed due to current cardiac rhythm.    Assessment/Plan: 75yo M with fever for roughly 7-10d, feeling poorly, being treated for presumed prostatitis on broad spectrum abtx but not improving in terms of encephalopathy nor fever curve. Imaging showing bibasilar pulmonary edema, enlarged prostate  - recommend to stop iv vancomycin as it can possibly can be linked with low platelets - thrombocytopenia - possibly from  sepsis, drug related, or infection -such as tickborne illness. Agree with continue doxycycline for the time being - will change meropenem to ceftriaxone and ampicillin (which would provide listeria coverage (amp) and GNR/strep coverage) - please get LP for cell count, hsv pcr, aerobic culture, protein, and glucose.

## 2021-01-15 NOTE — Progress Notes (Signed)
Pt unable to hold still for MRI. Best obtainable images turned in for radiologist to possible read. Pt moaning while in scanner and kicking legs the entire time. Series modified to attempt to reduce motion artifact. MRI Pelvis without was completed unable scan any further due to motion.

## 2021-01-15 NOTE — Assessment & Plan Note (Addendum)
-   Fever, tachycardia, lactic acidosis.  Source presumed prostatitis on admission; after no significant improvement and no prostatic abscess noted on repeat MRI pelvis on 01/15/2021, further work-up was pursued (see encephalitis) - s/p foley placement by urology on admission -Initially vanc/mero. Changed to CTX, amp, doxy on 7/7. ID also consulted at that time -Fevers persisted intermittently; have now defervesced - blood cultures remain negative from 7/4 and 7/6 - PICC placed 7/7 - differential had included prostatic abscess (ruled out on MRI on 7/7) vs other unidentified infection such as CNS (concerning some about recent trip to [facility] as well as persistent fevers, and rigid extremities/head/neck) - CT H/C/A/P also unrevealing but some motion artifact but grossly normal - underwent LP on 7/8 under conscious sedation -Rocephin and ampicillin discontinued by ID on 01/17/2021 after LP results - see encephalitis

## 2021-01-16 ENCOUNTER — Inpatient Hospital Stay (HOSPITAL_COMMUNITY): Payer: Medicare HMO

## 2021-01-16 DIAGNOSIS — R1310 Dysphagia, unspecified: Secondary | ICD-10-CM | POA: Diagnosis not present

## 2021-01-16 DIAGNOSIS — A419 Sepsis, unspecified organism: Secondary | ICD-10-CM | POA: Diagnosis not present

## 2021-01-16 DIAGNOSIS — R6520 Severe sepsis without septic shock: Secondary | ICD-10-CM | POA: Diagnosis not present

## 2021-01-16 HISTORY — PX: IR FLUORO GUIDED NEEDLE PLC ASPIRATION/INJECTION LOC: IMG2395

## 2021-01-16 LAB — COMPREHENSIVE METABOLIC PANEL
ALT: 73 U/L — ABNORMAL HIGH (ref 0–44)
AST: 90 U/L — ABNORMAL HIGH (ref 15–41)
Albumin: 1.8 g/dL — ABNORMAL LOW (ref 3.5–5.0)
Alkaline Phosphatase: 54 U/L (ref 38–126)
Anion gap: 3 — ABNORMAL LOW (ref 5–15)
BUN: 26 mg/dL — ABNORMAL HIGH (ref 8–23)
CO2: 25 mmol/L (ref 22–32)
Calcium: 7.4 mg/dL — ABNORMAL LOW (ref 8.9–10.3)
Chloride: 109 mmol/L (ref 98–111)
Creatinine, Ser: 0.82 mg/dL (ref 0.61–1.24)
GFR, Estimated: >60 mL/min (ref >60)
Glucose, Bld: 166 mg/dL — ABNORMAL HIGH (ref 70–99)
Potassium: 4.1 mmol/L (ref 3.5–5.1)
Sodium: 137 mmol/L (ref 135–145)
Total Bilirubin: 0.6 mg/dL (ref 0.3–1.2)
Total Protein: 4.8 g/dL — ABNORMAL LOW (ref 6.5–8.1)

## 2021-01-16 LAB — PROTEIN AND GLUCOSE, CSF
Glucose, CSF: 68 mg/dL (ref 40–70)
Total  Protein, CSF: 128 mg/dL — ABNORMAL HIGH (ref 15–45)

## 2021-01-16 LAB — CBC WITH DIFFERENTIAL/PLATELET
Abs Immature Granulocytes: 0.04 10*3/uL (ref 0.00–0.07)
Basophils Absolute: 0.1 10*3/uL (ref 0.0–0.1)
Basophils Relative: 1 %
Eosinophils Absolute: 0.1 10*3/uL (ref 0.0–0.5)
Eosinophils Relative: 1 %
HCT: 33.6 % — ABNORMAL LOW (ref 39.0–52.0)
Hemoglobin: 11.2 g/dL — ABNORMAL LOW (ref 13.0–17.0)
Immature Granulocytes: 1 %
Lymphocytes Relative: 45 %
Lymphs Abs: 3.8 10*3/uL (ref 0.7–4.0)
MCH: 30.9 pg (ref 26.0–34.0)
MCHC: 33.3 g/dL (ref 30.0–36.0)
MCV: 92.6 fL (ref 80.0–100.0)
Monocytes Absolute: 0.4 10*3/uL (ref 0.1–1.0)
Monocytes Relative: 5 %
Neutro Abs: 3.9 10*3/uL (ref 1.7–7.7)
Neutrophils Relative %: 47 %
Platelets: 98 10*3/uL — ABNORMAL LOW (ref 150–400)
RBC: 3.63 MIL/uL — ABNORMAL LOW (ref 4.22–5.81)
RDW: 15.1 % (ref 11.5–15.5)
WBC: 8.2 10*3/uL (ref 4.0–10.5)
nRBC: 0 % (ref 0.0–0.2)

## 2021-01-16 LAB — CSF CELL COUNT WITH DIFFERENTIAL
Eosinophils, CSF: 0 % (ref 0–1)
Eosinophils, CSF: 1 % (ref 0–1)
Lymphs, CSF: 88 % — ABNORMAL HIGH (ref 40–80)
Lymphs, CSF: 91 % — ABNORMAL HIGH (ref 40–80)
Monocyte-Macrophage-Spinal Fluid: 1 % — ABNORMAL LOW (ref 15–45)
Monocyte-Macrophage-Spinal Fluid: 2 % — ABNORMAL LOW (ref 15–45)
RBC Count, CSF: 4 /mm3 — ABNORMAL HIGH
RBC Count, CSF: 6 /mm3 — ABNORMAL HIGH
Segmented Neutrophils-CSF: 10 % — ABNORMAL HIGH (ref 0–6)
Segmented Neutrophils-CSF: 7 % — ABNORMAL HIGH (ref 0–6)
Tube #: 1
Tube #: 4
WBC, CSF: 32 /mm3 (ref 0–5)
WBC, CSF: 43 /mm3 (ref 0–5)

## 2021-01-16 LAB — SEDIMENTATION RATE: Sed Rate: 3 mm/hr (ref 0–16)

## 2021-01-16 LAB — GLUCOSE, CAPILLARY
Glucose-Capillary: 161 mg/dL — ABNORMAL HIGH (ref 70–99)
Glucose-Capillary: 159 mg/dL — ABNORMAL HIGH (ref 70–99)
Glucose-Capillary: 145 mg/dL — ABNORMAL HIGH (ref 70–99)
Glucose-Capillary: 147 mg/dL — ABNORMAL HIGH (ref 70–99)
Glucose-Capillary: 135 mg/dL — ABNORMAL HIGH (ref 70–99)

## 2021-01-16 LAB — C-REACTIVE PROTEIN: CRP: 1.3 mg/dL — ABNORMAL HIGH (ref <1.0)

## 2021-01-16 LAB — MAGNESIUM: Magnesium: 2.1 mg/dL (ref 1.7–2.4)

## 2021-01-16 LAB — FERRITIN: Ferritin: 1459 ng/mL — ABNORMAL HIGH (ref 24–336)

## 2021-01-16 LAB — PHOSPHORUS: Phosphorus: 3 mg/dL (ref 2.5–4.6)

## 2021-01-16 MED ORDER — SODIUM CHLORIDE 0.9 % IV SOLN
INTRAVENOUS | Status: DC | PRN
  Administered 2021-01-16 – 2021-01-18 (×2): 250 mL via INTRAVENOUS

## 2021-01-16 MED ORDER — MIDAZOLAM HCL 2 MG/2ML IJ SOLN
INTRAMUSCULAR | Status: AC | PRN
  Administered 2021-01-16 (×2): 1 mg via INTRAVENOUS

## 2021-01-16 MED ORDER — LIDOCAINE HCL (PF) 1 % IJ SOLN
INTRAMUSCULAR | Status: AC
  Filled 2021-01-16: qty 30

## 2021-01-16 MED ORDER — DIPHENHYDRAMINE HCL 50 MG/ML IJ SOLN
INTRAMUSCULAR | Status: AC
  Filled 2021-01-16: qty 1

## 2021-01-16 MED ORDER — LIDOCAINE HCL (PF) 1 % IJ SOLN
INTRAMUSCULAR | Status: AC | PRN
  Administered 2021-01-16: 10 mL

## 2021-01-16 MED ORDER — FENTANYL CITRATE (PF) 100 MCG/2ML IJ SOLN
INTRAMUSCULAR | Status: AC | PRN
  Administered 2021-01-16: 50 ug via INTRAVENOUS

## 2021-01-16 MED ORDER — FENTANYL CITRATE (PF) 100 MCG/2ML IJ SOLN
INTRAMUSCULAR | Status: AC
  Filled 2021-01-16: qty 4

## 2021-01-16 MED ORDER — DIPHENHYDRAMINE HCL 50 MG/ML IJ SOLN
INTRAMUSCULAR | Status: AC | PRN
  Administered 2021-01-16: 25 mg via INTRAVENOUS

## 2021-01-16 MED ORDER — MIDAZOLAM HCL 2 MG/2ML IJ SOLN
INTRAMUSCULAR | Status: AC
  Filled 2021-01-16: qty 6

## 2021-01-16 NOTE — Progress Notes (Signed)
Subjective: Wife reports that she thinks patient is a bit better.  MRI l yesterday revealed no obvious intra prostatic abscess  Objective: Vital signs in last 24 hours: Temp:  [97.6 F (36.4 C)-102 F (38.9 C)] 99 F (37.2 C) (07/08 0331) Pulse Rate:  [50-127] 106 (07/08 0600) Resp:  [16-25] 19 (07/08 0600) BP: (96-149)/(59-121) 96/66 (07/08 0600) SpO2:  [87 %-100 %] 93 % (07/08 0600) Weight:  [113.7 kg] 113.7 kg (07/08 0434)  Intake/Output from previous day: 07/07 0701 - 07/08 0700 In: 2350.2 [NG/GT:1181.1; IV Piggyback:1024.1] Out: 720 [Urine:600; Stool:120] Intake/Output this shift: No intake/output data recorded.  Physical Exam:  Constitutional: Vital signs reviewed.  Patient sleeping.  Wife at bedside. Eyes: PERRL, No scleral icterus.   Cardiovascular: RRR Pulmonary/Chest: Normal effort: Extremities: No cyanosis or edema   Lab Results: Recent Labs    01/14/21 0227 01/16/21 0422  HGB 11.2* 11.2*  HCT 33.2* 33.6*   BMET Recent Labs    01/14/21 0227 01/16/21 0422  NA 134* 137  K 4.0 4.1  CL 103 109  CO2 22 25  GLUCOSE 131* 166*  BUN 23 26*  CREATININE 1.05 0.82  CALCIUM 7.8* 7.4*   No results for input(s): LABPT, INR in the last 72 hours. No results for input(s): LABURIN in the last 72 hours. Results for orders placed or performed during the hospital encounter of 01/12/21  Blood culture (routine single)     Status: None (Preliminary result)   Collection Time: 01/12/21 12:57 PM   Specimen: Right Antecubital; Blood  Result Value Ref Range Status   Specimen Description   Final    RIGHT ANTECUBITAL BOTTLES DRAWN AEROBIC AND ANAEROBIC   Special Requests Blood Culture adequate volume  Final   Culture   Final    NO GROWTH 4 DAYS Performed at [facility], [address], [place], [place] [address]    Report Status PENDING  Incomplete  Urine culture     Status: Abnormal   Collection Time: 01/12/21  1:08 PM   Specimen: In/Out Cath Urine  Result Value  Ref Range Status   Specimen Description   Final    IN/OUT CATH URINE Performed at [facility], [address], [place], [place] [address]    Special Requests   Final    NONE Performed at [facility], [address] [place], [place] [address]    Culture 1,000 COLONIES/mL STAPHYLOCOCCUS EPIDERMIDIS (A)  Final   Report Status 01/15/2021 FINAL  Final   Organism ID, Bacteria STAPHYLOCOCCUS EPIDERMIDIS (A)  Final      Susceptibility   Staphylococcus epidermidis - MIC*    CIPROFLOXACIN >=8 RESISTANT Resistant     GENTAMICIN <=0.5 SENSITIVE Sensitive     NITROFURANTOIN <=16 SENSITIVE Sensitive     OXACILLIN <=0.25 SENSITIVE Sensitive     TETRACYCLINE 2 SENSITIVE Sensitive     VANCOMYCIN 2 SENSITIVE Sensitive     TRIMETH/SULFA >=320 RESISTANT Resistant     CLINDAMYCIN <=0.25 SENSITIVE Sensitive     RIFAMPIN <=0.5 SENSITIVE Sensitive     Inducible Clindamycin NEGATIVE Sensitive     * 1,000 COLONIES/mL STAPHYLOCOCCUS EPIDERMIDIS  Resp Panel by RT-PCR (Flu A&B, Covid) Nasopharyngeal Swab     Status: None   Collection Time: 01/12/21  1:09 PM   Specimen: Nasopharyngeal Swab; Nasopharyngeal(NP) swabs in vial transport medium  Result Value Ref Range Status   SARS Coronavirus 2 by RT PCR NEGATIVE NEGATIVE Final    Comment: (NOTE) SARS-CoV-2 target nucleic acids are NOT DETECTED.  The  SARS-CoV-2 RNA is generally detectable in upper respiratory specimens during the acute phase of infection. The lowest concentration of SARS-CoV-2 viral copies this assay can detect is 138 copies/mL. A negative result does not preclude SARS-Cov-2 infection and should not be used as the sole basis for treatment or other patient management decisions. A negative result may occur with  improper specimen collection/handling, submission of specimen other than nasopharyngeal swab, presence of viral mutation(s) within the areas targeted by this assay, and inadequate number of viral copies(<138 copies/mL). A negative  result must be combined with clinical observations, patient history, and epidemiological information. The expected result is Negative.  Fact Sheet for Patients:  EntrepreneurPulse.com.au  Fact Sheet for Healthcare Providers:  IncredibleEmployment.be  This test is no t yet approved or cleared by the [place] FDA and  has been authorized for detection and/or diagnosis of SARS-CoV-2 by FDA under an Emergency Use Authorization (EUA). This EUA will remain  in effect (meaning this test can be used) for the duration of the COVID-19 declaration under Section 564(b)(1) of the Act, 21 U.S.C.section 360bbb-3(b)(1), unless the authorization is terminated  or revoked sooner.       Influenza A by PCR NEGATIVE NEGATIVE Final   Influenza B by PCR NEGATIVE NEGATIVE Final    Comment: (NOTE) The Xpert Xpress SARS-CoV-2/FLU/RSV plus assay is intended as an aid in the diagnosis of influenza from Nasopharyngeal swab specimens and should not be used as a sole basis for treatment. Nasal washings and aspirates are unacceptable for Xpert Xpress SARS-CoV-2/FLU/RSV testing.  Fact Sheet for Patients: EntrepreneurPulse.com.au  Fact Sheet for Healthcare Providers: IncredibleEmployment.be  This test is not yet approved or cleared by the [place] FDA and has been authorized for detection and/or diagnosis of SARS-CoV-2 by FDA under an Emergency Use Authorization (EUA). This EUA will remain in effect (meaning this test can be used) for the duration of the COVID-19 declaration under Section 564(b)(1) of the Act, 21 U.S.C. section 360bbb-3(b)(1), unless the authorization is terminated or revoked.  Performed at [facility], [address], [place], [place] [address]   MRSA Next Gen by PCR, Nasal     Status: None   Collection Time: 01/12/21  7:30 PM  Result Value Ref Range Status   MRSA by PCR Next Gen NOT DETECTED NOT DETECTED  Final    Comment: (NOTE) The GeneXpert MRSA Assay (FDA approved for NASAL specimens only), is one component of a comprehensive MRSA colonization surveillance program. It is not intended to diagnose MRSA infection nor to guide or monitor treatment for MRSA infections. Test performance is not FDA approved in patients less than 45 years old. Performed at [facility], [place] [address] [place], [place] [address]   Culture, blood (single)     Status: None (Preliminary result)   Collection Time: 01/14/21  8:50 AM   Specimen: BLOOD LEFT HAND  Result Value Ref Range Status   Specimen Description   Final    BLOOD LEFT HAND Performed at [place] [address], [place], [place] [address]    Special Requests   Final    BOTTLES DRAWN AEROBIC ONLY Blood Culture adequate volume Performed at [place] [address] [place], [place] [address]    Culture   Final    NO GROWTH < 24 HOURS Performed at [facility] [address], [place],  [address]    Report Status PENDING  Incomplete    Studies/Results: CT HEAD WO  CONTRAST  Result Date: 01/15/2021 CLINICAL DATA:  Fever. EXAM: CT HEAD WITHOUT CONTRAST TECHNIQUE: Contiguous axial images were obtained from the base of the skull through the vertex without intravenous contrast. COMPARISON:  None. FINDINGS: Brain: Patient had difficulty tolerating the exam there is moderate motion artifact allowing for motion, no intracranial hemorrhage. No mass effect or midline shift. No hydrocephalus. The basilar cisterns are patent. No evidence of territorial infarct or acute ischemia. No extra-axial or intracranial fluid collection. Vascular: No hyperdense vessel. Mild carotid skull base atherosclerosis. Skull: No fracture or focal lesion. Sinuses/Orbits: Postsurgical change of the right globe. No acute findings. Other: None. IMPRESSION: Motion limited exam. No acute intracranial  abnormality. Electronically Signed   By: Keith Rake M.D.   On: 01/15/2021 15:42   MR PELVIS WO CONTRAST  Result Date: 01/15/2021 CLINICAL DATA:  Prostatitis, rule out prostate abscess. EXAM: MRI PELVIS WITHOUT CONTRAST TECHNIQUE: Multiplanar multisequence MR imaging of the pelvis was performed. No intravenous contrast was administered. Study limited by susceptibility artifact from RIGHT hip arthroplasty and due to gross patient motion on some images. COMPARISON:  CT of the abdomen and pelvis dated January 12, 2021. FINDINGS: Urinary Tract: Hyperintense material on T1 showing T2 hypointensity about the bladder base surrounding the patient's Foley catheter. Mildly patulous appearance of the distal ureters without frank dilation. Bowel: Visualized bowel is grossly unremarkable on very limited assessment. Vascular/Lymphatic: Vascular structures are normal caliber also with limited evaluation particularly due to lack of contrast administration. No gross adenopathy in the pelvis Reproductive: Prostate is enlarged with diffuse low signal on T2 weighted images and signs of BPH. No discrete fluid collection visualized on noncontrast images within the prostate gland. Prostate measures 7.1 x 6.5 x 6.7 (volume = 160 cc) cm. Foley catheter in the urinary bladder. Small bilateral hydroceles, testicles not fully evaluated. Other:  Signs of body wall edema.  Trace free fluid in the pelvis. Musculoskeletal: RIGHT hip arthroplasty. No suspicious bone lesion on limited assessment. IMPRESSION: 1. No discrete fluid collection visualized on noncontrast images within the prostate gland. Marked prostatomegaly with diffuse low signal throughout the gland with changes of BPH, nonspecific and could certainly be seen in the setting of marked prostatitis. Consider PSA correlation and follow-up as warranted. 2. Decreased blood products in the urinary bladder compared to recent CT evaluation. 3. Trace free fluid in the pelvis. Electronically  Signed   By: Zetta Bills M.D.   On: 01/15/2021 12:23   CT CHEST ABDOMEN PELVIS W CONTRAST  Result Date: 01/15/2021 CLINICAL DATA:  Persistent fevers.  Evaluate for infectious source. EXAM: CT CHEST, ABDOMEN, AND PELVIS WITH CONTRAST TECHNIQUE: Multidetector CT imaging of the chest, abdomen and pelvis was performed following the standard protocol during bolus administration of intravenous contrast. CONTRAST:  135mL OMNIPAQUE IOHEXOL 300 MG/ML  SOLN COMPARISON:  MRI pelvis 01/15/2021 and CT scan 01/12/2021 FINDINGS: CT CHEST FINDINGS Cardiovascular: The heart is normal in size. No pericardial effusion. The aorta is normal in caliber. No dissection. Scattered coronary artery calcifications. Mediastinum/Nodes: No mediastinal or hilar mass or lymphadenopathy. There is an NG tube coursing down the esophagus and into the stomach. Lungs/Pleura: Small to moderate-sized bilateral pleural effusions with overlying atelectasis. No pulmonary infiltrates or pulmonary edema. No worrisome pulmonary lesions. Few small perifissural nodules are likely benign lymph nodes. Musculoskeletal: No significant bony findings. CT ABDOMEN PELVIS FINDINGS Hepatobiliary: No hepatic lesions or intrahepatic biliary dilatation. The gallbladder is grossly normal. No common bile duct dilatation. Pancreas: No mass, inflammation or ductal dilatation.  Spleen: Normal size.  No focal lesions. Adrenals/Urinary Tract: The adrenal glands and kidneys are unremarkable. No renal lesions or obstructing ureteral calculi. No bladder mass is identified. There is a Foley catheter in the bladder and some residual hematoma. Stomach/Bowel: The stomach, duodenum, small bowel and colon are unremarkable. No acute inflammatory changes, mass lesions or obstructive findings. Vascular/Lymphatic: Stable aortic and iliac artery calcifications. No aneurysm. The major venous structures are patent. No mesenteric or retroperitoneal mass or adenopathy. Small scattered lymph nodes  are stable. Reproductive: Significant artifact from the right hip prosthesis. The prostate gland is markedly enlarged but no obvious abscess. Other: Small amount of free abdominal and free pelvic fluid is noted but no rim enhancing abscess. Body wall edema is noted mainly in the flank areas. 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 IMPRESSION: 1. Small to moderate-sized bilateral pleural effusions with overlying atelectasis. 2. Small amount of free abdominal and free pelvic fluid but no rim enhancing abscess. 3. Markedly enlarged prostate gland but no obvious abscess. 4. Foley catheter in the bladder with some residual hematoma. Aortic Atherosclerosis (ICD10-I70.0).  Foot Electronically Signed   By: Marijo Sanes M.D.   On: 01/15/2021 15:53   DG Chest Port 1 View  Result Date: 01/15/2021 CLINICAL DATA:  PICC line placement EXAM: PORTABLE CHEST 1 VIEW COMPARISON:  01/14/2021, CT 01/15/2021 FINDINGS: Esophageal tube tip extends below diaphragm but is incompletely visualized. Right upper extremity central venous catheter tip faintly visible over the SVC. Bilateral pleural effusions. Slight worsening of consolidation at left lung base. Cardiomegaly with vascular congestion. IMPRESSION: 1. Right upper extremity central venous catheter tip overlies the SVC. 2. Cardiomegaly with vascular congestion. Bilateral pleural effusions with slight worsening of consolidation at left lung base Electronically Signed   By: Donavan Foil M.D.   On: 01/15/2021 18:51   [place] EKG SITE RITE  Result Date: 01/15/2021 If Site Rite image not attached, placement could not be confirmed due to current cardiac rhythm.    I reviewed the patient's MRI Assessment/Plan:  History of urinary tract infection/possible prostatitis.  Recent MRI revealed no evidence of intra prostatic abscess.  At this point, no further urologic intervention  needed, but we will continue to follow.  His urine seems to be clearing a bit.  If and when he does become more awake and ambulatory, we will give him a voiding trial.   LOS: 4 days   Jorja Loa 01/16/2021, 7:57 AM

## 2021-01-16 NOTE — Progress Notes (Signed)
SLP Cancellation Note  Patient Details Name: ODARIUS DINES MRN: [id number] DOB: July 25, 1945   Cancelled treatment:       Reason Eval/Treat Not Completed: Other (comment) (pt for procedure in IR today, is now NPO and TF stopped, will follow up next week for readiness for po - 7/11 or 7/12)  Kathleen Lime, MS [facility] SLP Acute Rehab Services Office [phone] Pager [phone]  Macario Golds 01/16/2021, 2:17 PM

## 2021-01-16 NOTE — Progress Notes (Signed)
    BRIEF OVERNIGHT PROGRESS REPORT  Called  by RN for concern of reduced output at 0000.  As of that time patient had 111ml of brown urine reported since 1900 hours. At bedside tubing appears patent.  Bladder scan ordered due to concern of possible obstruction. Scans shows >211ml urine present.  Foley flushed with 26ml NS which immediately yielded a net output of 213ml brown urine. There were no clots,sediment, or blood present.    This puts the 5 hour (1900---0000 Hrs) urine total to roughly 230 cc output with an additional 125cc from an unknown time to 1900 hrs.  (355cc total)  Q4 bladder scan ordered due to possible intermittent foley blockage or other (possibly internal) obstructive cause.  Reduced agitation, HR, was noted with flushing and emptying of catheter after bladder scan.  Gershon Cull MSNA MSN ACNPC-AG Acute Care Nurse Practitioner [place]

## 2021-01-16 NOTE — Consult Note (Signed)
Chief Complaint: Patient was seen in consultation today for image guided lumbar puncture with IV conscious sedation Chief Complaint  Patient presents with   Groin Pain     Referring Physician(s): Colbert  Supervising Physician: Arne Cleveland  Patient Status: [facility] - In-pt  History of Present Illness: Joseph Banks is a 75 y.o. male with past medical history of diabetes, BPH, atrial fibrillation, recent COVID-19 infection last month as well as UTI in mid June treated with Bactrim.  He was admitted on 6/30 for evaluation of temperature of 104.5 at outside hospital and was discharged on 4-day course of Cipro.  Due to still feeling poorly and not responding to antibiotics he presented to [facility] on 7/ 4 with persistent fever, altered mental status but hemodynamically stable.  CT head was negative for acute abnormality, CT chest abdomen pelvis yesterday revealed small to moderate bilateral pleural effusions, small amount of free abdominal free pelvic fluid but no abscess, marked enlarged prostate but no obvious abscess and Foley catheter in bladder with some residual hematoma.  Current labs include WBC 8.2, hemoglobin 11.2, platelets 98k, PT/INR normal, creatinine normal, AST/ALT elevated, latest COVID-19 negative, urine culture with few staph epidermidis.  Current temp 98.8, BP 138/65, heart rate 93, O2 sat 95% room air.  Request now received from ID team for lumbar puncture with IV conscious sedation.  Past Medical History:  Diagnosis Date   BPH (benign prostatic hyperplasia)    Diabetes mellitus without complication ([place])     Past Surgical History:  Procedure Laterality Date   TRANSURETHRAL RESECTION OF PROSTATE      Allergies: Patient has no known allergies.  Medications: Prior to Admission medications   Medication Sig Start Date End Date Taking? Authorizing Provider  ciprofloxacin (CIPRO) 500 MG tablet Take 500 mg by mouth in the morning and at bedtime.  01/09/21 01/19/21 Yes [provider]  gabapentin (NEURONTIN) 600 MG tablet Take 1 tablet (600 mg total) by mouth 2 (two) times daily. 12/22/20  Yes Cottle, Billey Co., MD  metFORMIN (GLUCOPHAGE) 500 MG tablet Take 500 mg by mouth 2 (two) times daily. 02/07/19  Yes [provider]  tadalafil (CIALIS) 5 MG tablet Take 1 tablet (5 mg total) by mouth daily. 12/16/20  Yes Franchot Gallo, MD  traZODone (DESYREL) 50 MG tablet Take 1-2 tablets (50-100 mg total) by mouth at bedtime as needed for sleep. Patient taking differently: Take 50-100 mg by mouth at bedtime. 12/22/20  Yes Cottle, Billey Co., MD  Potassium 99 MG TABS Take by mouth. Patient not taking: No sig reported    [provider]  sulfamethoxazole-trimethoprim (BACTRIM DS) 800-160 MG tablet Take 1 tablet by mouth every 12 (twelve) hours. Patient not taking: No sig reported 12/25/20   Irine Seal, MD  tamsulosin [facility]) 0.4 MG CAPS capsule Take 2 caps daily Patient not taking: No sig reported 12/16/20   Franchot Gallo, MD     History reviewed. No pertinent family history.  Social History   Socioeconomic History   Marital status: Married    Spouse name: Not on file   Number of children: Not on file   Years of education: Not on file   Highest education level: Not on file  Occupational History   Not on file  Tobacco Use   Smoking status: Former    Pack years: 0.00   Smokeless tobacco: Never  Substance and Sexual Activity   Alcohol use: Not on file   Drug use: Not  on file   Sexual activity: Not on file  Other Topics Concern   Not on file  Social History Narrative   Not on file   Social Determinants of Health   Financial Resource Strain: Not on file  Food Insecurity: Not on file  Transportation Needs: Not on file  Physical Activity: Not on file  Stress: Not on file  Social Connections: Not on file      Review of Systems see above; currently no CP,worsening dyspnea, cough,abd/back pain,N/V or  bleeding  Vital Signs: BP 138/65 (BP Location: Left Arm)   Pulse 93   Temp 98.8 F (37.1 C) (Axillary)   Resp 18   Ht 6\' 3"  (1.905 m)   Wt 250 lb 10.6 oz (113.7 kg)   SpO2 95%   BMI 31.33 kg/m   Physical Exam patient awake but eyes closed and moaning, lethargic, family in room.  Chest with diminished breath sounds at bases.  Heart with tachycardic rate, occasional ectopy.  Abdomen soft, good bowel sounds, not obviously tender.  No lower extremity edema.  Imaging: DG Abd 1 View  Result Date: 01/13/2021 CLINICAL DATA:  NG tube placement EXAM: ABDOMEN - 1 VIEW COMPARISON:  CT 01/12/2021 FINDINGS: Esophageal tube tip overlies the mid to distal stomach. Gas pattern nonobstructed. Artifact over the abdomen and lower chest. IMPRESSION: Esophageal tube tip overlies the mid to distal stomach Electronically Signed   By: Donavan Foil M.D.   On: 01/13/2021 17:12   CT HEAD WO CONTRAST  Result Date: 01/15/2021 CLINICAL DATA:  Fever. EXAM: CT HEAD WITHOUT CONTRAST TECHNIQUE: Contiguous axial images were obtained from the base of the skull through the vertex without intravenous contrast. COMPARISON:  None. FINDINGS: Brain: Patient had difficulty tolerating the exam there is moderate motion artifact allowing for motion, no intracranial hemorrhage. No mass effect or midline shift. No hydrocephalus. The basilar cisterns are patent. No evidence of territorial infarct or acute ischemia. No extra-axial or intracranial fluid collection. Vascular: No hyperdense vessel. Mild carotid skull base atherosclerosis. Skull: No fracture or focal lesion. Sinuses/Orbits: Postsurgical change of the right globe. No acute findings. Other: None. IMPRESSION: Motion limited exam. No acute intracranial abnormality. Electronically Signed   By: Keith Rake M.D.   On: 01/15/2021 15:42   MR PELVIS WO CONTRAST  Result Date: 01/15/2021 CLINICAL DATA:  Prostatitis, rule out prostate abscess. EXAM: MRI PELVIS WITHOUT CONTRAST  TECHNIQUE: Multiplanar multisequence MR imaging of the pelvis was performed. No intravenous contrast was administered. Study limited by susceptibility artifact from RIGHT hip arthroplasty and due to gross patient motion on some images. COMPARISON:  CT of the abdomen and pelvis dated January 12, 2021. FINDINGS: Urinary Tract: Hyperintense material on T1 showing T2 hypointensity about the bladder base surrounding the patient's Foley catheter. Mildly patulous appearance of the distal ureters without frank dilation. Bowel: Visualized bowel is grossly unremarkable on very limited assessment. Vascular/Lymphatic: Vascular structures are normal caliber also with limited evaluation particularly due to lack of contrast administration. No gross adenopathy in the pelvis Reproductive: Prostate is enlarged with diffuse low signal on T2 weighted images and signs of BPH. No discrete fluid collection visualized on noncontrast images within the prostate gland. Prostate measures 7.1 x 6.5 x 6.7 (volume = 160 cc) cm. Foley catheter in the urinary bladder. Small bilateral hydroceles, testicles not fully evaluated. Other:  Signs of body wall edema.  Trace free fluid in the pelvis. Musculoskeletal: RIGHT hip arthroplasty. No suspicious bone lesion on limited assessment. IMPRESSION: 1. No discrete  fluid collection visualized on noncontrast images within the prostate gland. Marked prostatomegaly with diffuse low signal throughout the gland with changes of BPH, nonspecific and could certainly be seen in the setting of marked prostatitis. Consider PSA correlation and follow-up as warranted. 2. Decreased blood products in the urinary bladder compared to recent CT evaluation. 3. Trace free fluid in the pelvis. Electronically Signed   By: Zetta Bills M.D.   On: 01/15/2021 12:23   CT ABDOMEN PELVIS W CONTRAST  Result Date: 01/12/2021 CLINICAL DATA:  Abdominal pain. EXAM: CT ABDOMEN AND PELVIS WITH CONTRAST TECHNIQUE: Multidetector CT imaging of  the abdomen and pelvis was performed using the standard protocol following bolus administration of intravenous contrast. CONTRAST:  169mL OMNIPAQUE IOHEXOL 300 MG/ML  SOLN COMPARISON:  01/08/2021 FINDINGS: Lower chest: Subsegmental atelectasis and dependent change noted in the right lung base. Hepatobiliary: No focal liver abnormality. The gallbladder is normal. No signs of bile duct dilatation Pancreas: Unremarkable. No pancreatic ductal dilatation or surrounding inflammatory changes. Spleen: Normal in size without focal abnormality. Adrenals/Urinary Tract: Normal adrenal glands. No kidney mass identified bilaterally. Bilateral pelviectasis is new from previous exam there is mild bilateral hydroureter. Mild bladder distension. Streak artifact from right hip arthroplasty device diminishes evaluation of the bladder. Similar to the previous exam is a heterogeneous, hyperdense mass within the bladder measuring 6.8 x 5.8 by 4.8 cm. Stomach/Bowel: Small hiatal hernia. Stomach is nondistended the appendix is not confidently identified no secondary signs of acute appendicitis no bowel wall thickening, inflammation, or distension. Vascular/Lymphatic: Aortic atherosclerosis. No retroperitoneal or mesenteric adenopathy multiple prominent, non pathologically enlarged pelvic lymph nodes are identified similar to previous exam for example, right external iliac node measures 0.9 cm, image 68/2. Borderline enlarged bilateral inguinal lymph nodes are identified. Index left inguinal lymph node measures 1.3 cm, image 98/2. Index right inguinal node measures 1.2 cm, image 98/2. Reproductive: Prostate gland appears enlarged with mass effect upon the bladder base measuring approximate 6.9 x 5.6 x 7.4 cm (volume = 150 cm^3). Other: No free fluid or fluid collections. 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 Similar appearance of heterogeneous, hyperdense mass within the bladder which may represent a large blood clot or neoplasm. Evaluation the bladder is diminished secondary to beam hardening artifact from patient's right hip arthroplasty device. Bladder ultrasound may be helpful in the acute setting to assess the nature of this mass. 2. Bilateral pelviectasis and hydroureter. 3. Marked prostate gland enlargement with mass effect upon the bladder base. 4. 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 5. Aortic atherosclerosis. Aortic Atherosclerosis (ICD10-I70.0). Electronically Signed   By: Kerby Moors M.D.   On: 01/12/2021 15:55   CT CHEST ABDOMEN PELVIS W CONTRAST  Result Date: 01/15/2021 CLINICAL DATA:  Persistent fevers.  Evaluate for infectious source. EXAM: CT CHEST, ABDOMEN, AND PELVIS WITH CONTRAST TECHNIQUE: Multidetector CT imaging of the chest, abdomen and pelvis was performed following the standard protocol during bolus administration of intravenous contrast. CONTRAST:  161mL OMNIPAQUE IOHEXOL 300 MG/ML  SOLN COMPARISON:  MRI pelvis 01/15/2021 and CT scan 01/12/2021 FINDINGS: CT CHEST FINDINGS Cardiovascular: The heart is normal in size. No pericardial effusion. The aorta is normal in caliber. No dissection. Scattered coronary artery calcifications. Mediastinum/Nodes: No mediastinal or hilar mass or lymphadenopathy. There is an NG tube coursing down the esophagus and into the stomach. Lungs/Pleura:  Small to moderate-sized bilateral pleural effusions with overlying atelectasis. No pulmonary infiltrates or pulmonary edema. No worrisome pulmonary lesions. Few small perifissural nodules are likely benign lymph nodes. Musculoskeletal: No significant bony findings. CT ABDOMEN PELVIS FINDINGS Hepatobiliary: No hepatic lesions or intrahepatic biliary dilatation. The gallbladder is grossly normal. No common bile duct dilatation. Pancreas: No mass, inflammation or ductal  dilatation. Spleen: Normal size.  No focal lesions. Adrenals/Urinary Tract: The adrenal glands and kidneys are unremarkable. No renal lesions or obstructing ureteral calculi. No bladder mass is identified. There is a Foley catheter in the bladder and some residual hematoma. Stomach/Bowel: The stomach, duodenum, small bowel and colon are unremarkable. No acute inflammatory changes, mass lesions or obstructive findings. Vascular/Lymphatic: Stable aortic and iliac artery calcifications. No aneurysm. The major venous structures are patent. No mesenteric or retroperitoneal mass or adenopathy. Small scattered lymph nodes are stable. Reproductive: Significant artifact from the right hip prosthesis. The prostate gland is markedly enlarged but no obvious abscess. Other: Small amount of free abdominal and free pelvic fluid is noted but no rim enhancing abscess. Body wall edema is noted mainly in the flank areas. 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 IMPRESSION: 1. Small to moderate-sized bilateral pleural effusions with overlying atelectasis. 2. Small amount of free abdominal and free pelvic fluid but no rim enhancing abscess. 3. Markedly enlarged prostate gland but no obvious abscess. 4. Foley catheter in the bladder with some residual hematoma. Aortic Atherosclerosis (ICD10-I70.0).  Foot Electronically Signed   By: Marijo Sanes M.D.   On: 01/15/2021 15:53   DG Chest Port 1 View  Result Date: 01/15/2021 CLINICAL DATA:  PICC line placement EXAM: PORTABLE CHEST 1 VIEW COMPARISON:  01/14/2021, CT 01/15/2021 FINDINGS: Esophageal tube tip extends below diaphragm but is incompletely visualized. Right upper extremity central venous catheter tip faintly visible over the SVC. Bilateral pleural effusions. Slight worsening of consolidation at left lung base. Cardiomegaly with vascular congestion. IMPRESSION: 1. Right  upper extremity central venous catheter tip overlies the SVC. 2. Cardiomegaly with vascular congestion. Bilateral pleural effusions with slight worsening of consolidation at left lung base Electronically Signed   By: Donavan Foil M.D.   On: 01/15/2021 18:51   DG CHEST PORT 1 VIEW  Result Date: 01/14/2021 CLINICAL DATA:  Fever.  Aspiration. EXAM: PORTABLE CHEST 1 VIEW COMPARISON:  01/12/2021. FINDINGS: Feeding tube noted with tip below hemidiaphragms. Cardiomegaly. No pulmonary venous congestion. Bibasilar pulmonary infiltrates/edema. Small left pleural effusion cannot be excluded. No pneumothorax. No acute bony abnormality identified. IMPRESSION: 1.  Feeding tube noted with tip below left hemidiaphragm. 2.  Cardiomegaly.  No pulmonary venous congestion. 3. Bibasilar pulmonary infiltrates/edema. Aspiration cannot be excluded. Small left pleural effusion cannot be excluded. Electronically Signed   By: Marcello Moores  Register   On: 01/14/2021 06:57   DG Chest Port 1 View  Result Date: 01/12/2021 CLINICAL DATA:  Questionable sepsis. EXAM: PORTABLE CHEST 1 VIEW COMPARISON:  Abdominal CT 12/12/2020 FINDINGS: Portable views of the chest were obtained. Few densities at the left lung base and there were small densities in this area on the previous CT topogram. Otherwise, the lungs are clear. Negative for a pneumothorax. Heart and mediastinum are within normal limits. Trachea is midline. IMPRESSION: 1. No acute cardiopulmonary disease. 2. Left basilar densities are suggestive for atelectasis or scarring. Electronically Signed   By: Markus Daft M.D.   On: 01/12/2021 13:47   [place] EKG SITE RITE  Result Date: 01/15/2021 If Site Rite image not attached, placement could not be confirmed due to current cardiac rhythm.   Labs:  CBC: Recent Labs    01/12/21 1257 01/13/21 0245 01/14/21 0227 01/16/21 0422  WBC 6.0 7.7 7.1 8.2  HGB 11.8* 10.7* 11.2* 11.2*  HCT 33.8* 30.4* 33.2* 33.6*  PLT 49* 60* 71* 98*     COAGS: Recent Labs    01/12/21 1257 01/12/21 1807  INR 1.0 1.0  APTT 32 31    BMP: Recent Labs    01/13/21 0245 01/13/21 0849 01/14/21 0227 01/16/21 0422  NA 133* 135 134* 137  K 4.3 3.8 4.0 4.1  CL 103 104 103 109  CO2 20* 21* 22 25  GLUCOSE 150* 140* 131* 166*  BUN 22 22 23  26*  CALCIUM 7.8* 8.0* 7.8* 7.4*  CREATININE 1.08 1.04 1.05 0.82  GFRNONAA >60 >60 >60 >60    LIVER FUNCTION TESTS: Recent Labs    01/12/21 1257 01/12/21 1944 01/13/21 0245 01/16/21 0422  BILITOT 0.9 0.8 1.0 0.6  AST 181* 150* 156* 90*  ALT 107* 86* 87* 73*  ALKPHOS 90 69 66 54  PROT 6.1* 5.2* 5.2* 4.8*  ALBUMIN 2.6* 2.1* 2.0* 1.8*    TUMOR MARKERS: No results for input(s): AFPTM, CEA, CA199, CHROMGRNA in the last 8760 hours.  Assessment and Plan: 75 y.o. male with past medical history of diabetes, BPH, atrial fibrillation, recent COVID-19 infection last month as well as UTI in mid June treated with Bactrim.  He was admitted on 6/30 for evaluation of temperature of 104.5 at outside hospital and was discharged on 4-day course of Cipro.  Due to still feeling poorly and not responding to antibiotics he presented to [facility] on 7/ 4 with persistent fever, altered mental status but hemodynamically stable.  CT head was negative for acute abnormality, CT chest abdomen pelvis yesterday revealed small to moderate bilateral pleural effusions, small amount of free abdominal free pelvic fluid but no abscess, marked enlarged prostate but no obvious abscess and Foley catheter in bladder with some residual hematoma.  Current labs include WBC 8.2, hemoglobin 11.2, platelets 98k, PT/INR normal, creatinine normal, AST/ALT elevated, latest COVID-19 negative, urine culture with few staph epidermidis.  Current temp 98.8, BP 138/65, heart rate 93, O2 sat 95% room air.  Request now received from ID team for lumbar puncture with IV conscious sedation.  Case reviewed by Dr. Vernard Gambles.  Details/risks of  procedure, including but not limited to, internal bleeding, infection, injury to adjacent structures, inability to safely perform procedure due to agitation despite sedation discussed with patient's wife and son with their understanding and consent.  Procedure scheduled for this afternoon.  Tube feeds stopped at 0800 today.   Thank you for this interesting consult.  I greatly enjoyed meeting Joseph Banks and look forward to participating in their care.  A copy of this report was sent to the requesting provider on this date.  Electronically Signed: D. Rowe Robert, PA-C 01/16/2021, 10:25 AM   I spent a total of 25 minutes in face to face in clinical consultation, greater than 50% of which was counseling/coordinating care for image guided lumbar puncture with IV conscious sedation

## 2021-01-16 NOTE — Progress Notes (Addendum)
[facility]    Date of Admission:  01/12/2021   Total days of antibiotics 5/ but day 2 of doxy/amp/ctx           ID: Joseph Banks is a 75 y.o. male with fuo, and encephalopathy Principal Problem:   Severe sepsis ([place]) Active Problems:   Acute lower UTI   Diabetes mellitus type 2 in nonobese (HCC)   Elevated LFTs   AKI (acute kidney injury) ([place])   Atrial fibrillation with RVR (HCC)   BPH (benign prostatic hyperplasia)   Thrombocytopenia (HCC)   Dysphagia    Subjective: Had isolated temp of 102F last night but then quickly afebrile thereafter. Underwent IR LP this afternoon.  Labs show improvement of platelets  Medications:   Chlorhexidine Gluconate Cloth  6 each Topical Q0600   diphenhydrAMINE       fentaNYL       finasteride  5 mg Oral Daily   free water  100 mL Per Tube Q4H   gabapentin  600 mg Per Tube BID   insulin aspart  0-6 Units Subcutaneous Q4H   lidocaine (PF)       midazolam       sodium chloride flush  10-40 mL Intracatheter Q12H    Objective: Vital signs in last 24 hours: Temp:  [97.6 F (36.4 C)-102 F (38.9 C)] 98.4 F (36.9 C) (07/08 1200) Pulse Rate:  [52-127] 73 (07/08 1635) Resp:  [16-28] 23 (07/08 1635) BP: (96-144)/(59-96) 122/70 (07/08 1635) SpO2:  [92 %-100 %] 96 % (07/08 1635) Weight:  [113.7 kg] 113.7 kg (07/08 0434)    Lab Results Recent Labs    01/14/21 0227 01/16/21 0422  WBC 7.1 8.2  HGB 11.2* 11.2*  HCT 33.2* 33.6*  NA 134* 137  K 4.0 4.1  CL 103 109  CO2 22 25  BUN 23 26*  CREATININE 1.05 0.82   Liver Panel Recent Labs    01/16/21 0422  PROT 4.8*  ALBUMIN 1.8*  AST 90*  ALT 73*  ALKPHOS 54  BILITOT 0.6     Microbiology: 7/6 blood cx ngtd Studies/Results: CT HEAD WO CONTRAST  Result Date: 01/15/2021 CLINICAL DATA:  Fever. EXAM: CT HEAD WITHOUT CONTRAST TECHNIQUE: Contiguous axial images were obtained from the base of the skull through the vertex without intravenous  contrast. COMPARISON:  None. FINDINGS: Brain: Patient had difficulty tolerating the exam there is moderate motion artifact allowing for motion, no intracranial hemorrhage. No mass effect or midline shift. No hydrocephalus. The basilar cisterns are patent. No evidence of territorial infarct or acute ischemia. No extra-axial or intracranial fluid collection. Vascular: No hyperdense vessel. Mild carotid skull base atherosclerosis. Skull: No fracture or focal lesion. Sinuses/Orbits: Postsurgical change of the right globe. No acute findings. Other: None. IMPRESSION: Motion limited exam. No acute intracranial abnormality. Electronically Signed   By: Keith Rake M.D.   On: 01/15/2021 15:42   MR PELVIS WO CONTRAST  Result Date: 01/15/2021 CLINICAL DATA:  Prostatitis, rule out prostate abscess. EXAM: MRI PELVIS WITHOUT CONTRAST TECHNIQUE: Multiplanar multisequence MR imaging of the pelvis was performed. No intravenous contrast was administered. Study limited by susceptibility artifact from RIGHT hip arthroplasty and due to gross patient motion on some images. COMPARISON:  CT of the abdomen and pelvis dated January 12, 2021. FINDINGS: Urinary Tract: Hyperintense material on T1 showing T2 hypointensity about the bladder base surrounding the patient's Foley catheter. Mildly patulous appearance of the distal ureters without frank dilation. Bowel: Visualized  bowel is grossly unremarkable on very limited assessment. Vascular/Lymphatic: Vascular structures are normal caliber also with limited evaluation particularly due to lack of contrast administration. No gross adenopathy in the pelvis Reproductive: Prostate is enlarged with diffuse low signal on T2 weighted images and signs of BPH. No discrete fluid collection visualized on noncontrast images within the prostate gland. Prostate measures 7.1 x 6.5 x 6.7 (volume = 160 cc) cm. Foley catheter in the urinary bladder. Small bilateral hydroceles, testicles not fully evaluated.  Other:  Signs of body wall edema.  Trace free fluid in the pelvis. Musculoskeletal: RIGHT hip arthroplasty. No suspicious bone lesion on limited assessment. IMPRESSION: 1. No discrete fluid collection visualized on noncontrast images within the prostate gland. Marked prostatomegaly with diffuse low signal throughout the gland with changes of BPH, nonspecific and could certainly be seen in the setting of marked prostatitis. Consider PSA correlation and follow-up as warranted. 2. Decreased blood products in the urinary bladder compared to recent CT evaluation. 3. Trace free fluid in the pelvis. Electronically Signed   By: Zetta Bills M.D.   On: 01/15/2021 12:23   CT CHEST ABDOMEN PELVIS W CONTRAST  Result Date: 01/15/2021 CLINICAL DATA:  Persistent fevers.  Evaluate for infectious source. EXAM: CT CHEST, ABDOMEN, AND PELVIS WITH CONTRAST TECHNIQUE: Multidetector CT imaging of the chest, abdomen and pelvis was performed following the standard protocol during bolus administration of intravenous contrast. CONTRAST:  1101mL OMNIPAQUE IOHEXOL 300 MG/ML  SOLN COMPARISON:  MRI pelvis 01/15/2021 and CT scan 01/12/2021 FINDINGS: CT CHEST FINDINGS Cardiovascular: The heart is normal in size. No pericardial effusion. The aorta is normal in caliber. No dissection. Scattered coronary artery calcifications. Mediastinum/Nodes: No mediastinal or hilar mass or lymphadenopathy. There is an NG tube coursing down the esophagus and into the stomach. Lungs/Pleura: Small to moderate-sized bilateral pleural effusions with overlying atelectasis. No pulmonary infiltrates or pulmonary edema. No worrisome pulmonary lesions. Few small perifissural nodules are likely benign lymph nodes. Musculoskeletal: No significant bony findings. CT ABDOMEN PELVIS FINDINGS Hepatobiliary: No hepatic lesions or intrahepatic biliary dilatation. The gallbladder is grossly normal. No common bile duct dilatation. Pancreas: No mass, inflammation or ductal  dilatation. Spleen: Normal size.  No focal lesions. Adrenals/Urinary Tract: The adrenal glands and kidneys are unremarkable. No renal lesions or obstructing ureteral calculi. No bladder mass is identified. There is a Foley catheter in the bladder and some residual hematoma. Stomach/Bowel: The stomach, duodenum, small bowel and colon are unremarkable. No acute inflammatory changes, mass lesions or obstructive findings. Vascular/Lymphatic: Stable aortic and iliac artery calcifications. No aneurysm. The major venous structures are patent. No mesenteric or retroperitoneal mass or adenopathy. Small scattered lymph nodes are stable. Reproductive: Significant artifact from the right hip prosthesis. The prostate gland is markedly enlarged but no obvious abscess. Other: Small amount of free abdominal and free pelvic fluid is noted but no rim enhancing abscess. Body wall edema is noted mainly in the flank areas. 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 IMPRESSION: 1. Small to moderate-sized bilateral pleural effusions with overlying atelectasis. 2. Small amount of free abdominal and free pelvic fluid but no rim enhancing abscess. 3. Markedly enlarged prostate gland but no obvious abscess. 4. Foley catheter in the bladder with some residual hematoma. Aortic Atherosclerosis (ICD10-I70.0).  Foot Electronically Signed   By: Marijo Sanes M.D.   On: 01/15/2021 15:53   DG Chest Port 1 View  Result Date: 01/15/2021 CLINICAL DATA:  PICC line placement EXAM: PORTABLE CHEST 1 VIEW COMPARISON:  01/14/2021, CT 01/15/2021 FINDINGS: Esophageal tube tip extends below diaphragm but is incompletely visualized. Right upper extremity central venous catheter tip faintly visible over the SVC. Bilateral pleural effusions. Slight worsening of consolidation at left lung base. Cardiomegaly with vascular congestion. IMPRESSION: 1. Right  upper extremity central venous catheter tip overlies the SVC. 2. Cardiomegaly with vascular congestion. Bilateral pleural effusions with slight worsening of consolidation at left lung base Electronically Signed   By: Donavan Foil M.D.   On: 01/15/2021 18:51   [place] EKG SITE RITE  Result Date: 01/15/2021 If Site Rite image not attached, placement could not be confirmed due to current cardiac rhythm.    Assessment/Plan: FUO = continue on doxy, amp, and ceftriaxone for the next 48hrs. Await CSF culture results and cell counts to see if any improvement. Will check csf cell count, glucose, protein, HSV pcr, enterovirus pcr, and vzv ig M  Will check sed rate, crp, and ferritin and cmv Ig M and RMSF ab.  Encephalopathy = possibly multifactorial, sepsis, sleep deprivation. Will reassess in the morning.  UTI, no prostatitis = imaging showing BPH not necessarily prostatitis. Has been empirically treated  [facility] Cell: [phone] Pager: [phone]  01/16/2021, 4:53 PM

## 2021-01-16 NOTE — Progress Notes (Signed)
Emptied foley catheter at 0000 to find the patient had only put out 146ml of brown urine since start of shift. Notified NP of findings, tubing is patent, with small amount of drainage. Bladder scan done for concern of possible obstruction from inside the bladder. Bladder displaced to almost the left hip and found to have greater than 231ml urine present. Foley flushed with 60ml NS and had return of 23ml dark brown clear urine, it is draining very slow like there is still a potential blockage. There were no clots or sediment present at this time or any other outward signs of what could be causing the foley to not drain appropriately.  Request for q4 bladder scan order to be placed and will make sure dayshift nurse is aware and mentions that foley is becoming obstructed.

## 2021-01-16 NOTE — Progress Notes (Addendum)
Progress Note    Joseph Banks   HEN:[id number]  DOB: Jun 12, 1946  DOA: 01/12/2021     4  PCP: Glenda Chroman, MD  CC: perianal pain  Hospital Course: Joseph Banks is a 75 y.o. male with history of diabetes mellitus who has been experiencing perianal pain and had been to Dr. Jeffie Pollock urologist on 12/25/20; at that time was diagnosed with prostatitis and epididymitis was given 1 shot of ceftriaxone and was placed on Bactrim.  On 01/09/32 patient went to the ER at [facility] with complaints of worsening pain and fever 104.  At the time patient had a CT scan and also was placed on ciprofloxacin which he was on for 4 days PTA but having worsening fever.  He also began developing more difficulty urinating with darkening urine.   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  He was started on empiric antibiotics. Furthermore, he also developed A. fib with RVR which required initial rate control with Cardizem.  Interval History:  Fever noted again last night around 8 PM. Wife and son present bedside this morning. Long discussion again held about obtaining LP for further investigation.  We will attempt to try and perform it under moderate sedation in IR today if able.  If not we will have to further discuss possible short-term intubation.  ROS: Review of systems not obtained due to patient factors. Cognitive impairment   Assessment & Plan: * Severe sepsis (HCC) - Fever, tachycardia, lactic acidosis.  Source presumed prostatitis on admission - bacterial prostatitis has been leading differential/diagnosis at this time; urology following as well - s/p foley placement by urology on admission -Initially vanc/mero. Changed to CTX, amp, doxy on 7/7. ID also consulted at that time -Remains febrile intermittently but daily.  Concern for source control. - blood culture repeated on 7/6; remains negative from 7/4 - also difficulty getting  blood draws; PICC placed 7/7 - differential at this time includes prostatic abscess (ruled out on MRI on 7/7) vs other unidentified infection such as CNS (concerning some about recent trip to [facility] as well as persistent fevers, and rigid extremities/head/neck) - CT H/C/A/P also unrevealing but some motion artifact but grossly normal - discussed at length with PCCM and IR. Tentative plan on 7/8 is moderate sedation for LP. If unsuccessful, we may have to pursue short term intubation to obtain LP (also discussed at length with wife and son bedside)  Dysphagia - SLP eval: Dysphagia level 3 diet ordered but too encephalopathic - NGT placed on 7/6 and TF started   BPH (benign prostatic hyperplasia) - continue flomax and finasteride per urology - continue foley per urology   Atrial fibrillation with RVR ([place]) - likely precipitated by sepsis - TSH normal - use PRN lopressor  AKI (acute kidney injury) ([place]) - presumed from outlet obstruction; now s/p foley placed -Renal function has normalized - BMP daily  Thrombocytopenia ([place]) - For now presumed due to acute illness but see further workup above - Trend CBC  Diabetes mellitus type 2 in nonobese ([place]) - continue SSI and CBG monitoring   Old records reviewed in assessment of this patient  Antimicrobials: Vanc 7/4 >> 7/7 Meropenem 7/4 >> 7/7 Doxy 7/7 >> current  Rocehin 7/7 >> current Ampicillin 7/7 >> current   DVT prophylaxis: SCDs Start: 01/12/21 1736   Code Status:   Code Status: Full Code Family Communication: wife  Disposition Plan: Status is: Inpatient  Remains inpatient appropriate because:IV treatments appropriate due to intensity of illness or inability to take PO and Inpatient level of care appropriate due to severity of illness  Dispo: The patient is from: Home              Anticipated d/c is to:  pending PT eval              Patient currently is not medically stable to d/c.   Difficult to place patient No  Risk  of unplanned readmission score: Unplanned Admission- Pilot do not use: 14.49   Objective: Blood pressure 132/73, pulse 86, temperature 98.4 F (36.9 C), temperature source Axillary, resp. rate 18, height 6\' 3"  (1.905 m), weight 113.7 kg, SpO2 98 %.  Examination: General appearance:  Laying in bed moaning and will not follow commands Head: Normocephalic, without obvious abnormality, atraumatic Neck: Will not allow me to passively move neck sideways or flexion Eyes:  Right eye pupil irregular which is chronic/baseline ; will fight to keep eyes closed when trying to open them Lungs: clear to auscultation bilaterally Heart: irregularly irregular rhythm, S1, S2 normal, and tachycardic Abdomen: normal findings: bowel sounds normal and soft, non-tender Extremities:  No edema Skin: mobility and turgor normal Neurologic: Unable to follow commands and unable to fully assess  Consultants:  Urology Pulmonary/critical care - signed off ID IR  Procedures:    Data Reviewed: I have personally reviewed following labs and imaging studies Results for orders placed or performed during the hospital encounter of 01/12/21 (from the past 24 hour(s))  Glucose, capillary     Status: Abnormal   Collection Time: 01/15/21  4:20 PM  Result Value Ref Range   Glucose-Capillary 143 (H) 70 - 99 mg/dL  Glucose, capillary     Status: Abnormal   Collection Time: 01/15/21  7:37 PM  Result Value Ref Range   Glucose-Capillary 148 (H) 70 - 99 mg/dL  CK     Status: Abnormal   Collection Time: 01/15/21  9:07 PM  Result Value Ref Range   Total CK 40 (L) 49 - 397 U/L  Glucose, capillary     Status: Abnormal   Collection Time: 01/15/21 11:32 PM  Result Value Ref Range   Glucose-Capillary 194 (H) 70 - 99 mg/dL  Glucose, capillary     Status: Abnormal   Collection Time: 01/16/21  3:28 AM  Result Value Ref Range   Glucose-Capillary 161 (H) 70 - 99 mg/dL  Comprehensive metabolic panel     Status: Abnormal    Collection Time: 01/16/21  4:22 AM  Result Value Ref Range   Sodium 137 135 - 145 mmol/L   Potassium 4.1 3.5 - 5.1 mmol/L   Chloride 109 98 - 111 mmol/L   CO2 25 22 - 32 mmol/L   Glucose, Bld 166 (H) 70 - 99 mg/dL   BUN 26 (H) 8 - 23 mg/dL   Creatinine, Ser 0.82 0.61 - 1.24 mg/dL   Calcium 7.4 (L) 8.9 - 10.3 mg/dL   Total Protein 4.8 (L) 6.5 - 8.1 g/dL   Albumin 1.8 (L) 3.5 - 5.0 g/dL   AST 90 (H) 15 - 41 U/L   ALT 73 (H) 0 - 44 U/L   Alkaline Phosphatase 54 38 - 126 U/L   Total Bilirubin 0.6 0.3 - 1.2 mg/dL   GFR, Estimated >60 >60 mL/min   Anion gap 3 (L) 5 - 15  CBC with Differential/Platelet     Status: Abnormal   Collection Time: 01/16/21  4:22 AM  Result Value Ref Range   WBC 8.2 4.0 - 10.5 K/uL   RBC 3.63 (L) 4.22 - 5.81 MIL/uL   Hemoglobin 11.2 (L) 13.0 - 17.0 g/dL   HCT 33.6 (L) 39.0 - 52.0 %   MCV 92.6 80.0 - 100.0 fL   MCH 30.9 26.0 - 34.0 pg   MCHC 33.3 30.0 - 36.0 g/dL   RDW 15.1 11.5 - 15.5 %   Platelets 98 (L) 150 - 400 K/uL   nRBC 0.0 0.0 - 0.2 %   Neutrophils Relative % 47 %   Neutro Abs 3.9 1.7 - 7.7 K/uL   Lymphocytes Relative 45 %   Lymphs Abs 3.8 0.7 - 4.0 K/uL   Monocytes Relative 5 %   Monocytes Absolute 0.4 0.1 - 1.0 K/uL   Eosinophils Relative 1 %   Eosinophils Absolute 0.1 0.0 - 0.5 K/uL   Basophils Relative 1 %   Basophils Absolute 0.1 0.0 - 0.1 K/uL   Immature Granulocytes 1 %   Abs Immature Granulocytes 0.04 0.00 - 0.07 K/uL   Smudge Cells PRESENT   Magnesium     Status: None   Collection Time: 01/16/21  4:22 AM  Result Value Ref Range   Magnesium 2.1 1.7 - 2.4 mg/dL  Phosphorus     Status: None   Collection Time: 01/16/21  4:22 AM  Result Value Ref Range   Phosphorus 3.0 2.5 - 4.6 mg/dL  Glucose, capillary     Status: Abnormal   Collection Time: 01/16/21  7:37 AM  Result Value Ref Range   Glucose-Capillary 159 (H) 70 - 99 mg/dL  Glucose, capillary     Status: Abnormal   Collection Time: 01/16/21 11:15 AM  Result Value Ref Range    Glucose-Capillary 145 (H) 70 - 99 mg/dL    Recent Results (from the past 240 hour(s))  Blood culture (routine single)     Status: None (Preliminary result)   Collection Time: 01/12/21 12:57 PM   Specimen: Right Antecubital; Blood  Result Value Ref Range Status   Specimen Description   Final    RIGHT ANTECUBITAL BOTTLES DRAWN AEROBIC AND ANAEROBIC   Special Requests Blood Culture adequate volume  Final   Culture   Final    NO GROWTH 4 DAYS Performed at [facility], [address] [place], [place] [address]    Report Status PENDING  Incomplete  Urine culture     Status: Abnormal   Collection Time: 01/12/21  1:08 PM   Specimen: In/Out Cath Urine  Result Value Ref Range Status   Specimen Description   Final    IN/OUT CATH URINE Performed at [facility], [address] [place], [place] [address]    Special Requests   Final    NONE Performed at [facility], [address], [place], [place] [address]    Culture 1,000 COLONIES/mL STAPHYLOCOCCUS EPIDERMIDIS (A)  Final   Report Status 01/15/2021 FINAL  Final   Organism ID, Bacteria STAPHYLOCOCCUS EPIDERMIDIS (A)  Final      Susceptibility   Staphylococcus epidermidis - MIC*    CIPROFLOXACIN >=8 RESISTANT Resistant     GENTAMICIN <=0.5 SENSITIVE Sensitive     NITROFURANTOIN <=16 SENSITIVE Sensitive     OXACILLIN <=0.25 SENSITIVE Sensitive     TETRACYCLINE 2 SENSITIVE Sensitive     VANCOMYCIN 2 SENSITIVE Sensitive     TRIMETH/SULFA >=320 RESISTANT Resistant     CLINDAMYCIN <=0.25 SENSITIVE Sensitive     RIFAMPIN <=0.5 SENSITIVE Sensitive  Inducible Clindamycin NEGATIVE Sensitive     * 1,000 COLONIES/mL STAPHYLOCOCCUS EPIDERMIDIS  Resp Panel by RT-PCR (Flu A&B, Covid) Nasopharyngeal Swab     Status: None   Collection Time: 01/12/21  1:09 PM   Specimen: Nasopharyngeal Swab; Nasopharyngeal(NP) swabs in vial transport medium  Result Value Ref Range Status   SARS Coronavirus 2 by RT PCR NEGATIVE NEGATIVE Final    Comment:  (NOTE) SARS-CoV-2 target nucleic acids are NOT DETECTED.  The SARS-CoV-2 RNA is generally detectable in upper respiratory specimens during the acute phase of infection. The lowest concentration of SARS-CoV-2 viral copies this assay can detect is 138 copies/mL. A negative result does not preclude SARS-Cov-2 infection and should not be used as the sole basis for treatment or other patient management decisions. A negative result may occur with  improper specimen collection/handling, submission of specimen other than nasopharyngeal swab, presence of viral mutation(s) within the areas targeted by this assay, and inadequate number of viral copies(<138 copies/mL). A negative result must be combined with clinical observations, patient history, and epidemiological information. The expected result is Negative.  Fact Sheet for Patients:  EntrepreneurPulse.com.au  Fact Sheet for Healthcare Providers:  IncredibleEmployment.be  This test is no t yet approved or cleared by the [place] FDA and  has been authorized for detection and/or diagnosis of SARS-CoV-2 by FDA under an Emergency Use Authorization (EUA). This EUA will remain  in effect (meaning this test can be used) for the duration of the COVID-19 declaration under Section 564(b)(1) of the Act, 21 U.S.C.section 360bbb-3(b)(1), unless the authorization is terminated  or revoked sooner.       Influenza A by PCR NEGATIVE NEGATIVE Final   Influenza B by PCR NEGATIVE NEGATIVE Final    Comment: (NOTE) The Xpert Xpress SARS-CoV-2/FLU/RSV plus assay is intended as an aid in the diagnosis of influenza from Nasopharyngeal swab specimens and should not be used as a sole basis for treatment. Nasal washings and aspirates are unacceptable for Xpert Xpress SARS-CoV-2/FLU/RSV testing.  Fact Sheet for Patients: EntrepreneurPulse.com.au  Fact Sheet for Healthcare  Providers: IncredibleEmployment.be  This test is not yet approved or cleared by the [place] FDA and has been authorized for detection and/or diagnosis of SARS-CoV-2 by FDA under an Emergency Use Authorization (EUA). This EUA will remain in effect (meaning this test can be used) for the duration of the COVID-19 declaration under Section 564(b)(1) of the Act, 21 U.S.C. section 360bbb-3(b)(1), unless the authorization is terminated or revoked.  Performed at [facility], [address] [place], [place] [address]   MRSA Next Gen by PCR, Nasal     Status: None   Collection Time: 01/12/21  7:30 PM  Result Value Ref Range Status   MRSA by PCR Next Gen NOT DETECTED NOT DETECTED Final    Comment: (NOTE) The GeneXpert MRSA Assay (FDA approved for NASAL specimens only), is one component of a comprehensive MRSA colonization surveillance program. It is not intended to diagnose MRSA infection nor to guide or monitor treatment for MRSA infections. Test performance is not FDA approved in patients less than 78 years old. Performed at [facility], [place] [address], [place], [place] [address]   Culture, blood (single)     Status: None (Preliminary result)   Collection Time: 01/14/21  8:50 AM   Specimen: BLOOD LEFT HAND  Result Value Ref Range Status   Specimen Description   Final    BLOOD LEFT HAND Performed at [facility]  [address] [place], [place] [address]    Special Requests   Final    BOTTLES DRAWN AEROBIC ONLY Blood Culture adequate volume Performed at [facility] [address] [place], [place] [address]    Culture   Final    NO GROWTH 2 DAYS Performed at [facility] [address], [place], [place] [address]    Report Status PENDING  Incomplete     Radiology Studies: CT HEAD WO CONTRAST  Result Date: 01/15/2021 CLINICAL DATA:  Fever. EXAM: CT HEAD WITHOUT CONTRAST TECHNIQUE:  Contiguous axial images were obtained from the base of the skull through the vertex without intravenous contrast. COMPARISON:  None. FINDINGS: Brain: Patient had difficulty tolerating the exam there is moderate motion artifact allowing for motion, no intracranial hemorrhage. No mass effect or midline shift. No hydrocephalus. The basilar cisterns are patent. No evidence of territorial infarct or acute ischemia. No extra-axial or intracranial fluid collection. Vascular: No hyperdense vessel. Mild carotid skull base atherosclerosis. Skull: No fracture or focal lesion. Sinuses/Orbits: Postsurgical change of the right globe. No acute findings. Other: None. IMPRESSION: Motion limited exam. No acute intracranial abnormality. Electronically Signed   By: Keith Rake M.D.   On: 01/15/2021 15:42   MR PELVIS WO CONTRAST  Result Date: 01/15/2021 CLINICAL DATA:  Prostatitis, rule out prostate abscess. EXAM: MRI PELVIS WITHOUT CONTRAST TECHNIQUE: Multiplanar multisequence MR imaging of the pelvis was performed. No intravenous contrast was administered. Study limited by susceptibility artifact from RIGHT hip arthroplasty and due to gross patient motion on some images. COMPARISON:  CT of the abdomen and pelvis dated January 12, 2021. FINDINGS: Urinary Tract: Hyperintense material on T1 showing T2 hypointensity about the bladder base surrounding the patient's Foley catheter. Mildly patulous appearance of the distal ureters without frank dilation. Bowel: Visualized bowel is grossly unremarkable on very limited assessment. Vascular/Lymphatic: Vascular structures are normal caliber also with limited evaluation particularly due to lack of contrast administration. No gross adenopathy in the pelvis Reproductive: Prostate is enlarged with diffuse low signal on T2 weighted images and signs of BPH. No discrete fluid collection visualized on noncontrast images within the prostate gland. Prostate measures 7.1 x 6.5 x 6.7 (volume = 160 cc)  cm. Foley catheter in the urinary bladder. Small bilateral hydroceles, testicles not fully evaluated. Other:  Signs of body wall edema.  Trace free fluid in the pelvis. Musculoskeletal: RIGHT hip arthroplasty. No suspicious bone lesion on limited assessment. IMPRESSION: 1. No discrete fluid collection visualized on noncontrast images within the prostate gland. Marked prostatomegaly with diffuse low signal throughout the gland with changes of BPH, nonspecific and could certainly be seen in the setting of marked prostatitis. Consider PSA correlation and follow-up as warranted. 2. Decreased blood products in the urinary bladder compared to recent CT evaluation. 3. Trace free fluid in the pelvis. Electronically Signed   By: Zetta Bills M.D.   On: 01/15/2021 12:23   CT CHEST ABDOMEN PELVIS W CONTRAST  Result Date: 01/15/2021 CLINICAL DATA:  Persistent fevers.  Evaluate for infectious source. EXAM: CT CHEST, ABDOMEN, AND PELVIS WITH CONTRAST TECHNIQUE: Multidetector CT imaging of the chest, abdomen and pelvis was performed following the standard protocol during bolus administration of intravenous contrast. CONTRAST:  130mL OMNIPAQUE IOHEXOL 300 MG/ML  SOLN COMPARISON:  MRI pelvis 01/15/2021 and CT scan 01/12/2021 FINDINGS: CT CHEST FINDINGS Cardiovascular: The heart is normal in size. No pericardial effusion. The aorta is normal in caliber. No dissection. Scattered coronary artery calcifications. Mediastinum/Nodes: No mediastinal or  hilar mass or lymphadenopathy. There is an NG tube coursing down the esophagus and into the stomach. Lungs/Pleura: Small to moderate-sized bilateral pleural effusions with overlying atelectasis. No pulmonary infiltrates or pulmonary edema. No worrisome pulmonary lesions. Few small perifissural nodules are likely benign lymph nodes. Musculoskeletal: No significant bony findings. CT ABDOMEN PELVIS FINDINGS Hepatobiliary: No hepatic lesions or intrahepatic biliary dilatation. The  gallbladder is grossly normal. No common bile duct dilatation. Pancreas: No mass, inflammation or ductal dilatation. Spleen: Normal size.  No focal lesions. Adrenals/Urinary Tract: The adrenal glands and kidneys are unremarkable. No renal lesions or obstructing ureteral calculi. No bladder mass is identified. There is a Foley catheter in the bladder and some residual hematoma. Stomach/Bowel: The stomach, duodenum, small bowel and colon are unremarkable. No acute inflammatory changes, mass lesions or obstructive findings. Vascular/Lymphatic: Stable aortic and iliac artery calcifications. No aneurysm. The major venous structures are patent. No mesenteric or retroperitoneal mass or adenopathy. Small scattered lymph nodes are stable. Reproductive: Significant artifact from the right hip prosthesis. The prostate gland is markedly enlarged but no obvious abscess. Other: Small amount of free abdominal and free pelvic fluid is noted but no rim enhancing abscess. Body wall edema is noted mainly in the flank areas. 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 IMPRESSION: 1. Small to moderate-sized bilateral pleural effusions with overlying atelectasis. 2. Small amount of free abdominal and free pelvic fluid but no rim enhancing abscess. 3. Markedly enlarged prostate gland but no obvious abscess. 4. Foley catheter in the bladder with some residual hematoma. Aortic Atherosclerosis (ICD10-I70.0).  Foot Electronically Signed   By: Marijo Sanes M.D.   On: 01/15/2021 15:53   DG Chest Port 1 View  Result Date: 01/15/2021 CLINICAL DATA:  PICC line placement EXAM: PORTABLE CHEST 1 VIEW COMPARISON:  01/14/2021, CT 01/15/2021 FINDINGS: Esophageal tube tip extends below diaphragm but is incompletely visualized. Right upper extremity central venous catheter tip faintly visible over the SVC. Bilateral pleural effusions. Slight  worsening of consolidation at left lung base. Cardiomegaly with vascular congestion. IMPRESSION: 1. Right upper extremity central venous catheter tip overlies the SVC. 2. Cardiomegaly with vascular congestion. Bilateral pleural effusions with slight worsening of consolidation at left lung base Electronically Signed   By: Donavan Foil M.D.   On: 01/15/2021 18:51   [place] EKG SITE RITE  Result Date: 01/15/2021 If Site Rite image not attached, placement could not be confirmed due to current cardiac rhythm.  DG Chest Port 1 View  Final Result    CT HEAD WO CONTRAST  Final Result    CT CHEST ABDOMEN PELVIS W CONTRAST  Final Result    MR PELVIS WO CONTRAST  Final Result    [place] EKG SITE RITE  Final Result    DG CHEST PORT 1 VIEW  Final Result    DG Abd 1 View  Final Result    CT ABDOMEN PELVIS W CONTRAST  Final Result    DG Chest Port 1 View  Final Result    IR Fluoro Guide Ndl Plmt / BX    (Results Pending)    Scheduled Meds:  Chlorhexidine Gluconate Cloth  6 each Topical Q0600   finasteride  5 mg Oral Daily   free water  100 mL Per Tube Q4H   gabapentin  600 mg Per Tube BID   insulin aspart  0-6 Units Subcutaneous Q4H   sodium chloride flush  10-40 mL Intracatheter  Q12H   PRN Meds: sodium chloride, acetaminophen (TYLENOL) oral liquid 160 mg/5 mL, acetaminophen, lip balm, LORazepam, metoprolol tartrate, morphine injection, polyvinyl alcohol, sodium chloride flush Continuous Infusions:  sodium chloride 10 mL/hr at 01/16/21 1500   ampicillin (OMNIPEN) IV Stopped (01/16/21 1218)   cefTRIAXone (ROCEPHIN)  IV Stopped (01/16/21 0959)   doxycycline (VIBRAMYCIN) IV Stopped (01/16/21 1232)   feeding supplement (GLUCERNA 1.5 CAL) Stopped (01/16/21 0800)     LOS: 4 days  Time spent: Greater than 50% of the 35 minute visit was spent in counseling/coordination of care for the patient as laid out in the A&P.   Dwyane Dee, MD [facility] 01/16/2021, 3:56 PM

## 2021-01-16 NOTE — Procedures (Signed)
  Procedure: LP under fluoroscopy with mod sedation 1ml pale clear yellow EBL:   minimal Complications:  none immediate  See full dictation in [facility].  Dillard Cannon MD Main # [phone] Pager  [phone]

## 2021-01-17 ENCOUNTER — Inpatient Hospital Stay (HOSPITAL_COMMUNITY): Payer: Medicare HMO

## 2021-01-17 DIAGNOSIS — A86 Unspecified viral encephalitis: Secondary | ICD-10-CM | POA: Diagnosis not present

## 2021-01-17 DIAGNOSIS — A419 Sepsis, unspecified organism: Secondary | ICD-10-CM | POA: Diagnosis not present

## 2021-01-17 DIAGNOSIS — R6520 Severe sepsis without septic shock: Secondary | ICD-10-CM | POA: Diagnosis not present

## 2021-01-17 LAB — CBC WITH DIFFERENTIAL/PLATELET
Abs Immature Granulocytes: 0.03 10*3/uL (ref 0.00–0.07)
Basophils Absolute: 0.1 10*3/uL (ref 0.0–0.1)
Basophils Relative: 1 %
Eosinophils Absolute: 0.1 10*3/uL (ref 0.0–0.5)
Eosinophils Relative: 1 %
HCT: 31 % — ABNORMAL LOW (ref 39.0–52.0)
Hemoglobin: 10 g/dL — ABNORMAL LOW (ref 13.0–17.0)
Immature Granulocytes: 0 %
Lymphocytes Relative: 67 %
Lymphs Abs: 6.2 10*3/uL — ABNORMAL HIGH (ref 0.7–4.0)
MCH: 30.8 pg (ref 26.0–34.0)
MCHC: 32.3 g/dL (ref 30.0–36.0)
MCV: 95.4 fL (ref 80.0–100.0)
Monocytes Absolute: 0.4 10*3/uL (ref 0.1–1.0)
Monocytes Relative: 5 %
Neutro Abs: 2.4 10*3/uL (ref 1.7–7.7)
Neutrophils Relative %: 26 %
Platelets: 128 10*3/uL — ABNORMAL LOW (ref 150–400)
RBC: 3.25 MIL/uL — ABNORMAL LOW (ref 4.22–5.81)
RDW: 15.5 % (ref 11.5–15.5)
WBC: 9.2 10*3/uL (ref 4.0–10.5)
nRBC: 0.2 % (ref 0.0–0.2)

## 2021-01-17 LAB — GLUCOSE, CAPILLARY
Glucose-Capillary: 132 mg/dL — ABNORMAL HIGH (ref 70–99)
Glucose-Capillary: 156 mg/dL — ABNORMAL HIGH (ref 70–99)
Glucose-Capillary: 156 mg/dL — ABNORMAL HIGH (ref 70–99)
Glucose-Capillary: 158 mg/dL — ABNORMAL HIGH (ref 70–99)
Glucose-Capillary: 161 mg/dL — ABNORMAL HIGH (ref 70–99)
Glucose-Capillary: 193 mg/dL — ABNORMAL HIGH (ref 70–99)
Glucose-Capillary: 167 mg/dL — ABNORMAL HIGH (ref 70–99)

## 2021-01-17 LAB — COMPREHENSIVE METABOLIC PANEL
ALT: 74 U/L — ABNORMAL HIGH (ref 0–44)
AST: 96 U/L — ABNORMAL HIGH (ref 15–41)
Albumin: 1.7 g/dL — ABNORMAL LOW (ref 3.5–5.0)
Alkaline Phosphatase: 49 U/L (ref 38–126)
Anion gap: 7 (ref 5–15)
BUN: 23 mg/dL (ref 8–23)
CO2: 23 mmol/L (ref 22–32)
Calcium: 7.2 mg/dL — ABNORMAL LOW (ref 8.9–10.3)
Chloride: 107 mmol/L (ref 98–111)
Creatinine, Ser: 0.77 mg/dL (ref 0.61–1.24)
GFR, Estimated: >60 mL/min (ref >60)
Glucose, Bld: 150 mg/dL — ABNORMAL HIGH (ref 70–99)
Potassium: 3.9 mmol/L (ref 3.5–5.1)
Sodium: 137 mmol/L (ref 135–145)
Total Bilirubin: 0.4 mg/dL (ref 0.3–1.2)
Total Protein: 4.4 g/dL — ABNORMAL LOW (ref 6.5–8.1)

## 2021-01-17 LAB — PHOSPHORUS: Phosphorus: 2.9 mg/dL (ref 2.5–4.6)

## 2021-01-17 LAB — CULTURE, BLOOD (SINGLE)
Culture: NO GROWTH
Special Requests: ADEQUATE

## 2021-01-17 LAB — CMV IGM: CMV IgM: <30 AU/mL (ref 0.0–29.9)

## 2021-01-17 LAB — MAGNESIUM: Magnesium: 2.2 mg/dL (ref 1.7–2.4)

## 2021-01-17 MED ORDER — ACYCLOVIR SODIUM 50 MG/ML IV SOLN
1000.0000 mg | Freq: Three times a day (TID) | INTRAVENOUS | Status: DC
  Filled 2021-01-17: qty 20

## 2021-01-17 MED ORDER — SODIUM CHLORIDE 0.9 % IV SOLN: INTRAVENOUS | Status: DC

## 2021-01-17 MED ORDER — ONDANSETRON HCL 4 MG/2ML IJ SOLN
4.0000 mg | Freq: Three times a day (TID) | INTRAMUSCULAR | Status: DC | PRN
  Administered 2021-01-17: 4 mg via INTRAVENOUS
  Filled 2021-01-17: qty 2

## 2021-01-17 MED ORDER — FUROSEMIDE 10 MG/ML IJ SOLN
40.0000 mg | Freq: Every day | INTRAMUSCULAR | Status: DC
  Administered 2021-01-17 – 2021-01-21 (×5): 40 mg via INTRAVENOUS
  Filled 2021-01-17 (×5): qty 4

## 2021-01-17 MED ORDER — HYOSCYAMINE SULFATE 0.125 MG/ML PO SOLN
0.2500 mg | Freq: Four times a day (QID) | ORAL | Status: DC | PRN
  Filled 2021-01-17: qty 2

## 2021-01-17 MED ORDER — ALBUMIN HUMAN 25 % IV SOLN
25.0000 g | Freq: Four times a day (QID) | INTRAVENOUS | Status: AC
  Administered 2021-01-17 – 2021-01-19 (×8): 25 g via INTRAVENOUS
  Filled 2021-01-17 (×8): qty 100

## 2021-01-17 MED ORDER — LIDOCAINE 5 % EX PTCH
1.0000 | MEDICATED_PATCH | CUTANEOUS | Status: DC
  Administered 2021-01-18 – 2021-01-29 (×12): 1 via TRANSDERMAL
  Filled 2021-01-17 (×14): qty 1

## 2021-01-17 MED ORDER — DEXTROSE 5 % IV SOLN
1000.0000 mg | Freq: Three times a day (TID) | INTRAVENOUS | Status: DC
  Administered 2021-01-17 – 2021-01-22 (×16): 1000 mg via INTRAVENOUS
  Filled 2021-01-17 (×17): qty 20

## 2021-01-17 NOTE — Progress Notes (Signed)
[facility]    Date of Admission:  01/12/2021   Total days of antibiotics 6  ID: Joseph Banks is a 75 y.o. male with  FUO, encephalopathy Principal Problem:   Severe sepsis ([place]) Active Problems:   Acute lower UTI   Diabetes mellitus type 2 in nonobese (HCC)   Elevated LFTs   AKI (acute kidney injury) ([place])   Atrial fibrillation with RVR (HCC)   BPH (benign prostatic hyperplasia)   Thrombocytopenia (HCC)   Dysphagia    Subjective: Underwent LP-CSF- shows mild lymphocytic predominance pleocytosis; afebrile since yesterday morning-- > 24hrs. Mentation improving but still having back pain not answering all questions  Medications:   Chlorhexidine Gluconate Cloth  6 each Topical Q0600   finasteride  5 mg Oral Daily   free water  100 mL Per Tube Q4H   gabapentin  600 mg Per Tube BID   insulin aspart  0-6 Units Subcutaneous Q4H   sodium chloride flush  10-40 mL Intracatheter Q12H    Objective: Vital signs in last 24 hours: Temp:  [98.1 F (36.7 C)-99 F (37.2 C)] 98.1 F (36.7 C) (07/09 1200) Pulse Rate:  [73-119] 83 (07/09 1300) Resp:  [17-29] 21 (07/09 1300) BP: (105-153)/(53-94) 108/87 (07/09 1300) SpO2:  [92 %-99 %] 95 % (07/09 1300) Weight:  [111.9 kg] 111.9 kg (07/09 0500) Physical Exam  Constitutional: He is oriented to person, place, only. He appears well-developed and well-nourished. mild distress.  HENT:  Mouth/Throat: Oropharynx is clear and moist. No oropharyngeal exudate.  Cardiovascular: Normal rate, regular rhythm and normal heart sounds. Exam reveals no gallop and no friction rub.  No murmur heard.  Pulmonary/Chest: Effort normal and breath sounds normal. No respiratory distress. He has no wheezes.  Abdominal: Soft. Bowel sounds are normal. He exhibits no distension. There is no tenderness.  Lymphadenopathy:  He has no cervical adenopathy.  Neurological: He is alert and oriented to person, place, and time.  Skin: Skin is  warm and dry. No rash noted. No erythema.  Ext: edema of hands Psychiatric: He has a normal mood and affect. His behavior is normal.    Lab Results Recent Labs    01/16/21 0422 01/17/21 0500  WBC 8.2 9.2  HGB 11.2* 10.0*  HCT 33.6* 31.0*  NA 137 137  K 4.1 3.9  CL 109 107  CO2 25 23  BUN 26* 23  CREATININE 0.82 0.77   Liver Panel Recent Labs    01/16/21 0422 01/17/21 0500  PROT 4.8* 4.4*  ALBUMIN 1.8* 1.7*  AST 90* 96*  ALT 73* 74*  ALKPHOS 54 49  BILITOT 0.6 0.4   Sedimentation Rate Recent Labs    01/16/21 1800  ESRSEDRATE 3   C-Reactive Protein Recent Labs    01/16/21 1800  CRP 1.3*    Microbiology: reviewed Studies/Results: CT HEAD WO CONTRAST  Result Date: 01/15/2021 CLINICAL DATA:  Fever. EXAM: CT HEAD WITHOUT CONTRAST TECHNIQUE: Contiguous axial images were obtained from the base of the skull through the vertex without intravenous contrast. COMPARISON:  None. FINDINGS: Brain: Patient had difficulty tolerating the exam there is moderate motion artifact allowing for motion, no intracranial hemorrhage. No mass effect or midline shift. No hydrocephalus. The basilar cisterns are patent. No evidence of territorial infarct or acute ischemia. No extra-axial or intracranial fluid collection. Vascular: No hyperdense vessel. Mild carotid skull base atherosclerosis. Skull: No fracture or focal lesion. Sinuses/Orbits: Postsurgical change of the right globe. No acute findings. Other: None.  IMPRESSION: Motion limited exam. No acute intracranial abnormality. Electronically Signed   By: Keith Rake M.D.   On: 01/15/2021 15:42   CT CHEST ABDOMEN PELVIS W CONTRAST  Result Date: 01/15/2021 CLINICAL DATA:  Persistent fevers.  Evaluate for infectious source. EXAM: CT CHEST, ABDOMEN, AND PELVIS WITH CONTRAST TECHNIQUE: Multidetector CT imaging of the chest, abdomen and pelvis was performed following the standard protocol during bolus administration of intravenous contrast.  CONTRAST:  125mL OMNIPAQUE IOHEXOL 300 MG/ML  SOLN COMPARISON:  MRI pelvis 01/15/2021 and CT scan 01/12/2021 FINDINGS: CT CHEST FINDINGS Cardiovascular: The heart is normal in size. No pericardial effusion. The aorta is normal in caliber. No dissection. Scattered coronary artery calcifications. Mediastinum/Nodes: No mediastinal or hilar mass or lymphadenopathy. There is an NG tube coursing down the esophagus and into the stomach. Lungs/Pleura: Small to moderate-sized bilateral pleural effusions with overlying atelectasis. No pulmonary infiltrates or pulmonary edema. No worrisome pulmonary lesions. Few small perifissural nodules are likely benign lymph nodes. Musculoskeletal: No significant bony findings. CT ABDOMEN PELVIS FINDINGS Hepatobiliary: No hepatic lesions or intrahepatic biliary dilatation. The gallbladder is grossly normal. No common bile duct dilatation. Pancreas: No mass, inflammation or ductal dilatation. Spleen: Normal size.  No focal lesions. Adrenals/Urinary Tract: The adrenal glands and kidneys are unremarkable. No renal lesions or obstructing ureteral calculi. No bladder mass is identified. There is a Foley catheter in the bladder and some residual hematoma. Stomach/Bowel: The stomach, duodenum, small bowel and colon are unremarkable. No acute inflammatory changes, mass lesions or obstructive findings. Vascular/Lymphatic: Stable aortic and iliac artery calcifications. No aneurysm. The major venous structures are patent. No mesenteric or retroperitoneal mass or adenopathy. Small scattered lymph nodes are stable. Reproductive: Significant artifact from the right hip prosthesis. The prostate gland is markedly enlarged but no obvious abscess. Other: Small amount of free abdominal and free pelvic fluid is noted but no rim enhancing abscess. Body wall edema is noted mainly in the flank areas. 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 IMPRESSION: 1. Small to moderate-sized bilateral pleural effusions with overlying atelectasis. 2. Small amount of free abdominal and free pelvic fluid but no rim enhancing abscess. 3. Markedly enlarged prostate gland but no obvious abscess. 4. Foley catheter in the bladder with some residual hematoma. Aortic Atherosclerosis (ICD10-I70.0).  Foot Electronically Signed   By: Marijo Sanes M.D.   On: 01/15/2021 15:53   IR Fluoro Guide Ndl Plmt / BX  Result Date: 01/16/2021 CLINICAL DATA:  Mental status change EXAM: DIAGNOSTIC LUMBAR PUNCTURE UNDER FLUOROSCOPIC GUIDANCE FLUOROSCOPY TIME:  6 seconds; 1 mGy PROCEDURE: Informed consent was obtained from the family prior to the procedure, including potential complications of headache, allergy, and pain. Intravenous Fentanyl 88mcg, Benadryl 25 mg, and Versed 2mg  were administered as conscious sedation during continuous monitoring of the patient's level of consciousness and physiological / cardiorespiratory status by the radiology RN, with a total moderate sedation time of 10 minutes. With the patient prone, the lower back was prepped with Betadine. 1% Lidocaine was used for local anesthesia. Lumbar puncture was performed at the L4 level from a left parasagittal approach using a 20 gauge needle with return of pale yellow tinged CSF. 10 ml of CSF were obtained for laboratory studies, divided among for sequentially numbered vials. The patient tolerated the procedure well and there were no apparent complications. IMPRESSION: 1. Technically successful lumbar puncture under fluoroscopy Electronically Signed   By: Lucrezia Europe  M.D.   On: 01/16/2021 17:05   DG Chest Port 1 View  Result Date: 01/15/2021 CLINICAL DATA:  PICC line placement EXAM: PORTABLE CHEST 1 VIEW COMPARISON:  01/14/2021, CT 01/15/2021 FINDINGS: Esophageal tube tip extends below diaphragm but is incompletely visualized. Right upper extremity central venous catheter tip  faintly visible over the SVC. Bilateral pleural effusions. Slight worsening of consolidation at left lung base. Cardiomegaly with vascular congestion. IMPRESSION: 1. Right upper extremity central venous catheter tip overlies the SVC. 2. Cardiomegaly with vascular congestion. Bilateral pleural effusions with slight worsening of consolidation at left lung base Electronically Signed   By: Donavan Foil M.D.   On: 01/15/2021 18:51     Assessment/Plan: Aseptic/viral meningitis = unlikely to be partially treated bacterial meningitis since would expect more neutrophils on LP. Will discontinue ceftriaxone and ampicillin today. Interesting fever improved before antiviral started this morning. Will follow up on cultures and HSV PCR. Continue on doxycycline for now  Encephalopathy = mildly improved  Back pain = consider lidocaine patch to lumbar region, less sedating  Complicated uti = initially treated has now completed course.    [facility] Cell: [phone] Pager: [phone]  01/17/2021, 2:13 PM

## 2021-01-17 NOTE — Assessment & Plan Note (Addendum)
-   continues to improve  - most likely due to acute illness/sepsis

## 2021-01-17 NOTE — Progress Notes (Signed)
Patient improving.  Much more interactive today and appropriate.  Foley catheter draining well with dark urine.   Intake/Output Summary (Last 24 hours) at 01/17/2021 1251 Last data filed at 01/17/2021 1100 Gross per 24 hour  Intake 1704.51 ml  Output 1785 ml  Net -80.49 ml   Lab Results  Component Value Date   CREATININE 0.77 01/17/2021   CREATININE 0.82 01/16/2021   CREATININE 1.05 01/14/2021     A/P -   Sepsis/fever -Per wife and daughter, sounds like a "viral meningitis".  Patient improving.  No prostate abscess on MRI.   BPH-continue tamsulosin and finasteride.  Continue Foley catheter.  Voiding trial when he is ambulatory.   Gross hematuria-consider outpatient cystoscopy.  I will notify Dr. Diona Fanti of patient's improvement in clinical status.  Please page GU with any questions, concerns or changes in patient's status.

## 2021-01-17 NOTE — Progress Notes (Signed)
Pharmacy Antibiotic Note  Joseph Banks is a 75 y.o. male admitted on 01/12/2021 with fevers of unknown origin, recently treated PTA for prostatis. Pharmacy initially consulted for vancomycin and meropenem dosing, which was converted to ampicillin/CTX/doxy per ID to cover for CNS + tickborne illness. Today, Pharmacy consulted to add acyclovir for broader CNS coverage.  Plan: Continue ampicillin, ceftriaxone (2g q12 hr), doxycycline as ordered Start acyclovir 10 mg/kg IV q8 hr Per Pharmacy protocol, hydration with NS at 50 ml/hr will be initiated (fluid restricted per MD) Medications reviewed for other nephrotoxic medications; none ordered at this time   Height: 6\' 3"  (190.5 cm) Weight: 111.9 kg (246 lb 11.1 oz) IBW/kg (Calculated) : 84.5  Temp (24hrs), Avg:98.5 F (36.9 C), Min:98.4 F (36.9 C), Max:98.8 F (37.1 C)  Recent Labs  Lab 01/12/21 1257 01/12/21 1506 01/12/21 1807 01/12/21 1943 01/12/21 1944 01/13/21 0245 01/13/21 0849 01/13/21 1552 01/14/21 0227 01/16/21 0422 01/17/21 0500  WBC 6.0  --   --   --   --  7.7  --   --  7.1 8.2 9.2  CREATININE 1.26*  --   --   --    < > 1.08 1.04  --  1.05 0.82 0.77  LATICACIDVEN 2.0* 3.2* 3.0* 2.3*  --   --  2.5* 2.9*  --   --   --    < > = values in this interval not displayed.    Estimated Creatinine Clearance: 109.4 mL/min (by C-G formula based on SCr of 0.77 mg/dL).    No Known Allergies  Antimicrobials this admission: 7/4 Vanc + Merrem >> 7/7 7/7 ampicillin >>  7/7 ceftriaxone 2q12 >>  7/7 doxycycline >> 7/9 acyclovir >>   Dose adjustments this admission: n/a  Microbiology results: 6/16 UCx (PTA): >100k E coli pansens 7/4 MRSA PCR: neg 7/4 UCx (I/O cath): 1k colonies MSSE 7/4 BCx (x1): NGF 7/6 BCx 1set: ngtd 7/8 CSF Cx: ngtd, no orgs on Gm stain - fungus: IP - HSV: IP   Thank you for allowing pharmacy to be a part of this patient's care.  Heinrich Fertig A 01/17/2021 7:50 AM

## 2021-01-17 NOTE — Progress Notes (Signed)
Progress Note    Joseph Banks   RJJ:[id number]  DOB: Nov 29, 1945  DOA: 01/12/2021     5  PCP: Glenda Chroman, MD  CC: perianal pain  Hospital Course: Joseph Banks is a 75 y.o. male with history of diabetes mellitus who has been experiencing perianal pain and had been to Dr. Jeffie Pollock urologist on 12/25/20; at that time was diagnosed with prostatitis and epididymitis was given 1 shot of ceftriaxone and was placed on Bactrim.  On 01/09/32 patient went to the ER at [facility] with complaints of worsening pain and fever 104.  At the time patient had a CT scan and also was placed on ciprofloxacin which he was on for 4 days PTA but having worsening fever.  He also began developing more difficulty urinating with darkening urine.   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  He was started on empiric antibiotics. Furthermore, he also developed A. fib with RVR which required initial rate control with Cardizem which was able to be transitioned to PRN lopressor.  He continued to remain encephalopathic with intermittent fevers.  Further work-up was commenced. See below for further problem based plan.   Interval History:  T-max in the past 24 hours has been 99 F.  Wife bedside this morning states that he seems a little bit more alert.  During my exam this was minimal but he did appear a little more comfortable.  LP was successful yesterday.  See discussion below. Today, he was able to follow some of my commands for the first time it seemed.  ROS: Review of systems not obtained due to patient factors. Cognitive impairment   Assessment & Plan: * Severe sepsis (HCC) - Fever, tachycardia, lactic acidosis.  Source presumed prostatitis on admission; after no significant improvement and no prostatic abscess noted on repeat MRI pelvis on 01/15/2021, further work-up was pursued - s/p foley placement by urology on admission -Initially vanc/mero.  Changed to CTX, amp, doxy on 7/7. ID also consulted at that time -Fevers persisted intermittently - blood cultures remain negative from 7/4 and 7/6 - PICC placed 7/7 - differential at this time includes prostatic abscess (ruled out on MRI on 7/7) vs other unidentified infection such as CNS (concerning some about recent trip to [facility] as well as persistent fevers, and rigid extremities/head/neck) - CT H/C/A/P also unrevealing but some motion artifact but grossly normal - underwent LP on 7/8 under conscious sedation  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 Tube 1: WBC 32, seg neut 7%, Lymphs 91%, 1% eos Protein: 128 mg/dL, Glucose: 68 mg/dL - viral studies still pending - diagnosis consistent given persistent AMS, photophobia, nuchal rigidity, ongoing agitation and CSF consistent with viral etiology - start acyclovir for now  - follow up pending studies - will discuss further with ID as well   Dysphagia - SLP eval: Dysphagia level 3 diet ordered but too encephalopathic - NGT placed on 7/6 and TF started   BPH (benign prostatic hyperplasia) - continue flomax and finasteride per urology - continue foley per urology   Atrial fibrillation with RVR ([place]) - likely precipitated by sepsis - TSH normal - use PRN lopressor  AKI (acute kidney injury) ([place]) - presumed from outlet obstruction; now s/p foley placed -Renal function has normalized - BMP daily - given UE edema and net pos ~ 8L will start to diurese  some with IV lasix. Noted he is also on acyclovir and NS currently - albumin also very low, will use IV alb while diuresing  Thrombocytopenia (HCC) - For now presumed due to acute illness but see further workup above - Trend CBC - starting to improve   Elevated LFTs - slowly improving - may have been due to acute illness/sepsis  Diabetes mellitus type 2 in nonobese (HCC) - continue SSI and CBG monitoring   Old records  reviewed in assessment of this patient  Antimicrobials: Vanc 7/4 >> 7/7 Meropenem 7/4 >> 7/7 Doxy 7/7 >> current  Rocehin 7/7 >> current Ampicillin 7/7 >> current  Acyclovir 7/9 >> current   DVT prophylaxis: SCDs Start: 01/12/21 1736   Code Status:   Code Status: Full Code Family Communication: wife  Disposition Plan: Status is: Inpatient  Remains inpatient appropriate because:IV treatments appropriate due to intensity of illness or inability to take PO and Inpatient level of care appropriate due to severity of illness  Dispo: The patient is from: Home              Anticipated d/c is to:  pending PT eval              Patient currently is not medically stable to d/c.   Difficult to place patient No  Risk of unplanned readmission score: Unplanned Admission- Pilot do not use: 13.29   Objective: Blood pressure 108/87, pulse 83, temperature 98.1 F (36.7 C), temperature source Oral, resp. rate (!) 21, height 6\' 3"  (1.905 m), weight 111.9 kg, SpO2 95 %.  Examination: General appearance:  Little more comfortable appearing in bed today and able to follow some commands.  Less agitation noted Head: Normocephalic, without obvious abnormality, atraumatic Neck: Will not allow me to passively move neck sideways or flexion Eyes:  Right eye pupil irregular which is chronic/baseline ; will fight to keep eyes closed when trying to open them Lungs: clear to auscultation bilaterally Heart: irregularly irregular rhythm, S1, S2 normal, and tachycardic Abdomen: normal findings: bowel sounds normal and soft, non-tender Extremities:  Edema noted in bilateral upper extremities Skin: mobility and turgor normal Neurologic: Squeezed my fingers with bilateral hands  Consultants:  Urology Pulmonary/critical care - signed off ID IR  Procedures:    Data Reviewed: I have personally reviewed following labs and imaging studies Results for orders placed or performed during the hospital encounter of  01/12/21 (from the past 24 hour(s))  CSF cell count with differential collection tube #: 1     Status: Abnormal   Collection Time: 01/16/21  4:30 PM  Result Value Ref Range   Tube # 4    Color, CSF COLORLESS COLORLESS   Appearance, CSF CLEAR CLEAR   Supernatant NOT INDICATED    RBC Count, CSF 6 (H) 0 /cu mm   WBC, CSF 43 (HH) 0 - 5 /cu mm   Segmented Neutrophils-CSF 10 (H) 0 - 6 %   Lymphs, CSF 88 (H) 40 - 80 %   Monocyte-Macrophage-Spinal Fluid 2 (L) 15 - 45 %   Eosinophils, CSF 0 0 - 1 %  CSF cell count with differential     Status: Abnormal   Collection Time: 01/16/21  4:30 PM  Result Value Ref Range   Tube # 1    Color, CSF COLORLESS COLORLESS   Appearance, CSF CLEAR CLEAR   Supernatant NOT INDICATED    RBC Count, CSF 4 (H) 0 /cu mm   WBC, CSF 32 (HH) 0 - 5 /  cu mm   Segmented Neutrophils-CSF 7 (H) 0 - 6 %   Lymphs, CSF 91 (H) 40 - 80 %   Monocyte-Macrophage-Spinal Fluid 1 (L) 15 - 45 %   Eosinophils, CSF 1 0 - 1 %  CSF culture w Gram Stain     Status: None (Preliminary result)   Collection Time: 01/16/21  4:30 PM   Specimen: Cerebrospinal Fluid  Result Value Ref Range   Specimen Description LUMBAR    Special Requests NONE    Gram Stain      WBC PRESENT, PREDOMINANTLY MONONUCLEAR NO ORGANISMS SEEN CYTOSPIN SMEAR Gram Stain Report Called to,Read Back By and Verified WithKathleen Argue, RN AT 2153 ON 07.08.22 BY N.THOMPSON Performed at [facility] [address], [place], [place] [address]    Culture PENDING    Report Status PENDING   Protein and glucose, CSF     Status: Abnormal   Collection Time: 01/16/21  4:30 PM  Result Value Ref Range   Glucose, CSF 68 40 - 70 mg/dL   Total  Protein, CSF 128 (H) 15 - 45 mg/dL  Culture, fungus without smear     Status: None (Preliminary result)   Collection Time: 01/16/21  4:30 PM   Specimen: Cerebrospinal Fluid  Result Value Ref Range   Specimen Description      LUMBAR Performed at [facility] [address], [place], [place] [address]    Special Requests      NONE Performed at [facility], [place] [address] [place], [place] [address]    Culture      NO FUNGUS ISOLATED AFTER 10 DAYS Performed at [facility], [place] [address], [place], [place] [address]    Report Status PENDING   Glucose, capillary     Status: Abnormal   Collection Time: 01/16/21  5:45 PM  Result Value Ref Range   Glucose-Capillary 147 (H) 70 - 99 mg/dL  Sedimentation rate     Status: None   Collection Time: 01/16/21  6:00 PM  Result Value Ref Range   Sed Rate 3 0 - 16 mm/hr  C-reactive protein     Status: Abnormal   Collection Time: 01/16/21  6:00 PM  Result Value Ref Range   CRP 1.3 (H) <1.0 mg/dL  CMV IgM     Status: None   Collection Time: 01/16/21  6:00 PM  Result Value Ref Range   CMV IgM <30.0 0.0 - 29.9 AU/mL  Ferritin     Status: Abnormal   Collection Time: 01/16/21  6:00 PM  Result Value Ref Range   Ferritin 1,459 (H) 24 - 336 ng/mL  Glucose, capillary     Status: Abnormal   Collection Time: 01/16/21  7:44 PM  Result Value Ref Range   Glucose-Capillary 135 (H) 70 - 99 mg/dL  Glucose, capillary     Status: Abnormal   Collection Time: 01/16/21 11:12 PM  Result Value Ref Range   Glucose-Capillary 132 (H) 70 - 99 mg/dL  Glucose, capillary     Status: Abnormal   Collection Time: 01/17/21  3:45 AM  Result Value Ref Range   Glucose-Capillary 156 (H) 70 - 99 mg/dL  Comprehensive metabolic panel     Status: Abnormal   Collection Time: 01/17/21  5:00 AM  Result Value Ref Range   Sodium 137 135 - 145 mmol/L   Potassium 3.9 3.5 - 5.1 mmol/L   Chloride 107 98 - 111 mmol/L   CO2 23  22 - 32 mmol/L   Glucose, Bld 150 (H) 70 - 99 mg/dL   BUN 23 8 - 23 mg/dL   Creatinine, Ser 0.77 0.61 - 1.24 mg/dL   Calcium 7.2 (L) 8.9 - 10.3 mg/dL   Total Protein 4.4 (L) 6.5 - 8.1 g/dL   Albumin 1.7 (L) 3.5 - 5.0 g/dL   AST 96 (H) 15 - 41 U/L   ALT 74 (H) 0 - 44 U/L    Alkaline Phosphatase 49 38 - 126 U/L   Total Bilirubin 0.4 0.3 - 1.2 mg/dL   GFR, Estimated >60 >60 mL/min   Anion gap 7 5 - 15  CBC with Differential/Platelet     Status: Abnormal   Collection Time: 01/17/21  5:00 AM  Result Value Ref Range   WBC 9.2 4.0 - 10.5 K/uL   RBC 3.25 (L) 4.22 - 5.81 MIL/uL   Hemoglobin 10.0 (L) 13.0 - 17.0 g/dL   HCT 31.0 (L) 39.0 - 52.0 %   MCV 95.4 80.0 - 100.0 fL   MCH 30.8 26.0 - 34.0 pg   MCHC 32.3 30.0 - 36.0 g/dL   RDW 15.5 11.5 - 15.5 %   Platelets 128 (L) 150 - 400 K/uL   nRBC 0.2 0.0 - 0.2 %   Neutrophils Relative % 26 %   Neutro Abs 2.4 1.7 - 7.7 K/uL   Lymphocytes Relative 67 %   Lymphs Abs 6.2 (H) 0.7 - 4.0 K/uL   Monocytes Relative 5 %   Monocytes Absolute 0.4 0.1 - 1.0 K/uL   Eosinophils Relative 1 %   Eosinophils Absolute 0.1 0.0 - 0.5 K/uL   Basophils Relative 1 %   Basophils Absolute 0.1 0.0 - 0.1 K/uL   Immature Granulocytes 0 %   Abs Immature Granulocytes 0.03 0.00 - 0.07 K/uL  Magnesium     Status: None   Collection Time: 01/17/21  5:00 AM  Result Value Ref Range   Magnesium 2.2 1.7 - 2.4 mg/dL  Phosphorus     Status: None   Collection Time: 01/17/21  5:00 AM  Result Value Ref Range   Phosphorus 2.9 2.5 - 4.6 mg/dL  Glucose, capillary     Status: Abnormal   Collection Time: 01/17/21  7:44 AM  Result Value Ref Range   Glucose-Capillary 156 (H) 70 - 99 mg/dL  Glucose, capillary     Status: Abnormal   Collection Time: 01/17/21 11:58 AM  Result Value Ref Range   Glucose-Capillary 158 (H) 70 - 99 mg/dL    Recent Results (from the past 240 hour(s))  Blood culture (routine single)     Status: None   Collection Time: 01/12/21 12:57 PM   Specimen: Right Antecubital; Blood  Result Value Ref Range Status   Specimen Description   Final    RIGHT ANTECUBITAL BOTTLES DRAWN AEROBIC AND ANAEROBIC   Special Requests Blood Culture adequate volume  Final   Culture   Final    NO GROWTH 5 DAYS Performed at [facility], [address] [place], [place] [address]    Report Status 01/17/2021 FINAL  Final  Urine culture     Status: Abnormal   Collection Time: 01/12/21  1:08 PM   Specimen: In/Out Cath Urine  Result Value Ref Range Status   Specimen Description   Final    IN/OUT CATH URINE Performed at [facility], [address], [place], [place] [address]    Special Requests   Final    NONE Performed at [facility], [address], [place], [place] [address]    Culture 1,000 COLONIES/mL STAPHYLOCOCCUS EPIDERMIDIS (A)  Final   Report Status 01/15/2021 FINAL  Final   Organism ID, Bacteria STAPHYLOCOCCUS EPIDERMIDIS (A)  Final      Susceptibility   Staphylococcus epidermidis - MIC*    CIPROFLOXACIN >=8 RESISTANT Resistant     GENTAMICIN <=0.5 SENSITIVE Sensitive     NITROFURANTOIN <=16 SENSITIVE Sensitive     OXACILLIN <=0.25 SENSITIVE Sensitive     TETRACYCLINE 2 SENSITIVE Sensitive     VANCOMYCIN 2 SENSITIVE Sensitive     TRIMETH/SULFA >=320 RESISTANT Resistant     CLINDAMYCIN <=0.25 SENSITIVE Sensitive     RIFAMPIN <=0.5 SENSITIVE Sensitive     Inducible Clindamycin NEGATIVE Sensitive     * 1,000 COLONIES/mL STAPHYLOCOCCUS EPIDERMIDIS  Resp Panel by RT-PCR (Flu A&B, Covid) Nasopharyngeal Swab     Status: None   Collection Time: 01/12/21  1:09 PM   Specimen: Nasopharyngeal Swab; Nasopharyngeal(NP) swabs in vial transport medium  Result Value Ref Range Status   SARS Coronavirus 2 by RT PCR NEGATIVE NEGATIVE Final    Comment: (NOTE) SARS-CoV-2 target nucleic acids are NOT DETECTED.  The SARS-CoV-2 RNA is generally detectable in upper respiratory specimens during the acute phase of infection. The lowest concentration of SARS-CoV-2 viral copies this assay can detect is 138 copies/mL. A negative result does not preclude SARS-Cov-2 infection and should not be used as the sole basis for treatment or other patient management decisions. A negative result may occur with  improper specimen collection/handling,  submission of specimen other than nasopharyngeal swab, presence of viral mutation(s) within the areas targeted by this assay, and inadequate number of viral copies(<138 copies/mL). A negative result must be combined with clinical observations, patient history, and epidemiological information. The expected result is Negative.  Fact Sheet for Patients:  EntrepreneurPulse.com.au  Fact Sheet for Healthcare Providers:  IncredibleEmployment.be  This test is no t yet approved or cleared by the [place] FDA and  has been authorized for detection and/or diagnosis of SARS-CoV-2 by FDA under an Emergency Use Authorization (EUA). This EUA will remain  in effect (meaning this test can be used) for the duration of the COVID-19 declaration under Section 564(b)(1) of the Act, 21 U.S.C.section 360bbb-3(b)(1), unless the authorization is terminated  or revoked sooner.       Influenza A by PCR NEGATIVE NEGATIVE Final   Influenza B by PCR NEGATIVE NEGATIVE Final    Comment: (NOTE) The Xpert Xpress SARS-CoV-2/FLU/RSV plus assay is intended as an aid in the diagnosis of influenza from Nasopharyngeal swab specimens and should not be used as a sole basis for treatment. Nasal washings and aspirates are unacceptable for Xpert Xpress SARS-CoV-2/FLU/RSV testing.  Fact Sheet for Patients: EntrepreneurPulse.com.au  Fact Sheet for Healthcare Providers: IncredibleEmployment.be  This test is not yet approved or cleared by the [place] FDA and has been authorized for detection and/or diagnosis of SARS-CoV-2 by FDA under an Emergency Use Authorization (EUA). This EUA will remain in effect (meaning this test can be used) for the duration of the COVID-19 declaration under Section 564(b)(1) of the Act, 21 U.S.C. section 360bbb-3(b)(1), unless the authorization is terminated or revoked.  Performed at [facility], [address], [place], [place] [address]   MRSA Next Gen by PCR, Nasal     Status: None   Collection Time: 01/12/21  7:30 PM  Result Value Ref Range Status   MRSA by PCR Next Gen NOT DETECTED NOT DETECTED Final  Comment: (NOTE) The GeneXpert MRSA Assay (FDA approved for NASAL specimens only), is one component of a comprehensive MRSA colonization surveillance program. It is not intended to diagnose MRSA infection nor to guide or monitor treatment for MRSA infections. Test performance is not FDA approved in patients less than 43 years old. Performed at [facility], [place] [address] [place], [place] [address]   Culture, blood (single)     Status: None (Preliminary result)   Collection Time: 01/14/21  8:50 AM   Specimen: BLOOD LEFT HAND  Result Value Ref Range Status   Specimen Description   Final    BLOOD LEFT HAND Performed at [facility] [address] [place], [place] [address]    Special Requests   Final    BOTTLES DRAWN AEROBIC ONLY Blood Culture adequate volume Performed at [facility] [address], [place], [place] [address]    Culture   Final    NO GROWTH 3 DAYS Performed at [facility], [place] [address], [place], [place] [address]    Report Status PENDING  Incomplete  CSF culture w Gram Stain     Status: None (Preliminary result)   Collection Time: 01/16/21  4:30 PM   Specimen: Cerebrospinal Fluid  Result Value Ref Range Status   Specimen Description LUMBAR  Final   Special Requests NONE  Final   Gram Stain   Final    WBC PRESENT, PREDOMINANTLY MONONUCLEAR NO ORGANISMS SEEN CYTOSPIN SMEAR Gram Stain Report Called to,Read Back By and Verified WithKathleen Argue, RN AT 2153 ON 07.08.22 BY N.THOMPSON Performed at [facility], [place] [address] [place], [place] [address]    Culture PENDING  Incomplete   Report Status PENDING  Incomplete  Culture, fungus without smear     Status: None (Preliminary  result)   Collection Time: 01/16/21  4:30 PM   Specimen: Cerebrospinal Fluid  Result Value Ref Range Status   Specimen Description   Final    LUMBAR Performed at [facility] [address] [place], [place] [address]    Special Requests   Final    NONE Performed at [facility], [place] [address] [place], [place] [address]    Culture   Final    NO FUNGUS ISOLATED AFTER 10 DAYS Performed at [facility], [place] [address] [place], [place] [address]    Report Status PENDING  Incomplete     Radiology Studies: CT HEAD WO CONTRAST  Result Date: 01/15/2021 CLINICAL DATA:  Fever. EXAM: CT HEAD WITHOUT CONTRAST TECHNIQUE: Contiguous axial images were obtained from the base of the skull through the vertex without intravenous contrast. COMPARISON:  None. FINDINGS: Brain: Patient had difficulty tolerating the exam there is moderate motion artifact allowing for motion, no intracranial hemorrhage. No mass effect or midline shift. No hydrocephalus. The basilar cisterns are patent. No evidence of territorial infarct or acute ischemia. No extra-axial or intracranial fluid collection. Vascular: No hyperdense vessel. Mild carotid skull base atherosclerosis. Skull: No fracture or focal lesion. Sinuses/Orbits: Postsurgical change of the right globe. No acute findings. Other: None. IMPRESSION: Motion limited exam. No acute intracranial abnormality. Electronically Signed   By: Keith Rake M.D.   On: 01/15/2021 15:42   CT CHEST ABDOMEN PELVIS W CONTRAST  Result Date: 01/15/2021 CLINICAL DATA:  Persistent fevers.  Evaluate for infectious source. EXAM: CT CHEST, ABDOMEN, AND PELVIS WITH CONTRAST TECHNIQUE: Multidetector CT imaging of the chest, abdomen and pelvis  was performed following the standard protocol during bolus administration of intravenous contrast. CONTRAST:  187mL OMNIPAQUE IOHEXOL 300 MG/ML  SOLN COMPARISON:  MRI pelvis 01/15/2021 and CT scan 01/12/2021  FINDINGS: CT CHEST FINDINGS Cardiovascular: The heart is normal in size. No pericardial effusion. The aorta is normal in caliber. No dissection. Scattered coronary artery calcifications. Mediastinum/Nodes: No mediastinal or hilar mass or lymphadenopathy. There is an NG tube coursing down the esophagus and into the stomach. Lungs/Pleura: Small to moderate-sized bilateral pleural effusions with overlying atelectasis. No pulmonary infiltrates or pulmonary edema. No worrisome pulmonary lesions. Few small perifissural nodules are likely benign lymph nodes. Musculoskeletal: No significant bony findings. CT ABDOMEN PELVIS FINDINGS Hepatobiliary: No hepatic lesions or intrahepatic biliary dilatation. The gallbladder is grossly normal. No common bile duct dilatation. Pancreas: No mass, inflammation or ductal dilatation. Spleen: Normal size.  No focal lesions. Adrenals/Urinary Tract: The adrenal glands and kidneys are unremarkable. No renal lesions or obstructing ureteral calculi. No bladder mass is identified. There is a Foley catheter in the bladder and some residual hematoma. Stomach/Bowel: The stomach, duodenum, small bowel and colon are unremarkable. No acute inflammatory changes, mass lesions or obstructive findings. Vascular/Lymphatic: Stable aortic and iliac artery calcifications. No aneurysm. The major venous structures are patent. No mesenteric or retroperitoneal mass or adenopathy. Small scattered lymph nodes are stable. Reproductive: Significant artifact from the right hip prosthesis. The prostate gland is markedly enlarged but no obvious abscess. Other: Small amount of free abdominal and free pelvic fluid is noted but no rim enhancing abscess. Body wall edema is noted mainly in the flank areas. 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 IMPRESSION: 1. Small to moderate-sized bilateral pleural effusions with  overlying atelectasis. 2. Small amount of free abdominal and free pelvic fluid but no rim enhancing abscess. 3. Markedly enlarged prostate gland but no obvious abscess. 4. Foley catheter in the bladder with some residual hematoma. Aortic Atherosclerosis (ICD10-I70.0).  Foot Electronically Signed   By: Marijo Sanes M.D.   On: 01/15/2021 15:53   IR Fluoro Guide Ndl Plmt / BX  Result Date: 01/16/2021 CLINICAL DATA:  Mental status change EXAM: DIAGNOSTIC LUMBAR PUNCTURE UNDER FLUOROSCOPIC GUIDANCE FLUOROSCOPY TIME:  6 seconds; 1 mGy PROCEDURE: Informed consent was obtained from the family prior to the procedure, including potential complications of headache, allergy, and pain. Intravenous Fentanyl 64mcg, Benadryl 25 mg, and Versed 2mg  were administered as conscious sedation during continuous monitoring of the patient's level of consciousness and physiological / cardiorespiratory status by the radiology RN, with a total moderate sedation time of 10 minutes. With the patient prone, the lower back was prepped with Betadine. 1% Lidocaine was used for local anesthesia. Lumbar puncture was performed at the L4 level from a left parasagittal approach using a 20 gauge needle with return of pale yellow tinged CSF. 10 ml of CSF were obtained for laboratory studies, divided among for sequentially numbered vials. The patient tolerated the procedure well and there were no apparent complications. IMPRESSION: 1. Technically successful lumbar puncture under fluoroscopy Electronically Signed   By: Lucrezia Europe M.D.   On: 01/16/2021 17:05   DG Chest Port 1 View  Result Date: 01/15/2021 CLINICAL DATA:  PICC line placement EXAM: PORTABLE CHEST 1 VIEW COMPARISON:  01/14/2021, CT 01/15/2021 FINDINGS: Esophageal tube tip extends below diaphragm but is incompletely visualized. Right upper extremity central venous catheter tip faintly visible over the SVC. Bilateral pleural effusions. Slight worsening of  consolidation at left lung base.  Cardiomegaly with vascular congestion. IMPRESSION: 1. Right upper extremity central venous catheter tip overlies the SVC. 2. Cardiomegaly with vascular congestion. Bilateral pleural effusions with slight worsening of consolidation at left lung base Electronically Signed   By: Donavan Foil M.D.   On: 01/15/2021 18:51   IR Fluoro Guide Ndl Plmt / BX  Final Result    DG Chest Port 1 View  Final Result    CT HEAD WO CONTRAST  Final Result    CT CHEST ABDOMEN PELVIS W CONTRAST  Final Result    MR PELVIS WO CONTRAST  Final Result    [place] EKG SITE RITE  Final Result    DG CHEST PORT 1 VIEW  Final Result    DG Abd 1 View  Final Result    CT ABDOMEN PELVIS W CONTRAST  Final Result    DG Chest Port 1 View  Final Result      Scheduled Meds:  Chlorhexidine Gluconate Cloth  6 each Topical Q0600   finasteride  5 mg Oral Daily   free water  100 mL Per Tube Q4H   furosemide  40 mg Intravenous Daily   gabapentin  600 mg Per Tube BID   insulin aspart  0-6 Units Subcutaneous Q4H   sodium chloride flush  10-40 mL Intracatheter Q12H   PRN Meds: sodium chloride, acetaminophen (TYLENOL) oral liquid 160 mg/5 mL, acetaminophen, lip balm, LORazepam, metoprolol tartrate, morphine injection, polyvinyl alcohol, sodium chloride flush Continuous Infusions:  sodium chloride 10 mL/hr at 01/17/21 1200   sodium chloride Stopped (01/17/21 1018)   acyclovir 1,000 mg (01/17/21 1409)   albumin human     doxycycline (VIBRAMYCIN) IV Stopped (01/17/21 1218)   feeding supplement (GLUCERNA 1.5 CAL) 65 mL/hr at 01/16/21 2230     LOS: 5 days  Time spent: Greater than 50% of the 35 minute visit was spent in counseling/coordination of care for the patient as laid out in the A&P.   Dwyane Dee, MD [facility] 01/17/2021, 2:58 PM

## 2021-01-17 NOTE — Progress Notes (Signed)
   Notified [facility] provider of critical WBC amount in patients CSF from the LP that was performed today. Also patient's tube feeding was not restarted until 2230 due to intolerance of 45 degree angle in bed. Plan of care was to initially treat with minimal amount of pain medication, and anxiety meds- per family request in hope that his cognitive function would continue to approve but patient has been showing signs of extreme discomfort again. PIV morphine given as prescribed with little effect but ativan was given and the patient has been sleeping soundly. Patient does appear to have a slight protrusion on the right upper quadrant of his abdomen where he is now reporting pain with movement and palpation. On call provider also notified of these findings. Bowel sounds are hyperactive and he is producing stool from rectal tube.

## 2021-01-17 NOTE — Assessment & Plan Note (Addendum)
-   s/p LP 01/16/21 - CSF studies:  Tube 4: WBC 43, seg neut 10%, Lymphs 88%. Tube 1: WBC 32, seg neut 7%, Lymphs 91%, 1% eos Protein: 128 mg/dL, Glucose: 68 mg/dL - VZV negative - viral studies still pending (HSV, enterovirus); follow up send out too - diagnosis consistent given persistent AMS, photophobia, nuchal rigidity, ongoing agitation and CSF consistent with viral etiology - continue acyclovir - last documented fever: 01/15/21 @ 2054

## 2021-01-18 DIAGNOSIS — R6520 Severe sepsis without septic shock: Secondary | ICD-10-CM | POA: Diagnosis not present

## 2021-01-18 DIAGNOSIS — A419 Sepsis, unspecified organism: Secondary | ICD-10-CM | POA: Diagnosis not present

## 2021-01-18 LAB — GLUCOSE, CAPILLARY
Glucose-Capillary: 110 mg/dL — ABNORMAL HIGH (ref 70–99)
Glucose-Capillary: 139 mg/dL — ABNORMAL HIGH (ref 70–99)
Glucose-Capillary: 185 mg/dL — ABNORMAL HIGH (ref 70–99)
Glucose-Capillary: 162 mg/dL — ABNORMAL HIGH (ref 70–99)
Glucose-Capillary: 151 mg/dL — ABNORMAL HIGH (ref 70–99)

## 2021-01-18 LAB — COMPREHENSIVE METABOLIC PANEL
ALT: 78 U/L — ABNORMAL HIGH (ref 0–44)
AST: 94 U/L — ABNORMAL HIGH (ref 15–41)
Albumin: 2.8 g/dL — ABNORMAL LOW (ref 3.5–5.0)
Alkaline Phosphatase: 42 U/L (ref 38–126)
Anion gap: 6 (ref 5–15)
BUN: 17 mg/dL (ref 8–23)
CO2: 28 mmol/L (ref 22–32)
Calcium: 7.9 mg/dL — ABNORMAL LOW (ref 8.9–10.3)
Chloride: 104 mmol/L (ref 98–111)
Creatinine, Ser: 0.75 mg/dL (ref 0.61–1.24)
GFR, Estimated: >60 mL/min (ref >60)
Glucose, Bld: 127 mg/dL — ABNORMAL HIGH (ref 70–99)
Potassium: 3.7 mmol/L (ref 3.5–5.1)
Sodium: 138 mmol/L (ref 135–145)
Total Bilirubin: 0.7 mg/dL (ref 0.3–1.2)
Total Protein: 5.5 g/dL — ABNORMAL LOW (ref 6.5–8.1)

## 2021-01-18 LAB — CBC WITH DIFFERENTIAL/PLATELET
Abs Immature Granulocytes: 0.03 10*3/uL (ref 0.00–0.07)
Basophils Absolute: 0.1 10*3/uL (ref 0.0–0.1)
Basophils Relative: 1 %
Eosinophils Absolute: 0.1 10*3/uL (ref 0.0–0.5)
Eosinophils Relative: 1 %
HCT: 27.6 % — ABNORMAL LOW (ref 39.0–52.0)
Hemoglobin: 9.1 g/dL — ABNORMAL LOW (ref 13.0–17.0)
Immature Granulocytes: 0 %
Lymphocytes Relative: 67 %
Lymphs Abs: 5.3 10*3/uL — ABNORMAL HIGH (ref 0.7–4.0)
MCH: 30.6 pg (ref 26.0–34.0)
MCHC: 33 g/dL (ref 30.0–36.0)
MCV: 92.9 fL (ref 80.0–100.0)
Monocytes Absolute: 0.6 10*3/uL (ref 0.1–1.0)
Monocytes Relative: 7 %
Neutro Abs: 2 10*3/uL (ref 1.7–7.7)
Neutrophils Relative %: 24 %
Platelets: 175 10*3/uL (ref 150–400)
RBC: 2.97 MIL/uL — ABNORMAL LOW (ref 4.22–5.81)
RDW: 15.1 % (ref 11.5–15.5)
WBC: 8 10*3/uL (ref 4.0–10.5)
nRBC: 0 % (ref 0.0–0.2)

## 2021-01-18 LAB — PHOSPHORUS: Phosphorus: 3.1 mg/dL (ref 2.5–4.6)

## 2021-01-18 LAB — MAGNESIUM: Magnesium: 2.4 mg/dL (ref 1.7–2.4)

## 2021-01-18 MED ORDER — HYOSCYAMINE SULFATE 0.125 MG/ML PO SOLN
0.2500 mg | Freq: Four times a day (QID) | ORAL | Status: DC | PRN
  Filled 2021-01-18: qty 2

## 2021-01-18 MED ORDER — ORAL CARE MOUTH RINSE
15.0000 mL | Freq: Two times a day (BID) | OROMUCOSAL | Status: DC
  Administered 2021-01-18 – 2021-01-25 (×10): 15 mL via OROMUCOSAL

## 2021-01-18 MED ORDER — TRAZODONE HCL 50 MG PO TABS: 50.0000 mg | ORAL_TABLET | Freq: Once | ORAL | Status: DC

## 2021-01-18 MED ORDER — TRAZODONE HCL 50 MG PO TABS
50.0000 mg | ORAL_TABLET | Freq: Once | ORAL | Status: AC
  Administered 2021-01-18: 50 mg
  Filled 2021-01-18: qty 1

## 2021-01-18 NOTE — Progress Notes (Addendum)
Progress Note    Joseph Banks   MQK:[id number]  DOB: October 16, 1945  DOA: 01/12/2021     6  PCP: Glenda Chroman, MD  CC: perianal pain  Hospital Course: Joseph Banks is a 75 y.o. male with history of diabetes mellitus who has been experiencing perianal pain and had been to Dr. Jeffie Pollock urologist on 12/25/20; at that time was diagnosed with prostatitis and epididymitis was given 1 shot of ceftriaxone and was placed on Bactrim.  On 01/09/32 patient went to the ER at [facility] with complaints of worsening pain and fever 104.  At the time patient had a CT scan and also was placed on ciprofloxacin which he was on for 4 days PTA but having worsening fever.  He also began developing more difficulty urinating with darkening urine.   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  He was started on empiric antibiotics. Furthermore, he also developed A. fib with RVR which required initial rate control with Cardizem which was able to be transitioned to PRN lopressor.  He continued to remain encephalopathic with intermittent fevers.  Further work-up was commenced. See below for further problem based plan.   Interval History:  Remains afebrile with slight improvement in mentation.  He was able to open his eyes some this morning and squeeze my fingers with his hands which is more commands than he has followed for me personally in the last few days. Updated wife and son bedside this morning as well.  ROS: Review of systems not obtained due to patient factors. Cognitive impairment   Assessment & Plan: * Severe sepsis (HCC) - Fever, tachycardia, lactic acidosis.  Source presumed prostatitis on admission; after no significant improvement and no prostatic abscess noted on repeat MRI pelvis on 01/15/2021, further work-up was pursued - s/p foley placement by urology on admission -Initially vanc/mero. Changed to CTX, amp, doxy on 7/7. ID also consulted  at that time -Fevers persisted intermittently - blood cultures remain negative from 7/4 and 7/6 - PICC placed 7/7 - differential at this time includes prostatic abscess (ruled out on MRI on 7/7) vs other unidentified infection such as CNS (concerning some about recent trip to [facility] as well as persistent fevers, and rigid extremities/head/neck) - CT H/C/A/P also unrevealing but some motion artifact but grossly normal - underwent LP on 7/8 under conscious sedation -Rocephin and ampicillin discontinued by ID on 01/17/2021 after LP results  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 Tube 1: WBC 32, seg neut 7%, Lymphs 91%, 1% eos Protein: 128 mg/dL, Glucose: 68 mg/dL - viral studies still pending (VZV, HSV, enterovirus) - diagnosis consistent given persistent AMS, photophobia, nuchal rigidity, ongoing agitation and CSF consistent with viral etiology - continue acyclovir - follow up pending studies - will discuss further with ID as well  - last documented fever: 01/15/21 @ 2054  Dysphagia - SLP eval: Dysphagia level 3 diet ordered but too encephalopathic - NGT placed on 7/6 and TF started  - overnight of 7/9, patient vomited some TF; rate reduced to 45 cc/hr - check residuals if further abd pain or nausea   BPH (benign prostatic hyperplasia) - continue flomax and finasteride per urology - continue foley per urology   Atrial fibrillation with RVR ([place]) - likely precipitated by sepsis - TSH normal - use PRN lopressor  AKI (acute kidney injury) ([place]) - presumed  from outlet obstruction; now s/p foley placed -Renal function has normalized - BMP daily - given UE edema and net pos ~ 8L (on 7/9) will start to diurese some with IV lasix. Noted he is also on acyclovir and NS currently - albumin also very low, will use IV alb while diuresing - large urine output since lasix/alb started (very good response)  Thrombocytopenia (HCC)-resolved as of  01/18/2021 - For now presumed due to acute illness but see further workup above - Trend CBC - resolved on 7/10  Elevated LFTs - slowly improving - may have been due to acute illness/sepsis  Diabetes mellitus type 2 in nonobese ([place]) - continue SSI and CBG monitoring   Old records reviewed in assessment of this patient  Antimicrobials: Vanc 7/4 >> 7/7 Meropenem 7/4 >> 7/7 Rocehin 7/7 >> 7/9 Ampicillin 7/7 >> 7/9  Doxy 7/7 >> current  Acyclovir 7/9 >> current   DVT prophylaxis: SCDs Start: 01/12/21 1736   Code Status:   Code Status: Full Code Family Communication: wife  Disposition Plan: Status is: Inpatient  Remains inpatient appropriate because:IV treatments appropriate due to intensity of illness or inability to take PO and Inpatient level of care appropriate due to severity of illness  Dispo: The patient is from: Home              Anticipated d/c is to:  pending PT eval              Patient currently is not medically stable to d/c.   Difficult to place patient No  Risk of unplanned readmission score: Unplanned Admission- Pilot do not use: 14.29   Objective: Blood pressure 114/77, pulse 84, temperature (!) 97.4 F (36.3 C), temperature source Axillary, resp. rate (!) 22, height 6\' 3"  (1.905 m), weight 107.6 kg, SpO2 94 %.  Examination: General appearance:  Little more comfortable appearing in bed today and able to follow some commands.  Less agitation noted Head: Normocephalic, without obvious abnormality, atraumatic Neck: Will not allow me to passively move neck sideways or flexion Eyes:  Right eye pupil irregular which is chronic/baseline; able to passively open left eye for the first time today.  Much less photosensitivity Lungs: clear to auscultation bilaterally Heart: irregularly irregular rhythm, S1, S2 normal, and tachycardic Abdomen: normal findings: bowel sounds normal and soft, non-tender Extremities:  Edema noted in bilateral upper extremities , but now  improving Skin: mobility and turgor normal Neurologic: Squeezed my fingers with bilateral hands  Consultants:  Urology Pulmonary/critical care - signed off ID IR  Procedures:    Data Reviewed: I have personally reviewed following labs and imaging studies Results for orders placed or performed during the hospital encounter of 01/12/21 (from the past 24 hour(s))  Glucose, capillary     Status: Abnormal   Collection Time: 01/17/21  3:53 PM  Result Value Ref Range   Glucose-Capillary 161 (H) 70 - 99 mg/dL  Glucose, capillary     Status: Abnormal   Collection Time: 01/17/21  7:32 PM  Result Value Ref Range   Glucose-Capillary 193 (H) 70 - 99 mg/dL   Comment 1 Notify RN    Comment 2 Document in Chart   Glucose, capillary     Status: Abnormal   Collection Time: 01/17/21 11:23 PM  Result Value Ref Range   Glucose-Capillary 167 (H) 70 - 99 mg/dL   Comment 1 Notify RN    Comment 2 Document in Chart   Glucose, capillary     Status: Abnormal  Collection Time: 01/18/21  4:04 AM  Result Value Ref Range   Glucose-Capillary 110 (H) 70 - 99 mg/dL   Comment 1 Notify RN    Comment 2 Document in Chart   Comprehensive metabolic panel     Status: Abnormal   Collection Time: 01/18/21  5:17 AM  Result Value Ref Range   Sodium 138 135 - 145 mmol/L   Potassium 3.7 3.5 - 5.1 mmol/L   Chloride 104 98 - 111 mmol/L   CO2 28 22 - 32 mmol/L   Glucose, Bld 127 (H) 70 - 99 mg/dL   BUN 17 8 - 23 mg/dL   Creatinine, Ser 0.75 0.61 - 1.24 mg/dL   Calcium 7.9 (L) 8.9 - 10.3 mg/dL   Total Protein 5.5 (L) 6.5 - 8.1 g/dL   Albumin 2.8 (L) 3.5 - 5.0 g/dL   AST 94 (H) 15 - 41 U/L   ALT 78 (H) 0 - 44 U/L   Alkaline Phosphatase 42 38 - 126 U/L   Total Bilirubin 0.7 0.3 - 1.2 mg/dL   GFR, Estimated >60 >60 mL/min   Anion gap 6 5 - 15  CBC with Differential/Platelet     Status: Abnormal   Collection Time: 01/18/21  5:17 AM  Result Value Ref Range   WBC 8.0 4.0 - 10.5 K/uL   RBC 2.97 (L) 4.22 - 5.81  MIL/uL   Hemoglobin 9.1 (L) 13.0 - 17.0 g/dL   HCT 27.6 (L) 39.0 - 52.0 %   MCV 92.9 80.0 - 100.0 fL   MCH 30.6 26.0 - 34.0 pg   MCHC 33.0 30.0 - 36.0 g/dL   RDW 15.1 11.5 - 15.5 %   Platelets 175 150 - 400 K/uL   nRBC 0.0 0.0 - 0.2 %   Neutrophils Relative % 24 %   Neutro Abs 2.0 1.7 - 7.7 K/uL   Lymphocytes Relative 67 %   Lymphs Abs 5.3 (H) 0.7 - 4.0 K/uL   Monocytes Relative 7 %   Monocytes Absolute 0.6 0.1 - 1.0 K/uL   Eosinophils Relative 1 %   Eosinophils Absolute 0.1 0.0 - 0.5 K/uL   Basophils Relative 1 %   Basophils Absolute 0.1 0.0 - 0.1 K/uL   Immature Granulocytes 0 %   Abs Immature Granulocytes 0.03 0.00 - 0.07 K/uL  Magnesium     Status: None   Collection Time: 01/18/21  5:17 AM  Result Value Ref Range   Magnesium 2.4 1.7 - 2.4 mg/dL  Phosphorus     Status: None   Collection Time: 01/18/21  5:17 AM  Result Value Ref Range   Phosphorus 3.1 2.5 - 4.6 mg/dL  Glucose, capillary     Status: Abnormal   Collection Time: 01/18/21  7:42 AM  Result Value Ref Range   Glucose-Capillary 139 (H) 70 - 99 mg/dL  Glucose, capillary     Status: Abnormal   Collection Time: 01/18/21 11:51 AM  Result Value Ref Range   Glucose-Capillary 185 (H) 70 - 99 mg/dL    Recent Results (from the past 240 hour(s))  Blood culture (routine single)     Status: None   Collection Time: 01/12/21 12:57 PM   Specimen: Right Antecubital; Blood  Result Value Ref Range Status   Specimen Description   Final    RIGHT ANTECUBITAL BOTTLES DRAWN AEROBIC AND ANAEROBIC   Special Requests Blood Culture adequate volume  Final   Culture   Final    NO GROWTH 5 DAYS Performed at [facility],  [address], [place], [place] [address]    Report Status 01/17/2021 FINAL  Final  Urine culture     Status: Abnormal   Collection Time: 01/12/21  1:08 PM   Specimen: In/Out Cath Urine  Result Value Ref Range Status   Specimen Description   Final    IN/OUT CATH URINE Performed at [facility], [address] [place], [place] [address]    Special Requests   Final    NONE Performed at [facility], [address], [place], [place] [address]    Culture 1,000 COLONIES/mL STAPHYLOCOCCUS EPIDERMIDIS (A)  Final   Report Status 01/15/2021 FINAL  Final   Organism ID, Bacteria STAPHYLOCOCCUS EPIDERMIDIS (A)  Final      Susceptibility   Staphylococcus epidermidis - MIC*    CIPROFLOXACIN >=8 RESISTANT Resistant     GENTAMICIN <=0.5 SENSITIVE Sensitive     NITROFURANTOIN <=16 SENSITIVE Sensitive     OXACILLIN <=0.25 SENSITIVE Sensitive     TETRACYCLINE 2 SENSITIVE Sensitive     VANCOMYCIN 2 SENSITIVE Sensitive     TRIMETH/SULFA >=320 RESISTANT Resistant     CLINDAMYCIN <=0.25 SENSITIVE Sensitive     RIFAMPIN <=0.5 SENSITIVE Sensitive     Inducible Clindamycin NEGATIVE Sensitive     * 1,000 COLONIES/mL STAPHYLOCOCCUS EPIDERMIDIS  Resp Panel by RT-PCR (Flu A&B, Covid) Nasopharyngeal Swab     Status: None   Collection Time: 01/12/21  1:09 PM   Specimen: Nasopharyngeal Swab; Nasopharyngeal(NP) swabs in vial transport medium  Result Value Ref Range Status   SARS Coronavirus 2 by RT PCR NEGATIVE NEGATIVE Final    Comment: (NOTE) SARS-CoV-2 target nucleic acids are NOT DETECTED.  The SARS-CoV-2 RNA is generally detectable in upper respiratory specimens during the acute phase of infection. The lowest concentration of SARS-CoV-2 viral copies this assay can detect is 138 copies/mL. A negative result does not preclude SARS-Cov-2 infection and should not be used as the sole basis for treatment or other patient management decisions. A negative result may occur with  improper specimen collection/handling, submission of specimen other than nasopharyngeal swab, presence of viral mutation(s) within the areas targeted by this assay, and inadequate number of viral copies(<138 copies/mL). A negative result must be combined with clinical observations, patient history, and epidemiological information. The expected  result is Negative.  Fact Sheet for Patients:  EntrepreneurPulse.com.au  Fact Sheet for Healthcare Providers:  IncredibleEmployment.be  This test is no t yet approved or cleared by the [place] FDA and  has been authorized for detection and/or diagnosis of SARS-CoV-2 by FDA under an Emergency Use Authorization (EUA). This EUA will remain  in effect (meaning this test can be used) for the duration of the COVID-19 declaration under Section 564(b)(1) of the Act, 21 U.S.C.section 360bbb-3(b)(1), unless the authorization is terminated  or revoked sooner.       Influenza A by PCR NEGATIVE NEGATIVE Final   Influenza B by PCR NEGATIVE NEGATIVE Final    Comment: (NOTE) The Xpert Xpress SARS-CoV-2/FLU/RSV plus assay is intended as an aid in the diagnosis of influenza from Nasopharyngeal swab specimens and should not be used as a sole basis for treatment. Nasal washings and aspirates are unacceptable for Xpert Xpress SARS-CoV-2/FLU/RSV testing.  Fact Sheet for Patients: EntrepreneurPulse.com.au  Fact Sheet for Healthcare Providers: IncredibleEmployment.be  This test is not yet approved or cleared by the [place] FDA and has been authorized for detection and/or diagnosis of SARS-CoV-2 by FDA under an Emergency Use Authorization (EUA). This EUA will remain  in effect (meaning this test can be used) for the duration of the COVID-19 declaration under Section 564(b)(1) of the Act, 21 U.S.C. section 360bbb-3(b)(1), unless the authorization is terminated or revoked.  Performed at [facility], [address], [place], [place] [address]   MRSA Next Gen by PCR, Nasal     Status: None   Collection Time: 01/12/21  7:30 PM  Result Value Ref Range Status   MRSA by PCR Next Gen NOT DETECTED NOT DETECTED Final    Comment: (NOTE) The GeneXpert MRSA Assay (FDA approved for NASAL specimens only), is one component of a  comprehensive MRSA colonization surveillance program. It is not intended to diagnose MRSA infection nor to guide or monitor treatment for MRSA infections. Test performance is not FDA approved in patients less than 52 years old. Performed at [facility], [place] [address], [place], [place] [address]   Culture, blood (single)     Status: None (Preliminary result)   Collection Time: 01/14/21  8:50 AM   Specimen: BLOOD LEFT HAND  Result Value Ref Range Status   Specimen Description   Final    BLOOD LEFT HAND Performed at [facility] [address], [place], [place] [address]    Special Requests   Final    BOTTLES DRAWN AEROBIC ONLY Blood Culture adequate volume Performed at [place] [address] [place], [place] [address]    Culture   Final    NO GROWTH 4 DAYS Performed at [facility], [place] [address], [place], [place] [address]    Report Status PENDING  Incomplete  CSF culture w Gram Stain     Status: None (Preliminary result)   Collection Time: 01/16/21  4:30 PM   Specimen: Cerebrospinal Fluid  Result Value Ref Range Status   Specimen Description   Final    LUMBAR Performed at [facility] [address] [place], [place] [address]    Special Requests   Final    NONE Performed at [facility], [place] [address] [place], [place] [address]    Gram Stain   Final    WBC PRESENT, PREDOMINANTLY MONONUCLEAR NO ORGANISMS SEEN CYTOSPIN SMEAR Gram Stain Report Called to,Read Back By and Verified With: S.WEAVER, RN AT 2153 ON 07.08.22 BY N.[email or web address] Performed at [facility] [address], [place], [place] [address]    Culture   Final    NO GROWTH 2 DAYS Performed at [facility] [address] [place], [place] [address]    Report Status PENDING  Incomplete  Culture, fungus without smear     Status: None (Preliminary result)   Collection Time:  01/16/21  4:30 PM   Specimen: Cerebrospinal Fluid  Result Value Ref Range Status   Specimen Description   Final    LUMBAR Performed at [facility] [address], [place], [place] [address]    Special Requests   Final    NONE Performed at [facility], [place] [address], [place], [place] [address]    Culture   Final    NO FUNGUS ISOLATED AFTER 2 DAYS Performed at [facility], [place] [address] [place],  [address]    Report Status PENDING  Incomplete     Radiology Studies: IR Fluoro Guide Ndl Plmt / BX  Result Date: 01/16/2021 CLINICAL DATA:  Mental status change EXAM: DIAGNOSTIC LUMBAR PUNCTURE UNDER FLUOROSCOPIC GUIDANCE FLUOROSCOPY TIME:  6  seconds; 1 mGy PROCEDURE: Informed consent was obtained from the family prior to the procedure, including potential complications of headache, allergy, and pain. Intravenous Fentanyl 15mcg, Benadryl 25 mg, and Versed 2mg  were administered as conscious sedation during continuous monitoring of the patient's level of consciousness and physiological / cardiorespiratory status by the radiology RN, with a total moderate sedation time of 10 minutes. With the patient prone, the lower back was prepped with Betadine. 1% Lidocaine was used for local anesthesia. Lumbar puncture was performed at the L4 level from a left parasagittal approach using a 20 gauge needle with return of pale yellow tinged CSF. 10 ml of CSF were obtained for laboratory studies, divided among for sequentially numbered vials. The patient tolerated the procedure well and there were no apparent complications. IMPRESSION: 1. Technically successful lumbar puncture under fluoroscopy Electronically Signed   By: Lucrezia Europe M.D.   On: 01/16/2021 17:05   DG CHEST PORT 1 VIEW  Result Date: 01/17/2021 CLINICAL DATA:  History of diabetes and known bladder mass EXAM: PORTABLE CHEST 1 VIEW COMPARISON:  01/15/2021 FINDINGS: Cardiac shadow is stable. Feeding  catheter extends into the stomach. Right-sided PICC line is noted the cavoatrial junction stable from the prior exam. Bilateral pleural effusions are again seen. Left retrocardiac consolidation is noted as well. No significant vascular congestion is seen. IMPRESSION: Bilateral effusions and left retrocardiac opacity. Tubes and lines as described. Electronically Signed   By: Inez Catalina M.D.   On: 01/17/2021 21:13   DG CHEST PORT 1 VIEW  Final Result    IR Fluoro Guide Ndl Plmt / BX  Final Result    DG Chest Port 1 View  Final Result    CT HEAD WO CONTRAST  Final Result    CT CHEST ABDOMEN PELVIS W CONTRAST  Final Result    MR PELVIS WO CONTRAST  Final Result    [place] EKG SITE RITE  Final Result    DG CHEST PORT 1 VIEW  Final Result    DG Abd 1 View  Final Result    CT ABDOMEN PELVIS W CONTRAST  Final Result    DG Chest Port 1 View  Final Result      Scheduled Meds:  Chlorhexidine Gluconate Cloth  6 each Topical Q0600   finasteride  5 mg Oral Daily   free water  100 mL Per Tube Q4H   furosemide  40 mg Intravenous Daily   gabapentin  600 mg Per Tube BID   insulin aspart  0-6 Units Subcutaneous Q4H   lidocaine  1 patch Transdermal Q24H   mouth rinse  15 mL Mouth Rinse BID   sodium chloride flush  10-40 mL Intracatheter Q12H   PRN Meds: sodium chloride, acetaminophen (TYLENOL) oral liquid 160 mg/5 mL, acetaminophen, hyoscyamine, lip balm, LORazepam, metoprolol tartrate, morphine injection, ondansetron (ZOFRAN) IV, polyvinyl alcohol, sodium chloride flush Continuous Infusions:  sodium chloride 10 mL/hr at 01/18/21 1248   sodium chloride 50 mL/hr at 01/18/21 0145   acyclovir 1,000 mg (01/18/21 1315)   albumin human Stopped (01/18/21 1158)   doxycycline (VIBRAMYCIN) IV Stopped (01/18/21 1159)   feeding supplement (GLUCERNA 1.5 CAL) 55 mL/hr at 01/18/21 1220     LOS: 6 days  Time spent: Greater than 50% of the 35 minute visit was spent in counseling/coordination of  care for the patient as laid out in the A&P.   Dwyane Dee, MD [facility] 01/18/2021, 1:49 PM

## 2021-01-18 NOTE — Progress Notes (Signed)
At approximately 2030, patient stated his stomach was hurting and then threw up a copious amount of tube feed.  Tube feeding was placed on hold.  Per [place] - tube feeding will be held until the AM.

## 2021-01-19 ENCOUNTER — Inpatient Hospital Stay (HOSPITAL_COMMUNITY): Payer: Medicare HMO

## 2021-01-19 DIAGNOSIS — A86 Unspecified viral encephalitis: Secondary | ICD-10-CM | POA: Diagnosis not present

## 2021-01-19 DIAGNOSIS — R6520 Severe sepsis without septic shock: Secondary | ICD-10-CM | POA: Diagnosis not present

## 2021-01-19 DIAGNOSIS — A419 Sepsis, unspecified organism: Secondary | ICD-10-CM | POA: Diagnosis not present

## 2021-01-19 LAB — COMPREHENSIVE METABOLIC PANEL
ALT: 70 U/L — ABNORMAL HIGH (ref 0–44)
AST: 76 U/L — ABNORMAL HIGH (ref 15–41)
Albumin: 3.6 g/dL (ref 3.5–5.0)
Alkaline Phosphatase: 44 U/L (ref 38–126)
Anion gap: 6 (ref 5–15)
BUN: 17 mg/dL (ref 8–23)
CO2: 27 mmol/L (ref 22–32)
Calcium: 8.5 mg/dL — ABNORMAL LOW (ref 8.9–10.3)
Chloride: 104 mmol/L (ref 98–111)
Creatinine, Ser: 0.67 mg/dL (ref 0.61–1.24)
GFR, Estimated: >60 mL/min (ref >60)
Glucose, Bld: 162 mg/dL — ABNORMAL HIGH (ref 70–99)
Potassium: 4.1 mmol/L (ref 3.5–5.1)
Sodium: 137 mmol/L (ref 135–145)
Total Bilirubin: 0.9 mg/dL (ref 0.3–1.2)
Total Protein: 6.3 g/dL — ABNORMAL LOW (ref 6.5–8.1)

## 2021-01-19 LAB — CBC WITH DIFFERENTIAL/PLATELET
Abs Immature Granulocytes: 0.05 10*3/uL (ref 0.00–0.07)
Basophils Absolute: 0 10*3/uL (ref 0.0–0.1)
Basophils Relative: 0 %
Eosinophils Absolute: 0.1 10*3/uL (ref 0.0–0.5)
Eosinophils Relative: 1 %
HCT: 28.7 % — ABNORMAL LOW (ref 39.0–52.0)
Hemoglobin: 9.7 g/dL — ABNORMAL LOW (ref 13.0–17.0)
Immature Granulocytes: 1 %
Lymphocytes Relative: 61 %
Lymphs Abs: 5 10*3/uL — ABNORMAL HIGH (ref 0.7–4.0)
MCH: 31.3 pg (ref 26.0–34.0)
MCHC: 33.8 g/dL (ref 30.0–36.0)
MCV: 92.6 fL (ref 80.0–100.0)
Monocytes Absolute: 0.6 10*3/uL (ref 0.1–1.0)
Monocytes Relative: 7 %
Neutro Abs: 2.5 10*3/uL (ref 1.7–7.7)
Neutrophils Relative %: 30 %
Platelets: 235 10*3/uL (ref 150–400)
RBC: 3.1 MIL/uL — ABNORMAL LOW (ref 4.22–5.81)
RDW: 15.1 % (ref 11.5–15.5)
WBC: 8.3 10*3/uL (ref 4.0–10.5)
nRBC: 0 % (ref 0.0–0.2)

## 2021-01-19 LAB — GLUCOSE, CAPILLARY
Glucose-Capillary: 141 mg/dL — ABNORMAL HIGH (ref 70–99)
Glucose-Capillary: 152 mg/dL — ABNORMAL HIGH (ref 70–99)
Glucose-Capillary: 153 mg/dL — ABNORMAL HIGH (ref 70–99)
Glucose-Capillary: 162 mg/dL — ABNORMAL HIGH (ref 70–99)
Glucose-Capillary: 174 mg/dL — ABNORMAL HIGH (ref 70–99)
Glucose-Capillary: 196 mg/dL — ABNORMAL HIGH (ref 70–99)

## 2021-01-19 LAB — CULTURE, BLOOD (SINGLE)
Culture: NO GROWTH
Special Requests: ADEQUATE

## 2021-01-19 LAB — MAGNESIUM: Magnesium: 2.3 mg/dL (ref 1.7–2.4)

## 2021-01-19 LAB — VARICELLA ZOSTER ANTIBODY, IGM: Varicella-Zoster Ab, IgM: <0.91 index (ref 0.00–0.90)

## 2021-01-19 LAB — PHOSPHORUS: Phosphorus: 2.8 mg/dL (ref 2.5–4.6)

## 2021-01-19 MED ORDER — ORAL CARE MOUTH RINSE
15.0000 mL | Freq: Two times a day (BID) | OROMUCOSAL | Status: DC
  Administered 2021-01-19 – 2021-01-20 (×4): 15 mL via OROMUCOSAL

## 2021-01-19 MED ORDER — GLUCERNA 1.5 CAL PO LIQD
1000.0000 mL | ORAL | Status: DC
  Administered 2021-01-19: 1000 mL
  Filled 2021-01-19 (×2): qty 1000

## 2021-01-19 MED ORDER — CHLORHEXIDINE GLUCONATE 0.12 % MT SOLN
15.0000 mL | Freq: Two times a day (BID) | OROMUCOSAL | Status: DC
  Administered 2021-01-19 – 2021-01-25 (×11): 15 mL via OROMUCOSAL
  Filled 2021-01-19 (×12): qty 15

## 2021-01-19 NOTE — Progress Notes (Signed)
SLP Cancellation Note  Patient Details Name: Joseph Banks MRN: [id number] DOB: 05-Sep-1945   Cancelled treatment:       Reason Eval/Treat Not Completed: Medical issues which prohibited therapy. RN reports pt is improving gradually, but continues to be inappropriate for PO trials at this time. Will continue to follow and proceed accordingly.  Grae Cannata B. Quentin Ore, [facility], [place] Speech Language Pathologist Office: [phone] Pager: [phone]  Shonna Chock 01/19/2021, 3:09 PM

## 2021-01-19 NOTE — Progress Notes (Signed)
Progress Note    Joseph Banks   MVH:[id number]  DOB: 1946-02-28  DOA: 01/12/2021     7  PCP: Glenda Chroman, MD  CC: perianal pain  Hospital Course: BENUEL LY is a 75 y.o. male with history of diabetes mellitus who has been experiencing perianal pain and had been to Dr. Jeffie Pollock urologist on 12/25/20; at that time was diagnosed with prostatitis and epididymitis was given 1 shot of ceftriaxone and was placed on Bactrim.  On 01/09/32 patient went to the ER at [facility] with complaints of worsening pain and fever 104.  At the time patient had a CT scan and also was placed on ciprofloxacin which he was on for 4 days PTA but having worsening fever.  He also began developing more difficulty urinating with darkening urine.   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  He was started on empiric antibiotics. Furthermore, he also developed A. fib with RVR which required initial rate control with Cardizem which was able to be transitioned to PRN lopressor.  He continued to remain encephalopathic with intermittent fevers.  Further work-up was commenced. See below for further problem based plan.   Interval History:  Not much improvement since yesterday.  Still saying a few words appropriately here and there but unable to follow commands on a sustained basis.  ROS: Review of systems not obtained due to patient factors. Cognitive impairment   Assessment & Plan: * Severe sepsis (HCC) - Fever, tachycardia, lactic acidosis.  Source presumed prostatitis on admission; after no significant improvement and no prostatic abscess noted on repeat MRI pelvis on 01/15/2021, further work-up was pursued - s/p foley placement by urology on admission -Initially vanc/mero. Changed to CTX, amp, doxy on 7/7. 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 Tube 1: WBC 32, seg neut 7%, Lymphs 91%, 1% eos Protein: 128 mg/dL, Glucose: 68 mg/dL - viral studies still pending (VZV, HSV, enterovirus) - diagnosis consistent given persistent AMS, photophobia, nuchal rigidity, ongoing agitation and CSF consistent with viral etiology - continue acyclovir - follow up pending studies - will discuss further with ID as well  - last documented fever: 01/15/21 @ 2054  Dysphagia - SLP eval: Dysphagia level 3 diet ordered but too encephalopathic - NGT placed on 7/6 and TF started  - overnight of 7/9, patient vomited some TF; rate reduced to 45 cc/hr - check residuals if further abd pain or nausea   BPH (benign prostatic hyperplasia) - continue flomax and finasteride per urology - continue foley per urology   Atrial fibrillation with RVR ([place]) - likely precipitated by sepsis - TSH normal - use PRN lopressor  AKI (acute kidney injury) ([place]) - presumed from outlet obstruction; now s/p foley placed -Renal function has normalized - BMP daily - given UE edema and net pos ~ 8L (on 7/9) will  start to diurese some with IV lasix. Noted he is also on acyclovir and NS currently - albumin also very low, will use IV alb while diuresing - large urine output since lasix/alb started (very good response)  Thrombocytopenia (HCC)-resolved as of 01/18/2021 - For now presumed due to acute illness but see further workup above - Trend CBC - resolved on  7/10  Elevated LFTs - slowly improving - may have been due to acute illness/sepsis  Diabetes mellitus type 2 in nonobese ([place]) - continue SSI and CBG monitoring   Old records reviewed in assessment of this patient  Antimicrobials: Vanc 7/4 >> 7/7 Meropenem 7/4 >> 7/7 Rocehin 7/7 >> 7/9 Ampicillin 7/7 >> 7/9  Doxy 7/7 >> current  Acyclovir 7/9 >> current   DVT prophylaxis: SCDs Start: 01/12/21 1736   Code Status:   Code Status: Full Code Family Communication: wife  Disposition Plan: Status is: Inpatient  Remains inpatient appropriate because:IV treatments appropriate due to intensity of illness or inability to take PO and Inpatient level of care appropriate due to severity of illness  Dispo: The patient is from: Home              Anticipated d/c is to:  pending PT eval              Patient currently is not medically stable to d/c.   Difficult to place patient No  Risk of unplanned readmission score: Unplanned Admission- Pilot do not use: 14.66   Objective: Blood pressure (!) 151/57, pulse 85, temperature 97.7 F (36.5 C), temperature source Oral, resp. rate 15, height 6\' 3"  (1.905 m), weight 104.4 kg, SpO2 100 %.  Examination: General appearance:  Little more comfortable appearing in bed today and able to follow some commands.  Less agitation noted Head: Normocephalic, without obvious abnormality, atraumatic Neck: Will not allow me to passively move neck sideways or flexion Eyes:  Right eye pupil irregular which is chronic/baseline; able to passively open left eye for the first time today.  Much less photosensitivity Lungs: clear to auscultation bilaterally Heart: irregularly irregular rhythm, S1, S2 normal, and tachycardic Abdomen: normal findings: bowel sounds normal and soft, non-tender Extremities:  Edema noted in bilateral upper extremities , but now improving Skin: mobility and turgor normal Neurologic: Squeezed my fingers with bilateral hands  Consultants:   Urology Pulmonary/critical care - signed off ID IR  Procedures:    Data Reviewed: I have personally reviewed following labs and imaging studies Results for orders placed or performed during the hospital encounter of 01/12/21 (from the past 24 hour(s))  Glucose, capillary     Status: Abnormal   Collection Time: 01/18/21  4:53 PM  Result Value Ref Range   Glucose-Capillary 162 (H) 70 - 99 mg/dL   Comment 1 Notify RN    Comment 2 Document in Chart   Glucose, capillary     Status: Abnormal   Collection Time: 01/18/21  8:02 PM  Result Value Ref Range   Glucose-Capillary 151 (H) 70 - 99 mg/dL  Glucose, capillary     Status: Abnormal   Collection Time: 01/19/21 12:29 AM  Result Value Ref Range   Glucose-Capillary 141 (H) 70 - 99 mg/dL  Glucose, capillary     Status: Abnormal   Collection Time: 01/19/21  4:33 AM  Result Value Ref Range   Glucose-Capillary 153 (H) 70 - 99 mg/dL  Comprehensive metabolic panel     Status: Abnormal   Collection Time: 01/19/21  5:45 AM  Result Value  Ref Range   Sodium 137 135 - 145 mmol/L   Potassium 4.1 3.5 - 5.1 mmol/L   Chloride 104 98 - 111 mmol/L   CO2 27 22 - 32 mmol/L   Glucose, Bld 162 (H) 70 - 99 mg/dL   BUN 17 8 - 23 mg/dL   Creatinine, Ser 0.67 0.61 - 1.24 mg/dL   Calcium 8.5 (L) 8.9 - 10.3 mg/dL   Total Protein 6.3 (L) 6.5 - 8.1 g/dL   Albumin 3.6 3.5 - 5.0 g/dL   AST 76 (H) 15 - 41 U/L   ALT 70 (H) 0 - 44 U/L   Alkaline Phosphatase 44 38 - 126 U/L   Total Bilirubin 0.9 0.3 - 1.2 mg/dL   GFR, Estimated >60 >60 mL/min   Anion gap 6 5 - 15  CBC with Differential/Platelet     Status: Abnormal   Collection Time: 01/19/21  5:45 AM  Result Value Ref Range   WBC 8.3 4.0 - 10.5 K/uL   RBC 3.10 (L) 4.22 - 5.81 MIL/uL   Hemoglobin 9.7 (L) 13.0 - 17.0 g/dL   HCT 28.7 (L) 39.0 - 52.0 %   MCV 92.6 80.0 - 100.0 fL   MCH 31.3 26.0 - 34.0 pg   MCHC 33.8 30.0 - 36.0 g/dL   RDW 15.1 11.5 - 15.5 %   Platelets 235 150 - 400 K/uL   nRBC 0.0 0.0  - 0.2 %   Neutrophils Relative % 30 %   Neutro Abs 2.5 1.7 - 7.7 K/uL   Lymphocytes Relative 61 %   Lymphs Abs 5.0 (H) 0.7 - 4.0 K/uL   Monocytes Relative 7 %   Monocytes Absolute 0.6 0.1 - 1.0 K/uL   Eosinophils Relative 1 %   Eosinophils Absolute 0.1 0.0 - 0.5 K/uL   Basophils Relative 0 %   Basophils Absolute 0.0 0.0 - 0.1 K/uL   Immature Granulocytes 1 %   Abs Immature Granulocytes 0.05 0.00 - 0.07 K/uL   Reactive, Benign Lymphocytes PRESENT    Smudge Cells PRESENT   Magnesium     Status: None   Collection Time: 01/19/21  5:45 AM  Result Value Ref Range   Magnesium 2.3 1.7 - 2.4 mg/dL  Phosphorus     Status: None   Collection Time: 01/19/21  5:45 AM  Result Value Ref Range   Phosphorus 2.8 2.5 - 4.6 mg/dL  Glucose, capillary     Status: Abnormal   Collection Time: 01/19/21  7:40 AM  Result Value Ref Range   Glucose-Capillary 162 (H) 70 - 99 mg/dL   Comment 1 Notify RN    Comment 2 Document in Chart   Glucose, capillary     Status: Abnormal   Collection Time: 01/19/21 11:30 AM  Result Value Ref Range   Glucose-Capillary 196 (H) 70 - 99 mg/dL   Comment 1 Notify RN    Comment 2 Document in Chart     Recent Results (from the past 240 hour(s))  Blood culture (routine single)     Status: None   Collection Time: 01/12/21 12:57 PM   Specimen: Right Antecubital; Blood  Result Value Ref Range Status   Specimen Description   Final    RIGHT ANTECUBITAL BOTTLES DRAWN AEROBIC AND ANAEROBIC   Special Requests Blood Culture adequate volume  Final   Culture   Final    NO GROWTH 5 DAYS Performed at [facility], [address], [place], [place] [address]    Report Status 01/17/2021 FINAL  Final  Urine culture     Status: Abnormal   Collection Time: 01/12/21  1:08 PM   Specimen: In/Out Cath Urine  Result Value Ref Range Status   Specimen Description   Final    IN/OUT CATH URINE Performed at [facility], [address] [place], [place] [address]    Special Requests   Final     NONE Performed at [facility], [address], [place], [place] [address]    Culture 1,000 COLONIES/mL STAPHYLOCOCCUS EPIDERMIDIS (A)  Final   Report Status 01/15/2021 FINAL  Final   Organism ID, Bacteria STAPHYLOCOCCUS EPIDERMIDIS (A)  Final      Susceptibility   Staphylococcus epidermidis - MIC*    CIPROFLOXACIN >=8 RESISTANT Resistant     GENTAMICIN <=0.5 SENSITIVE Sensitive     NITROFURANTOIN <=16 SENSITIVE Sensitive     OXACILLIN <=0.25 SENSITIVE Sensitive     TETRACYCLINE 2 SENSITIVE Sensitive     VANCOMYCIN 2 SENSITIVE Sensitive     TRIMETH/SULFA >=320 RESISTANT Resistant     CLINDAMYCIN <=0.25 SENSITIVE Sensitive     RIFAMPIN <=0.5 SENSITIVE Sensitive     Inducible Clindamycin NEGATIVE Sensitive     * 1,000 COLONIES/mL STAPHYLOCOCCUS EPIDERMIDIS  Resp Panel by RT-PCR (Flu A&B, Covid) Nasopharyngeal Swab     Status: None   Collection Time: 01/12/21  1:09 PM   Specimen: Nasopharyngeal Swab; Nasopharyngeal(NP) swabs in vial transport medium  Result Value Ref Range Status   SARS Coronavirus 2 by RT PCR NEGATIVE NEGATIVE Final    Comment: (NOTE) SARS-CoV-2 target nucleic acids are NOT DETECTED.  The SARS-CoV-2 RNA is generally detectable in upper respiratory specimens during the acute phase of infection. The lowest concentration of SARS-CoV-2 viral copies this assay can detect is 138 copies/mL. A negative result does not preclude SARS-Cov-2 infection and should not be used as the sole basis for treatment or other patient management decisions. A negative result may occur with  improper specimen collection/handling, submission of specimen other than nasopharyngeal swab, presence of viral mutation(s) within the areas targeted by this assay, and inadequate number of viral copies(<138 copies/mL). A negative result must be combined with clinical observations, patient history, and epidemiological information. The expected result is Negative.  Fact Sheet for Patients:   EntrepreneurPulse.com.au  Fact Sheet for Healthcare Providers:  IncredibleEmployment.be  This test is no t yet approved or cleared by the [place] FDA and  has been authorized for detection and/or diagnosis of SARS-CoV-2 by FDA under an Emergency Use Authorization (EUA). This EUA will remain  in effect (meaning this test can be used) for the duration of the COVID-19 declaration under Section 564(b)(1) of the Act, 21 U.S.C.section 360bbb-3(b)(1), unless the authorization is terminated  or revoked sooner.       Influenza A by PCR NEGATIVE NEGATIVE Final   Influenza B by PCR NEGATIVE NEGATIVE Final    Comment: (NOTE) The Xpert Xpress SARS-CoV-2/FLU/RSV plus assay is intended as an aid in the diagnosis of influenza from Nasopharyngeal swab specimens and should not be used as a sole basis for treatment. Nasal washings and aspirates are unacceptable for Xpert Xpress SARS-CoV-2/FLU/RSV testing.  Fact Sheet for Patients: EntrepreneurPulse.com.au  Fact Sheet for Healthcare Providers: IncredibleEmployment.be  This test is not yet approved or cleared by the [place] FDA and has been authorized for detection and/or diagnosis of SARS-CoV-2 by FDA under an Emergency Use Authorization (EUA). This EUA will remain in effect (meaning this test can be used) for the duration of the COVID-19  declaration under Section 564(b)(1) of the Act, 21 U.S.C. section 360bbb-3(b)(1), unless the authorization is terminated or revoked.  Performed at [facility], [address], [place], [place] [address]   MRSA Next Gen by PCR, Nasal     Status: None   Collection Time: 01/12/21  7:30 PM  Result Value Ref Range Status   MRSA by PCR Next Gen NOT DETECTED NOT DETECTED Final    Comment: (NOTE) The GeneXpert MRSA Assay (FDA approved for NASAL specimens only), is one component of a comprehensive MRSA colonization  surveillance program. It is not intended to diagnose MRSA infection nor to guide or monitor treatment for MRSA infections. Test performance is not FDA approved in patients less than 16 years old. Performed at [facility], [place] [address] [place], [place] [address]   Culture, blood (single)     Status: None   Collection Time: 01/14/21  8:50 AM   Specimen: BLOOD LEFT HAND  Result Value Ref Range Status   Specimen Description   Final    BLOOD LEFT HAND Performed at [facility] [address], [place], [place] [address]    Special Requests   Final    BOTTLES DRAWN AEROBIC ONLY Blood Culture adequate volume Performed at [facility] [address], [place], [place] [address]    Culture   Final    NO GROWTH 5 DAYS Performed at [facility], [place] [address] [place], [place] [address]    Report Status 01/19/2021 FINAL  Final  CSF culture w Gram Stain     Status: None (Preliminary result)   Collection Time: 01/16/21  4:30 PM   Specimen: Cerebrospinal Fluid  Result Value Ref Range Status   Specimen Description   Final    LUMBAR Performed at [place] [address], [place], [place] [address]    Special Requests   Final    NONE Performed at [facility], [place] [address], [place], [place] [address]    Gram Stain   Final    WBC PRESENT, PREDOMINANTLY MONONUCLEAR NO ORGANISMS SEEN CYTOSPIN SMEAR Gram Stain Report Called to,Read Back By and Verified With: S.WEAVER, RN AT 2153 ON 07.08.22 BY N.THOMPSON Performed at [place] [address] [place], [place] [address]    Culture   Final    NO GROWTH 3 DAYS Performed at [facility], [place] [address] [place], [place] [address]    Report Status PENDING  Incomplete  Culture, fungus without smear     Status: None (Preliminary result)   Collection Time: 01/16/21  4:30 PM   Specimen: Cerebrospinal Fluid   Result Value Ref Range Status   Specimen Description   Final    LUMBAR Performed at [place] [address], [place], [place] [address]    Special Requests   Final    NONE Performed at [facility], [place] [address] [place], [place] [address]    Culture   Final    NO FUNGUS ISOLATED AFTER 3 DAYS Performed at [facility], [place] [address], [place], [place] [address]    Report Status PENDING  Incomplete  Anaerobic culture w Gram Stain     Status: None (Preliminary result)   Collection Time: 01/16/21  4:30 PM   Specimen: Cerebrospinal Fluid  Result Value Ref Range Status   Specimen Description   Final    LUMBAR Performed at [facility], [place] [address] [place], [place] [address]    Special Requests   Final    NONE Performed at [facility], [place] [address] [place], [place] [address]    Gram Stain   Final    WBC PRESENT, PREDOMINANTLY MONONUCLEAR NO ORGANISMS SEEN CYTOSPIN SMEAR Performed at [facility], [place] [address] [place], [place] [address]    Culture   Final    NO ANAEROBES ISOLATED; CULTURE IN PROGRESS FOR 5 DAYS   Report Status PENDING  Incomplete     Radiology Studies: DG Abd 1 View  Result Date: 01/19/2021 CLINICAL DATA:  NG tube placement EXAM: ABDOMEN - 1 VIEW COMPARISON:  01/13/2021 FINDINGS: Feeding tube is in place with the tip in the distal stomach. Right PICC line tip is in the right atrium. Nonobstructive bowel gas pattern. No organomegaly or free air. IMPRESSION: Feeding tube tip in the distal stomach. Electronically Signed   By: Rolm Baptise M.D.   On: 01/19/2021 08:21   DG CHEST PORT 1 VIEW  Result Date: 01/17/2021 CLINICAL DATA:  History of diabetes and known bladder mass EXAM: PORTABLE CHEST 1 VIEW COMPARISON:  01/15/2021 FINDINGS: Cardiac shadow is stable. Feeding catheter extends into the stomach. Right-sided PICC line is noted the cavoatrial junction stable  from the prior exam. Bilateral pleural effusions are again seen. Left retrocardiac consolidation is noted as well. No significant vascular congestion is seen. IMPRESSION: Bilateral effusions and left retrocardiac opacity. Tubes and lines as described. Electronically Signed   By: Inez Catalina M.D.   On: 01/17/2021 21:13   DG Abd 1 View  Final Result    DG CHEST PORT 1 VIEW  Final Result    IR Fluoro Guide Ndl Plmt / BX  Final Result    DG Chest Port 1 View  Final Result    CT HEAD WO CONTRAST  Final Result    CT CHEST ABDOMEN PELVIS W CONTRAST  Final Result    MR PELVIS WO CONTRAST  Final Result    [place] EKG SITE RITE  Final Result    DG CHEST PORT 1 VIEW  Final Result    DG Abd 1 View  Final Result    CT ABDOMEN PELVIS W CONTRAST  Final Result    DG Chest Port 1 View  Final Result      Scheduled Meds:  chlorhexidine  15 mL Mouth Rinse BID   Chlorhexidine Gluconate Cloth  6 each Topical Q0600   finasteride  5 mg Oral Daily   free water  100 mL Per Tube Q4H   furosemide  40 mg Intravenous Daily   gabapentin  600 mg Per Tube BID   insulin aspart  0-6 Units Subcutaneous Q4H   lidocaine  1 patch Transdermal Q24H   mouth rinse  15 mL Mouth Rinse BID   mouth rinse  15 mL Mouth Rinse q12n4p   sodium chloride flush  10-40 mL Intracatheter Q12H   PRN Meds: sodium chloride, acetaminophen (TYLENOL) oral liquid 160 mg/5 mL, acetaminophen, hyoscyamine, lip balm, LORazepam, metoprolol tartrate, morphine injection, ondansetron (ZOFRAN) IV, polyvinyl alcohol, sodium chloride flush Continuous Infusions:  sodium chloride 10 mL/hr at 01/19/21 1148   sodium chloride 50 mL/hr at 01/19/21 1148   acyclovir Stopped (01/19/21 0653)   doxycycline (VIBRAMYCIN) IV Stopped (01/19/21 1136)   feeding supplement (GLUCERNA 1.5 CAL)       LOS: 7 days  Time spent: Greater than 50% of the 35 minute visit was spent  in counseling/coordination of care for the patient as laid out in the A&P.    Dwyane Dee, MD [facility] 01/19/2021, 2:13 PM

## 2021-01-19 NOTE — Progress Notes (Signed)
[facility] for Infectious Disease    Date of Admission:  01/12/2021   Total days of antibiotics 8/day 5 doxy/ d3 acyclovir   ID: Joseph Banks is a 75 y.o. male with  aseptic meningitis Principal Problem:   Severe sepsis ([place]) Active Problems:   Acute lower UTI   Diabetes mellitus type 2 in nonobese (HCC)   Elevated LFTs   AKI (acute kidney injury) ([place])   Atrial fibrillation with RVR (HCC)   BPH (benign prostatic hyperplasia)   Dysphagia   Viral encephalitis    Subjective: Afebrile, c/o back pain. +photophobia, becoming more responsive  Medications:   chlorhexidine  15 mL Mouth Rinse BID   Chlorhexidine Gluconate Cloth  6 each Topical Q0600   finasteride  5 mg Oral Daily   free water  100 mL Per Tube Q4H   furosemide  40 mg Intravenous Daily   gabapentin  600 mg Per Tube BID   insulin aspart  0-6 Units Subcutaneous Q4H   lidocaine  1 patch Transdermal Q24H   mouth rinse  15 mL Mouth Rinse BID   mouth rinse  15 mL Mouth Rinse q12n4p   sodium chloride flush  10-40 mL Intracatheter Q12H    Objective: Vital signs in last 24 hours: Temp:  [97.7 F (36.5 C)-99.3 F (37.4 C)] 97.9 F (36.6 C) (07/11 1200) Pulse Rate:  [80-91] 83 (07/11 1605) Resp:  [12-21] 18 (07/11 1605) BP: (136-175)/(57-94) 157/75 (07/11 1605) SpO2:  [91 %-100 %] 97 % (07/11 1605) Weight:  [104.4 kg] 104.4 kg (07/11 3810) Physical Exam  Constitutional: He is oriented to person, only. He appears well-developed and well-nourished. In mild distress.  HENT:  Mouth/Throat: Oropharynx is clear and moist. No oropharyngeal exudate.  Cardiovascular: Normal rate, regular rhythm and normal heart sounds. Exam reveals no gallop and no friction rub.  No murmur heard.  Pulmonary/Chest: Effort normal and breath sounds normal. No respiratory distress. He has no wheezes.  Abdominal: Soft. Bowel sounds are normal. He exhibits no distension. There is no tenderness.  Neck: no nuchal rigidity Skin: Skin is  warm and dry. No rash noted. No erythema.     Lab Results Recent Labs    01/18/21 0517 01/19/21 0545  WBC 8.0 8.3  HGB 9.1* 9.7*  HCT 27.6* 28.7*  NA 138 137  K 3.7 4.1  CL 104 104  CO2 28 27  BUN 17 17  CREATININE 0.75 0.67   Liver Panel Recent Labs    01/18/21 0517 01/19/21 0545  PROT 5.5* 6.3*  ALBUMIN 2.8* 3.6  AST 94* 76*  ALT 78* 70*  ALKPHOS 42 44  BILITOT 0.7 0.9   Sedimentation Rate Recent Labs    01/16/21 1800  ESRSEDRATE 3   C-Reactive Protein Recent Labs    01/16/21 1800  CRP 1.3*    Microbiology: reviewed Studies/Results: DG Abd 1 View  Result Date: 01/19/2021 CLINICAL DATA:  NG tube placement EXAM: ABDOMEN - 1 VIEW COMPARISON:  01/13/2021 FINDINGS: Feeding tube is in place with the tip in the distal stomach. Right PICC line tip is in the right atrium. Nonobstructive bowel gas pattern. No organomegaly or free air. IMPRESSION: Feeding tube tip in the distal stomach. Electronically Signed   By: Rolm Baptise M.D.   On: 01/19/2021 08:21   DG CHEST PORT 1 VIEW  Result Date: 01/17/2021 CLINICAL DATA:  History of diabetes and known bladder mass EXAM: PORTABLE CHEST 1 VIEW COMPARISON:  01/15/2021 FINDINGS: Cardiac shadow is stable.  Feeding catheter extends into the stomach. Right-sided PICC line is noted the cavoatrial junction stable from the prior exam. Bilateral pleural effusions are again seen. Left retrocardiac consolidation is noted as well. No significant vascular congestion is seen. IMPRESSION: Bilateral effusions and left retrocardiac opacity. Tubes and lines as described. Electronically Signed   By: Inez Catalina M.D.   On: 01/17/2021 21:13     Assessment/Plan: Aseptic/viral meningitis = will send remaining csf to do PCR multiplex to see if any other viral infection other than HSV or enterovirus  - continue on empiric doxycycline. - awaiting RMSF serology - slowly improving  [facility] for Infectious Diseases Cell:  [phone] Pager: [phone]  01/19/2021, 4:40 PM

## 2021-01-19 NOTE — Progress Notes (Signed)
Upon AM assessment, patient's feeding tube was withdrawn several cm from prior markings. Tube feeds were immediately stopped by this RN. RN was able to advance tube to original measurement and KUB confirmed placement in stomach. Tube feedings restarted by this RN. Will continue to carefully monitor pt.

## 2021-01-20 DIAGNOSIS — R6520 Severe sepsis without septic shock: Secondary | ICD-10-CM | POA: Diagnosis not present

## 2021-01-20 DIAGNOSIS — M549 Dorsalgia, unspecified: Secondary | ICD-10-CM

## 2021-01-20 DIAGNOSIS — A419 Sepsis, unspecified organism: Secondary | ICD-10-CM | POA: Diagnosis not present

## 2021-01-20 LAB — GLUCOSE, CAPILLARY
Glucose-Capillary: 152 mg/dL — ABNORMAL HIGH (ref 70–99)
Glucose-Capillary: 154 mg/dL — ABNORMAL HIGH (ref 70–99)
Glucose-Capillary: 165 mg/dL — ABNORMAL HIGH (ref 70–99)
Glucose-Capillary: 171 mg/dL — ABNORMAL HIGH (ref 70–99)
Glucose-Capillary: 171 mg/dL — ABNORMAL HIGH (ref 70–99)
Glucose-Capillary: 216 mg/dL — ABNORMAL HIGH (ref 70–99)

## 2021-01-20 LAB — COMPREHENSIVE METABOLIC PANEL
ALT: 54 U/L — ABNORMAL HIGH (ref 0–44)
AST: 48 U/L — ABNORMAL HIGH (ref 15–41)
Albumin: 3.1 g/dL — ABNORMAL LOW (ref 3.5–5.0)
Alkaline Phosphatase: 40 U/L (ref 38–126)
Anion gap: 6 (ref 5–15)
BUN: 15 mg/dL (ref 8–23)
CO2: 22 mmol/L (ref 22–32)
Calcium: 7.6 mg/dL — ABNORMAL LOW (ref 8.9–10.3)
Chloride: 109 mmol/L (ref 98–111)
Creatinine, Ser: 0.51 mg/dL — ABNORMAL LOW (ref 0.61–1.24)
GFR, Estimated: >60 mL/min (ref >60)
Glucose, Bld: 147 mg/dL — ABNORMAL HIGH (ref 70–99)
Potassium: 3.7 mmol/L (ref 3.5–5.1)
Sodium: 137 mmol/L (ref 135–145)
Total Bilirubin: 0.8 mg/dL (ref 0.3–1.2)
Total Protein: 5.7 g/dL — ABNORMAL LOW (ref 6.5–8.1)

## 2021-01-20 LAB — PHOSPHORUS: Phosphorus: 2.7 mg/dL (ref 2.5–4.6)

## 2021-01-20 LAB — CBC WITH DIFFERENTIAL/PLATELET
Abs Immature Granulocytes: 0.07 10*3/uL (ref 0.00–0.07)
Basophils Absolute: 0 10*3/uL (ref 0.0–0.1)
Basophils Relative: 1 %
Eosinophils Absolute: 0.1 10*3/uL (ref 0.0–0.5)
Eosinophils Relative: 1 %
HCT: 29 % — ABNORMAL LOW (ref 39.0–52.0)
Hemoglobin: 9.6 g/dL — ABNORMAL LOW (ref 13.0–17.0)
Immature Granulocytes: 1 %
Lymphocytes Relative: 46 %
Lymphs Abs: 3.9 10*3/uL (ref 0.7–4.0)
MCH: 30.8 pg (ref 26.0–34.0)
MCHC: 33.1 g/dL (ref 30.0–36.0)
MCV: 92.9 fL (ref 80.0–100.0)
Monocytes Absolute: 0.8 10*3/uL (ref 0.1–1.0)
Monocytes Relative: 10 %
Neutro Abs: 3.3 10*3/uL (ref 1.7–7.7)
Neutrophils Relative %: 41 %
Platelets: 274 10*3/uL (ref 150–400)
RBC: 3.12 MIL/uL — ABNORMAL LOW (ref 4.22–5.81)
RDW: 15.1 % (ref 11.5–15.5)
WBC: 8.2 10*3/uL (ref 4.0–10.5)
nRBC: 0 % (ref 0.0–0.2)

## 2021-01-20 LAB — CSF CULTURE W GRAM STAIN: Culture: NO GROWTH

## 2021-01-20 LAB — MAGNESIUM: Magnesium: 2 mg/dL (ref 1.7–2.4)

## 2021-01-20 MED ORDER — ALBUMIN HUMAN 25 % IV SOLN
25.0000 g | Freq: Four times a day (QID) | INTRAVENOUS | Status: DC
  Administered 2021-01-20 – 2021-01-21 (×5): 25 g via INTRAVENOUS
  Filled 2021-01-20 (×5): qty 100

## 2021-01-20 MED ORDER — GLUCERNA 1.5 CAL PO LIQD
1000.0000 mL | ORAL | Status: DC
  Administered 2021-01-20 – 2021-01-21 (×3): 1000 mL
  Filled 2021-01-20 (×3): qty 1000

## 2021-01-20 NOTE — Progress Notes (Signed)
Nutrition Follow-up  DOCUMENTATION CODES:   Not applicable  INTERVENTION:  - advance Glucerna 1.5 to 55 ml/hr today at 1400 and then to 65 ml/hr on 7/13 at 1400. - continue 100 ml free water every 4 hours. - monitor for diet advancement.   NUTRITION DIAGNOSIS:   Increased nutrient needs related to acute illness as evidenced by estimated needs. -ongoing  GOAL:   Patient will meet greater than or equal to 90% of their needs -unmet with current TF rate and patient continuing to be NPO.   MONITOR:   TF tolerance, Diet advancement, Labs, Weight trends  ASSESSMENT:   75 y.o. male with history of DM and BPH. He was experiencing perianal pain on 6/16 and was diagnosed with prostatitis and epididymitis. On 6/30 he went to the ED at [facility] with complaints of worsening pain and fever up to 104 degrees. He has been having increasing difficulty urinating with urine becoming increasingly dark.  He remains NPO at this time. Small bore NGT (gastric) in R nare.   TF initiated on 7/6. He is currently receiving Glucerna 1.5 @ 45 ml/hr with 100 ml free water every 4 hours. This regimen is providing 1620 kcal, 89 grams protein, and 1420 ml free water.   Goal TF regimen: Glucerna 1.5 @ 65 ml/hr with 100 ml free water every 4 hours. This regimen provides 2340 kcal, 129 grams protein, and 1784 ml free water.   Able to talk with RN who reports patient is tolerating current TF regimen well and that plan is for SLP evaluation for possible diet advancement d/t improvement in mentation today.   Weight trending down and now consistent with admission weight. IV lasix order in place. Non-pitting edema to BUE and mild pitting edema to BLE documented in the edema section of flow sheet.   Per notes: - NGT pulled out several cm, was re-advanced and placement confirmed with KUB, TF resumed - vomited a copious amount of TF on 7/10 at ~2030--TF held until 7/11 AM    Labs reviewed; CBGs: 171, 152, 171 mg/dl,  creatinine: 0.51 mg/dl, Ca: 7.6 mg/dl, LFTs elevated. Medications reviewed; 40 mg IV lasix/day, sliding scale novolog. IVF; NS @ 50 ml/hr.    Diet Order:   Diet Order             Diet NPO time specified Except for: Sips with Meds, Ice Chips  Diet effective now                   EDUCATION NEEDS:   No education needs have been identified at this time  Skin:  Skin Assessment: Reviewed RN Assessment  Last BM:  7/11 (type 7 x2)  Height:   Ht Readings from Last 1 Encounters:  01/12/21 6\' 3"  (1.905 m)    Weight:   Wt Readings from Last 1 Encounters:  01/20/21 101.7 kg      Estimated Nutritional Needs:  Kcal:  2350-2600 kcal Protein:  120-135 grams Fluid:  >/= 2.5 L/day      Jarome Matin, MS, RD, LDN, CNSC Inpatient Clinical Dietitian RD pager # available in [facility]  After hours/weekend pager # available in [facility]

## 2021-01-20 NOTE — Evaluation (Signed)
Physical Therapy Evaluation Patient Details Name: Joseph Banks MRN: [id number] DOB: [birth year] Today's Date: 01/20/2021   History of Present Illness  Patient is a 75 year old male admitted with severe sepsis, Viral encephalitis, A-fib with RVR. Recently went to [facility] ~1 month ago due to perianal pain, was diagnosed with prostatitis and epididymitis and given antibiotics. However, symptoms worsened with developing fever, suspected viral encephalitis  Clinical Impression  The patient  restless in bed, legs moving frequently, keeping eyes closed. Son and wife at bedside and report patient has light sensitivity. Patient gradually opened and kept eyes open longer. Speech is   not clear mostly with some  words sensible.  Patient required total assist of 2 to sit on bed edge and required max assist to  sit at bed edge, head leaning forward and  not holding erect. Required +3 for squat pivot to recliner. Patient  continued  with poor  head and trunk posture, tending to list to the  right. Improved eye opening after  transfer to recliner.  Patient will benefit from post acute rehab. Pt admitted with above diagnosis.  Pt currently with functional limitations due to the deficits listed below (see PT Problem List). Pt will benefit from skilled PT to increase their independence and safety with mobility to allow discharge to the venue listed below.  .   HR  and SPO2 on RA stable.  Follow Up Recommendations SNF (? CIR if improves)    Equipment Recommendations   (TBA)    Recommendations for Other Services       Precautions / Restrictions Precautions Precautions: Fall Precaution Comments: NG tube, fecal management system Restrictions Weight Bearing Restrictions: No      Mobility  Bed Mobility Overal bed mobility: Needs Assistance Bed Mobility: Supine to Sit     Supine to sit: Max assist;HOB elevated     General bed mobility comments: needing max cues to initiate bringing legs to edge of bed,  does try to assist with pushing R UE into bed to sit upright however needing max A for trunk control/posture due to anterior lean    Transfers Overall transfer level: Needs assistance Equipment used: None Transfers: Stand Pivot Transfers   Stand pivot transfers: Total assist       General transfer comment: RN present along with PT and OT with patient needing total A to pivot to recliner chair unable to support weight through LEs or extend knees. recommended lift back to bed with hoyer pad placed in chair  Ambulation/Gait                Stairs            Wheelchair Mobility    Modified Rankin (Stroke Patients Only)       Balance Overall balance assessment: Needs assistance Sitting-balance support: Feet supported Sitting balance-Leahy Scale: Zero Sitting balance - Comments: patient leaning  forward, head forward, unable to keep head upright. Postural control: Other (comment) (anterior lean) Standing balance support: Bilateral upper extremity supported Standing balance-Leahy Scale: Zero Standing balance comment: unable to support self, +3 assist                             Pertinent Vitals/Pain Pain Assessment: Faces Faces Pain Scale: Hurts even more Pain Location: rectum Pain Descriptors / Indicators: Grimacing;Discomfort Pain Intervention(s): Monitored during session;Repositioned    Home Living Family/patient expects to be discharged to:: Private residence Living Arrangements: Spouse/significant other Available  Help at Discharge: Family;Available 24 hours/day             Additional Comments: unable to fully obtain home situation due to amount of interactions inthe room with family and nursing and therapies.    Prior Function Level of Independence: Independent         Comments: per family patient recently back to [place] area from [place], appears active at baseline     Hand Dominance   Dominant Hand: Right    Extremity/Trunk  Assessment   Upper Extremity Assessment Upper Extremity Assessment: Generalized weakness    Lower Extremity Assessment Lower Extremity Assessment: Generalized weakness    Cervical / Trunk Assessment Cervical / Trunk Assessment: Other exceptions Cervical / Trunk Exceptions: significant leaning forward, decreased head control  Communication   Communication: Expressive difficulties  Cognition Arousal/Alertness: Lethargic Behavior During Therapy: Restless Overall Cognitive Status: Impaired/Different from baseline Area of Impairment: Orientation;Attention;Memory;Following commands;Safety/judgement;Problem solving;Awareness                 Orientation Level: Disoriented to;Place;Time;Situation;Person Current Attention Level: Focused Memory: Decreased short-term memory Following Commands: Follows one step commands inconsistently;Follows one step commands with increased time Safety/Judgement: Decreased awareness of safety;Decreased awareness of deficits Awareness: Intellectual Problem Solving: Slow processing;Decreased initiation;Difficulty sequencing;Requires verbal cues;Requires tactile cues General Comments: patient needing repetitive cues with inconsistent direction following, having difficulty initiating washing face without cloth placed in hand then needing cues for safety due to pulling at NG tube      General Comments      Exercises     Assessment/Plan    PT Assessment Patient needs continued PT services  PT Problem List Decreased strength;Decreased balance;Decreased cognition;Decreased knowledge of precautions;Decreased mobility;Decreased knowledge of use of DME;Decreased activity tolerance;Decreased safety awareness;Pain       PT Treatment Interventions DME instruction;Therapeutic activities;Cognitive remediation;Gait training;Therapeutic exercise;Patient/family education;Functional mobility training;Balance training    PT Goals (Current goals can be found in the  Care Plan section)  Acute Rehab PT Goals Patient Stated Goal: to chair PT Goal Formulation: With patient/family Time For Goal Achievement: 02/03/21 Potential to Achieve Goals: Fair    Frequency Min 2X/week   Barriers to discharge        Co-evaluation   Reason for Co-Treatment: Complexity of the patient's impairments (multi-system involvement);To address functional/ADL transfers;Necessary to address cognition/behavior during functional activity PT goals addressed during session: Mobility/safety with mobility OT goals addressed during session: ADL's and self-care       AM-PAC PT "6 Clicks" Mobility  Outcome Measure Help needed turning from your back to your side while in a flat bed without using bedrails?: Total Help needed moving from lying on your back to sitting on the side of a flat bed without using bedrails?: Total Help needed moving to and from a bed to a chair (including a wheelchair)?: Total Help needed standing up from a chair using your arms (e.g., wheelchair or bedside chair)?: Total Help needed to walk in hospital room?: Total Help needed climbing 3-5 steps with a railing? : Total 6 Click Score: 6    End of Session Equipment Utilized During Treatment: Gait belt Activity Tolerance: Patient limited by fatigue;Patient limited by lethargy Patient left: in chair;with call bell/phone within reach;with family/visitor present Nurse Communication: Mobility status;Need for lift equipment PT Visit Diagnosis: Unsteadiness on feet (R26.81);Difficulty in walking, not elsewhere classified (R26.2);Other abnormalities of gait and mobility (R26.89)    Time: 9211-9417 PT Time Calculation (min) (ACUTE ONLY): 35 min   Charges:   PT Evaluation $PT  Eval High Complexity: 1 High          Tresa Endo PT Acute Rehabilitation Services Pager [phone] Office [phone]   Claretha Cooper 01/20/2021, 12:44 PM

## 2021-01-20 NOTE — Evaluation (Signed)
Occupational Therapy Evaluation Patient Details Name: Joseph Banks MRN: [id number] DOB: [birth year] Today's Date: 01/20/2021    History of Present Illness Patient is a 75 year old male admitted with severe sepsis, Viral encephalitis, A-fib with RVR. Recently went to [facility] ~1 month ago due to perianal pain, was diagnosed with prostatitis and epididymitis and given antibiotics. However, symptoms worsened with developing fever   Clinical Impression   Patient requiring significant assistance for all ADL and mobility tasks due to impaired cognition, safety, strength, balance, and activity tolerance. Patient intermittently following 1 step directions needing repetitive cues throughout session. Would verbalize with therapy however difficult to understand at times with mumbled speech and also nonsensical statements. Could not accurately tell OT his birthday, was able to say "no" to being at home but could not state at the hospital. Overall patient needing max A for bed mobility and total A with RN, PT and OT present to perform pivot transfer to recliner. Patient unable to extend at knees or support weight requiring total assistance, recommend lift back to bed with pad in place. Recommend continued acute OT services to maximize patient safety and independence with self care, will need short term rehab at D/C from acute care and pending improved mentation could make good candidate for CIR.     Follow Up Recommendations  SNF;Other (comment) (vs CIR pending progress)    Equipment Recommendations  Other (comment) (TBD)       Precautions / Restrictions Precautions Precautions: Fall Precaution Comments: NG tube, fecal management system Restrictions Weight Bearing Restrictions: No      Mobility Bed Mobility Overal bed mobility: Needs Assistance Bed Mobility: Supine to Sit     Supine to sit: Max assist;HOB elevated     General bed mobility comments: needing max cues to initiate bringing legs to  edge of bed, does try to assist with pushing R UE into bed to sit upright however needing max A for trunk control/posture due to anterior lean    Transfers Overall transfer level: Needs assistance Equipment used: None Transfers: Stand Pivot Transfers   Stand pivot transfers: Total assist (+3)       General transfer comment: RN present along with PT and OT with patient needing total A to pivot to recliner chair unable to support weight through LEs or extend knees. recommended lift back to bed with hoyer pad placed in chair    Balance Overall balance assessment: Needs assistance Sitting-balance support: Feet supported Sitting balance-Leahy Scale: Poor Sitting balance - Comments: reliant on UE support and up to mod A Postural control: Other (comment) (anterior lean) Standing balance support: Bilateral upper extremity supported Standing balance-Leahy Scale: Zero Standing balance comment: unable to support self, +3 assist                           ADL either performed or assessed with clinical judgement   ADL Overall ADL's : Needs assistance/impaired Eating/Feeding: NPO   Grooming: Moderate assistance;Sitting;Bed level   Upper Body Bathing: Maximal assistance;Sitting Upper Body Bathing Details (indicate cue type and reason): poor/unsafe balance Lower Body Bathing: Total assistance   Upper Body Dressing : Maximal assistance;Bed level;Sitting   Lower Body Dressing: Total assistance;Sitting/lateral leans;Bed level   Toilet Transfer: Total assistance;Stand-pivot Toilet Transfer Details (indicate cue type and reason): total A with RN, PT and OT all present for line management and physical assistance due to patient inability to support weight or extend at knees. Toileting- Clothing Manipulation and  Hygiene: Total assistance;Bed level       Functional mobility during ADLs: Total assistance;+2 for physical assistance;+2 for safety/equipment General ADL Comments: patient  presenting with impaired cognition, significant weakness, impaired balance and limited activity tolerance impacting overall independence with self care needing significant assist for all ADLs at this time      Pertinent Vitals/Pain Pain Assessment: Faces Faces Pain Scale: Hurts even more Pain Location: rectum Pain Descriptors / Indicators: Grimacing Pain Intervention(s): Monitored during session     Hand Dominance Right   Extremity/Trunk Assessment Upper Extremity Assessment Upper Extremity Assessment: Generalized weakness       Cervical / Trunk Assessment Cervical / Trunk Assessment: Kyphotic   Communication Communication Communication: Expressive difficulties   Cognition Arousal/Alertness: Awake/alert (however keeping eyes closed due to photosensitivity) Behavior During Therapy: Restless (note patient psuhing legs and lightly kicking in bed upon arrival) Overall Cognitive Status: Impaired/Different from baseline Area of Impairment: Orientation;Attention;Memory;Following commands;Safety/judgement;Problem solving;Awareness                 Orientation Level: Disoriented to;Place;Time;Situation;Person (could not state his birthday) Current Attention Level: Focused;Sustained Memory: Decreased short-term memory Following Commands: Follows one step commands inconsistently;Follows one step commands with increased time Safety/Judgement: Decreased awareness of safety;Decreased awareness of deficits Awareness: Intellectual Problem Solving: Slow processing;Decreased initiation;Difficulty sequencing;Requires verbal cues;Requires tactile cues General Comments: patient needing repetitive cues with inconsistent direction following, having difficulty initiating washing face without cloth placed in hand then needing cues for safety due to pulling at NG tube              Home Living Family/patient expects to be discharged to:: Skilled nursing facility Living Arrangements:  Spouse/significant other                                      Prior Functioning/Environment          Comments: per family patient recently back to [place] area from [place], appears active at baseline        OT Problem List: Decreased strength;Decreased activity tolerance;Impaired balance (sitting and/or standing);Decreased cognition;Decreased coordination;Decreased safety awareness;Decreased knowledge of use of DME or AE;Pain      OT Treatment/Interventions: Self-care/ADL training;Therapeutic exercise;Neuromuscular education;DME and/or AE instruction;Therapeutic activities;Cognitive remediation/compensation;Patient/family education;Balance training    OT Goals(Current goals can be found in the care plan section) Acute Rehab OT Goals Patient Stated Goal: to chair OT Goal Formulation: With patient/family Time For Goal Achievement: 02/03/21 Potential to Achieve Goals: Good  OT Frequency: Min 2X/week           Co-evaluation PT/OT/SLP Co-Evaluation/Treatment: Yes Reason for Co-Treatment: To address functional/ADL transfers;For patient/therapist safety;Complexity of the patient's impairments (multi-system involvement) PT goals addressed during session: Mobility/safety with mobility OT goals addressed during session: ADL's and self-care      AM-PAC OT "6 Clicks" Daily Activity     Outcome Measure Help from another person eating meals?: Total (NPO) Help from another person taking care of personal grooming?: A Lot Help from another person toileting, which includes using toliet, bedpan, or urinal?: Total Help from another person bathing (including washing, rinsing, drying)?: A Lot Help from another person to put on and taking off regular upper body clothing?: A Lot Help from another person to put on and taking off regular lower body clothing?: Total 6 Click Score: 9   End of Session Equipment Utilized During Treatment: Gait belt Nurse Communication: Other (comment) (RN  present for  treatment)  Activity Tolerance: Patient limited by fatigue Patient left: in chair;with call bell/phone within reach;with chair alarm set;with family/visitor present;with nursing/sitter in room  OT Visit Diagnosis: Unsteadiness on feet (R26.81);Other abnormalities of gait and mobility (R26.89);Muscle weakness (generalized) (M62.81);Other symptoms and signs involving cognitive function;Pain Pain - part of body:  (buttock)                Time: 9381-8299 OT Time Calculation (min): 31 min Charges:  OT General Charges $OT Visit: 1 Visit OT Evaluation $OT Eval Moderate Complexity: [place] OT OT pager: [place] 01/20/2021, 12:03 PM

## 2021-01-20 NOTE — Progress Notes (Signed)
[facility] for Infectious Disease    Date of Admission:  01/12/2021   Total days of antibiotics 9/          ID: Joseph Banks is a 75 y.o. 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 Still slow to respond though afebrile Active Problems:   Acute lower UTI   Diabetes mellitus type 2 in nonobese (HCC)   Elevated LFTs   AKI (acute kidney injury) ([facility])   Atrial fibrillation with RVR (HCC)   BPH (benign prostatic hyperplasia)   Dysphagia   Viral encephalitis   Back pain    Subjective: Afebrile, able to answer questions earlier in the day, transferred to chair.  Medications:   chlorhexidine  15 mL Mouth Rinse BID   Chlorhexidine Gluconate Cloth  6 each Topical Q0600   finasteride  5 mg Oral Daily   free water  100 mL Per Tube Q4H   furosemide  40 mg Intravenous Daily   gabapentin  600 mg Per Tube BID   insulin aspart  0-6 Units Subcutaneous Q4H   lidocaine  1 patch Transdermal Q24H   mouth rinse  15 mL Mouth Rinse BID   mouth rinse  15 mL Mouth Rinse q12n4p   sodium chloride flush  10-40 mL Intracatheter Q12H    Objective: Vital signs in last 24 hours: Temp:  [98 F (36.7 C)-98.6 F (37 C)] 98 F (36.7 C) (07/12 1200) Pulse Rate:  [79-130] 85 (07/12 1500) Resp:  [15-24] 15 (07/12 1500) BP: (125-183)/(73-98) 152/90 (07/12 1500) SpO2:  [92 %-97 %] 96 % (07/12 1500) Weight:  [101.7 kg] 101.7 kg (07/12 9326) Physical Exam  Constitutional: He is oriented to person, only. He appears well-developed and well-nourished. No distress.  HENT:  Mouth/Throat: Oropharynx is clear and moist. No oropharyngeal exudate. Ng in place Cardiovascular: Normal rate, regular rhythm and normal heart sounds. Exam reveals no gallop and no friction rub.  No murmur heard.  Pulmonary/Chest: Effort normal and breath sounds normal. No respiratory distress. He has no wheezes.   Abdominal: Soft. Bowel sounds are normal. He exhibits no distension. There is no tenderness. Foley, and rectal tube in place Lymphadenopathy:  He has no cervical adenopathy.  Neurological: He is alert and oriented to person, place, and time.  Skin: Skin is warm and dry. No rash noted. No erythema.  Psychiatric: He has a normal mood and affect. His behavior is normal.    Lab Results Recent Labs    01/19/21 0545 01/20/21 0539  WBC 8.3 8.2  HGB 9.7* 9.6*  HCT 28.7* 29.0*  NA 137 137  K 4.1 3.7  CL 104 109  CO2 27 22  BUN 17 15  CREATININE 0.67 0.51*   Liver Panel Recent Labs    01/19/21 0545 01/20/21 0539  PROT 6.3* 5.7*  ALBUMIN 3.6 3.1*  AST 76* 48*  ALT 70* 54*  ALKPHOS 44 40  BILITOT 0.9 0.8   Lab Results  Component Value Date   ESRSEDRATE 3 01/16/2021    Microbiology: reviewed Studies/Results: DG Abd 1 View  Result Date: 01/19/2021 CLINICAL DATA:  NG tube placement EXAM: ABDOMEN - 1 VIEW COMPARISON:  01/13/2021 FINDINGS: Feeding tube is in place with the tip in the distal stomach. Right PICC line tip is in the right atrium. Nonobstructive bowel gas pattern. No organomegaly or free air. IMPRESSION: Feeding tube tip in the distal stomach. Electronically Signed  By: Rolm Baptise M.D.   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 Also today sent remaining tube for CSF pcr multiplex to [facility]. Continue on doxy empirically. VZV negative  Transaminitis = improving  Encephalopathy = not significantly better despite 4th day of IV acyclovir. Will get brain mri with contrast to evaluate. If not significantly improved. Recommend to repeat LP to send for repeat cell count, paraneoplastic, anti-NMDA receptor encephalitis  [facility] for Infectious Diseases Cell: [phone] Pager: [phone]  01/20/2021, 3:44 PM

## 2021-01-20 NOTE — Progress Notes (Signed)
Progress Note    Joseph Banks   VZC:[id number]  DOB: 1945/12/30  DOA: 01/12/2021     8  PCP: Glenda Chroman, MD  CC: perianal pain  Hospital Course: Joseph Banks is a 75 y.o. male with history of diabetes mellitus who has been experiencing perianal pain and had been to Dr. Jeffie Pollock urologist on 12/25/20; at that time was diagnosed with prostatitis and epididymitis was given 1 shot of ceftriaxone and was placed on Bactrim.  On 01/09/32 patient went to the ER at [facility] with complaints of worsening pain and fever 104.  At the time patient had a CT scan and also was placed on ciprofloxacin which he was on for 4 days PTA but having worsening fever.  He also began developing more difficulty urinating with darkening urine.   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  He was started on empiric antibiotics. Furthermore, he also developed A. fib with RVR which required initial rate control with Cardizem which was able to be transitioned to PRN lopressor.  He continued to remain encephalopathic with intermittent fevers.  Further work-up was commenced. See below for further problem based plan.   Interval History:  Slowly but steadily improving each day.  He is able to squeeze my fingers, raise arms, open eyes, wiggle toes.  He could not do this a few days ago. Now the hope is for further mentation improvement and trying to get him out of bed some as well as start some swallow evaluations to see if and when he can eat.  ROS: Review of systems not obtained due to patient factors. Cognitive impairment   Assessment & Plan: * Severe sepsis (HCC)-resolved as of 01/20/2021 - Fever, tachycardia, lactic acidosis.  Source presumed prostatitis on admission; after no significant improvement and no prostatic abscess noted on repeat MRI pelvis on 01/15/2021, further work-up was pursued (see encephalitis) - s/p foley placement by urology on  admission -Initially vanc/mero. Changed to CTX, amp, doxy on 7/7. ID also consulted at that time -Fevers persisted intermittently; have now defervesced - blood cultures remain negative from 7/4 and 7/6 - PICC placed 7/7 - differential had included prostatic abscess (ruled out on MRI on 7/7) vs other unidentified infection such as CNS (concerning some about recent trip to [facility] as well as persistent fevers, and rigid extremities/head/neck) - CT H/C/A/P also unrevealing but some motion artifact but grossly normal - underwent LP on 7/8 under conscious sedation -Rocephin and ampicillin discontinued by ID on 01/17/2021 after LP results - see encephalitis   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 Tube 1: WBC 32, seg neut 7%, Lymphs 91%, 1% eos Protein: 128 mg/dL, Glucose: 68 mg/dL - VZV negative - viral studies still pending (HSV, enterovirus); follow up send out too - diagnosis consistent given persistent AMS, photophobia, nuchal rigidity, ongoing agitation and CSF consistent with viral etiology - continue acyclovir - last documented fever: 01/15/21 @ 2054  Back pain - This is more than likely just positional from having laid in bed for approximately 8 days.  On exam on 01/18/2021, no spinal tenderness and no focal concerns.  Some red skin from sweating and laying in bed but no acute concerns when evaluated -Other considered differential would be seeded back infection versus prodrome of possible shingles outbreak (VZV negative on CSF however) - continue supportive care; PRN meds as  needed but not oversedate - ideally need him out of bed more as mentation clears   Dysphagia - SLP eval: Dysphagia level 3 diet ordered but too encephalopathic - NGT placed on 7/6 and TF started  - watch for TF tolerance; did have an episode of vomiting - continue SLP evals in efforts to start oral nutrition again  BPH (benign prostatic hyperplasia) - continue  flomax and finasteride per urology - continue foley per urology; likely outpatient TOV for cath removal  Atrial fibrillation with RVR ([place]) - likely precipitated by sepsis - TSH normal - use PRN lopressor  AKI (acute kidney injury) ([place]) - presumed from outlet obstruction; now s/p foley placed -Renal function has normalized - BMP daily - given UE edema and net pos ~ 8L (on 7/9) will start to diurese some with IV lasix. Noted he is also on acyclovir and NS currently - albumin also very low, will use IV alb while diuresing - large urine output since lasix/alb started (very good response); need to d/c lasix in the next 1-2 days  Thrombocytopenia (HCC)-resolved as of 01/18/2021 - For now presumed due to acute illness but see further workup above - Trend CBC - resolved on 7/10  Elevated LFTs - continues to improve  - most likely due to acute illness/sepsis  Diabetes mellitus type 2 in nonobese ([place]) - continue SSI and CBG monitoring   Old records reviewed in assessment of this patient  Antimicrobials: Vanc 7/4 >> 7/7 Meropenem 7/4 >> 7/7 Rocehin 7/7 >> 7/9 Ampicillin 7/7 >> 7/9  Doxy 7/7 >> current  Acyclovir 7/9 >> current   DVT prophylaxis: SCDs Start: 01/12/21 1736   Code Status:   Code Status: Full Code Family Communication: wife  Disposition Plan: Status is: Inpatient  Remains inpatient appropriate because:IV treatments appropriate due to intensity of illness or inability to take PO and Inpatient level of care appropriate due to severity of illness  Dispo: The patient is from: Home              Anticipated d/c is to:  pending PT eval              Patient currently is not medically stable to d/c.   Difficult to place patient No  Risk of unplanned readmission score: Unplanned Admission- Pilot do not use: 14.72   Objective: Blood pressure (!) 144/78, pulse (!) 130, temperature 98.3 F (36.8 C), temperature source Axillary, resp. rate (!) 21, height 6\' 3"  (1.905  m), weight 101.7 kg, SpO2 94 %.  Examination: General appearance:  Little more comfortable appearing in bed today and able to follow some commands.  Less agitation noted Head: Normocephalic, without obvious abnormality, atraumatic Neck: Will not allow me to passively move neck sideways or flexion Eyes:  Right eye pupil irregular which is chronic/baseline; left pupil is normal and reactive. Much less photosensitivity Lungs: clear to auscultation bilaterally Heart: irregularly irregular rhythm, S1, S2 normal, and tachycardic Abdomen: normal findings: bowel sounds normal and soft, non-tender Extremities:  edema has fully resolved in all 4 extremities Skin: mobility and turgor normal Neurologic: Squeezed my fingers with bilateral hands; raises arms; wiggles feet; opens eyes, spits out tongue  Consultants:  Urology Pulmonary/critical care - signed off ID IR  Procedures:    Data Reviewed: I have personally reviewed following labs and imaging studies Results for orders placed or performed during the hospital encounter of 01/12/21 (from the past 24 hour(s))  Glucose, capillary     Status: Abnormal  Collection Time: 01/19/21  4:42 PM  Result Value Ref Range   Glucose-Capillary 174 (H) 70 - 99 mg/dL   Comment 1 Notify RN    Comment 2 Document in Chart   Glucose, capillary     Status: Abnormal   Collection Time: 01/19/21  8:09 PM  Result Value Ref Range   Glucose-Capillary 152 (H) 70 - 99 mg/dL  Glucose, capillary     Status: Abnormal   Collection Time: 01/20/21 12:04 AM  Result Value Ref Range   Glucose-Capillary 171 (H) 70 - 99 mg/dL  Glucose, capillary     Status: Abnormal   Collection Time: 01/20/21  4:05 AM  Result Value Ref Range   Glucose-Capillary 152 (H) 70 - 99 mg/dL  Comprehensive metabolic panel     Status: Abnormal   Collection Time: 01/20/21  5:39 AM  Result Value Ref Range   Sodium 137 135 - 145 mmol/L   Potassium 3.7 3.5 - 5.1 mmol/L   Chloride 109 98 - 111 mmol/L    CO2 22 22 - 32 mmol/L   Glucose, Bld 147 (H) 70 - 99 mg/dL   BUN 15 8 - 23 mg/dL   Creatinine, Ser 0.51 (L) 0.61 - 1.24 mg/dL   Calcium 7.6 (L) 8.9 - 10.3 mg/dL   Total Protein 5.7 (L) 6.5 - 8.1 g/dL   Albumin 3.1 (L) 3.5 - 5.0 g/dL   AST 48 (H) 15 - 41 U/L   ALT 54 (H) 0 - 44 U/L   Alkaline Phosphatase 40 38 - 126 U/L   Total Bilirubin 0.8 0.3 - 1.2 mg/dL   GFR, Estimated >60 >60 mL/min   Anion gap 6 5 - 15  CBC with Differential/Platelet     Status: Abnormal   Collection Time: 01/20/21  5:39 AM  Result Value Ref Range   WBC 8.2 4.0 - 10.5 K/uL   RBC 3.12 (L) 4.22 - 5.81 MIL/uL   Hemoglobin 9.6 (L) 13.0 - 17.0 g/dL   HCT 29.0 (L) 39.0 - 52.0 %   MCV 92.9 80.0 - 100.0 fL   MCH 30.8 26.0 - 34.0 pg   MCHC 33.1 30.0 - 36.0 g/dL   RDW 15.1 11.5 - 15.5 %   Platelets 274 150 - 400 K/uL   nRBC 0.0 0.0 - 0.2 %   Neutrophils Relative % 41 %   Neutro Abs 3.3 1.7 - 7.7 K/uL   Lymphocytes Relative 46 %   Lymphs Abs 3.9 0.7 - 4.0 K/uL   Monocytes Relative 10 %   Monocytes Absolute 0.8 0.1 - 1.0 K/uL   Eosinophils Relative 1 %   Eosinophils Absolute 0.1 0.0 - 0.5 K/uL   Basophils Relative 1 %   Basophils Absolute 0.0 0.0 - 0.1 K/uL   WBC Morphology MILD LEFT SHIFT (1-5% METAS, OCC MYELO, OCC BANDS)    Immature Granulocytes 1 %   Abs Immature Granulocytes 0.07 0.00 - 0.07 K/uL   Reactive, Benign Lymphocytes PRESENT   Magnesium     Status: None   Collection Time: 01/20/21  5:39 AM  Result Value Ref Range   Magnesium 2.0 1.7 - 2.4 mg/dL  Phosphorus     Status: None   Collection Time: 01/20/21  5:39 AM  Result Value Ref Range   Phosphorus 2.7 2.5 - 4.6 mg/dL  Glucose, capillary     Status: Abnormal   Collection Time: 01/20/21  7:24 AM  Result Value Ref Range   Glucose-Capillary 171 (H) 70 - 99 mg/dL  Comment 1 Notify RN    Comment 2 Document in Chart   Glucose, capillary     Status: Abnormal   Collection Time: 01/20/21 12:32 PM  Result Value Ref Range   Glucose-Capillary  216 (H) 70 - 99 mg/dL   Comment 1 Notify RN    Comment 2 Document in Chart     Recent Results (from the past 240 hour(s))  Blood culture (routine single)     Status: None   Collection Time: 01/12/21 12:57 PM   Specimen: Right Antecubital; Blood  Result Value Ref Range Status   Specimen Description   Final    RIGHT ANTECUBITAL BOTTLES DRAWN AEROBIC AND ANAEROBIC   Special Requests Blood Culture adequate volume  Final   Culture   Final    NO GROWTH 5 DAYS Performed at [facility], [address], [place], [place] [address]    Report Status 01/17/2021 FINAL  Final  Urine culture     Status: Abnormal   Collection Time: 01/12/21  1:08 PM   Specimen: In/Out Cath Urine  Result Value Ref Range Status   Specimen Description   Final    IN/OUT CATH URINE Performed at [facility], [address] [place], [place] [address]    Special Requests   Final    NONE Performed at [facility], [address] [place], [place] [address]    Culture 1,000 COLONIES/mL STAPHYLOCOCCUS EPIDERMIDIS (A)  Final   Report Status 01/15/2021 FINAL  Final   Organism ID, Bacteria STAPHYLOCOCCUS EPIDERMIDIS (A)  Final      Susceptibility   Staphylococcus epidermidis - MIC*    CIPROFLOXACIN >=8 RESISTANT Resistant     GENTAMICIN <=0.5 SENSITIVE Sensitive     NITROFURANTOIN <=16 SENSITIVE Sensitive     OXACILLIN <=0.25 SENSITIVE Sensitive     TETRACYCLINE 2 SENSITIVE Sensitive     VANCOMYCIN 2 SENSITIVE Sensitive     TRIMETH/SULFA >=320 RESISTANT Resistant     CLINDAMYCIN <=0.25 SENSITIVE Sensitive     RIFAMPIN <=0.5 SENSITIVE Sensitive     Inducible Clindamycin NEGATIVE Sensitive     * 1,000 COLONIES/mL STAPHYLOCOCCUS EPIDERMIDIS  Resp Panel by RT-PCR (Flu A&B, Covid) Nasopharyngeal Swab     Status: None   Collection Time: 01/12/21  1:09 PM   Specimen: Nasopharyngeal Swab; Nasopharyngeal(NP) swabs in vial transport medium  Result Value Ref Range Status   SARS Coronavirus 2 by RT PCR NEGATIVE NEGATIVE Final     Comment: (NOTE) SARS-CoV-2 target nucleic acids are NOT DETECTED.  The SARS-CoV-2 RNA is generally detectable in upper respiratory specimens during the acute phase of infection. The lowest concentration of SARS-CoV-2 viral copies this assay can detect is 138 copies/mL. A negative result does not preclude SARS-Cov-2 infection and should not be used as the sole basis for treatment or other patient management decisions. A negative result may occur with  improper specimen collection/handling, submission of specimen other than nasopharyngeal swab, presence of viral mutation(s) within the areas targeted by this assay, and inadequate number of viral copies(<138 copies/mL). A negative result must be combined with clinical observations, patient history, and epidemiological information. The expected result is Negative.  Fact Sheet for Patients:  EntrepreneurPulse.com.au  Fact Sheet for Healthcare Providers:  IncredibleEmployment.be  This test is no t yet approved or cleared by the [place] FDA and  has been authorized for detection and/or diagnosis of SARS-CoV-2 by FDA under an Emergency Use Authorization (EUA). This EUA will remain  in effect (meaning this test can be used)  for the duration of the COVID-19 declaration under Section 564(b)(1) of the Act, 21 U.S.C.section 360bbb-3(b)(1), unless the authorization is terminated  or revoked sooner.       Influenza A by PCR NEGATIVE NEGATIVE Final   Influenza B by PCR NEGATIVE NEGATIVE Final    Comment: (NOTE) The Xpert Xpress SARS-CoV-2/FLU/RSV plus assay is intended as an aid in the diagnosis of influenza from Nasopharyngeal swab specimens and should not be used as a sole basis for treatment. Nasal washings and aspirates are unacceptable for Xpert Xpress SARS-CoV-2/FLU/RSV testing.  Fact Sheet for Patients: EntrepreneurPulse.com.au  Fact Sheet for Healthcare  Providers: IncredibleEmployment.be  This test is not yet approved or cleared by the [place] FDA and has been authorized for detection and/or diagnosis of SARS-CoV-2 by FDA under an Emergency Use Authorization (EUA). This EUA will remain in effect (meaning this test can be used) for the duration of the COVID-19 declaration under Section 564(b)(1) of the Act, 21 U.S.C. section 360bbb-3(b)(1), unless the authorization is terminated or revoked.  Performed at [facility], [address], [place], [place] [address]   MRSA Next Gen by PCR, Nasal     Status: None   Collection Time: 01/12/21  7:30 PM  Result Value Ref Range Status   MRSA by PCR Next Gen NOT DETECTED NOT DETECTED Final    Comment: (NOTE) The GeneXpert MRSA Assay (FDA approved for NASAL specimens only), is one component of a comprehensive MRSA colonization surveillance program. It is not intended to diagnose MRSA infection nor to guide or monitor treatment for MRSA infections. Test performance is not FDA approved in patients less than 61 years old. Performed at [facility], [place] [address], [place], [place] [address]   Culture, blood (single)     Status: None   Collection Time: 01/14/21  8:50 AM   Specimen: BLOOD LEFT HAND  Result Value Ref Range Status   Specimen Description   Final    BLOOD LEFT HAND Performed at [place] [address] [place], [place] [address]    Special Requests   Final    BOTTLES DRAWN AEROBIC ONLY Blood Culture adequate volume Performed at [place] [address], [place], [place] [address]    Culture   Final    NO GROWTH 5 DAYS Performed at [facility], [place] [address], [place], [place] [address]    Report Status 01/19/2021 FINAL  Final  CSF culture w Gram Stain     Status: None   Collection Time: 01/16/21  4:30 PM   Specimen: Cerebrospinal Fluid  Result Value Ref Range Status   Specimen  Description   Final    LUMBAR Performed at [place] [address] [place], [place] [address]    Special Requests   Final    NONE Performed at [facility], [place] [address], [place], [place] [address]    Gram Stain   Final    WBC PRESENT, PREDOMINANTLY MONONUCLEAR NO ORGANISMS SEEN CYTOSPIN SMEAR Gram Stain Report Called to,Read Back By and Verified With: S.WEAVER, RN AT 2153 ON 07.08.22 BY N.THOMPSON Performed at [place] [address] [place], [place] [address]    Culture   Final    NO GROWTH 3 DAYS Performed at [facility], [place] [address] [place], [place] [address]    Report Status 01/20/2021 FINAL  Final  Culture, fungus without smear     Status: None (  Preliminary result)   Collection Time: 01/16/21  4:30 PM   Specimen: Cerebrospinal Fluid  Result Value Ref Range Status   Specimen Description   Final    LUMBAR Performed at [place] [address] [place], [place] [address]    Special Requests   Final    NONE Performed at [facility], [place] [address], [place], [place] [address]    Culture   Final    No Fungi Isolated in 4 Weeks Performed at [facility], [place] [address] [place], [place] [address]    Report Status PENDING  Incomplete  Anaerobic culture w Gram Stain     Status: None (Preliminary result)   Collection Time: 01/16/21  4:30 PM   Specimen: Cerebrospinal Fluid  Result Value Ref Range Status   Specimen Description   Final    LUMBAR Performed at [place] [address] [place], [place] [address]    Special Requests   Final    NONE Performed at [facility], [place] [address] [place], [place] [address]    Gram Stain   Final    WBC PRESENT, PREDOMINANTLY MONONUCLEAR NO ORGANISMS SEEN CYTOSPIN SMEAR Performed at [facility], [place] [address] [place], [place] [address]    Culture    Final    NO ANAEROBES ISOLATED; CULTURE IN PROGRESS FOR 5 DAYS   Report Status PENDING  Incomplete     Radiology Studies: DG Abd 1 View  Result Date: 01/19/2021 CLINICAL DATA:  NG tube placement EXAM: ABDOMEN - 1 VIEW COMPARISON:  01/13/2021 FINDINGS: Feeding tube is in place with the tip in the distal stomach. Right PICC line tip is in the right atrium. Nonobstructive bowel gas pattern. No organomegaly or free air. IMPRESSION: Feeding tube tip in the distal stomach. Electronically Signed   By: Rolm Baptise M.D.   On: 01/19/2021 08:21   DG Abd 1 View  Final Result    DG CHEST PORT 1 VIEW  Final Result    IR Fluoro Guide Ndl Plmt / BX  Final Result    DG Chest Port 1 View  Final Result    CT HEAD WO CONTRAST  Final Result    CT CHEST ABDOMEN PELVIS W CONTRAST  Final Result    MR PELVIS WO CONTRAST  Final Result    [place] EKG SITE RITE  Final Result    DG CHEST PORT 1 VIEW  Final Result    DG Abd 1 View  Final Result    CT ABDOMEN PELVIS W CONTRAST  Final Result    DG Chest Port 1 View  Final Result      Scheduled Meds:  chlorhexidine  15 mL Mouth Rinse BID   Chlorhexidine Gluconate Cloth  6 each Topical Q0600   finasteride  5 mg Oral Daily   free water  100 mL Per Tube Q4H   furosemide  40 mg Intravenous Daily   gabapentin  600 mg Per Tube BID   insulin aspart  0-6 Units Subcutaneous Q4H   lidocaine  1 patch Transdermal Q24H   mouth rinse  15 mL Mouth Rinse BID   mouth rinse  15 mL Mouth Rinse q12n4p   sodium chloride flush  10-40 mL Intracatheter Q12H   PRN Meds: sodium chloride, acetaminophen (TYLENOL) oral liquid 160 mg/5 mL, acetaminophen, hyoscyamine, lip balm, LORazepam, metoprolol tartrate, morphine injection, ondansetron (ZOFRAN) IV, polyvinyl alcohol, sodium chloride flush Continuous Infusions:  sodium  chloride 10 mL/hr at 01/20/21 0011   sodium chloride 50 mL/hr at 01/20/21 0010   acyclovir Stopped (01/20/21 0720)   albumin human     doxycycline  (VIBRAMYCIN) IV Stopped (01/20/21 1220)   feeding supplement (GLUCERNA 1.5 CAL)       LOS: 8 days  Time spent: Greater than 50% of the 35 minute visit was spent in counseling/coordination of care for the patient as laid out in the A&P.   Dwyane Dee, MD [facility] 01/20/2021, 1:01 PM

## 2021-01-20 NOTE — Progress Notes (Signed)
  Speech Language Pathology Treatment: Dysphagia  Patient Details Name: Joseph Banks MRN: [id number] DOB: 05/05/1946 Today's Date: 01/20/2021 Time: 2694-8546 SLP Time Calculation (min) (ACUTE ONLY): 15 min  Assessment / Plan / Recommendation Clinical Impression  Pt sitting up in chair with wife present.  He was sleeping but did awaken to maximal verbal stimulation.  He requires verbal/visual cues to maintain LOA.  Oral cavity xerostomic with minimal white coating which SLP removed with oral suction kit and toothbrush/paste, toothette.  Pt then willing to consume single ice chips x3 and small bolus of jello with moderate to max cues.  Delayed swallow initiation clinically observed but NO Indication of airway compromise.  He does need verbal cues to cease talking and masticate ice chip or jello for airway protection.  Voice remains clear and strong, speech intelligiblity improves after oral care/intake.  Pt then politely declined to consume any more po intake.    Recommend pt be allowed single ice chips when fully alert and accepting to help with oral care, hygiene, decrease disuse muscle atrophy and for QOL.  Pt is not ready at this time for po diet - but anticipate as mentation improves, his swallow function will be intact.  Will follow briefly for po readiness.    Pt's son, Joseph Banks and pt's wife were educated to findings/recommendations using teach back and written instructions. Pt with reticence initially but with explanation for reasoning, he advised he was willing to allow family to examine oral cavity with flashlight to assess cleanliness for ice chip appropriateness.  Thanks.    HPI HPI: 75 yo male presented to emergency department with urinary hesitancy, dysuria on 01/12/2021.  He was treated for prostatitis with oral antibiotics.  Pt with PMH + for BPH, TURP,  DM.  Was found to have lactic acidosis, atrial fibrillation and concern for sepsis.  Progressive confusion noted with pt requiring  small bore feeding tube.   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  Also has h/o COVID per RN, DM and BPH.  Pt has partially removed small bore feeding tube twice in the last two days.  RN had to reposition.      SLP Plan  Continue with current plan of care       Recommendations  Diet recommendations:  (ice chips) Medication Administration: Other (Comment) (via feeding tube or can try with nectar liquids) Compensations: Slow rate;Small sips/bites;Other (Comment) (hand over hand assist to encourage pt to help give himself ice and/or provide oral care for neuro input-)                Oral Care Recommendations: Oral care BID Follow up Recommendations: None SLP Visit Diagnosis: Dysphagia, unspecified (R13.10) Plan: Continue with current plan of care       GO               Kathleen Lime, MS [place] Office [phone] Pager [phone]  Macario Golds 01/20/2021, 6:38 PM

## 2021-01-20 NOTE — Assessment & Plan Note (Signed)
-   This is more than likely just positional from having laid in bed for approximately 8 days.  On exam on 01/18/2021, no spinal tenderness and no focal concerns.  Some red skin from sweating and laying in bed but no acute concerns when evaluated -Other considered differential would be seeded back infection versus prodrome of possible shingles outbreak (VZV negative on CSF however) - continue supportive care; PRN meds as needed but not oversedate - ideally need him out of bed more as mentation clears

## 2021-01-21 ENCOUNTER — Inpatient Hospital Stay (HOSPITAL_COMMUNITY): Payer: Medicare HMO

## 2021-01-21 DIAGNOSIS — R6520 Severe sepsis without septic shock: Secondary | ICD-10-CM | POA: Diagnosis not present

## 2021-01-21 DIAGNOSIS — A419 Sepsis, unspecified organism: Secondary | ICD-10-CM | POA: Diagnosis not present

## 2021-01-21 LAB — GLUCOSE, CAPILLARY
Glucose-Capillary: 129 mg/dL — ABNORMAL HIGH (ref 70–99)
Glucose-Capillary: 151 mg/dL — ABNORMAL HIGH (ref 70–99)
Glucose-Capillary: 168 mg/dL — ABNORMAL HIGH (ref 70–99)
Glucose-Capillary: 173 mg/dL — ABNORMAL HIGH (ref 70–99)
Glucose-Capillary: 178 mg/dL — ABNORMAL HIGH (ref 70–99)
Glucose-Capillary: 190 mg/dL — ABNORMAL HIGH (ref 70–99)
Glucose-Capillary: 197 mg/dL — ABNORMAL HIGH (ref 70–99)

## 2021-01-21 LAB — CBC WITH DIFFERENTIAL/PLATELET
Abs Immature Granulocytes: 0.05 10*3/uL (ref 0.00–0.07)
Basophils Absolute: 0 10*3/uL (ref 0.0–0.1)
Basophils Relative: 0 %
Eosinophils Absolute: 0 10*3/uL (ref 0.0–0.5)
Eosinophils Relative: 1 %
HCT: 29.5 % — ABNORMAL LOW (ref 39.0–52.0)
Hemoglobin: 9.8 g/dL — ABNORMAL LOW (ref 13.0–17.0)
Immature Granulocytes: 1 %
Lymphocytes Relative: 42 %
Lymphs Abs: 3.2 10*3/uL (ref 0.7–4.0)
MCH: 31 pg (ref 26.0–34.0)
MCHC: 33.2 g/dL (ref 30.0–36.0)
MCV: 93.4 fL (ref 80.0–100.0)
Monocytes Absolute: 0.9 10*3/uL (ref 0.1–1.0)
Monocytes Relative: 12 %
Neutro Abs: 3.3 10*3/uL (ref 1.7–7.7)
Neutrophils Relative %: 44 %
Platelets: 339 10*3/uL (ref 150–400)
RBC: 3.16 MIL/uL — ABNORMAL LOW (ref 4.22–5.81)
RDW: 15.1 % (ref 11.5–15.5)
WBC: 7.5 10*3/uL (ref 4.0–10.5)
nRBC: 0 % (ref 0.0–0.2)

## 2021-01-21 LAB — COMPREHENSIVE METABOLIC PANEL
ALT: 45 U/L — ABNORMAL HIGH (ref 0–44)
AST: 35 U/L (ref 15–41)
Albumin: 4.1 g/dL (ref 3.5–5.0)
Alkaline Phosphatase: 42 U/L (ref 38–126)
Anion gap: 7 (ref 5–15)
BUN: 18 mg/dL (ref 8–23)
CO2: 25 mmol/L (ref 22–32)
Calcium: 9 mg/dL (ref 8.9–10.3)
Chloride: 104 mmol/L (ref 98–111)
Creatinine, Ser: 0.58 mg/dL — ABNORMAL LOW (ref 0.61–1.24)
GFR, Estimated: >60 mL/min (ref >60)
Glucose, Bld: 158 mg/dL — ABNORMAL HIGH (ref 70–99)
Potassium: 4 mmol/L (ref 3.5–5.1)
Sodium: 136 mmol/L (ref 135–145)
Total Bilirubin: 1 mg/dL (ref 0.3–1.2)
Total Protein: 6.9 g/dL (ref 6.5–8.1)

## 2021-01-21 LAB — PHOSPHORUS: Phosphorus: 3.2 mg/dL (ref 2.5–4.6)

## 2021-01-21 LAB — HSV 1/2 PCR, CSF
HSV-1 DNA: NEGATIVE
HSV-2 DNA: NEGATIVE

## 2021-01-21 LAB — ENTEROVIRUS PCR: Enterovirus PCR: NEGATIVE

## 2021-01-21 LAB — ROCKY MTN SPOTTED FVR ABS PNL(IGG+IGM)
RMSF IgG: NEGATIVE
RMSF IgM: 0.3 index (ref 0.00–0.89)

## 2021-01-21 LAB — CYTOLOGY - NON PAP

## 2021-01-21 LAB — MAGNESIUM: Magnesium: 2.2 mg/dL (ref 1.7–2.4)

## 2021-01-21 MED ORDER — GADOBUTROL 1 MMOL/ML IV SOLN
10.0000 mL | Freq: Once | INTRAVENOUS | Status: AC | PRN
  Administered 2021-01-21: 10 mL via INTRAVENOUS

## 2021-01-21 NOTE — Progress Notes (Signed)
  Speech Language Pathology Treatment: Dysphagia  Patient Details Name: Joseph Banks MRN: [id number] DOB: 1946/01/16 Today's Date: 01/21/2021 Time: 1715-1750 SLP Time Calculation (min) (ACUTE ONLY): 35 min  Assessment / Plan / Recommendation Clinical Impression  RN had requested SLP see pt today to determine readiness for po intake as he is requesting food.  He was sleeping in bed with his wife and daughter present. He continues with altered mentation but will participate in oral care, po trials with maximal verbal, tactile, verbal cues. Internal distractions impairing pt's sustained dattention evidenced by frequent adjustments in bed - reporting discomfort.  SLP repositioned him as best able with RN and using pillows to decrease lean.  Oral care provided using oral suction kit and toothbrush/paste.  He was slightly xerostomic initially.   Pt consumed ice chips, water, Dr Malachi Bonds, and pudding with moderate to max cues to help hand over hand and attend to task. He continues with mild clinical delayed swallow initiation and winces with Dr Malachi Bonds - ? some discomfort due to carbonation and feeding tube repositions? however NO Indication of airway compromise. Voice remains clear and strong, and pt's articulation is clear when his attention allows him to stay on topic.  After approximately 10 boluses total, pt politely declined further po offerings.    Recommend floor stock items be provided to pt when he is fully alert and accepting with strict aspiration precautions. Pt's current mentation does not allow nutrition via po alone.  Wife has been present for every SLP session and has been educated to aspiraton precautions with feeding, reporting comfort with plan.  Wife, daughter and pt educated to recommendations providing swallow precaution signs and teach back.    Anticipate, pt's swallow function will be adequate when mentation issue resolves and conditioning improves. Thanks.    HPI HPI: 75 yo male  presented to emergency department with urinary hesitancy, dysuria on 01/12/2021.  He was treated for prostatitis with oral antibiotics.  Pt with PMH + for BPH, TURP,  DM.  Was found to have lactic acidosis, atrial fibrillation and concern for sepsis.  Progressive confusion noted with pt requiring small bore feeding tube.   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  Also has h/o COVID per RN, DM and BPH.  He underwent brain MRI today which was normal.      SLP Plan  Continue with current plan of care       Recommendations  Diet recommendations:  (floor stock items) Liquids provided via: Cup;Straw Medication Administration: Other (Comment) (via feeding tube or can try with nectar liquids) Supervision: Full supervision/cueing for compensatory strategies Compensations: Slow rate;Small sips/bites;Other (Comment) (hand over hand assist to encourage pt to help give himself ice and/or provide oral care for neuro input-) Postural Changes and/or Swallow Maneuvers: Seated upright 90 degrees;Upright 30-60 min after meal                Oral Care Recommendations: Oral care BID Follow up Recommendations: None SLP Visit Diagnosis: Dysphagia, unspecified (R13.10) Plan: Continue with current plan of care       GO               Kathleen Lime, MS [place] Office [phone] Pager [phone]  Macario Golds 01/21/2021, 8:19 PM

## 2021-01-21 NOTE — Progress Notes (Signed)
ICU contacted twice last last night by Angelena Form MRI tech and once this am at 6:30 by me. Every time was told RN would call MRI back. No return phones calls have been made. MRI awaiting call back to try and set up MRI scan that was ordered yesterday.

## 2021-01-21 NOTE — Progress Notes (Signed)
[facility] for Infectious Disease    Date of Admission:  01/12/2021   Total days of antibiotics         Day 7 doxy        Day 5 acyclovir   ID: Joseph Banks is a 75 y.o. male with  ams 2/2 aseptic/viral meningitis Active Problems:   Acute lower UTI   Diabetes mellitus type 2 in nonobese (HCC)   Elevated LFTs   AKI (acute kidney injury) ([place])   Atrial fibrillation with RVR (HCC)   BPH (benign prostatic hyperplasia)   Dysphagia   Viral encephalitis   Back pain    Subjective: Afebrile, underwent brain mri which was normal-follows command, responds more questions accordingly  Medications:   chlorhexidine  15 mL Mouth Rinse BID   Chlorhexidine Gluconate Cloth  6 each Topical Q0600   finasteride  5 mg Oral Daily   free water  100 mL Per Tube Q4H   gabapentin  600 mg Per Tube BID   insulin aspart  0-6 Units Subcutaneous Q4H   lidocaine  1 patch Transdermal Q24H   mouth rinse  15 mL Mouth Rinse BID   sodium chloride flush  10-40 mL Intracatheter Q12H    Objective: Vital signs in last 24 hours: Temp:  [97.5 F (36.4 C)-98.5 F (36.9 C)] 98.5 F (36.9 C) (07/13 1530) Pulse Rate:  [80-97] 88 (07/13 1600) Resp:  [15-24] 22 (07/13 1600) BP: (132-190)/(59-94) 164/93 (07/13 0900) SpO2:  [90 %-96 %] 92 % (07/13 1600) Weight:  [97.4 kg] 97.4 kg (07/13 0500)   Physical Exam  Constitutional: He is oriented to person, place, and time. He appears well-developed and well-nourished. No distress.  HENT:  Mouth/Throat: Oropharynx is clear and moist. No oropharyngeal exudate.  Cardiovascular: Normal rate, regular rhythm and normal heart sounds. Exam reveals no gallop and no friction rub.  No murmur heard.  Pulmonary/Chest: Effort normal and breath sounds normal. No respiratory distress. He has no wheezes.  Abdominal: Soft. Bowel sounds are normal. He exhibits no distension. There is no tenderness.  Lymphadenopathy:  He has no cervical adenopathy.  Neurological: He is alert  and oriented to person, place, and time.  Skin: Skin is warm and dry. No rash noted. No erythema.  Psychiatric: He has a normal mood and affect. His behavior is normal.    Lab Results Recent Labs    01/20/21 0539 01/21/21 0623  WBC 8.2 7.5  HGB 9.6* 9.8*  HCT 29.0* 29.5*  NA 137 136  K 3.7 4.0  CL 109 104  CO2 22 25  BUN 15 18  CREATININE 0.51* 0.58*   Liver Panel Recent Labs    01/20/21 0539 01/21/21 0623  PROT 5.7* 6.9  ALBUMIN 3.1* 4.1  AST 48* 35  ALT 54* 45*  ALKPHOS 40 42  BILITOT 0.8 1.0    Microbiology: 7/8 csf ngtd Studies/Results: MR BRAIN W WO CONTRAST  Result Date: 01/21/2021 CLINICAL DATA:  Ongoing encephalopathy. Lymphocytic predominance on CSF. Viral meningitis. EXAM: MRI HEAD WITHOUT AND WITH CONTRAST TECHNIQUE: Multiplanar, multiecho pulse sequences of the brain and surrounding structures were obtained without and with intravenous contrast. CONTRAST:  78mL GADAVIST GADOBUTROL 1 MMOL/ML IV SOLN COMPARISON:  CT head without contrast 01/15/2021 FINDINGS: Brain: Study is mildly degraded patient motion. Periventricular white matter changes are within normal limits for age. No acute infarct, hemorrhage, or mass lesion is present. The ventricles are of normal size. No significant extraaxial fluid collection is present. The internal  auditory canals are within normal limits. The brainstem and cerebellum are within normal limits. Postcontrast images demonstrate no pathologic enhancement. Vascular: Flow is present in the major intracranial arteries. Skull and upper cervical spine: Degenerative changes are noted at C3-4. Decreased T1 marrow signal is present throughout. No focal lesions are present. Craniocervical junction is normal. Sinuses/Orbits: Banding noted on the right. Right lens replacement present. Globes and orbits are otherwise within normal limits. Paranasal sinuses are clear. There is some fluid in the mastoid air cells bilaterally. No obstructing  nasopharyngeal lesion is present. IMPRESSION: 1. Normal MRI appearance of the brain for age. No acute or focal lesion to explain the patient's symptoms. 2. Minimal fluid in the mastoid air cells bilaterally. No obstructing nasopharyngeal lesion is present. Electronically Signed   By: San Morelle M.D.   On: 01/21/2021 14:58     Assessment/Plan: Aseptic/viral meningitis = has lymphocytic predominant mild pleocytosis on initial tap. Awaiting HSV PCR and CSF multiplex-meningitis panel to see if can find etiology. If he is not improving significantly, may need to consider repeat CSF to send for para neoplastic or other non infectious process. For now it appears he is making some subtle improvements  Will stop empiric doxy today since he finishes 7 day course today. Initially placed for tickborne illness  Prior hx of uti/prostatis = treated and no longer problem for patient   [facility] for Infectious Diseases Cell: [phone] Pager: [phone]  01/21/2021, 4:22 PM

## 2021-01-21 NOTE — Progress Notes (Signed)
Joseph Banks  EVO:[id number] DOB: 01/03/1946 DOA: 01/12/2021 PCP: Glenda Chroman, MD    Brief Narrative:  75 year old with a history of DM who had recently been diagnosed with prostatitis treated with ceftriaxone and Bactrim but continued to experience worsening perianal pain with fever as high as 104.  He was therefore changed to ciprofloxacin but symptoms persisted.  CT at time of repeat presentation suggested a persistent bladder mass and enlarging prostate.  Significant Events:  Since admission the patient has been treated with antibiotic under the direction of the ID service.  His stay has been complicated by the development of new atrial fibrillation with RVR requiring medication titration.  He has remained persistently encephalopathic with intermittent fevers. 7/7 PICC line placement 7/8 LP under conscious sedation 7/13 MRI brain - no acute findings   Consultants:  Infectious Disease Urology PCCM IR  Code Status: FULL CODE  Antimicrobials:  Vanc 7/4 >> 7/7 Meropenem 7/4 >> 7/7 Rocehin 7/7 >> 7/9 Ampicillin 7/7 >> 7/9 Doxy 7/7 > Acyclovir 7/9 >  DVT prophylaxis: SCDs  Subjective: Afebrile.  Blood pressure elevated in 299-371 systolic range.  Saturations stable.  Heart rate controlled at 97.  Spoke with the patient's wife at bedside at length.  He responds to my greeting with a single word responses but otherwise is not able to interact.  He is mildly agitated.  Overall his wife feels that his mental status is very slowly improving.  Assessment & Plan:  Acute prostatitis with severe sepsis Sepsis now resolved -no prostatic abscess noted on MRI -Foley placed by Urology on admission - has now defervesced - PICC line placed 7/7  Viral encephalitis - aseptic meningitis Status post LP 7/8 - HSV and enterovirus studies still pending - symptoms to include AMS, photophobia, nuchal rigidity, agitation - continue empiric acyclovir -appears to very slowly be improving - MRI brain  today without acute findings   Back pain Felt to be due to prolonged bedrest -monitor closely as mental status improves  Dysphagia Dysphagia 3 diet per SLP but too encepahlopathic to eat - cont NG feeds until more alert -is currently expressing some interest in eating per his wife  BPH Continue Foley per Urology -will need outpatient follow-up for cath removal  Newly diagnosed atrial fibrillation with RVR Precipitated by sepsis -TSH normal -magnesium normal -NSR at time of exam today  Acute kidney injury Felt to be related to bladder outlet obstruction which is now resolved with Foley -renal function has normalized  Thrombocytopenia Resolved -due to sepsis  Transaminitis Likely shock liver - LFTs have normalized  DM2 CBG well controlled   Family Communication:  Status is: Inpatient  Remains inpatient appropriate because:Inpatient level of care appropriate due to severity of illness  Dispo: The patient is from: Home              Anticipated d/c is to: Home              Patient currently is medically stable to d/c.   Difficult to place patient No   Objective: Blood pressure (!) 190/68, pulse 97, temperature 98.5 F (36.9 C), temperature source Axillary, resp. rate 20, height 6\' 3"  (1.905 m), weight 97.4 kg, SpO2 96 %.  Intake/Output Summary (Last 24 hours) at 01/21/2021 0802 Last data filed at 01/21/2021 0741 Gross per 24 hour  Intake 3831.74 ml  Output 5825 ml  Net -1993.26 ml   Filed Weights   01/19/21 0652 01/20/21 0621 01/21/21 0500  Weight: 104.4 kg  101.7 kg 97.4 kg    Examination: General: No acute respiratory distress Lungs: Clear to auscultation bilaterally without wheezes or crackles Cardiovascular: Regular rate and rhythm without murmur gallop or rub normal S1 and S2 Abdomen: Nontender, nondistended, soft, bowel sounds positive, no rebound, no ascites, no appreciable mass Extremities: No significant cyanosis, clubbing, or edema bilateral lower  extremities  CBC: Recent Labs  Lab 01/19/21 0545 01/20/21 0539 01/21/21 0623  WBC 8.3 8.2 7.5  NEUTROABS 2.5 3.3 3.3  HGB 9.7* 9.6* 9.8*  HCT 28.7* 29.0* 29.5*  MCV 92.6 92.9 93.4  PLT 235 274 161   Basic Metabolic Panel: Recent Labs  Lab 01/19/21 0545 01/20/21 0539 01/21/21 0623  NA 137 137 136  K 4.1 3.7 4.0  CL 104 109 104  CO2 27 22 25   GLUCOSE 162* 147* 158*  BUN 17 15 18   CREATININE 0.67 0.51* 0.58*  CALCIUM 8.5* 7.6* 9.0  MG 2.3 2.0 2.2  PHOS 2.8 2.7 3.2   GFR: Estimated Creatinine Clearance: 96.8 mL/min (A) (by C-G formula based on SCr of 0.58 mg/dL (L)).  Liver Function Tests: Recent Labs  Lab 01/18/21 0517 01/19/21 0545 01/20/21 0539 01/21/21 0623  AST 94* 76* 48* 35  ALT 78* 70* 54* 45*  ALKPHOS 42 44 40 42  BILITOT 0.7 0.9 0.8 1.0  PROT 5.5* 6.3* 5.7* 6.9  ALBUMIN 2.8* 3.6 3.1* 4.1     Cardiac Enzymes: Recent Labs  Lab 01/15/21 2107  CKTOTAL 40*    HbA1C: No results found for: HGBA1C  CBG: Recent Labs  Lab 01/20/21 1635 01/20/21 1944 01/21/21 0016 01/21/21 0328 01/21/21 0714  GLUCAP 165* 154* 197* 129* 178*    Recent Results (from the past 240 hour(s))  Blood culture (routine single)     Status: None   Collection Time: 01/12/21 12:57 PM   Specimen: Right Antecubital; Blood  Result Value Ref Range Status   Specimen Description   Final    RIGHT ANTECUBITAL BOTTLES DRAWN AEROBIC AND ANAEROBIC   Special Requests Blood Culture adequate volume  Final   Culture   Final    NO GROWTH 5 DAYS Performed at [facility], [address] [place], [place] [address]    Report Status 01/17/2021 FINAL  Final  Urine culture     Status: Abnormal   Collection Time: 01/12/21  1:08 PM   Specimen: In/Out Cath Urine  Result Value Ref Range Status   Specimen Description   Final    IN/OUT CATH URINE Performed at [facility], [address], [place], [place] [address]    Special Requests   Final    NONE Performed at [facility],  [address], [place], [place] [address]    Culture 1,000 COLONIES/mL STAPHYLOCOCCUS EPIDERMIDIS (A)  Final   Report Status 01/15/2021 FINAL  Final   Organism ID, Bacteria STAPHYLOCOCCUS EPIDERMIDIS (A)  Final      Susceptibility   Staphylococcus epidermidis - MIC*    CIPROFLOXACIN >=8 RESISTANT Resistant     GENTAMICIN <=0.5 SENSITIVE Sensitive     NITROFURANTOIN <=16 SENSITIVE Sensitive     OXACILLIN <=0.25 SENSITIVE Sensitive     TETRACYCLINE 2 SENSITIVE Sensitive     VANCOMYCIN 2 SENSITIVE Sensitive     TRIMETH/SULFA >=320 RESISTANT Resistant     CLINDAMYCIN <=0.25 SENSITIVE Sensitive     RIFAMPIN <=0.5 SENSITIVE Sensitive     Inducible Clindamycin NEGATIVE Sensitive     * 1,000 COLONIES/mL STAPHYLOCOCCUS EPIDERMIDIS  Resp Panel by RT-PCR (Flu A&B, Covid)  Nasopharyngeal Swab     Status: None   Collection Time: 01/12/21  1:09 PM   Specimen: Nasopharyngeal Swab; Nasopharyngeal(NP) swabs in vial transport medium  Result Value Ref Range Status   SARS Coronavirus 2 by RT PCR NEGATIVE NEGATIVE Final    Comment: (NOTE) SARS-CoV-2 target nucleic acids are NOT DETECTED.  The SARS-CoV-2 RNA is generally detectable in upper respiratory specimens during the acute phase of infection. The lowest concentration of SARS-CoV-2 viral copies this assay can detect is 138 copies/mL. A negative result does not preclude SARS-Cov-2 infection and should not be used as the sole basis for treatment or other patient management decisions. A negative result may occur with  improper specimen collection/handling, submission of specimen other than nasopharyngeal swab, presence of viral mutation(s) within the areas targeted by this assay, and inadequate number of viral copies(<138 copies/mL). A negative result must be combined with clinical observations, patient history, and epidemiological information. The expected result is Negative.  Fact Sheet for Patients:  EntrepreneurPulse.com.au  Fact  Sheet for Healthcare Providers:  IncredibleEmployment.be  This test is no t yet approved or cleared by the [place] FDA and  has been authorized for detection and/or diagnosis of SARS-CoV-2 by FDA under an Emergency Use Authorization (EUA). This EUA will remain  in effect (meaning this test can be used) for the duration of the COVID-19 declaration under Section 564(b)(1) of the Act, 21 U.S.C.section 360bbb-3(b)(1), unless the authorization is terminated  or revoked sooner.       Influenza A by PCR NEGATIVE NEGATIVE Final   Influenza B by PCR NEGATIVE NEGATIVE Final    Comment: (NOTE) The Xpert Xpress SARS-CoV-2/FLU/RSV plus assay is intended as an aid in the diagnosis of influenza from Nasopharyngeal swab specimens and should not be used as a sole basis for treatment. Nasal washings and aspirates are unacceptable for Xpert Xpress SARS-CoV-2/FLU/RSV testing.  Fact Sheet for Patients: EntrepreneurPulse.com.au  Fact Sheet for Healthcare Providers: IncredibleEmployment.be  This test is not yet approved or cleared by the [place] FDA and has been authorized for detection and/or diagnosis of SARS-CoV-2 by FDA under an Emergency Use Authorization (EUA). This EUA will remain in effect (meaning this test can be used) for the duration of the COVID-19 declaration under Section 564(b)(1) of the Act, 21 U.S.C. section 360bbb-3(b)(1), unless the authorization is terminated or revoked.  Performed at [facility], [address], [place], [place] [address]   MRSA Next Gen by PCR, Nasal     Status: None   Collection Time: 01/12/21  7:30 PM  Result Value Ref Range Status   MRSA by PCR Next Gen NOT DETECTED NOT DETECTED Final    Comment: (NOTE) The GeneXpert MRSA Assay (FDA approved for NASAL specimens only), is one component of a comprehensive MRSA colonization surveillance program. It is not intended to diagnose MRSA infection  nor to guide or monitor treatment for MRSA infections. Test performance is not FDA approved in patients less than 31 years old. Performed at [facility], [place] [address], [place], [place] [address]   Culture, blood (single)     Status: None   Collection Time: 01/14/21  8:50 AM   Specimen: BLOOD LEFT HAND  Result Value Ref Range Status   Specimen Description   Final    BLOOD LEFT HAND Performed at [facility] [address], [place], [place] [address]    Special Requests   Final    BOTTLES DRAWN AEROBIC ONLY Blood Culture adequate volume  Performed at [facility], [place] [address] [place], [place] [address]    Culture   Final    NO GROWTH 5 DAYS Performed at [facility], [place] [address] [place], [place] [address]    Report Status 01/19/2021 FINAL  Final  CSF culture w Gram Stain     Status: None   Collection Time: 01/16/21  4:30 PM   Specimen: Cerebrospinal Fluid  Result Value Ref Range Status   Specimen Description   Final    LUMBAR Performed at [facility] [address], [place], [place] [address]    Special Requests   Final    NONE Performed at [facility], [place] [address] [place], [place] [address]    Gram Stain   Final    WBC PRESENT, PREDOMINANTLY MONONUCLEAR NO ORGANISMS SEEN CYTOSPIN SMEAR Gram Stain Report Called to,Read Back By and Verified With: S.WEAVER, RN AT 2153 ON 07.08.22 BY N.THOMPSON Performed at [facility] [address], [place], [place] [address]    Culture   Final    NO GROWTH 3 DAYS Performed at [facility], [place] [address], [place], [place] [address]    Report Status 01/20/2021 FINAL  Final  Culture, fungus without smear     Status: None (Preliminary result)   Collection Time: 01/16/21  4:30 PM   Specimen: Cerebrospinal Fluid  Result Value Ref Range Status   Specimen Description   Final    LUMBAR Performed  at [facility] [address], [place], [place] [address]    Special Requests   Final    NONE Performed at [facility], [place] [address], [place], [place] [address]    Culture   Final    No Fungi Isolated in 4 Weeks Performed at [facility], [place] [address], [place], [place] [address]    Report Status PENDING  Incomplete  Anaerobic culture w Gram Stain     Status: None (Preliminary result)   Collection Time: 01/16/21  4:30 PM   Specimen: Cerebrospinal Fluid  Result Value Ref Range Status   Specimen Description   Final    LUMBAR Performed at [facility] [address] [place], [place] [address]    Special Requests   Final    NONE Performed at [facility], [place] [address] [place], [place] [address]    Gram Stain   Final    WBC PRESENT, PREDOMINANTLY MONONUCLEAR NO ORGANISMS SEEN CYTOSPIN SMEAR Performed at [facility], [place] [address] [place],  [address]    Culture   Final    NO ANAEROBES ISOLATED; CULTURE IN PROGRESS FOR 5 DAYS   Report Status PENDING  Incomplete     Scheduled Meds:  chlorhexidine  15 mL Mouth Rinse BID   Chlorhexidine Gluconate Cloth  6 each Topical Q0600   finasteride  5 mg Oral Daily   free water  100 mL Per Tube Q4H   furosemide  40 mg Intravenous Daily   gabapentin  600 mg Per Tube BID   insulin aspart  0-6 Units Subcutaneous Q4H   lidocaine  1 patch Transdermal Q24H   mouth rinse  15 mL Mouth Rinse BID   mouth rinse  15 mL Mouth Rinse q12n4p   sodium chloride flush  10-40 mL Intracatheter Q12H   Continuous Infusions:  sodium chloride 10 mL/hr at 01/21/21 0741   sodium chloride 50 mL/hr  at 01/21/21 0741   acyclovir Stopped (01/21/21 0726)   albumin human Stopped (01/21/21 0343)   doxycycline (VIBRAMYCIN) IV Stopped (01/21/21 0054)   feeding supplement (GLUCERNA 1.5 CAL) 1,000 mL (01/21/21 0001)     LOS: 9 days   Cherene Altes,  MD [facility] Office  [phone] Pager - Text Page per Amion  If 7PM-7AM, please contact night-coverage per Amion 01/21/2021, 8:02 AM

## 2021-01-22 DIAGNOSIS — R6520 Severe sepsis without septic shock: Secondary | ICD-10-CM | POA: Diagnosis not present

## 2021-01-22 DIAGNOSIS — A419 Sepsis, unspecified organism: Secondary | ICD-10-CM | POA: Diagnosis not present

## 2021-01-22 LAB — VITAMIN B12: Vitamin B-12: 608 pg/mL (ref 180–914)

## 2021-01-22 LAB — IRON AND TIBC
Iron: 28 ug/dL — ABNORMAL LOW (ref 45–182)
Saturation Ratios: 15 % — ABNORMAL LOW (ref 17.9–39.5)
TIBC: 183 ug/dL — ABNORMAL LOW (ref 250–450)
UIBC: 155 ug/dL

## 2021-01-22 LAB — COMPREHENSIVE METABOLIC PANEL
ALT: 36 U/L (ref 0–44)
AST: 30 U/L (ref 15–41)
Albumin: 4.2 g/dL (ref 3.5–5.0)
Alkaline Phosphatase: 41 U/L (ref 38–126)
Anion gap: 6 (ref 5–15)
BUN: 17 mg/dL (ref 8–23)
CO2: 26 mmol/L (ref 22–32)
Calcium: 9.3 mg/dL (ref 8.9–10.3)
Chloride: 104 mmol/L (ref 98–111)
Creatinine, Ser: 0.59 mg/dL — ABNORMAL LOW (ref 0.61–1.24)
GFR, Estimated: >60 mL/min (ref >60)
Glucose, Bld: 148 mg/dL — ABNORMAL HIGH (ref 70–99)
Potassium: 4.2 mmol/L (ref 3.5–5.1)
Sodium: 136 mmol/L (ref 135–145)
Total Bilirubin: 1 mg/dL (ref 0.3–1.2)
Total Protein: 7.2 g/dL (ref 6.5–8.1)

## 2021-01-22 LAB — HSV 1/2 AB IGG/IGM CSF
HSV 1/2 Ab Screen IgG, CSF: 0.39 IV (ref <=0.89)
HSV 1/2 Ab, IgM, CSF: 0.44 IV (ref <=0.89)

## 2021-01-22 LAB — RETICULOCYTES
Immature Retic Fract: 29.4 % — ABNORMAL HIGH (ref 2.3–15.9)
RBC.: 3.2 MIL/uL — ABNORMAL LOW (ref 4.22–5.81)
Retic Count, Absolute: 106.2 10*3/uL (ref 19.0–186.0)
Retic Ct Pct: 3.3 % — ABNORMAL HIGH (ref 0.4–3.1)

## 2021-01-22 LAB — GLUCOSE, CAPILLARY
Glucose-Capillary: 158 mg/dL — ABNORMAL HIGH (ref 70–99)
Glucose-Capillary: 164 mg/dL — ABNORMAL HIGH (ref 70–99)
Glucose-Capillary: 165 mg/dL — ABNORMAL HIGH (ref 70–99)
Glucose-Capillary: 176 mg/dL — ABNORMAL HIGH (ref 70–99)
Glucose-Capillary: 200 mg/dL — ABNORMAL HIGH (ref 70–99)
Glucose-Capillary: 207 mg/dL — ABNORMAL HIGH (ref 70–99)

## 2021-01-22 LAB — MAGNESIUM: Magnesium: 2.3 mg/dL (ref 1.7–2.4)

## 2021-01-22 LAB — MISC LABCORP TEST (SEND OUT): Labcorp test code: 9985

## 2021-01-22 LAB — ANAEROBIC CULTURE W GRAM STAIN

## 2021-01-22 LAB — CBC
HCT: 29.8 % — ABNORMAL LOW (ref 39.0–52.0)
Hemoglobin: 10.1 g/dL — ABNORMAL LOW (ref 13.0–17.0)
MCH: 31.7 pg (ref 26.0–34.0)
MCHC: 33.9 g/dL (ref 30.0–36.0)
MCV: 93.4 fL (ref 80.0–100.0)
Platelets: 366 10*3/uL (ref 150–400)
RBC: 3.19 MIL/uL — ABNORMAL LOW (ref 4.22–5.81)
RDW: 15.5 % (ref 11.5–15.5)
WBC: 7 10*3/uL (ref 4.0–10.5)
nRBC: 0 % (ref 0.0–0.2)

## 2021-01-22 LAB — FOLATE: Folate: 13.6 ng/mL (ref >5.9)

## 2021-01-22 LAB — AMMONIA: Ammonia: 14 umol/L (ref 9–35)

## 2021-01-22 LAB — PHOSPHORUS: Phosphorus: 3.3 mg/dL (ref 2.5–4.6)

## 2021-01-22 LAB — FERRITIN: Ferritin: 1275 ng/mL — ABNORMAL HIGH (ref 24–336)

## 2021-01-22 MED ORDER — GABAPENTIN 250 MG/5ML PO SOLN
300.0000 mg | Freq: Two times a day (BID) | ORAL | Status: DC
  Administered 2021-01-22 – 2021-01-23 (×2): 300 mg
  Filled 2021-01-22 (×2): qty 6

## 2021-01-22 MED ORDER — ACETAMINOPHEN 160 MG/5ML PO SOLN: 650.0000 mg | ORAL | Status: DC | PRN

## 2021-01-22 MED ORDER — GLUCERNA 1.5 CAL PO LIQD
1000.0000 mL | ORAL | Status: DC
  Administered 2021-01-22 (×2): 1000 mL
  Filled 2021-01-22 (×2): qty 1000

## 2021-01-22 MED ORDER — TRAMADOL HCL 50 MG PO TABS
50.0000 mg | ORAL_TABLET | Freq: Four times a day (QID) | ORAL | Status: DC | PRN
  Filled 2021-01-22 (×2): qty 1

## 2021-01-22 MED ORDER — MORPHINE SULFATE (PF) 2 MG/ML IV SOLN: 2.0000 mg | INTRAVENOUS | Status: DC | PRN

## 2021-01-22 MED ORDER — OXYCODONE HCL 5 MG PO TABS
5.0000 mg | ORAL_TABLET | ORAL | Status: DC | PRN
Start: 2021-01-22 — End: 2021-01-23
  Administered 2021-01-23 (×2): 5 mg
  Filled 2021-01-22 (×2): qty 1

## 2021-01-22 NOTE — Progress Notes (Signed)
Rehab Admissions Coordinator Note:  Patient was screened by Cleatrice Burke for appropriateness for an Inpatient Acute Rehab Consult per therapy recs.   At this time, we are recommending Inpatient Rehab consult per protocol. AN admissions Coordinator will follow up for full assessment.  Cleatrice Burke RN MSN 01/22/2021, 11:52 AM  I can be reached at [phone].

## 2021-01-22 NOTE — Progress Notes (Signed)
Joseph Banks  WUG:[id number] DOB: 05-21-1946 DOA: 01/12/2021 PCP: Glenda Chroman, MD    Brief Narrative:  75yo with a history of DM who had recently been diagnosed with prostatitis treated with ceftriaxone and bactrim but continued to experience worsening perianal pain with fever as high as 104. He was therefore changed to ciprofloxacin but symptoms persisted. CT at time of repeat presentation suggested a persistent bladder mass and enlarging prostate.  Significant Events:  Since admission the patient has been treated with antibiotic under the direction of the ID service.  His stay has been complicated by the development of new atrial fibrillation with RVR requiring medication titration.  He has remained persistently encephalopathic with intermittent fevers. 7/7 PICC line placement 7/8 LP under conscious sedation 7/13 MRI brain - no acute findings   Consultants:  Infectious Disease Urology PCCM IR  Code Status: FULL CODE  Antimicrobials:  Vanc 7/4 >> 7/7 Meropenem 7/4 >> 7/7 Rocehin 7/7 >> 7/9 Ampicillin 7/7 >> 7/9 Doxy 7/7 > 7/13 Acyclovir 7/9 >  DVT prophylaxis: SCDs  Subjective: Remains afebrile. HR trending upward. BP somewhat elevated. Making slow progress in regard to mental recovery. Initiated feeds from floor stock yesterday evening.   Assessment & Plan:  Acute prostatitis with severe sepsis Sepsis resolved -no prostatic abscess noted on MRI -Foley placed by Urology on admission - has now defervesced - PICC line placed 7/7  Viral encephalitis - aseptic meningitis - toxic metabolic encephalopathy Status post LP 7/8 - HSV and Enterovirus PCR negative therefore acyclovir being stopped today - symptoms to include AMS, photophobia, nuchal rigidity, agitation - very slowly improving - MRI brain 7/13 without acute findings - B12, folate, and ammonia normal   Back pain Felt to be due to prolonged bedrest - monitor closely as mental status improves  Dysphagia Dysphagia  3 diet per SLP but too encepahlopathic to eat - cont NG feeds until more alert - has been tolerating feeds from floor stock since 7/13 PM - advance diet further when more alert   BPH Continue Foley per Urology - will need outpatient follow-up for cath removal  Newly diagnosed atrial fibrillation with RVR Precipitated by sepsis -TSH normal - magnesium normal - NSR at time of exam today  Acute kidney injury Felt to be related to bladder outlet obstruction which is now resolved with Foley - renal function has normalized  Thrombocytopenia Resolved - due to sepsis  Transaminitis Likely shock liver - LFTs have normalized  DM2 CBG well controlled   Family Communication: Spoke with wife at bedside Status is: Inpatient  Remains inpatient appropriate because:Inpatient level of care appropriate due to severity of illness  Dispo: The patient is from: Home              Anticipated d/c is to: Home              Patient currently is medically stable to d/c.   Difficult to place patient No   Objective: Blood pressure (!) 160/76, pulse 96, temperature 98.7 F (37.1 C), temperature source Oral, resp. rate 17, height 6\' 3"  (1.905 m), weight 97.4 kg, SpO2 93 %.  Intake/Output Summary (Last 24 hours) at 01/22/2021 0832 Last data filed at 01/22/2021 0600 Gross per 24 hour  Intake 1006.17 ml  Output 4775 ml  Net -3768.83 ml    Filed Weights   01/19/21 0652 01/20/21 0621 01/21/21 0500  Weight: 104.4 kg 101.7 kg 97.4 kg    Examination: General: No acute respiratory distress Lungs:  clear to auscultation bilaterally without wheezing Cardiovascular: Mildly tachycardic but regular without murmur Abdomen: NT/ND, soft, BS positive Extremities: Trace bilateral lower extremity edema  CBC: Recent Labs  Lab 01/19/21 0545 01/20/21 0539 01/21/21 0623 01/22/21 0202  WBC 8.3 8.2 7.5 7.0  NEUTROABS 2.5 3.3 3.3  --   HGB 9.7* 9.6* 9.8* 10.1*  HCT 28.7* 29.0* 29.5* 29.8*  MCV 92.6 92.9 93.4  93.4  PLT 235 274 339 409    Basic Metabolic Panel: Recent Labs  Lab 01/20/21 0539 01/21/21 0623 01/22/21 0202  NA 137 136 136  K 3.7 4.0 4.2  CL 109 104 104  CO2 22 25 26   GLUCOSE 147* 158* 148*  BUN 15 18 17   CREATININE 0.51* 0.58* 0.59*  CALCIUM 7.6* 9.0 9.3  MG 2.0 2.2 2.3  PHOS 2.7 3.2 3.3    GFR: Estimated Creatinine Clearance: 96.8 mL/min (A) (by C-G formula based on SCr of 0.59 mg/dL (L)).  Liver Function Tests: Recent Labs  Lab 01/19/21 0545 01/20/21 0539 01/21/21 0623 01/22/21 0202  AST 76* 48* 35 30  ALT 70* 54* 45* 36  ALKPHOS 44 40 42 41  BILITOT 0.9 0.8 1.0 1.0  PROT 6.3* 5.7* 6.9 7.2  ALBUMIN 3.6 3.1* 4.1 4.2      Cardiac Enzymes: Recent Labs  Lab 01/15/21 2107  CKTOTAL 40*     HbA1C: No results found for: HGBA1C  CBG: Recent Labs  Lab 01/21/21 1526 01/21/21 1945 01/21/21 2305 01/22/21 0313 01/22/21 0745  GLUCAP 190* 151* 173* 165* 200*     Recent Results (from the past 240 hour(s))  Blood culture (routine single)     Status: None   Collection Time: 01/12/21 12:57 PM   Specimen: Right Antecubital; Blood  Result Value Ref Range Status   Specimen Description   Final    RIGHT ANTECUBITAL BOTTLES DRAWN AEROBIC AND ANAEROBIC   Special Requests Blood Culture adequate volume  Final   Culture   Final    NO GROWTH 5 DAYS Performed at [facility], [address], [place], [place] [address]    Report Status 01/17/2021 FINAL  Final  Urine culture     Status: Abnormal   Collection Time: 01/12/21  1:08 PM   Specimen: In/Out Cath Urine  Result Value Ref Range Status   Specimen Description   Final    IN/OUT CATH URINE Performed at [facility], [address], [place], [place] [address]    Special Requests   Final    NONE Performed at [facility], [address] [place], [place] [address]    Culture 1,000 COLONIES/mL STAPHYLOCOCCUS EPIDERMIDIS (A)  Final   Report Status 01/15/2021 FINAL  Final   Organism ID, Bacteria  STAPHYLOCOCCUS EPIDERMIDIS (A)  Final      Susceptibility   Staphylococcus epidermidis - MIC*    CIPROFLOXACIN >=8 RESISTANT Resistant     GENTAMICIN <=0.5 SENSITIVE Sensitive     NITROFURANTOIN <=16 SENSITIVE Sensitive     OXACILLIN <=0.25 SENSITIVE Sensitive     TETRACYCLINE 2 SENSITIVE Sensitive     VANCOMYCIN 2 SENSITIVE Sensitive     TRIMETH/SULFA >=320 RESISTANT Resistant     CLINDAMYCIN <=0.25 SENSITIVE Sensitive     RIFAMPIN <=0.5 SENSITIVE Sensitive     Inducible Clindamycin NEGATIVE Sensitive     * 1,000 COLONIES/mL STAPHYLOCOCCUS EPIDERMIDIS  Resp Panel by RT-PCR (Flu A&B, Covid) Nasopharyngeal Swab     Status: None   Collection Time: 01/12/21  1:09 PM   Specimen: Nasopharyngeal Swab;  Nasopharyngeal(NP) swabs in vial transport medium  Result Value Ref Range Status   SARS Coronavirus 2 by RT PCR NEGATIVE NEGATIVE Final    Comment: (NOTE) SARS-CoV-2 target nucleic acids are NOT DETECTED.  The SARS-CoV-2 RNA is generally detectable in upper respiratory specimens during the acute phase of infection. The lowest concentration of SARS-CoV-2 viral copies this assay can detect is 138 copies/mL. A negative result does not preclude SARS-Cov-2 infection and should not be used as the sole basis for treatment or other patient management decisions. A negative result may occur with  improper specimen collection/handling, submission of specimen other than nasopharyngeal swab, presence of viral mutation(s) within the areas targeted by this assay, and inadequate number of viral copies(<138 copies/mL). A negative result must be combined with clinical observations, patient history, and epidemiological information. The expected result is Negative.  Fact Sheet for Patients:  EntrepreneurPulse.com.au  Fact Sheet for Healthcare Providers:  IncredibleEmployment.be  This test is no t yet approved or cleared by the [place] FDA and  has been authorized  for detection and/or diagnosis of SARS-CoV-2 by FDA under an Emergency Use Authorization (EUA). This EUA will remain  in effect (meaning this test can be used) for the duration of the COVID-19 declaration under Section 564(b)(1) of the Act, 21 U.S.C.section 360bbb-3(b)(1), unless the authorization is terminated  or revoked sooner.       Influenza A by PCR NEGATIVE NEGATIVE Final   Influenza B by PCR NEGATIVE NEGATIVE Final    Comment: (NOTE) The Xpert Xpress SARS-CoV-2/FLU/RSV plus assay is intended as an aid in the diagnosis of influenza from Nasopharyngeal swab specimens and should not be used as a sole basis for treatment. Nasal washings and aspirates are unacceptable for Xpert Xpress SARS-CoV-2/FLU/RSV testing.  Fact Sheet for Patients: EntrepreneurPulse.com.au  Fact Sheet for Healthcare Providers: IncredibleEmployment.be  This test is not yet approved or cleared by the [place] FDA and has been authorized for detection and/or diagnosis of SARS-CoV-2 by FDA under an Emergency Use Authorization (EUA). This EUA will remain in effect (meaning this test can be used) for the duration of the COVID-19 declaration under Section 564(b)(1) of the Act, 21 U.S.C. section 360bbb-3(b)(1), unless the authorization is terminated or revoked.  Performed at [facility], [address] [place], [place] [address]   MRSA Next Gen by PCR, Nasal     Status: None   Collection Time: 01/12/21  7:30 PM  Result Value Ref Range Status   MRSA by PCR Next Gen NOT DETECTED NOT DETECTED Final    Comment: (NOTE) The GeneXpert MRSA Assay (FDA approved for NASAL specimens only), is one component of a comprehensive MRSA colonization surveillance program. It is not intended to diagnose MRSA infection nor to guide or monitor treatment for MRSA infections. Test performance is not FDA approved in patients less than 86 years old. Performed at [facility], [place] [address] [place], [place] [address]   Culture, blood (single)     Status: None   Collection Time: 01/14/21  8:50 AM   Specimen: BLOOD LEFT HAND  Result Value Ref Range Status   Specimen Description   Final    BLOOD LEFT HAND Performed at [place] [address], [place], [place] [address]    Special Requests   Final    BOTTLES DRAWN AEROBIC ONLY Blood Culture adequate volume Performed at [place] [address] [place], [place] [address]    Culture   Final  NO GROWTH 5 DAYS Performed at [facility], [place] [address], [place], [place] [address]    Report Status 01/19/2021 FINAL  Final  CSF culture w Gram Stain     Status: None   Collection Time: 01/16/21  4:30 PM   Specimen: Cerebrospinal Fluid  Result Value Ref Range Status   Specimen Description   Final    LUMBAR Performed at [place] [address] [place], [place] [address]    Special Requests   Final    NONE Performed at [facility], [place] [address], [place], [place] [address]    Gram Stain   Final    WBC PRESENT, PREDOMINANTLY MONONUCLEAR NO ORGANISMS SEEN CYTOSPIN SMEAR Gram Stain Report Called to,Read Back By and Verified With: S.WEAVER, RN AT 2153 ON 07.08.22 BY N.THOMPSON Performed at [place] [address] [place], [place] [address]    Culture   Final    NO GROWTH 3 DAYS Performed at [facility], [place] [address] [place], [place] [address]    Report Status 01/20/2021 FINAL  Final  Culture, fungus without smear     Status: None (Preliminary result)   Collection Time: 01/16/21  4:30 PM   Specimen: Cerebrospinal Fluid  Result Value Ref Range Status   Specimen Description   Final    LUMBAR Performed at [place] [address] [place], [place] [address]    Special Requests   Final    NONE Performed at [facility],  [place] [address] [place], [place] [address]    Culture   Final    NO FUNGUS ISOLATED AFTER 5 DAYS Performed at [facility], [place] [address], [place], [place] [address]    Report Status PENDING  Incomplete  Anaerobic culture w Gram Stain     Status: None (Preliminary result)   Collection Time: 01/16/21  4:30 PM   Specimen: Cerebrospinal Fluid  Result Value Ref Range Status   Specimen Description   Final    LUMBAR Performed at [place] [address] [place], [place] [address]    Special Requests   Final    NONE Performed at [facility], [place] [address], [place], [place] [address]    Gram Stain   Final    WBC PRESENT, PREDOMINANTLY MONONUCLEAR NO ORGANISMS SEEN CYTOSPIN SMEAR Performed at [facility], [place] [address] [place], [place] [address]    Culture   Final    NO ANAEROBES ISOLATED; CULTURE IN PROGRESS FOR 5 DAYS   Report Status PENDING  Incomplete      Scheduled Meds:  chlorhexidine  15 mL Mouth Rinse BID   Chlorhexidine Gluconate Cloth  6 each Topical Q0600   finasteride  5 mg Oral Daily   free water  100 mL Per Tube Q4H   gabapentin  600 mg Per Tube BID   insulin aspart  0-6 Units Subcutaneous Q4H   lidocaine  1 patch Transdermal Q24H   mouth rinse  15 mL Mouth Rinse BID   sodium chloride flush  10-40 mL Intracatheter Q12H   Continuous Infusions:  sodium chloride 50 mL/hr at 01/21/21 1636   acyclovir Stopped (01/22/21 0720)   feeding supplement (GLUCERNA 1.5 CAL) 1,000 mL (01/21/21 2309)     LOS: 10 days   Cherene Altes, MD [facility] Office  [phone] Pager - Text Page per Shea Evans  If 7PM-7AM, please contact  night-coverage per Amion 01/22/2021, 8:32 AM

## 2021-01-22 NOTE — TOC Progression Note (Signed)
Transition of Care [facility]) - Progression Note    Patient Details  Name: Joseph Banks MRN: [id number] Date of Birth: 12-Apr-1946  Transition of Care [facility] CM/SW Contact  Deshon Koslowski, Juliann Pulse, RN Phone Number: 01/22/2021, 3:46 PM  Clinical Narrative:  CIR following for assessment if qualifies.     Expected Discharge Plan: IP Rehab Facility Barriers to Discharge: Continued Medical Work up  Expected Discharge Plan and Services Expected Discharge Plan: [place]   Discharge Planning Services: CM Consult   Living arrangements for the past 2 months: Single Family Home                                       Social Determinants of Health (SDOH) Interventions    Readmission Risk Interventions No flowsheet data found.

## 2021-01-22 NOTE — Progress Notes (Signed)
  Speech Language Pathology Treatment: Dysphagia  Patient Details Name: Joseph Banks MRN: [id number] DOB: Mar 13, 1946 Today's Date: 01/22/2021 Time: 1840-1905 SLP Time Calculation (min) (ACUTE ONLY): 25 min  Assessment / Plan / Recommendation Clinical Impression  Pt seen today to determine readiness for to transition into po diet - meal trays, etc, assess tolerance of floor stock items and educate pt/family to precautions.   RN reports pt fed self applesauce and pudding today with good tolerance.   Pt's mentation continues to improve with decrease in latency to SLP questions and he is able to feed himself soda and sandwich.  He is social and thus tends to talk while both solid and liquid bolus is retained in oral cavity - more pronounced with solids however. Moderate verbal cues needed to modulate his loquaciousness. Cough x1 immediately post swallow of soda via straw and pt was wincing.grimacing when swallowing carbonated drink. He severe discomfort and did not want water in lieu of soda even when offered to him.    Recommend pt initiate a po diet of mechanical soft/dys3/thin with strict precautions and calorie count before consideration for small bore feeding tube removal.  Pt and family (son Joseph Banks and wife) educated to recommendations including pt self feeding, oral care, sustained attention to eating for safety.  Wife reports pt brushed his teeth this am - and she independently recalled importance of oral care for pulmonary hygiene. SLP will sign off as pt's swallow ability has improved concurrent with his mentation.  Thanks for allowing me to help care for this most pleasant pt and his family.    HPI HPI: 75 yo male presented to emergency department with urinary hesitancy, dysuria on 01/12/2021.  He was treated for prostatitis with oral antibiotics.  Pt with PMH + for BPH, TURP,  DM.  Was found to have lactic acidosis, atrial fibrillation and concern for sepsis.  Progressive confusion noted  with pt requiring small bore feeding tube.   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  Also has h/o COVID per RN, DM and BPH.  He underwent brain MRI today which was normal.      SLP Plan  All goals met       Recommendations  Diet recommendations: Dysphagia 3 (mechanical soft);Thin liquid Liquids provided via: Cup;Straw Medication Administration:  (as tolerated) Supervision: Full supervision/cueing for compensatory strategies Compensations: Slow rate;Small sips/bites;Other (Comment) (hand over hand assist to encourage pt to help give himself ice and/or provide oral care for neuro input-) Postural Changes and/or Swallow Maneuvers: Seated upright 90 degrees;Upright 30-60 min after meal                Oral Care Recommendations: Oral care BID Follow up Recommendations: None SLP Visit Diagnosis: Dysphagia, unspecified (R13.10) Plan: All goals met       GO              Joseph Lime, MS [place] Office [phone] Pager [phone]   Joseph Banks 01/22/2021, 7:50 PM

## 2021-01-22 NOTE — Progress Notes (Signed)
Occupational Therapy Progress Note  Patient still with confusion, stating he is at a restaurant and needing max cues/stimuli to try and keep eyes open. Patient wanting to get out of bed, able to mobilize legs off edge of bed. Improved yet still needing assist with trunk/posture due to anterior bias and neck in flexed position. Patient initially not following directions to prepare for stand using stedy, then states "I'll do the standing." Patient was able to count down from 3 and with mod A x2 and third assist present to assist with lines able to power up to standing from elevated bed height. Patient tolerate standing ~10 seconds in stedy before sitting and transferred to recliner. Patient mod x2 to stand again and max x2 for eccentric control into chair. Patient's spouse stating he is very active at baseline "he's motivated to move" and is already showing improvement from previous OT session needing total A to stand and unable to support weight. Updated D/C recommendation and acute OT to follow.     01/22/21 1300  OT Visit Information  Last OT Received On 01/22/21  Assistance Needed +3 or more  PT/OT/SLP Co-Evaluation/Treatment Yes  Reason for Co-Treatment Complexity of the patient's impairments (multi-system involvement);For patient/therapist safety;To address functional/ADL transfers  PT goals addressed during session Mobility/safety with mobility  OT goals addressed during session ADL's and self-care  History of Present Illness Patient is a 75 year old male admitted with severe sepsis, Viral encephalitis, A-fib with RVR. Recently went to [facility] ~1 month ago due to perianal pain, was diagnosed with prostatitis and epididymitis and given antibiotics. However, symptoms worsened with developing fever, suspected viral encephalitis  Precautions  Precautions Fall  Precaution Comments NG tube, fecal management system  Pain Assessment  Pain Assessment Faces  Faces Pain Scale 0  Cognition   Arousal/Alertness Lethargic  Behavior During Therapy Restless  Overall Cognitive Status Impaired/Different from baseline  Area of Impairment Orientation;Attention;Memory;Following commands;Safety/judgement;Problem solving;Awareness  Orientation Level Time;Situation;Place  Current Attention Level Focused  Memory Decreased short-term memory  Following Commands Follows one step commands inconsistently;Follows one step commands with increased time  Safety/Judgement Decreased awareness of safety;Decreased awareness of deficits  Awareness Intellectual  Problem Solving Slow processing;Decreased initiation;Difficulty sequencing;Requires verbal cues;Requires tactile cues  General Comments patient still keeping eyes closed needing up to max cues to open for functional transfer training. stating he is at a restaurant.  ADL  Overall ADL's  Needs assistance/impaired  Lower Body Dressing Total assistance  Lower Body Dressing Details (indicate cue type and reason) pulling up socks  Toilet Transfer Total assistance;Stand-pivot  Toilet Transfer Details (indicate cue type and reason) patient much improved with sit to stand from last session however needing to use stedy to complete transfer to recliner  Bed Mobility  Overal bed mobility Needs Assistance  Bed Mobility Supine to Sit  Supine to sit Max assist  General bed mobility comments patient initiating with lower extremities off edge of bed, however still with anterior lean at trunk needing max A for safety and max cues to correct posture  Balance  Overall balance assessment Needs assistance  Sitting-balance support Feet supported;Bilateral upper extremity supported  Sitting balance-Leahy Scale Poor  Sitting balance - Comments improved midline sitting balance, continues with head/trunk anterior lean. Mutimodal / tactile cues to hold the head upright.  Standing balance-Leahy Scale Poor  Standing balance comment standing in STEDY  Transfers  Overall  transfer level Needs assistance  Equipment used None  Transfer via [place] to/from  Stand;Stand Pivot Transfers  Sit to Stand Mod assist;+2 safety/equipment;+2 physical assistance  Stand pivot transfers Total assist (used stedy)  General transfer comment patient able to provide increased assist ~40% effort to power up to standing from edge of bed with height elevated and maintained static stand for ~10 seconds before sitting onto stedy. mod x2 to stand again from stedy and max A x2 for eccentric control into chair.  OT - End of Session  Equipment Utilized During Treatment Gait belt  Activity Tolerance Patient tolerated treatment well  Patient left in chair;with call bell/phone within reach;with chair alarm set;with family/visitor present  Nurse Communication Mobility status;Need for lift equipment  OT Assessment/Plan  OT Plan Discharge plan needs to be updated  OT Visit Diagnosis Unsteadiness on feet (R26.81);Other abnormalities of gait and mobility (R26.89);Muscle weakness (generalized) (M62.81);Other symptoms and signs involving cognitive function  OT Frequency (ACUTE ONLY) Min 2X/week  Follow Up Recommendations CIR  OT Equipment Other (comment) (TBD)  AM-PAC OT "6 Clicks" Daily Activity Outcome Measure (Version 2)  Help from another person eating meals? 1 (NPO)  Help from another person taking care of personal grooming? 2  Help from another person toileting, which includes using toliet, bedpan, or urinal? 1  Help from another person bathing (including washing, rinsing, drying)? 2  Help from another person to put on and taking off regular upper body clothing? 2  Help from another person to put on and taking off regular lower body clothing? 1  6 Click Score 9  Progressive Mobility  What is the highest level of mobility based on the progressive mobility assessment? Level 3 (Stands with assist) - Balance while standing  and cannot march in place  Mobility Out  of bed to chair with meals  OT Goal Progression  Progress towards OT goals Progressing toward goals  Acute Rehab OT Goals  Patient Stated Goal to chair  OT Goal Formulation With patient/family  Time For Goal Achievement 02/03/21  Potential to Achieve Goals Good  ADL Goals  Pt Will Perform Upper Body Bathing with supervision;sitting  Pt Will Transfer to Toilet with mod assist;bedside commode;stand pivot transfer  Additional ADL Goal #1 Patient will follow 1 step directions with less than 25% multimodal cues in order to participate safely in self care tasks.  OT Time Calculation  OT Start Time (ACUTE ONLY) 0810  OT Stop Time (ACUTE ONLY) 8828  OT Time Calculation (min) 28 min  OT General Charges  $OT Visit 1 Visit  OT Treatments  $Self Care/Home Management  8-22 mins   Delbert Phenix OT OT pager: [phone]

## 2021-01-22 NOTE — Progress Notes (Signed)
Inpatient Rehabilitation Admissions Coordinator   I spoke with patient's wife by phone to make her aware that I plan to come onsite Friday about 4 to 4:30 pm for onsite assessment of his rehab needs and venue options.   Danne Baxter, RN, MSN Rehab Admissions Coordinator [phone] 01/22/2021 3:39 PM

## 2021-01-22 NOTE — Progress Notes (Addendum)
[facility] for Infectious Disease    Date of Admission:  01/12/2021   Total days of antibiotics            ID: Joseph Banks is a 75 y.o. male with  ams, aseptic/viral meningits Active Problems:   Acute lower UTI   Diabetes mellitus type 2 in nonobese (HCC)   Elevated LFTs   AKI (acute kidney injury) ([place])   Atrial fibrillation with RVR (HCC)   BPH (benign prostatic hyperplasia)   Dysphagia   Viral encephalitis   Back pain    Subjective: Started on food by mouth since more alert. Patient's HSV PCR is negative from CSF testing. Answer questions more appropriately, still some difficulty remembering events leading up to hospitalization  Medications:   chlorhexidine  15 mL Mouth Rinse BID   Chlorhexidine Gluconate Cloth  6 each Topical Q0600   finasteride  5 mg Oral Daily   free water  100 mL Per Tube Q4H   gabapentin  600 mg Per Tube BID   insulin aspart  0-6 Units Subcutaneous Q4H   lidocaine  1 patch Transdermal Q24H   mouth rinse  15 mL Mouth Rinse BID   sodium chloride flush  10-40 mL Intracatheter Q12H    Objective: Vital signs in last 24 hours: Temp:  [97.6 F (36.4 C)-99.3 F (37.4 C)] 99.3 F (37.4 C) (07/14 0800) Pulse Rate:  [65-102] 96 (07/14 0600) Resp:  [12-23] 17 (07/14 0600) BP: (132-161)/(55-84) 160/76 (07/14 0600) SpO2:  [79 %-97 %] 93 % (07/14 0600) Physical Exam  Constitutional: He is oriented to person, place.. He appears well-developed and well-nourished. No distress.  HENT:  Mouth/Throat: Oropharynx is clear and moist. No oropharyngeal exudate.  Cardiovascular: Normal rate, regular rhythm and normal heart sounds. Exam reveals no gallop and no friction rub.  No murmur heard.  Pulmonary/Chest: Effort normal and breath sounds normal. No respiratory distress. He has no wheezes.  Abdominal: Soft. Bowel sounds are normal. He exhibits no distension. There is no tenderness.  Lymphadenopathy:  He has no cervical adenopathy.  Neurological: He  is alert and oriented to person, place. Skin: Skin is warm and dry. No rash noted. No erythema.  Psychiatric: He has a normal mood and affect. His behavior is normal.    Lab Results Recent Labs    01/21/21 0623 01/22/21 0202  WBC 7.5 7.0  HGB 9.8* 10.1*  HCT 29.5* 29.8*  NA 136 136  K 4.0 4.2  CL 104 104  CO2 25 26  BUN 18 17  CREATININE 0.58* 0.59*   Liver Panel Recent Labs    01/21/21 0623 01/22/21 0202  PROT 6.9 7.2  ALBUMIN 4.1 4.2  AST 35 30  ALT 45* 36  ALKPHOS 42 41  BILITOT 1.0 1.0     Microbiology: reviewed Studies/Results: MR BRAIN W WO CONTRAST  Result Date: 01/21/2021 CLINICAL DATA:  Ongoing encephalopathy. Lymphocytic predominance on CSF. Viral meningitis. EXAM: MRI HEAD WITHOUT AND WITH CONTRAST TECHNIQUE: Multiplanar, multiecho pulse sequences of the brain and surrounding structures were obtained without and with intravenous contrast. CONTRAST:  35mL GADAVIST GADOBUTROL 1 MMOL/ML IV SOLN COMPARISON:  CT head without contrast 01/15/2021 FINDINGS: Brain: Study is mildly degraded patient motion. Periventricular white matter changes are within normal limits for age. No acute infarct, hemorrhage, or mass lesion is present. The ventricles are of normal size. No significant extraaxial fluid collection is present. The internal auditory canals are within normal limits. The brainstem and cerebellum are within  normal limits. Postcontrast images demonstrate no pathologic enhancement. Vascular: Flow is present in the major intracranial arteries. Skull and upper cervical spine: Degenerative changes are noted at C3-4. Decreased T1 marrow signal is present throughout. No focal lesions are present. Craniocervical junction is normal. Sinuses/Orbits: Banding noted on the right. Right lens replacement present. Globes and orbits are otherwise within normal limits. Paranasal sinuses are clear. There is some fluid in the mastoid air cells bilaterally. No obstructing nasopharyngeal  lesion is present. IMPRESSION: 1. Normal MRI appearance of the brain for age. No acute or focal lesion to explain the patient's symptoms. 2. Minimal fluid in the mastoid air cells bilaterally. No obstructing nasopharyngeal lesion is present. Electronically Signed   By: San Morelle M.D.   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 Initial RMSF titers negative (though it may have been too early to detect any IgM) Since improving, recommend continued supportive care, evaluate for acute rehab, may need neurocognitive therapy post hospitalization. Recommend to do repeat RMSF as outpatient, as convalescent titer ( in 3-4 wk)  Had covid-19 3 wk prior to admit but it would be unusual to have covid-19 related meningoencephalitis so delayed from initial infection  In the meantime, would de-escalate lines, foley, rectal tubes, as appropriate  Will sign off and follow labs at a distance. Reconsult if he clinically worsens.  Spent 30 min with patient face to face time discussing next steps  [facility] for Infectious Diseases Cell: [phone] Pager: [phone]  01/22/2021, 11:22 AM

## 2021-01-22 NOTE — Progress Notes (Signed)
Physical Therapy Treatment Patient Details Name: Joseph Banks MRN: [id number] DOB: 25-Jun-1946 Today's Date: 01/22/2021    History of Present Illness Patient is a 75 year old male admitted with severe sepsis, Viral encephalitis, A-fib with RVR. Recently went to [facility] ~1 month ago due to perianal pain, was diagnosed with prostatitis and epididymitis and given antibiotics. However, symptoms worsened with developing fever, suspected viral encephalitis    PT Comments    Patient is more alert, eyes open more. Wife and son  present for encouragement.  Max assist to sit up to bed edge, trunk  and head leaning forward. Max cues to keep head more upright, intermittently holds  upright.  +3 assist to stand at [place], patient able to bear weight. Flaps down for patient  sit during  transfer to recliner, then patient stood again  with 2 mod assist for flaps up then assisted patient to sit down to recliner. Patient  stated the "123 count" when standing both times. Patient is more alert and interacting today.  Continue PT for increaswd mobility.  Follow Up Recommendations  CIR     Equipment Recommendations   (tba)    Recommendations for Other Services Rehab consult     Precautions / Restrictions      Mobility  Bed Mobility Overal bed mobility: Needs Assistance Bed Mobility: Supine to Sit     Supine to sit: Max assist     General bed mobility comments: patient slid legs over bed edge , max assist with trunk to sit upright and maintain  sitting, head forward/leaning forward, cues to reach for stedy bar, patient placed feet onto platform with cues.    Transfers Overall transfer level: Needs assistance   Transfers: Sit to/from Stand Sit to Stand: Mod assist;+2 safety/equipment;+2 physical assistance         General transfer comment: Patient stood in stedy, flaps down for transfetr to recliner, stood again with 3 count, trunk fairly erect during brief stands. Assist to descend to  recliner.  Ambulation/Gait             General Gait Details: nt   Marine scientist [place] (Stroke Patients Only)       Balance Overall balance assessment: Needs assistance Sitting-balance support: Feet supported;Bilateral upper extremity supported Sitting balance-Leahy Scale: Poor Sitting balance - Comments: improved midline sitting balance, continues with head/trunk leaning forward. Mutimodal  cues and tactile cues to hold the head upright.     Standing balance-Leahy Scale: Poor Standing balance comment: standing in STEDY                            Cognition Arousal/Alertness: Lethargic Behavior During Therapy: Restless Overall Cognitive Status: Impaired/Different from baseline Area of Impairment: Orientation;Attention;Memory;Following commands;Safety/judgement;Problem solving;Awareness                 Orientation Level: Time;Situation;Place Current Attention Level: Focused Memory: Decreased short-term memory Following Commands: Follows one step commands inconsistently;Follows one step commands with increased time Safety/Judgement: Decreased awareness of safety;Decreased awareness of deficits Awareness: Intellectual Problem Solving: Slow processing;Decreased initiation;Difficulty sequencing;Requires verbal cues;Requires tactile cues General Comments: patient needing repetitive cues with inconsistent direction following, initially open eyes, tresponsive and  following simple directions. Upon sitting, appeared to "doze"(? meds per family present). Patient then aroused. Stated" I'll stand. count to 3". Patient did so and stood  Exercises      General Comments        Pertinent Vitals/Pain Faces Pain Scale: No hurt    Home Living                      Prior Function            PT Goals (current goals can now be found in the care plan section) Progress towards PT goals: Progressing toward  goals    Frequency    Min 2X/week      PT Plan Current plan remains appropriate    Co-evaluation PT/OT/SLP Co-Evaluation/Treatment: Yes Reason for Co-Treatment: Necessary to address cognition/behavior during functional activity;To address functional/ADL transfers;Complexity of the patient's impairments (multi-system involvement);For patient/therapist safety PT goals addressed during session: Mobility/safety with mobility;Balance;Strengthening/ROM OT goals addressed during session: ADL's and self-care;Strengthening/ROM      AM-PAC PT "6 Clicks" Mobility   Outcome Measure  Help needed turning from your back to your side while in a flat bed without using bedrails?: A Lot Help needed moving from lying on your back to sitting on the side of a flat bed without using bedrails?: A Lot Help needed moving to and from a bed to a chair (including a wheelchair)?: Total Help needed standing up from a chair using your arms (e.g., wheelchair or bedside chair)?: Total Help needed to walk in hospital room?: Total Help needed climbing 3-5 steps with a railing? : Total 6 Click Score: 8    End of Session Equipment Utilized During Treatment: Gait belt Activity Tolerance: Patient tolerated treatment well Patient left: in chair;with call bell/phone within reach;with family/visitor present;with chair alarm set Nurse Communication: Mobility status;Need for lift equipment PT Visit Diagnosis: Unsteadiness on feet (R26.81);Difficulty in walking, not elsewhere classified (R26.2);Other abnormalities of gait and mobility (R26.89)     Time: 6599-3570 PT Time Calculation (min) (ACUTE ONLY): 31 min  Charges:  $Therapeutic Activity: 8-22 mins                     Tresa Endo PT Acute Rehabilitation Services Pager [phone] Office [phone]    Claretha Cooper 01/22/2021, 11:42 AM

## 2021-01-23 LAB — GLUCOSE, CAPILLARY
Glucose-Capillary: 160 mg/dL — ABNORMAL HIGH (ref 70–99)
Glucose-Capillary: 172 mg/dL — ABNORMAL HIGH (ref 70–99)
Glucose-Capillary: 223 mg/dL — ABNORMAL HIGH (ref 70–99)
Glucose-Capillary: 217 mg/dL — ABNORMAL HIGH (ref 70–99)
Glucose-Capillary: 181 mg/dL — ABNORMAL HIGH (ref 70–99)
Glucose-Capillary: 146 mg/dL — ABNORMAL HIGH (ref 70–99)

## 2021-01-23 MED ORDER — INSULIN ASPART 100 UNIT/ML IJ SOLN
4.0000 [IU] | Freq: Once | INTRAMUSCULAR | Status: AC
  Administered 2021-01-23: 4 [IU] via SUBCUTANEOUS

## 2021-01-23 MED ORDER — INSULIN ASPART 100 UNIT/ML IJ SOLN: 0.0000 [IU] | Freq: Every day | INTRAMUSCULAR | Status: DC

## 2021-01-23 MED ORDER — TRAZODONE HCL 50 MG PO TABS: 50.0000 mg | ORAL_TABLET | Freq: Every evening | ORAL | Status: DC | PRN

## 2021-01-23 MED ORDER — ACETAMINOPHEN 160 MG/5ML PO SOLN
650.0000 mg | ORAL | Status: DC | PRN
  Administered 2021-01-23 (×2): 650 mg via ORAL
  Filled 2021-01-23 (×2): qty 20.3

## 2021-01-23 MED ORDER — LORAZEPAM 2 MG/ML IJ SOLN
0.5000 mg | INTRAMUSCULAR | Status: DC | PRN
  Administered 2021-01-24: 0.5 mg via INTRAVENOUS
  Filled 2021-01-23: qty 1

## 2021-01-23 MED ORDER — HYOSCYAMINE SULFATE 0.125 MG/ML PO SOLN
0.2500 mg | Freq: Four times a day (QID) | ORAL | Status: DC | PRN
  Filled 2021-01-23: qty 2

## 2021-01-23 MED ORDER — INSULIN ASPART 100 UNIT/ML IJ SOLN
0.0000 [IU] | Freq: Three times a day (TID) | INTRAMUSCULAR | Status: DC
Start: 2021-01-23 — End: 2021-01-23

## 2021-01-23 MED ORDER — OXYCODONE HCL 5 MG PO TABS
5.0000 mg | ORAL_TABLET | ORAL | Status: DC | PRN
  Administered 2021-01-28: 5 mg via ORAL
  Filled 2021-01-23: qty 1

## 2021-01-23 MED ORDER — MORPHINE SULFATE (PF) 2 MG/ML IV SOLN: 2.0000 mg | INTRAVENOUS | Status: DC | PRN

## 2021-01-23 MED ORDER — TRAZODONE HCL 50 MG PO TABS
50.0000 mg | ORAL_TABLET | Freq: Every day | ORAL | Status: DC
  Administered 2021-01-23 – 2021-01-26 (×4): 50 mg via ORAL
  Filled 2021-01-23 (×4): qty 1

## 2021-01-23 MED ORDER — INSULIN ASPART 100 UNIT/ML IJ SOLN
0.0000 [IU] | Freq: Three times a day (TID) | INTRAMUSCULAR | Status: DC
  Administered 2021-01-23: 2 [IU] via SUBCUTANEOUS
  Administered 2021-01-24: 1 [IU] via SUBCUTANEOUS
  Administered 2021-01-24: 3 [IU] via SUBCUTANEOUS
  Administered 2021-01-24: 1 [IU] via SUBCUTANEOUS
  Administered 2021-01-25: 2 [IU] via SUBCUTANEOUS
  Administered 2021-01-25: 1 [IU] via SUBCUTANEOUS
  Administered 2021-01-25: 2 [IU] via SUBCUTANEOUS
  Administered 2021-01-26 (×2): 1 [IU] via SUBCUTANEOUS
  Administered 2021-01-26: 2 [IU] via SUBCUTANEOUS

## 2021-01-23 MED ORDER — LIP MEDEX EX OINT
TOPICAL_OINTMENT | CUTANEOUS | Status: AC
  Filled 2021-01-23: qty 7

## 2021-01-23 MED ORDER — GABAPENTIN 250 MG/5ML PO SOLN
300.0000 mg | Freq: Two times a day (BID) | ORAL | Status: DC
  Administered 2021-01-23 – 2021-01-26 (×6): 300 mg via ORAL
  Filled 2021-01-23 (×6): qty 6

## 2021-01-23 MED ORDER — TRAMADOL HCL 50 MG PO TABS
50.0000 mg | ORAL_TABLET | Freq: Four times a day (QID) | ORAL | Status: DC | PRN
  Administered 2021-01-24 – 2021-01-28 (×2): 50 mg via ORAL
  Filled 2021-01-23 (×2): qty 1

## 2021-01-23 NOTE — Progress Notes (Signed)
Occupational Therapy Treatment Patient Details Name: Joseph Banks MRN: [id number] DOB: [birth year] Today's Date: 01/23/2021    History of present illness Patient is a 75 year old male admitted with severe sepsis, Viral encephalitis, A-fib with RVR. Recently went to [facility] ~1 month ago due to perianal pain, was diagnosed with prostatitis and epididymitis and given antibiotics. However, symptoms worsened with developing fever, suspected viral encephalitis   OT comments  Treatment focused on sit to stand in order to advance towards higher level ADLs. Performed 4 sit to stands with RW and stedy. Patient exhibited right lateral lean and anterior bias with RW. Improved balance with stedy.Required +3 people to keep patiently safely in standing. Patient's knees buckle with fatigue. Improved tolerance for activity today. Continues to exhibit impaired cognition requiring constant verbal cues. Continue to recommend short term rehab.   Follow Up Recommendations  CIR    Equipment Recommendations  Other (comment) (TBD)    Recommendations for Other Services      Precautions / Restrictions Precautions Precautions: Fall Precaution Comments: anterior lean with standing Restrictions Weight Bearing Restrictions: No       Mobility Bed Mobility               General bed mobility comments: Up in chair    Transfers Overall transfer level: Needs assistance Equipment used: Rolling walker (2 wheeled) Transfers: Sit to/from Stand Sit to Stand: +2 physical assistance;+2 safety/equipment;Mod assist         General transfer comment: Performed 2 sit to stands with RW requring mod x 2 to stand but max x 2 to maintain standing position secondary to right lateral lean and anterior bias. Patient's wife in front of him to keep him from leaning too far forward. Patient's knees buckling. Performed two stands with stedy. Mod x 2 for sit to stand with stedy and patient exhibited improved ability to maitnain  balance with stedy bar. Legs fatigue quickly and knees began to buckle. Did exhibit some improved control of descnet on last return to sitting.    Balance Overall balance assessment: Needs assistance Sitting-balance support: No upper extremity supported Sitting balance-Leahy Scale: Fair     Standing balance support: Bilateral upper extremity supported Standing balance-Leahy Scale: Poor Standing balance comment: standing in STEDY- reliant on external assistance from therapist                           ADL either performed or assessed with clinical judgement   ADL                                               Vision Patient Visual Report: No change from baseline     Perception     Praxis      Cognition Arousal/Alertness: Awake/alert Behavior During Therapy: WFL for tasks assessed/performed Overall Cognitive Status: Impaired/Different from baseline Area of Impairment: Attention;Memory;Following commands;Safety/judgement;Problem solving;Awareness                   Current Attention Level: Sustained Memory: Decreased short-term memory;Decreased recall of precautions Following Commands: Follows one step commands inconsistently Safety/Judgement: Decreased awareness of safety;Decreased awareness of deficits Awareness: Emergent Problem Solving: Slow processing;Decreased initiation;Difficulty sequencing;Requires verbal cues;Requires tactile cues General Comments: . Exhibits inattention and poor recall. Needs consistent verbal cues to follow commands correctly  Exercises Other Exercises Other Exercises: Encouraged cervical retractions to reduce forward head, neck flexion and anterior bias in standing   Shoulder Instructions       General Comments      Pertinent Vitals/ Pain       Pain Assessment: No/denies pain  Home Living                                          Prior Functioning/Environment               Frequency  Min 2X/week        Progress Toward Goals  OT Goals(current goals can now be found in the care plan section)  Progress towards OT goals: Progressing toward goals  Acute Rehab OT Goals Patient Stated Goal: go to rehab OT Goal Formulation: With family Time For Goal Achievement: 02/03/21 Potential to Achieve Goals: Good  Plan Discharge plan remains appropriate    Co-evaluation          OT goals addressed during session:  (functional mobilty)      AM-PAC OT "6 Clicks" Daily Activity     Outcome Measure   Help from another person eating meals?: A Little Help from another person taking care of personal grooming?: A Little Help from another person toileting, which includes using toliet, bedpan, or urinal?: Total Help from another person bathing (including washing, rinsing, drying)?: A Lot Help from another person to put on and taking off regular upper body clothing?: A Lot Help from another person to put on and taking off regular lower body clothing?: Total 6 Click Score: 12    End of Session Equipment Utilized During Treatment: Rolling walker;Other (comment) (stedy)  OT Visit Diagnosis: Unsteadiness on feet (R26.81);Other abnormalities of gait and mobility (R26.89);Muscle weakness (generalized) (M62.81);Other symptoms and signs involving cognitive function   Activity Tolerance Patient tolerated treatment well   Patient Left in chair;with call bell/phone within reach;with chair alarm set;with family/visitor present   Nurse Communication Mobility status (rectal tube fell out)        Time: 6237-6283 OT Time Calculation (min): 28 min  Charges: OT General Charges $OT Visit: 1 Visit OT Treatments $Therapeutic Activity: 23-37 mins  Khyrin Trevathan, OTR/L [place]  Office [phone] Pager: [place] 01/23/2021, 1:29 PM

## 2021-01-23 NOTE — Progress Notes (Signed)
I provided support to Joseph Banks and his wife, Joseph Banks, as well as prayer, at patient's request.  They are feeling very grateful today that he was able to eat.  [place], [place] Pager, [phone] 3:39 PM

## 2021-01-23 NOTE — Progress Notes (Signed)
Pt had minimal pink-tinged urine in foley bag and c/o 3/10 discomfort. Pt reports that's tubing likely got tugged on during PT. MD made aware. Will continue to monitor.

## 2021-01-23 NOTE — Progress Notes (Signed)
Inpatient Rehabilitation Admissions Coordinator   I met at bedside with patient , wife and daughter for rehab assessment. We discussed goals and explications of a possible [facility] admit. They prefer [facility] over SNF. I have begun Auth today and will follow up next week when I have heard from [place].  Danne Baxter, RN, MSN Rehab Admissions Coordinator [phone] 01/23/2021 4:32 PM

## 2021-01-23 NOTE — Progress Notes (Signed)
Joseph Banks  DPO:[id number] DOB: April 08, 1946 DOA: 01/12/2021 PCP: Glenda Chroman, MD    Brief Narrative:  75yo with a history of DM who had recently been diagnosed with prostatitis treated with ceftriaxone and bactrim but continued to experience worsening perianal pain with fever as high as 104. He was therefore changed to ciprofloxacin but symptoms persisted. CT at time of repeat presentation suggested a persistent bladder mass and enlarging prostate.  Significant Events:  Since admission the patient has been treated with antibiotic under the direction of the ID service.  His stay has been complicated by the development of new atrial fibrillation with RVR requiring medication titration.  He has remained persistently encephalopathic with intermittent fevers. 6/16 seen by Urology - UTI sx - rocephin IM + bactrim po 6/30 [facility] ED worsening pain/fever - CT abdom - cipro po 7/4 admit via [facility] ED > [facility] - persisting fevers/pain 7/4 transfer to ICU due to sepsis - encephalopathy 7/7 PICC line placement 7/8 LP under conscious sedation 7/13 MRI brain - no acute findings   Consultants:  Infectious Disease Urology PCCM IR  Code Status: FULL CODE  Antimicrobials:  Vanc 7/4 >> 7/7 Meropenem 7/4 >> 7/7 Rocehin 7/7 >> 7/9 Ampicillin 7/7 >> 7/9 Doxy 7/7 > 7/13 Acyclovir 7/9 > 7/13  DVT prophylaxis: SCDs  Subjective: Mental status has dramatically improved over the last 24 hours.  Is alert and conversant and able to answer most questions appropriately.  Not yet fully back to baseline but greatly improved.  Denies chest pain or shortness of breath.  Appears comfortable.  Tolerating intake without difficulty and anxious to have the NG tube removed.  Assessment & Plan:  Acute prostatitis with severe sepsis POA Sepsis resolved - no prostatic abscess noted on MRI - Foley placed by Urology on admission - has now defervesced - has completed the course of antibiotic therapy  Viral encephalitis -  aseptic meningitis - toxic metabolic encephalopathy Status post LP 7/8 - HSV and Enterovirus PCR negative therefore acyclovir stopped - symptoms to include AMS, photophobia, nuchal rigidity, agitation - very slowly improved initially, but now appears to be improving more rapidly - MRI brain 7/13 without acute findings - B12, folate, and ammonia normal   Back pain Felt to be due to prolonged bedrest   Dysphagia With improved mental status is now safe for regular diet  BPH Continue Foley per Urology - will need outpatient follow-up for cath removal  Newly diagnosed atrial fibrillation with RVR Precipitated by sepsis -TSH normal - magnesium normal - NSR at time of exam today -given onset during severe acute illness will not commit patient to lifelong anticoagulation for now -this will need to be monitored in outpatient follow-up  Acute kidney injury Felt to be related to bladder outlet obstruction which is now resolved with Foley - renal function has normalized  Thrombocytopenia Resolved - due to sepsis  Transaminitis Likely shock liver - LFTs have normalized  DM2 CBG well controlled   Family Communication: Spoke with wife at bedside Status is: Inpatient  Remains inpatient appropriate because:Inpatient level of care appropriate due to severity of illness  Dispo: The patient is from: Home              Anticipated d/c is to: Home              Patient currently is medically stable to d/c.   Difficult to place patient No   Objective: Blood pressure (!) 145/71, pulse 85, temperature 98.4  F (36.9 C), temperature source Axillary, resp. rate 18, height 6\' 3"  (1.905 m), weight 97.4 kg, SpO2 97 %.  Intake/Output Summary (Last 24 hours) at 01/23/2021 0726 Last data filed at 01/23/2021 0630 Gross per 24 hour  Intake 3656.62 ml  Output 3425 ml  Net 231.62 ml    Filed Weights   01/19/21 0652 01/20/21 0621 01/21/21 0500  Weight: 104.4 kg 101.7 kg 97.4 kg    Examination: General:  No acute respiratory distress Lungs: CTA B without wheezing Cardiovascular: RRR without murmur Abdomen: NT/ND, soft, BS positive Extremities: Trace BLE  CBC: Recent Labs  Lab 01/19/21 0545 01/20/21 0539 01/21/21 0623 01/22/21 0202  WBC 8.3 8.2 7.5 7.0  NEUTROABS 2.5 3.3 3.3  --   HGB 9.7* 9.6* 9.8* 10.1*  HCT 28.7* 29.0* 29.5* 29.8*  MCV 92.6 92.9 93.4 93.4  PLT 235 274 339 409    Basic Metabolic Panel: Recent Labs  Lab 01/20/21 0539 01/21/21 0623 01/22/21 0202  NA 137 136 136  K 3.7 4.0 4.2  CL 109 104 104  CO2 22 25 26   GLUCOSE 147* 158* 148*  BUN 15 18 17   CREATININE 0.51* 0.58* 0.59*  CALCIUM 7.6* 9.0 9.3  MG 2.0 2.2 2.3  PHOS 2.7 3.2 3.3    GFR: Estimated Creatinine Clearance: 96.8 mL/min (A) (by C-G formula based on SCr of 0.59 mg/dL (L)).  Liver Function Tests: Recent Labs  Lab 01/19/21 0545 01/20/21 0539 01/21/21 0623 01/22/21 0202  AST 76* 48* 35 30  ALT 70* 54* 45* 36  ALKPHOS 44 40 42 41  BILITOT 0.9 0.8 1.0 1.0  PROT 6.3* 5.7* 6.9 7.2  ALBUMIN 3.6 3.1* 4.1 4.2     CBG: Recent Labs  Lab 01/22/21 1247 01/22/21 1537 01/22/21 1944 01/22/21 2342 01/23/21 0305  GLUCAP 164* 176* 207* 158* 160*     Recent Results (from the past 240 hour(s))  Culture, blood (single)     Status: None   Collection Time: 01/14/21  8:50 AM   Specimen: BLOOD LEFT HAND  Result Value Ref Range Status   Specimen Description   Final    BLOOD LEFT HAND Performed at [facility], [place] [address], [place], [place] [address]    Special Requests   Final    BOTTLES DRAWN AEROBIC ONLY Blood Culture adequate volume Performed at [facility] [address], [place], [place] [address]    Culture   Final    NO GROWTH 5 DAYS Performed at [facility], [place] [address], [place], [place] [address]    Report Status 01/19/2021 FINAL  Final  CSF culture w Gram Stain     Status: None   Collection Time: 01/16/21  4:30 PM    Specimen: Cerebrospinal Fluid  Result Value Ref Range Status   Specimen Description   Final    LUMBAR Performed at [facility] [address] [place], [place] [address]    Special Requests   Final    NONE Performed at [facility], [place] [address], [place], [place] [address]    Gram Stain   Final    WBC PRESENT, PREDOMINANTLY MONONUCLEAR NO ORGANISMS SEEN CYTOSPIN SMEAR Gram Stain Report Called to,Read Back By and Verified With: S.WEAVER, RN AT 2153 ON 07.08.22 BY N.THOMPSON Performed at [facility] [address] [place], [place] [address]    Culture   Final    NO GROWTH 3 DAYS Performed at [facility], [place] [address], [place], [place] [address]    Report Status 01/20/2021 FINAL  Final  Culture, fungus without smear     Status: None (Preliminary result)   Collection Time: 01/16/21  4:30 PM   Specimen: Cerebrospinal Fluid  Result Value Ref Range Status   Specimen Description   Final    LUMBAR Performed at [facility] [address] [place], [place] [address]    Special Requests   Final    NONE Performed at [facility], [place] [address] [place], [place] [address]    Culture   Final    NO FUNGUS ISOLATED AFTER 6 DAYS Performed at [facility], [place] [address] [place], [place] [address]    Report Status PENDING  Incomplete  Anaerobic culture w Gram Stain     Status: None   Collection Time: 01/16/21  4:30 PM   Specimen: Cerebrospinal Fluid  Result Value Ref Range Status   Specimen Description   Final    LUMBAR Performed at [place] [address], [place], [place] [address]    Special Requests   Final    NONE Performed at [facility], [place] [address], [place], [place] [address]    Gram Stain   Final    WBC PRESENT, PREDOMINANTLY MONONUCLEAR NO ORGANISMS SEEN CYTOSPIN SMEAR    Culture   Final    NO ANAEROBES  ISOLATED Performed at [facility], [place] [address] [place] [address] [place], [place] [address]    Report Status 01/22/2021 FINAL  Final  HSV 1/2 Ab IgG/IgM CSF     Status: None   Collection Time: 01/16/21  4:30 PM   Specimen: Cerebrospinal Fluid  Result Value Ref Range Status   HSV 1/2 Ab, IgM, CSF 0.44 <=0.89 IV Final    Comment: (NOTE) INTERPRETIVE INFORMATION: Herpes Simplex Virus                          Type 1 and/or 2 Antibodies,                          IgM by ELISA, CSF  0.89 IV or Less .......... Negative: No significant                             level of detectable HSV IgM                             antibody.  0.90 - 1.09 IV ........... Equivocal: Questionable                             presence of IgM antibodies.                             Repeat testing in 10-14 days                             may be helpful.  1.10 IV or Greater ....... Positive: IgM antibody to HSV                             detected, which may  indicate a                             current or recent infection.                             However, low levels of IgM                             antibodies may occasionally                             persist for more than 12                             months post-infection. The detection of antibodies to herpes simplex virus in CSF may indicate central nervou s system infection. However, consideration must be given to possible contamination by blood or transfer of serum antibodies across the blood-brain barrier. Fourfold or greater rise in CSF antibodies to herpes on specimens at least 4 weeks apart are found in 74-94 % of patients with herpes encephalitis. Specificity of the test based on a single CSF testing is not established. Presently PCR is the primary means of establishing a diagnosis of herpes encephalitis. This test was developed and its performance characteristics determined by [employer]. It has not been cleared or approved by the [place]  Food and Drug Administration. This test was performed in a CLIA certified laboratory and is intended for clinical purposes.    HSV 1/2 Ab Screen IgG, CSF 0.39 <=0.89 IV Final    Comment: (NOTE) INTERPRETIVE INFORMATION: Herpes Simplex Virus Type 1 and/or 2                    Antibodies, IgG CSF  0.89 IV or Less .......... Negative: No significant                             level of detectable HSV IgG                             antibody.  0.90 - 1.09 IV ........... Equivocal: Questionable                             presence of IgG antibodies.                             Repeat testing in 10-14 days                             may be helpful.  1.10 IV or Greater ....... Positive: IgG antibody to HSV                             detected, which may indicate                             a current or past HSV  infection. The detection of antibodies to herpes simplex virus in CSF may indicate central nervous system infection. However, consideration must be given to possible contamination by blood or transfer of serum antibodies across the blood-brain barrier. Fourfold or greater rise in CSF antibodies to herpes on specimens at l east 4 weeks apart are found in 74-94 % of patients with herpes encephalitis. Specificity of the test based on a single CSF testing is not established. Presently PCR is the primary means of establishing a diagnosis of herpes encephalitis. This test was developed and its performance characteristics determined by [employer]. It has not been cleared or approved by the [place] Food and Drug Administration. This test was performed in a CLIA certified laboratory and is intended for clinical purposes. Performed At: [facility] [address] [place], [place] [id number] Hilton Sinclair I MD RQ:[id number]       Scheduled Meds:  chlorhexidine  15 mL Mouth Rinse BID   Chlorhexidine Gluconate Cloth  6 each Topical Q0600    finasteride  5 mg Oral Daily   free water  100 mL Per Tube Q4H   gabapentin  300 mg Per Tube BID   insulin aspart  0-6 Units Subcutaneous Q4H   lidocaine  1 patch Transdermal Q24H   mouth rinse  15 mL Mouth Rinse BID   sodium chloride flush  10-40 mL Intracatheter Q12H   Continuous Infusions:  feeding supplement (GLUCERNA 1.5 CAL) 1,000 mL (01/22/21 2200)     LOS: 11 days   Cherene Altes, MD [facility] Office  [phone] Pager - Text Page per Shea Evans  If 7PM-7AM, please contact night-coverage per Amion 01/23/2021, 7:26 AM

## 2021-01-24 LAB — GLUCOSE, CAPILLARY
Glucose-Capillary: 132 mg/dL — ABNORMAL HIGH (ref 70–99)
Glucose-Capillary: 139 mg/dL — ABNORMAL HIGH (ref 70–99)
Glucose-Capillary: 218 mg/dL — ABNORMAL HIGH (ref 70–99)
Glucose-Capillary: 137 mg/dL — ABNORMAL HIGH (ref 70–99)

## 2021-01-24 MED ORDER — ALTEPLASE 2 MG IJ SOLR
2.0000 mg | Freq: Once | INTRAMUSCULAR | Status: DC
  Filled 2021-01-24: qty 2

## 2021-01-24 MED ORDER — ACETAMINOPHEN 325 MG PO TABS
650.0000 mg | ORAL_TABLET | Freq: Four times a day (QID) | ORAL | Status: DC | PRN
  Administered 2021-01-24 – 2021-01-30 (×4): 650 mg via ORAL
  Filled 2021-01-24 (×5): qty 2

## 2021-01-24 NOTE — Progress Notes (Signed)
KEO SCHIRMER  [id number]:[id number] DOB: 1946-06-14 DOA: 01/12/2021 PCP: Glenda Chroman, MD    Brief Narrative:  75yo with a history of DM who had recently been diagnosed with prostatitis treated with ceftriaxone and bactrim but continued to experience worsening perianal pain with fever as high as 104. He was therefore changed to ciprofloxacin but symptoms persisted. CT at time of repeat presentation suggested a persistent bladder mass and enlarging prostate.  Significant Events:  Since admission the patient has been treated with antibiotic under the direction of the ID service.  His stay has been complicated by the development of new atrial fibrillation with RVR requiring medication titration.  He remained encephalopathic for an extended period of time before slowly regaining his cognition. 6/16 seen by Urology - UTI sx - rocephin IM + bactrim po 6/30 [facility] ED worsening pain/fever - CT abdom - cipro po 7/4 admit via [facility] ED > [facility] - persisting fevers/pain 7/4 transfer to ICU due to sepsis - encephalopathy 7/7 PICC line placement 7/8 LP under conscious sedation 7/13 MRI brain - no acute findings   Outpatient Follow-Up Issues: -will need outpatient Urology follow-up for foley cath removal -monitor for recurrent/paroxysmal Afib   Consultants:  Infectious Disease Urology PCCM IR  Code Status: FULL CODE  Antimicrobials:  Vanc 7/4 >> 7/7 Meropenem 7/4 >> 7/7 Rocehin 7/7 >> 7/9 Ampicillin 7/7 >> 7/9 Doxy 7/7 > 7/13 Acyclovir 7/9 > 7/13  DVT prophylaxis: SCDs  Subjective: Mental status continues to improve rapidly.  Remains amber so slightly confused today but for the most part is interactive and intact.  Denies any new complaints.  Assessment & Plan:  Acute prostatitis with severe sepsis POA Sepsis resolved - no prostatic abscess noted on MRI - Foley placed by Urology on admission - has now defervesced - has completed a course of antibiotic therapy  Viral encephalitis - aseptic  meningitis - toxic metabolic encephalopathy Status post LP 7/8 - HSV and Enterovirus PCR negative therefore acyclovir stopped - meningitis and encephalitis panel negative per ID report - symptoms included AMS, photophobia, nuchal rigidity, agitation - very slowly improved initially, but now appears to be improving more rapidly - MRI brain 7/13 without acute findings - B12, folate, and ammonia normal   Back pain Felt to be due to prolonged bedrest - improving w/ increased motion   Dysphagia With improved mental status is now safe for regular diet - resolved   BPH Continue Foley per Urology - will need outpatient follow-up for cath removal  Newly diagnosed transient atrial fibrillation with RVR Precipitated by sepsis -TSH normal - magnesium normal - NSR throughout second portion of hospital stay -given onset during severe acute illness will not commit patient to lifelong anticoagulation for now - this will need to be monitored in outpatient follow-up  Acute kidney injury Felt to be related to bladder outlet obstruction which is now resolved with Foley - renal function has normalized  Thrombocytopenia Resolved - due to sepsis  Transaminitis Likely shock liver - LFTs have normalized  DM2 CBG well controlled   Family Communication: Spoke with son at bedside Status is: Inpatient  Remains inpatient appropriate because:Inpatient level of care appropriate due to severity of illness  Dispo: The patient is from: Home              Anticipated d/c is to: Home              Patient currently is medically stable to d/c.   Difficult to  place patient No   Objective: Blood pressure 136/85, pulse 90, temperature 98 F (36.7 C), temperature source Oral, resp. rate 18, height 6\' 3"  (1.905 m), weight 97.4 kg, SpO2 95 %.  Intake/Output Summary (Last 24 hours) at 01/24/2021 0726 Last data filed at 01/24/2021 0600 Gross per 24 hour  Intake 900 ml  Output 2775 ml  Net -1875 ml    Filed Weights    01/19/21 0652 01/20/21 0621 01/21/21 0500  Weight: 104.4 kg 101.7 kg 97.4 kg    Examination: General: No acute respiratory distress Lungs: CTA B  Cardiovascular: RRR without murmur or rub Abdomen: NT/ND, soft, BS positive Extremities: No edema bilateral lower extremities  CBC: Recent Labs  Lab 01/19/21 0545 01/20/21 0539 01/21/21 0623 01/22/21 0202  WBC 8.3 8.2 7.5 7.0  NEUTROABS 2.5 3.3 3.3  --   HGB 9.7* 9.6* 9.8* 10.1*  HCT 28.7* 29.0* 29.5* 29.8*  MCV 92.6 92.9 93.4 93.4  PLT 235 274 339 937    Basic Metabolic Panel: Recent Labs  Lab 01/20/21 0539 01/21/21 0623 01/22/21 0202  NA 137 136 136  K 3.7 4.0 4.2  CL 109 104 104  CO2 22 25 26   GLUCOSE 147* 158* 148*  BUN 15 18 17   CREATININE 0.51* 0.58* 0.59*  CALCIUM 7.6* 9.0 9.3  MG 2.0 2.2 2.3  PHOS 2.7 3.2 3.3    GFR: Estimated Creatinine Clearance: 96.8 mL/min (A) (by C-G formula based on SCr of 0.59 mg/dL (L)).  Liver Function Tests: Recent Labs  Lab 01/19/21 0545 01/20/21 0539 01/21/21 0623 01/22/21 0202  AST 76* 48* 35 30  ALT 70* 54* 45* 36  ALKPHOS 44 40 42 41  BILITOT 0.9 0.8 1.0 1.0  PROT 6.3* 5.7* 6.9 7.2  ALBUMIN 3.6 3.1* 4.1 4.2     CBG: Recent Labs  Lab 01/23/21 0731 01/23/21 1105 01/23/21 1347 01/23/21 1629 01/23/21 2126  GLUCAP 172* 223* 217* 181* 146*     Recent Results (from the past 240 hour(s))  Culture, blood (single)     Status: None   Collection Time: 01/14/21  8:50 AM   Specimen: BLOOD LEFT HAND  Result Value Ref Range Status   Specimen Description   Final    BLOOD LEFT HAND Performed at [facility], [place] [address], [place], [place] [address]    Special Requests   Final    BOTTLES DRAWN AEROBIC ONLY Blood Culture adequate volume Performed at [place] [address], [place], [place] [address]    Culture   Final    NO GROWTH 5 DAYS Performed at [facility], [place] [address], [place], [place] [address]     Report Status 01/19/2021 FINAL  Final  CSF culture w Gram Stain     Status: None   Collection Time: 01/16/21  4:30 PM   Specimen: Cerebrospinal Fluid  Result Value Ref Range Status   Specimen Description   Final    LUMBAR Performed at [place] [address] [place], [place] [address]    Special Requests   Final    NONE Performed at [facility], [place] [address], [place], [place] [address]    Gram Stain   Final    WBC PRESENT, PREDOMINANTLY MONONUCLEAR NO ORGANISMS SEEN CYTOSPIN SMEAR Gram Stain Report Called to,Read Back By and Verified With: S.WEAVER, RN AT 2153 ON 07.08.22 BY N.THOMPSON Performed at [place] [address] [place], [place] [address]  Culture   Final    NO GROWTH 3 DAYS Performed at [facility], [place] [address] [place], [place] [address]    Report Status 01/20/2021 FINAL  Final  Culture, fungus without smear     Status: None (Preliminary result)   Collection Time: 01/16/21  4:30 PM   Specimen: Cerebrospinal Fluid  Result Value Ref Range Status   Specimen Description   Final    LUMBAR Performed at [place] [address] [place], [place] [address]    Special Requests   Final    NONE Performed at [facility], [place] [address], [place], [place] [address]    Culture   Final    NO FUNGUS ISOLATED AFTER 7 DAYS Performed at [facility], [place] [address], [place], [place] [address]    Report Status PENDING  Incomplete  Anaerobic culture w Gram Stain     Status: None   Collection Time: 01/16/21  4:30 PM   Specimen: Cerebrospinal Fluid  Result Value Ref Range Status   Specimen Description   Final    LUMBAR Performed at [place] [address] [place], [place] [address]    Special Requests   Final    NONE Performed at [facility], [place] [address], [place], [place] [address]    Gram Stain    Final    WBC PRESENT, PREDOMINANTLY MONONUCLEAR NO ORGANISMS SEEN CYTOSPIN SMEAR    Culture   Final    NO ANAEROBES ISOLATED Performed at [facility], [place] [address], [place], [place] [address]    Report Status 01/22/2021 FINAL  Final  HSV 1/2 Ab IgG/IgM CSF     Status: None   Collection Time: 01/16/21  4:30 PM   Specimen: Cerebrospinal Fluid  Result Value Ref Range Status   HSV 1/2 Ab, IgM, CSF 0.44 <=0.89 IV Final    Comment: (NOTE) INTERPRETIVE INFORMATION: Herpes Simplex Virus                          Type 1 and/or 2 Antibodies,                          IgM by ELISA, CSF  0.89 IV or Less .......... Negative: No significant                             level of detectable HSV IgM                             antibody.  0.90 - 1.09 IV ........... Equivocal: Questionable                             presence of IgM antibodies.                             Repeat testing in 10-14 days                             may be helpful.  1.10 IV or Greater ....... Positive: IgM antibody to HSV  detected, which may indicate a                             current or recent infection.                             However, low levels of IgM                             antibodies may occasionally                             persist for more than 12                             months post-infection. The detection of antibodies to herpes simplex virus in CSF may indicate central nervou s system infection. However, consideration must be given to possible contamination by blood or transfer of serum antibodies across the blood-brain barrier. Fourfold or greater rise in CSF antibodies to herpes on specimens at least 4 weeks apart are found in 74-94 % of patients with herpes encephalitis. Specificity of the test based on a single CSF testing is not established. Presently PCR is the primary means of establishing a diagnosis of herpes encephalitis. This test was  developed and its performance characteristics determined by [employer]. It has not been cleared or approved by the [place] Food and Drug Administration. This test was performed in a CLIA certified laboratory and is intended for clinical purposes.    HSV 1/2 Ab Screen IgG, CSF 0.39 <=0.89 IV Final    Comment: (NOTE) INTERPRETIVE INFORMATION: Herpes Simplex Virus Type 1 and/or 2                    Antibodies, IgG CSF  0.89 IV or Less .......... Negative: No significant                             level of detectable HSV IgG                             antibody.  0.90 - 1.09 IV ........... Equivocal: Questionable                             presence of IgG antibodies.                             Repeat testing in 10-14 days                             may be helpful.  1.10 IV or Greater ....... Positive: IgG antibody to HSV                             detected, which may indicate                             a current or past HSV  infection. The detection of antibodies to herpes simplex virus in CSF may indicate central nervous system infection. However, consideration must be given to possible contamination by blood or transfer of serum antibodies across the blood-brain barrier. Fourfold or greater rise in CSF antibodies to herpes on specimens at l east 4 weeks apart are found in 74-94 % of patients with herpes encephalitis. Specificity of the test based on a single CSF testing is not established. Presently PCR is the primary means of establishing a diagnosis of herpes encephalitis. This test was developed and its performance characteristics determined by [employer]. It has not been cleared or approved by the [place] Food and Drug Administration. This test was performed in a CLIA certified laboratory and is intended for clinical purposes. Performed At: [facility] [address] [place], [place] [id number] Hilton Sinclair I MD  WU:[id number]       Scheduled Meds:  chlorhexidine  15 mL Mouth Rinse BID   Chlorhexidine Gluconate Cloth  6 each Topical Q0600   finasteride  5 mg Oral Daily   gabapentin  300 mg Oral BID   insulin aspart  0-5 Units Subcutaneous QHS   insulin aspart  0-9 Units Subcutaneous TID WC   lidocaine  1 patch Transdermal Q24H   mouth rinse  15 mL Mouth Rinse BID   sodium chloride flush  10-40 mL Intracatheter Q12H   traZODone  50 mg Oral QHS      LOS: 12 days   Cherene Altes, MD [facility] Office  [phone] Pager - Text Page per Shea Evans  If 7PM-7AM, please contact night-coverage per Amion 01/24/2021, 7:26 AM

## 2021-01-25 LAB — GLUCOSE, CAPILLARY
Glucose-Capillary: 152 mg/dL — ABNORMAL HIGH (ref 70–99)
Glucose-Capillary: 143 mg/dL — ABNORMAL HIGH (ref 70–99)
Glucose-Capillary: 160 mg/dL — ABNORMAL HIGH (ref 70–99)
Glucose-Capillary: 166 mg/dL — ABNORMAL HIGH (ref 70–99)

## 2021-01-25 LAB — COMPREHENSIVE METABOLIC PANEL
ALT: 28 U/L (ref 0–44)
AST: 22 U/L (ref 15–41)
Albumin: 3.9 g/dL (ref 3.5–5.0)
Alkaline Phosphatase: 45 U/L (ref 38–126)
Anion gap: 8 (ref 5–15)
BUN: 22 mg/dL (ref 8–23)
CO2: 23 mmol/L (ref 22–32)
Calcium: 9.4 mg/dL (ref 8.9–10.3)
Chloride: 106 mmol/L (ref 98–111)
Creatinine, Ser: 0.83 mg/dL (ref 0.61–1.24)
GFR, Estimated: >60 mL/min (ref >60)
Glucose, Bld: 131 mg/dL — ABNORMAL HIGH (ref 70–99)
Potassium: 4.1 mmol/L (ref 3.5–5.1)
Sodium: 137 mmol/L (ref 135–145)
Total Bilirubin: 0.9 mg/dL (ref 0.3–1.2)
Total Protein: 7.7 g/dL (ref 6.5–8.1)

## 2021-01-25 LAB — CBC
HCT: 33.6 % — ABNORMAL LOW (ref 39.0–52.0)
Hemoglobin: 11.1 g/dL — ABNORMAL LOW (ref 13.0–17.0)
MCH: 31.2 pg (ref 26.0–34.0)
MCHC: 33 g/dL (ref 30.0–36.0)
MCV: 94.4 fL (ref 80.0–100.0)
Platelets: 388 10*3/uL (ref 150–400)
RBC: 3.56 MIL/uL — ABNORMAL LOW (ref 4.22–5.81)
RDW: 15.9 % — ABNORMAL HIGH (ref 11.5–15.5)
WBC: 5.8 10*3/uL (ref 4.0–10.5)
nRBC: 0 % (ref 0.0–0.2)

## 2021-01-25 LAB — MAGNESIUM: Magnesium: 2.3 mg/dL (ref 1.7–2.4)

## 2021-01-25 MED ORDER — ONDANSETRON HCL 4 MG PO TABS: 4.0000 mg | ORAL_TABLET | Freq: Three times a day (TID) | ORAL | Status: DC | PRN

## 2021-01-25 MED ORDER — TAMSULOSIN HCL 0.4 MG PO CAPS
0.4000 mg | ORAL_CAPSULE | Freq: Every day | ORAL | Status: DC
  Administered 2021-01-26 – 2021-01-30 (×5): 0.4 mg via ORAL
  Filled 2021-01-25 (×6): qty 1

## 2021-01-25 NOTE — Progress Notes (Signed)
Joseph Banks  [id number]:[id number] DOB: [birth year] DOA: 01/12/2021 PCP: Glenda Chroman, MD    Brief Narrative:  75yo with a history of DM who had recently been diagnosed with prostatitis treated with ceftriaxone and bactrim but continued to experience worsening perianal pain with fever as high as 104. He was therefore changed to ciprofloxacin but symptoms persisted. CT at time of repeat presentation suggested a persistent bladder mass and enlarging prostate.  Significant Events:  Since admission the patient has been treated with antibiotic under the direction of the ID service.  His stay has been complicated by the development of new atrial fibrillation with RVR requiring medication titration.  He remained encephalopathic for an extended period of time before slowly regaining his cognition. 6/16 seen by Urology - UTI sx - rocephin IM + bactrim po 6/30 [facility] ED worsening pain/fever - CT abdom - cipro po 7/4 admit via [facility] > [facility] - persisting fevers/pain 7/4 transfer to ICU due to sepsis - encephalopathy 7/7 PICC line placement 7/8 LP under conscious sedation 7/13 MRI brain - no acute findings   Outpatient Follow-Up Issues: -will need outpatient Urology follow-up for foley cath removal -monitor for recurrent/paroxysmal Afib   Consultants:  Infectious Disease Urology PCCM IR  Code Status: FULL CODE  Antimicrobials:  Vanc 7/4 >> 7/7 Meropenem 7/4 >> 7/7 Rocehin 7/7 >> 7/9 Ampicillin 7/7 >> 7/9 Doxy 7/7 > 7/13 Acyclovir 7/9 > 7/13  DVT prophylaxis: SCDs  Subjective: Doing well. No complaints. Anxious to begin rehab on CIR.   Assessment & Plan:  Acute prostatitis with severe sepsis POA Sepsis resolved - no prostatic abscess noted on MRI - Foley placed by Urology on admission - has now defervesced - has completed a course of antibiotic therapy  Viral encephalitis - aseptic meningitis - toxic metabolic encephalopathy Status post LP 7/8 - HSV and Enterovirus PCR negative  therefore acyclovir stopped - meningitis and encephalitis panel negative per ID report - symptoms included AMS, photophobia, nuchal rigidity, agitation - very slowly improved initially, but then rapidly improved and now very nearly fully recovered - MRI brain 7/13 without acute findings - B12, folate, and ammonia normal   Back pain Felt to be due to prolonged bedrest - improving w/ increased motion   Dysphagia With improved mental status is now safe for regular diet - resolved   BPH Continue Foley per Urology - cont finasteride and flomax - with improving mobility expect foley can be discontinued for voiding trial once patient arrives on CIR   Newly diagnosed transient atrial fibrillation with RVR Precipitated by sepsis -TSH normal - magnesium normal - NSR throughout second portion of hospital stay -given onset during severe acute illness will not commit patient to lifelong anticoagulation for now - this will need to be monitored in outpatient follow-up  Acute kidney injury Felt to be related to bladder outlet obstruction which is now resolved with Foley - renal function has normalized  Thrombocytopenia Resolved - due to sepsis  Transaminitis Likely shock liver - LFTs have normalized  DM2 CBG well controlled   Family Communication: Spoke with wife and daughter at bedside at length Status is: Inpatient  Remains inpatient appropriate because:Inpatient level of care appropriate due to severity of illness  Dispo: The patient is from: Home              Anticipated d/c is to: CIR              Patient currently is medically stable to  d/c.   Difficult to place patient No   Objective: Blood pressure 140/83, pulse 88, temperature (!) 97.4 F (36.3 C), temperature source Oral, resp. rate 18, height 6\' 3"  (1.905 m), weight 97.4 kg, SpO2 98 %.  Intake/Output Summary (Last 24 hours) at 01/25/2021 0915 Last data filed at 01/25/2021 0947 Gross per 24 hour  Intake 1050 ml  Output 2801 ml   Net -1751 ml    Filed Weights   01/19/21 0652 01/20/21 0621 01/21/21 0500  Weight: 104.4 kg 101.7 kg 97.4 kg    Examination: General: No acute respiratory distress Lungs: CTA B  Cardiovascular: RRR without murmur or rub Abdomen: NT/ND, soft, BS positive Extremities: No edema bilateral lower extremities  CBC: Recent Labs  Lab 01/19/21 0545 01/20/21 0539 01/21/21 0623 01/22/21 0202 01/25/21 0315  WBC 8.3 8.2 7.5 7.0 5.8  NEUTROABS 2.5 3.3 3.3  --   --   HGB 9.7* 9.6* 9.8* 10.1* 11.1*  HCT 28.7* 29.0* 29.5* 29.8* 33.6*  MCV 92.6 92.9 93.4 93.4 94.4  PLT 235 274 339 366 096    Basic Metabolic Panel: Recent Labs  Lab 01/20/21 0539 01/21/21 0623 01/22/21 0202 01/25/21 0315  NA 137 136 136 137  K 3.7 4.0 4.2 4.1  CL 109 104 104 106  CO2 22 25 26 23   GLUCOSE 147* 158* 148* 131*  BUN 15 18 17 22   CREATININE 0.51* 0.58* 0.59* 0.83  CALCIUM 7.6* 9.0 9.3 9.4  MG 2.0 2.2 2.3 2.3  PHOS 2.7 3.2 3.3  --     GFR: Estimated Creatinine Clearance: 93.3 mL/min (by C-G formula based on SCr of 0.83 mg/dL).  Liver Function Tests: Recent Labs  Lab 01/20/21 0539 01/21/21 0623 01/22/21 0202 01/25/21 0315  AST 48* 35 30 22  ALT 54* 45* 36 28  ALKPHOS 40 42 41 45  BILITOT 0.8 1.0 1.0 0.9  PROT 5.7* 6.9 7.2 7.7  ALBUMIN 3.1* 4.1 4.2 3.9     CBG: Recent Labs  Lab 01/24/21 0735 01/24/21 1159 01/24/21 1635 01/24/21 2221 01/25/21 0752  GLUCAP 132* 139* 218* 137* 152*     Recent Results (from the past 240 hour(s))  CSF culture w Gram Stain     Status: None   Collection Time: 01/16/21  4:30 PM   Specimen: Cerebrospinal Fluid  Result Value Ref Range Status   Specimen Description   Final    LUMBAR Performed at [facility], [place] [address], [place], [place] [address]    Special Requests   Final    NONE Performed at [facility], [place] [address], [place], [place] [address]    Gram Stain   Final    WBC PRESENT, PREDOMINANTLY  MONONUCLEAR NO ORGANISMS SEEN CYTOSPIN SMEAR Gram Stain Report Called to,Read Back By and Verified With: S.WEAVER, RN AT 2153 ON 07.08.22 BY N.THOMPSON Performed at [place] [address], [place], [place] [address]    Culture   Final    NO GROWTH 3 DAYS Performed at [facility], [place] [address] [place], [place] [address]    Report Status 01/20/2021 FINAL  Final  Culture, fungus without smear     Status: None (Preliminary result)   Collection Time: 01/16/21  4:30 PM   Specimen: Cerebrospinal Fluid  Result Value Ref Range Status   Specimen Description   Final    LUMBAR Performed at [place] [address], [place],  [address]    Special Requests  Final    NONE Performed at [facility], [place] [address], [place], [place] [address]    Culture   Final    NO FUNGUS ISOLATED AFTER 8 DAYS Performed at [facility], [place] [address] [place], [place] [address]    Report Status PENDING  Incomplete  Anaerobic culture w Gram Stain     Status: None   Collection Time: 01/16/21  4:30 PM   Specimen: Cerebrospinal Fluid  Result Value Ref Range Status   Specimen Description   Final    LUMBAR Performed at [place] [address] [place], [place] [address]    Special Requests   Final    NONE Performed at [facility], [place] [address] [place], [place] [address]    Gram Stain   Final    WBC PRESENT, PREDOMINANTLY MONONUCLEAR NO ORGANISMS SEEN CYTOSPIN SMEAR    Culture   Final    NO ANAEROBES ISOLATED Performed at [facility], [place] [address], [place], [place] [address]    Report Status 01/22/2021 FINAL  Final  HSV 1/2 Ab IgG/IgM CSF     Status: None   Collection Time: 01/16/21  4:30 PM   Specimen: Cerebrospinal Fluid  Result Value Ref Range Status   HSV 1/2 Ab, IgM, CSF 0.44 <=0.89 IV Final    Comment: (NOTE) INTERPRETIVE INFORMATION: Herpes  Simplex Virus                          Type 1 and/or 2 Antibodies,                          IgM by ELISA, CSF  0.89 IV or Less .......... Negative: No significant                             level of detectable HSV IgM                             antibody.  0.90 - 1.09 IV ........... Equivocal: Questionable                             presence of IgM antibodies.                             Repeat testing in 10-14 days                             may be helpful.  1.10 IV or Greater ....... Positive: IgM antibody to HSV                             detected, which may indicate a                             current or recent infection.                             However, low levels of IgM  antibodies may occasionally                             persist for more than 12                             months post-infection. The detection of antibodies to herpes simplex virus in CSF may indicate central nervou s system infection. However, consideration must be given to possible contamination by blood or transfer of serum antibodies across the blood-brain barrier. Fourfold or greater rise in CSF antibodies to herpes on specimens at least 4 weeks apart are found in 74-94 % of patients with herpes encephalitis. Specificity of the test based on a single CSF testing is not established. Presently PCR is the primary means of establishing a diagnosis of herpes encephalitis. This test was developed and its performance characteristics determined by [employer]. It has not been cleared or approved by the [place] Food and Drug Administration. This test was performed in a CLIA certified laboratory and is intended for clinical purposes.    HSV 1/2 Ab Screen IgG, CSF 0.39 <=0.89 IV Final    Comment: (NOTE) INTERPRETIVE INFORMATION: Herpes Simplex Virus Type 1 and/or 2                    Antibodies, IgG CSF  0.89 IV or Less .......... Negative: No significant                              level of detectable HSV IgG                             antibody.  0.90 - 1.09 IV ........... Equivocal: Questionable                             presence of IgG antibodies.                             Repeat testing in 10-14 days                             may be helpful.  1.10 IV or Greater ....... Positive: IgG antibody to HSV                             detected, which may indicate                             a current or past HSV                             infection. The detection of antibodies to herpes simplex virus in CSF may indicate central nervous system infection. However, consideration must be given to possible contamination by blood or transfer of serum antibodies across the blood-brain barrier. Fourfold or greater rise in CSF antibodies to herpes on specimens at l east 4 weeks apart are found in 74-94 % of patients with herpes encephalitis. Specificity of the test based on a single CSF testing is not established.  Presently PCR is the primary means of establishing a diagnosis of herpes encephalitis. This test was developed and its performance characteristics determined by [employer]. It has not been cleared or approved by the [place] Food and Drug Administration. This test was performed in a CLIA certified laboratory and is intended for clinical purposes. Performed At: [facility] [address] [place], [place] [id number] Hilton Sinclair I MD LI:[id number]       Scheduled Meds:  alteplase  2 mg Intracatheter Once   chlorhexidine  15 mL Mouth Rinse BID   Chlorhexidine Gluconate Cloth  6 each Topical Q0600   finasteride  5 mg Oral Daily   gabapentin  300 mg Oral BID   insulin aspart  0-5 Units Subcutaneous QHS   insulin aspart  0-9 Units Subcutaneous TID WC   lidocaine  1 patch Transdermal Q24H   mouth rinse  15 mL Mouth Rinse BID   sodium chloride flush  10-40 mL Intracatheter Q12H   traZODone  50 mg Oral QHS      LOS: 13 days    Cherene Altes, MD [facility] Office  [phone] Pager - Text Page per Amion  If 7PM-7AM, please contact night-coverage per Amion 01/25/2021, 9:15 AM

## 2021-01-26 LAB — GLUCOSE, CAPILLARY
Glucose-Capillary: 135 mg/dL — ABNORMAL HIGH (ref 70–99)
Glucose-Capillary: 156 mg/dL — ABNORMAL HIGH (ref 70–99)
Glucose-Capillary: 146 mg/dL — ABNORMAL HIGH (ref 70–99)
Glucose-Capillary: 140 mg/dL — ABNORMAL HIGH (ref 70–99)

## 2021-01-26 MED ORDER — GABAPENTIN 250 MG/5ML PO SOLN
400.0000 mg | Freq: Two times a day (BID) | ORAL | Status: DC
  Administered 2021-01-26 – 2021-01-28 (×4): 400 mg via ORAL
  Filled 2021-01-26 (×4): qty 8

## 2021-01-26 NOTE — Care Management Important Message (Signed)
Important Message  Patient Details IM Letter given to the Patient. Name: Joseph Banks MRN: [id number] Date of Birth: [birth year]   Medicare Important Message Given:  Yes     Kerin Salen 01/26/2021, 12:40 PM

## 2021-01-26 NOTE — Progress Notes (Signed)
Physical Therapy Treatment Patient Details Name: Joseph Banks MRN: [id number] DOB: 06-03-1946 Today's Date: 01/26/2021    History of Present Illness Patient is a 75 year old male admitted with severe sepsis, Viral encephalitis, A-fib with RVR. Recently went to MD ~1 month ago due to perianal pain, was diagnosed with prostatitis and epididymitis and given antibiotics. However, symptoms worsened with developing fever, suspected viral encephalitis    PT Comments    Pt upright in recliner, NT stated she assisted him OOB and to recliner. Pt very motivated for session. Assisted pt from sit to stand to amb in hall. Mod assist +2 for safety to amb 40 feet in hall. Followed behind with recliner as pt stated he was feeling very tired and not strong. Cues needed for RW proximity, upright trunk and to look up and not at ground. Mild LOB during turning and cues needed to take small steps for safety. Pt returned to recliner in hallway as pt exhibited max signs of fatigue with slight knee buckling. Pt returned to room in recliner and positioned to comfort. Pt would benefit from continued PT to increase his functional independence and safety.   Follow Up Recommendations  CIR     Equipment Recommendations       Recommendations for Other Services Rehab consult     Precautions / Restrictions Precautions Precautions: Fall Restrictions Weight Bearing Restrictions: No    Mobility  Bed Mobility                    Transfers Overall transfer level: Needs assistance Equipment used: Rolling walker (2 wheeled) Transfers: Sit to/from Stand Sit to Stand: Min assist            Ambulation/Gait Ambulation/Gait assistance: +2 safety/equipment;Mod assist Gait Distance (Feet): 40 Feet Assistive device: Rolling walker (2 wheeled) Gait Pattern/deviations: Step-to pattern;Decreased stride length;Shuffle;Trunk flexed Gait velocity: decr   General Gait Details: cues needed for safety and RW  proximity and keeping trunk upright, mild LOB during turning, cues for small steps and keeping body inside RW. +2 for safety with recliner following behind. Distance limited by signs of fatigue and gait becoming unsteady.   Stairs             Wheelchair Mobility    Modified Rankin (Stroke Patients Only)       Balance Overall balance assessment: Needs assistance Sitting-balance support: No upper extremity supported Sitting balance-Leahy Scale: Fair                                      Cognition Arousal/Alertness: Awake/alert Behavior During Therapy: WFL for tasks assessed/performed                                   General Comments: A&O x3, very curious with intellegent questions, very pleasant.      Exercises      General Comments        Pertinent Vitals/Pain Pain Assessment: No/denies pain    Home Living                      Prior Function            PT Goals (current goals can now be found in the care plan section) Acute Rehab PT Goals Patient Stated Goal: go to rehab PT Goal Formulation:  With patient/family Time For Goal Achievement: 02/03/21 Potential to Achieve Goals: Fair Progress towards PT goals: Progressing toward goals    Frequency    Min 2X/week      PT Plan Current plan remains appropriate    Co-evaluation              AM-PAC PT "6 Clicks" Mobility   Outcome Measure  Help needed turning from your back to your side while in a flat bed without using bedrails?: A Lot Help needed moving from lying on your back to sitting on the side of a flat bed without using bedrails?: A Lot Help needed moving to and from a bed to a chair (including a wheelchair)?: Total Help needed standing up from a chair using your arms (e.g., wheelchair or bedside chair)?: Total Help needed to walk in hospital room?: Total Help needed climbing 3-5 steps with a railing? : Total 6 Click Score: 8    End of Session  Equipment Utilized During Treatment: Gait belt Activity Tolerance: Patient tolerated treatment well Patient left: in chair;with call bell/phone within reach;with family/visitor present Nurse Communication: Mobility status PT Visit Diagnosis: Unsteadiness on feet (R26.81);Difficulty in walking, not elsewhere classified (R26.2);Other abnormalities of gait and mobility (R26.89)     Time: 6606-0045 PT Time Calculation (min) (ACUTE ONLY): 20 min  Charges:  $Gait Training: 8-22 mins                     Ernst Spell, PTA Student  Acute Rehabilitation Services Pager : [phone] Office : [place] 01/26/2021, 1:47 PM

## 2021-01-26 NOTE — Progress Notes (Signed)
Joseph Banks  WRU:[id number] DOB: 1945/07/28 DOA: 01/12/2021 PCP: Glenda Chroman, MD    Brief Narrative:  75yo with a history of DM who had recently been diagnosed with prostatitis treated with ceftriaxone and bactrim but continued to experience worsening perianal pain with fever as high as 104. He was therefore changed to ciprofloxacin but symptoms persisted. CT at time of repeat presentation suggested a persistent bladder mass and enlarging prostate.  Significant Events:  Since admission the patient has been treated with antibiotic under the direction of the ID service.  His stay has been complicated by the development of new atrial fibrillation with RVR requiring medication titration.  He remained encephalopathic for an extended period of time before slowly regaining his cognition. 6/16 seen by Urology - UTI sx - rocephin IM + bactrim po 6/30 [facility] ED worsening pain/fever - CT abdom - cipro po 7/4 admit via [facility] ED > [facility] - persisting fevers/pain 7/4 transfer to ICU due to sepsis - encephalopathy 7/7 PICC line placement 7/8 LP under conscious sedation 7/13 MRI brain - no acute findings  7/17 PICC removed   Outpatient Follow-Up Issues: -will need Urology follow-up for foley cath removal if unable to be removed while on CIR  -monitor for recurrent/paroxysmal Afib   Consultants:  Infectious Disease Urology PCCM IR  Code Status: FULL CODE  Antimicrobials:  Vanc 7/4 >> 7/7 Meropenem 7/4 >> 7/7 Rocehin 7/7 >> 7/9 Ampicillin 7/7 >> 7/9 Doxy 7/7 > 7/13 Acyclovir 7/9 > 7/13  DVT prophylaxis: SCDs  Subjective: Sitting up in a bedside chair.  No new complaints.  Anxious to be admitted to rehab as soon as possible.  Assessment & Plan:  Acute prostatitis with severe sepsis POA Sepsis resolved - no prostatic abscess noted on MRI - Foley placed by Urology on admission - has now defervesced - has completed a course of antibiotic therapy - needs voiding trial when more ambulatory    Viral encephalitis - aseptic meningitis - toxic metabolic encephalopathy Status post LP 7/8 - HSV and Enterovirus PCR negative therefore acyclovir stopped - meningitis and encephalitis panel negative - symptoms included AMS, photophobia, nuchal rigidity, agitation - very slowly improved initially, but then rapidly improved and now very nearly fully recovered - MRI brain 7/13 without acute findings - B12, folate, and ammonia normal   Back pain Felt to be due to prolonged bedrest - improving w/ increased motion   Dysphagia With improved mental status is now safe for regular diet - resolved   BPH Continue Foley per Urology - cont finasteride and flomax - with improving mobility expect foley can be discontinued for voiding trial once patient arrives on CIR   Newly diagnosed transient atrial fibrillation with RVR Precipitated by sepsis -TSH normal - magnesium normal - NSR throughout second portion of hospital stay -given onset during severe acute illness will not commit patient to lifelong anticoagulation for now - this will need to be monitored in outpatient follow-up  Acute kidney injury Felt to be related to bladder outlet obstruction which is now resolved with Foley - renal function has normalized  Thrombocytopenia Resolved - due to sepsis  Transaminitis Likely shock liver - LFTs have normalized  DM2 CBG well controlled   Family Communication: Spoke with wife at bedside Status is: Inpatient  Remains inpatient appropriate because:Unsafe d/c plan  Dispo: The patient is from: Home              Anticipated d/c is to: CIR  Patient currently is medically stable to d/c.   Difficult to place patient No   Objective: Blood pressure 124/80, pulse 91, temperature 97.9 F (36.6 C), temperature source Oral, resp. rate 18, height 6\' 3"  (1.905 m), weight 97.4 kg, SpO2 99 %.  Intake/Output Summary (Last 24 hours) at 01/26/2021 0902 Last data filed at 01/26/2021 0900 Gross per  24 hour  Intake 1380 ml  Output 450 ml  Net 930 ml    Filed Weights   01/19/21 0652 01/20/21 0621 01/21/21 0500  Weight: 104.4 kg 101.7 kg 97.4 kg    Examination: General: No acute respiratory distress Lungs: CTA B  Cardiovascular: RRR without murmur or rub Abdomen: NT/ND, soft, BS positive Extremities: No edema bilateral lower extremities  CBC: Recent Labs  Lab 01/20/21 0539 01/21/21 0623 01/22/21 0202 01/25/21 0315  WBC 8.2 7.5 7.0 5.8  NEUTROABS 3.3 3.3  --   --   HGB 9.6* 9.8* 10.1* 11.1*  HCT 29.0* 29.5* 29.8* 33.6*  MCV 92.9 93.4 93.4 94.4  PLT 274 339 366 824    Basic Metabolic Panel: Recent Labs  Lab 01/20/21 0539 01/21/21 0623 01/22/21 0202 01/25/21 0315  NA 137 136 136 137  K 3.7 4.0 4.2 4.1  CL 109 104 104 106  CO2 22 25 26 23   GLUCOSE 147* 158* 148* 131*  BUN 15 18 17 22   CREATININE 0.51* 0.58* 0.59* 0.83  CALCIUM 7.6* 9.0 9.3 9.4  MG 2.0 2.2 2.3 2.3  PHOS 2.7 3.2 3.3  --     GFR: Estimated Creatinine Clearance: 93.3 mL/min (by C-G formula based on SCr of 0.83 mg/dL).  Liver Function Tests: Recent Labs  Lab 01/20/21 0539 01/21/21 0623 01/22/21 0202 01/25/21 0315  AST 48* 35 30 22  ALT 54* 45* 36 28  ALKPHOS 40 42 41 45  BILITOT 0.8 1.0 1.0 0.9  PROT 5.7* 6.9 7.2 7.7  ALBUMIN 3.1* 4.1 4.2 3.9     CBG: Recent Labs  Lab 01/25/21 0752 01/25/21 1143 01/25/21 1735 01/25/21 2049 01/26/21 0728  GLUCAP 152* 143* 160* 166* 135*     Recent Results (from the past 240 hour(s))  CSF culture w Gram Stain     Status: None   Collection Time: 01/16/21  4:30 PM   Specimen: Cerebrospinal Fluid  Result Value Ref Range Status   Specimen Description   Final    LUMBAR Performed at [facility], [place] [address] [place], [place] [address]    Special Requests   Final    NONE Performed at [facility], [place] [address], [place], [place] [address]    Gram Stain   Final    WBC PRESENT, PREDOMINANTLY  MONONUCLEAR NO ORGANISMS SEEN CYTOSPIN SMEAR Gram Stain Report Called to,Read Back By and Verified With: S.WEAVER, RN AT 2153 ON 07.08.22 BY N.THOMPSON Performed at [facility] [address] [place], [place] [address]    Culture   Final    NO GROWTH 3 DAYS Performed at [facility], [place] [address] [place], [place] [address]    Report Status 01/20/2021 FINAL  Final  Culture, fungus without smear     Status: None (Preliminary result)   Collection Time: 01/16/21  4:30 PM   Specimen: Cerebrospinal Fluid  Result Value Ref Range Status   Specimen Description   Final    LUMBAR Performed at [place] [address], [place], [place] [address]    Special Requests   Final  NONE Performed at [facility], [place] [address], [place], [place] [address]    Culture   Final    NO FUNGUS ISOLATED AFTER 8 DAYS Performed at [facility], [place] [address] [place], [place] [address]    Report Status PENDING  Incomplete  Anaerobic culture w Gram Stain     Status: None   Collection Time: 01/16/21  4:30 PM   Specimen: Cerebrospinal Fluid  Result Value Ref Range Status   Specimen Description   Final    LUMBAR Performed at [place] [address], [place], [place] [address]    Special Requests   Final    NONE Performed at [facility], [place] [address], [place], [place] [address]    Gram Stain   Final    WBC PRESENT, PREDOMINANTLY MONONUCLEAR NO ORGANISMS SEEN CYTOSPIN SMEAR    Culture   Final    NO ANAEROBES ISOLATED Performed at [facility], [place] [address] [place], [place] [address]    Report Status 01/22/2021 FINAL  Final  HSV 1/2 Ab IgG/IgM CSF     Status: None   Collection Time: 01/16/21  4:30 PM   Specimen: Cerebrospinal Fluid  Result Value Ref Range Status   HSV 1/2 Ab, IgM, CSF 0.44 <=0.89 IV Final    Comment: (NOTE) INTERPRETIVE INFORMATION: Herpes  Simplex Virus                          Type 1 and/or 2 Antibodies,                          IgM by ELISA, CSF  0.89 IV or Less .......... Negative: No significant                             level of detectable HSV IgM                             antibody.  0.90 - 1.09 IV ........... Equivocal: Questionable                             presence of IgM antibodies.                             Repeat testing in 10-14 days                             may be helpful.  1.10 IV or Greater ....... Positive: IgM antibody to HSV                             detected, which may indicate a                             current or recent infection.                             However, low levels of IgM  antibodies may occasionally                             persist for more than 12                             months post-infection. The detection of antibodies to herpes simplex virus in CSF may indicate central nervou s system infection. However, consideration must be given to possible contamination by blood or transfer of serum antibodies across the blood-brain barrier. Fourfold or greater rise in CSF antibodies to herpes on specimens at least 4 weeks apart are found in 74-94 % of patients with herpes encephalitis. Specificity of the test based on a single CSF testing is not established. Presently PCR is the primary means of establishing a diagnosis of herpes encephalitis. This test was developed and its performance characteristics determined by [employer]. It has not been cleared or approved by the [place] Food and Drug Administration. This test was performed in a CLIA certified laboratory and is intended for clinical purposes.    HSV 1/2 Ab Screen IgG, CSF 0.39 <=0.89 IV Final    Comment: (NOTE) INTERPRETIVE INFORMATION: Herpes Simplex Virus Type 1 and/or 2                    Antibodies, IgG CSF  0.89 IV or Less .......... Negative: No significant                              level of detectable HSV IgG                             antibody.  0.90 - 1.09 IV ........... Equivocal: Questionable                             presence of IgG antibodies.                             Repeat testing in 10-14 days                             may be helpful.  1.10 IV or Greater ....... Positive: IgG antibody to HSV                             detected, which may indicate                             a current or past HSV                             infection. The detection of antibodies to herpes simplex virus in CSF may indicate central nervous system infection. However, consideration must be given to possible contamination by blood or transfer of serum antibodies across the blood-brain barrier. Fourfold or greater rise in CSF antibodies to herpes on specimens at l east 4 weeks apart are found in 74-94 % of patients with herpes encephalitis. Specificity of the test based on a single CSF testing is not established.  Presently PCR is the primary means of establishing a diagnosis of herpes encephalitis. This test was developed and its performance characteristics determined by [employer]. It has not been cleared or approved by the [place] Food and Drug Administration. This test was performed in a CLIA certified laboratory and is intended for clinical purposes. Performed At: [facility] [address] [place], [place] [id number] Hilton Sinclair I MD [id number]       Scheduled Meds:  chlorhexidine  15 mL Mouth Rinse BID   Chlorhexidine Gluconate Cloth  6 each Topical Q0600   finasteride  5 mg Oral Daily   gabapentin  300 mg Oral BID   insulin aspart  0-9 Units Subcutaneous TID WC   lidocaine  1 patch Transdermal Q24H   mouth rinse  15 mL Mouth Rinse BID   tamsulosin  0.4 mg Oral Daily   traZODone  50 mg Oral QHS      LOS: 14 days   Cherene Altes, MD [facility] Office  [phone] Pager - Text Page per Amion  If  7PM-7AM, please contact night-coverage per Amion 01/26/2021, 9:02 AM

## 2021-01-26 NOTE — Progress Notes (Signed)
Inpatient Rehabilitation Admissions Coordinator   I await insurance approval to admit to CIR.   Danne Baxter, RN, MSN Rehab Admissions Coordinator [phone] 01/26/2021 12:34 PM

## 2021-01-27 LAB — GLUCOSE, CAPILLARY
Glucose-Capillary: 128 mg/dL — ABNORMAL HIGH (ref 70–99)
Glucose-Capillary: 147 mg/dL — ABNORMAL HIGH (ref 70–99)
Glucose-Capillary: 153 mg/dL — ABNORMAL HIGH (ref 70–99)
Glucose-Capillary: 138 mg/dL — ABNORMAL HIGH (ref 70–99)

## 2021-01-27 MED ORDER — GLUCERNA SHAKE PO LIQD
237.0000 mL | Freq: Three times a day (TID) | ORAL | Status: DC
  Administered 2021-01-27 – 2021-01-30 (×8): 237 mL via ORAL
  Filled 2021-01-27 (×10): qty 237

## 2021-01-27 MED ORDER — ZOLPIDEM TARTRATE 5 MG PO TABS
5.0000 mg | ORAL_TABLET | Freq: Every day | ORAL | Status: DC
  Administered 2021-01-27 – 2021-01-29 (×3): 5 mg via ORAL
  Filled 2021-01-27 (×3): qty 1

## 2021-01-27 MED ORDER — METFORMIN HCL 500 MG PO TABS
500.0000 mg | ORAL_TABLET | Freq: Two times a day (BID) | ORAL | Status: DC
  Administered 2021-01-27 – 2021-01-30 (×7): 500 mg via ORAL
  Filled 2021-01-27 (×7): qty 1

## 2021-01-27 MED ORDER — CHLORHEXIDINE GLUCONATE CLOTH 2 % EX PADS
6.0000 | MEDICATED_PAD | Freq: Every day | CUTANEOUS | Status: DC
  Administered 2021-01-27 – 2021-01-29 (×3): 6 via TOPICAL

## 2021-01-27 NOTE — Progress Notes (Signed)
Joseph Banks  FBP:[id number] DOB: January 04, 1946 DOA: 01/12/2021 PCP: Glenda Chroman, MD    Brief Narrative:  75yo with a history of DM who had recently been diagnosed with prostatitis treated with ceftriaxone and bactrim but continued to experience worsening perianal pain with fever as high as 104. He was therefore changed to ciprofloxacin but symptoms persisted. CT at time of repeat presentation suggested a persistent bladder mass and enlarging prostate.  Significant Events:  Since admission the patient has been treated with antibiotic under the direction of the ID service.  His stay has been complicated by the development of new atrial fibrillation with RVR requiring medication titration.  He remained encephalopathic for an extended period of time before slowly regaining his cognition. 6/16 seen by Urology - UTI sx - rocephin IM + bactrim po 6/30 [facility] ED worsening pain/fever - CT abdom - cipro po 7/4 admit via [facility] ED > [facility] - persisting fevers/pain 7/4 transfer to ICU due to sepsis - encephalopathy 7/7 PICC line placement 7/8 LP under conscious sedation 7/13 MRI brain - no acute findings  7/17 PICC removed   Outpatient Follow-Up Issues: -will need Urology follow-up for foley cath removal if unable to be removed while on CIR  -monitor for recurrent/paroxysmal Afib   Consultants:  Infectious Disease Urology PCCM IR  Code Status: FULL CODE  Antimicrobials:  Vanc 7/4 >> 7/7 Meropenem 7/4 >> 7/7 Rocehin 7/7 >> 7/9 Ampicillin 7/7 >> 7/9 Doxy 7/7 > 7/13 Acyclovir 7/9 > 7/13  DVT prophylaxis: SCDs  Subjective: Having difficulty sleeping at night.  Otherwise doing great with no new complaints.  Assessment & Plan:  Acute prostatitis with severe sepsis POA Sepsis resolved - no prostatic abscess noted on MRI - Foley placed by Urology on admission - has now defervesced - has completed a course of antibiotic therapy - needs voiding trial when more ambulatory   Viral encephalitis  - aseptic meningitis - toxic metabolic encephalopathy Status post LP 7/8 - HSV and Enterovirus PCR negative therefore acyclovir stopped - meningitis and encephalitis panel negative - symptoms included AMS, photophobia, nuchal rigidity, agitation - very slowly improved initially, but then rapidly improved and now very nearly fully recovered - MRI brain 7/13 without acute findings - B12, folate, and ammonia normal   Back pain Felt to be due to prolonged bedrest - improving w/ increased motion   Dysphagia With improved mental status is now safe for regular diet - resolved   BPH Continue Foley per Urology - cont finasteride and flomax - with improving mobility expect foley can be discontinued for voiding trial once patient arrives on CIR   Newly diagnosed transient atrial fibrillation with RVR Precipitated by sepsis -TSH normal - magnesium normal - NSR throughout second portion of hospital stay -given onset during severe acute illness will not commit patient to lifelong anticoagulation for now - this will need to be monitored in outpatient follow-up  Acute kidney injury Felt to be related to bladder outlet obstruction which is now resolved with Foley - renal function has normalized  Thrombocytopenia Resolved - due to sepsis  Transaminitis Likely shock liver - LFTs have normalized  DM2 CBG well controlled  Insomnia  Stop trazadone which is not helping - give trial of ambien 5mg    Family Communication: Spoke with wife at bedside Status is: Inpatient  Remains inpatient appropriate because:Unsafe d/c plan  Dispo: The patient is from: Home              Anticipated  d/c is to: CIR              Patient currently is medically stable to d/c.   Difficult to place patient No   Objective: Blood pressure 108/81, pulse 90, temperature 97.7 F (36.5 C), temperature source Oral, resp. rate 18, height 6\' 3"  (1.905 m), weight 97.4 kg, SpO2 98 %.  Intake/Output Summary (Last 24 hours) at  01/27/2021 0718 Last data filed at 01/27/2021 0600 Gross per 24 hour  Intake 1080 ml  Output 2300 ml  Net -1220 ml    Filed Weights   01/19/21 0652 01/20/21 0621 01/21/21 0500  Weight: 104.4 kg 101.7 kg 97.4 kg    Examination: General: No acute respiratory distress Lungs: CTA B  Cardiovascular: RRR   CBC: Recent Labs  Lab 01/21/21 0623 01/22/21 0202 01/25/21 0315  WBC 7.5 7.0 5.8  NEUTROABS 3.3  --   --   HGB 9.8* 10.1* 11.1*  HCT 29.5* 29.8* 33.6*  MCV 93.4 93.4 94.4  PLT 339 366 376    Basic Metabolic Panel: Recent Labs  Lab 01/21/21 0623 01/22/21 0202 01/25/21 0315  NA 136 136 137  K 4.0 4.2 4.1  CL 104 104 106  CO2 25 26 23   GLUCOSE 158* 148* 131*  BUN 18 17 22   CREATININE 0.58* 0.59* 0.83  CALCIUM 9.0 9.3 9.4  MG 2.2 2.3 2.3  PHOS 3.2 3.3  --     GFR: Estimated Creatinine Clearance: 93.3 mL/min (by C-G formula based on SCr of 0.83 mg/dL).  Liver Function Tests: Recent Labs  Lab 01/21/21 0623 01/22/21 0202 01/25/21 0315  AST 35 30 22  ALT 45* 36 28  ALKPHOS 42 41 45  BILITOT 1.0 1.0 0.9  PROT 6.9 7.2 7.7  ALBUMIN 4.1 4.2 3.9     CBG: Recent Labs  Lab 01/25/21 2049 01/26/21 0728 01/26/21 1117 01/26/21 1615 01/26/21 2147  GLUCAP 166* 135* 156* 146* 140*     No results found for this or any previous visit (from the past 240 hour(s)).     Scheduled Meds:  Chlorhexidine Gluconate Cloth  6 each Topical Q0600   finasteride  5 mg Oral Daily   gabapentin  400 mg Oral BID   insulin aspart  0-9 Units Subcutaneous TID WC   lidocaine  1 patch Transdermal Q24H   tamsulosin  0.4 mg Oral Daily   traZODone  50 mg Oral QHS      LOS: 15 days   Cherene Altes, MD [facility] Office  [phone] Pager - Text Page per Shea Evans  If 7PM-7AM, please contact night-coverage per Amion 01/27/2021, 7:18 AM

## 2021-01-27 NOTE — Progress Notes (Signed)
Occupational Therapy Treatment Patient Details Name: SHAHEED SCHMUCK MRN: [id number] DOB: 10-13-1945 Today's Date: 01/27/2021    History of present illness Patient is a 75 year old male admitted with severe sepsis, Viral encephalitis, A-fib with RVR. Recently went to MD ~1 month ago due to perianal pain, was diagnosed with prostatitis and epididymitis and given antibiotics. However, symptoms worsened with developing fever, suspected viral encephalitis   OT comments  This OT had not worked with patient since being in ICU, had demonstrated great improvements both with cognition and functional mobility. Patient is alert, oriented, following directions appropriately, showing insight to remaining deficits and asking how he can work on strength between therapy sessions. Patient overall min A with rolling walker for functional ambulation in room, note L knee buckling twice therefore still fall risk and needing min A for safety/balance. Patient then instructed in upper extremity exercises with  theraband and provided hand out. Gave cues for proper technique. Continue to recommend CIR at D/C from hospital as patient demonstrates great rehab potential, is highly motivated and has great family support.    Follow Up Recommendations  CIR    Equipment Recommendations  Other (comment) (TBD)       Precautions / Restrictions Precautions Precautions: Fall Restrictions Weight Bearing Restrictions: No       Mobility Bed Mobility               General bed mobility comments: in recliner    Transfers Overall transfer level: Needs assistance Equipment used: Rolling walker (2 wheeled) Transfers: Sit to/from Stand Sit to Stand: Min assist         General transfer comment: performed three sit to stand from recliner chair, min A for balance due to L knee buckling twice during ambulation    Balance Overall balance assessment: Needs assistance Sitting-balance support: Feet supported Sitting  balance-Leahy Scale: Good     Standing balance support: Bilateral upper extremity supported Standing balance-Leahy Scale: Poor Standing balance comment: reliant on upper extremitie support and min A                           ADL either performed or assessed with clinical judgement   ADL Overall ADL's : Needs assistance/impaired                     Lower Body Dressing: Set up;Sitting/lateral leans Lower Body Dressing Details (indicate cue type and reason): patient able to doff socks and don sneakers seated in recliner without any assistance Toilet Transfer: Minimal assistance;Ambulation;RW Toilet Transfer Details (indicate cue type and reason): patient is much improved with sit to stand from recliner. Patient stood 3 times in order to safely measure walker height. Then participate in functional ambulation in room ~72ft total. Min A for safety due to L knee buckling noted.         Functional mobility during ADLs: Minimal assistance;Rolling walker General ADL Comments: patient much more clear cognitively, highly motivated to get to CIR and continue with intensive therapy wanting to rebuild strength "my muscles have atrophied"      Cognition Arousal/Alertness: Awake/alert Behavior During Therapy: WFL for tasks assessed/performed Overall Cognitive Status: Within Functional Limits for tasks assessed                                 General Comments: patient is much different than when this OT last saw  patient in ICU. is alert and oriented, demonstrating insight to current deficits and asking questions about what he can do in down time to work on strengthening. Following all directions appropriately.        Exercises Exercises: General Upper Extremity General Exercises - Upper Extremity Shoulder Flexion: Strengthening;Both;10 reps;Seated;Theraband Theraband Level (Shoulder Flexion): Level 4 (Blue) Shoulder Extension: Strengthening;Both;10  reps;Seated;Theraband Theraband Level (Shoulder Extension): Level 4 (Blue) Shoulder ABduction: Strengthening;Both;10 reps;Theraband;Seated Theraband Level (Shoulder Abduction): Level 4 (Blue) Elbow Flexion: Strengthening;Both;10 reps;Seated;Theraband Theraband Level (Elbow Flexion): Level 4 (Blue) Elbow Extension: Strengthening;10 reps;Both;Seated;Theraband Theraband Level (Elbow Extension): Level 4 (Blue)           Pertinent Vitals/ Pain       Pain Assessment: Faces Faces Pain Scale: Hurts little more Pain Location: L bicep Pain Descriptors / Indicators: Sore Pain Intervention(s): Monitored during session         Frequency  Min 2X/week        Progress Toward Goals  OT Goals(current goals can now be found in the care plan section)  Progress towards OT goals: Progressing toward goals  Acute Rehab OT Goals Patient Stated Goal: go to CIR OT Goal Formulation: With family Time For Goal Achievement: 02/03/21 Potential to Achieve Goals: Good ADL Goals Pt Will Perform Upper Body Bathing: with supervision;sitting Pt Will Transfer to Toilet: with supervision;ambulating (walker) Additional ADL Goal #1: Patient will tolerate 10 minute standing activity at supervision level in order to participate in daily routine  Plan Discharge plan remains appropriate       AM-PAC OT "6 Clicks" Daily Activity     Outcome Measure   Help from another person eating meals?: None Help from another person taking care of personal grooming?: A Little Help from another person toileting, which includes using toliet, bedpan, or urinal?: A Lot Help from another person bathing (including washing, rinsing, drying)?: A Lot Help from another person to put on and taking off regular upper body clothing?: A Little Help from another person to put on and taking off regular lower body clothing?: A Little 6 Click Score: 17    End of Session Equipment Utilized During Treatment: Rolling walker;Gait belt  OT  Visit Diagnosis: Unsteadiness on feet (R26.81);Other abnormalities of gait and mobility (R26.89);Muscle weakness (generalized) (M62.81);Other symptoms and signs involving cognitive function Pain - Right/Left: Left Pain - part of body: Arm (bicep)   Activity Tolerance Patient tolerated treatment well   Patient Left in chair;with call bell/phone within reach;with family/visitor present   Nurse Communication Mobility status        Time: 9702-6378 OT Time Calculation (min): 38 min  Charges: OT General Charges $OT Visit: 1 Visit OT Treatments $Self Care/Home Management : 23-37 mins $Therapeutic Exercise: 8-22 mins  Delbert Phenix OT OT pager: [phone]   Rosemary Holms 01/27/2021, 10:36 AM

## 2021-01-27 NOTE — Progress Notes (Signed)
Nutrition Follow-up  INTERVENTION:   -Glucerna Shake po TID, each supplement provides 220 kcal and 10 grams of protein  NUTRITION DIAGNOSIS:   Increased nutrient needs related to acute illness as evidenced by estimated needs.  Ongoing.  GOAL:   Patient will meet greater than or equal to 90% of their needs  Improving.  MONITOR:   PO intake, Supplement acceptance, Labs, Weight trends, I & O's  REASON FOR ASSESSMENT:   Consult Enteral/tube feeding initiation and management  ASSESSMENT:   75 y.o. male with history of DM and BPH. He was experiencing perianal pain on 6/16 and was diagnosed with prostatitis and epididymitis. On 6/30 he went to the ED at [facility] with complaints of worsening pain and fever up to 104 degrees. He has been having increasing difficulty urinating with urine becoming increasingly dark.  7/4: admitted 7/15: TF stopped,  NGT removed, diet advanced  Patient now tolerating oral diet. Consuming 100% of meals at this time.  Will order Glucerna shakes given increased needs.  Admission weight: 223 lbs. Last recorded weight 7/13: 214 lbs.  Medications reviewed.  Labs reviewed: CBGs: 128-156  Diet Order:   Diet Order             DIET DYS 3 Room service appropriate? Yes; Fluid consistency: Thin  Diet effective now                   EDUCATION NEEDS:   No education needs have been identified at this time  Skin:  Skin Assessment: Reviewed RN Assessment  Last BM:  7/17  Height:   Ht Readings from Last 1 Encounters:  01/12/21 6\' 3"  (1.905 m)    Weight:   Wt Readings from Last 1 Encounters:  01/21/21 97.4 kg    Ideal Body Weight:  89.1 kg  BMI:  Body mass index is 26.84 kg/m.  Estimated Nutritional Needs:   Kcal:  2350-2600 kcal  Protein:  120-135 grams  Fluid:  >/= 2.5 L/day   Clayton Bibles, MS, RD, LDN Inpatient Clinical Dietitian Contact information available via Amion

## 2021-01-27 NOTE — Progress Notes (Signed)
Inpatient Rehabilitation Admissions Coordinator   I await insurance determination for a possible Cir admit. I have sent updated clinicals form yesterday. Began Auth on Friday morning.  Danne Baxter, RN, MSN Rehab Admissions Coordinator [phone] 01/27/2021 12:06 PM

## 2021-01-27 NOTE — Progress Notes (Signed)
Physical Therapy Treatment Patient Details Name: Joseph Banks MRN: [id number] DOB: [birth year] Today's Date: 01/27/2021    History of Present Illness 75yo with a history of DM who had recently been diagnosed with prostatitis treated with ceftriaxone and bactrim but continued to experience worsening perianal pain with fever as high as 104. He was therefore changed to ciprofloxacin but symptoms persisted. CT at time of repeat presentation suggested a persistent bladder mass and enlarging prostate.     Significant Events:  Since admission the patient has been treated with antibiotic under the direction of the ID service.  His stay has been complicated by the development of new atrial fibrillation with RVR requiring medication titration.  He remained encephalopathic for an extended period of time before slowly regaining his cognition.  6/16 seen by Urology - UTI sx - rocephin IM + bactrim po  6/30 [facility] ED worsening pain/fever - CT abdom - cipro po  7/4 admit via [facility] > [facility] - persisting fevers/pain  7/4 transfer to ICU due to sepsis - encephalopathy  7/7 PICC line placement  7/8 LP under conscious sedation  7/13 MRI brain - no acute findings  7/17 PICC removed    PT Comments    Pt progressing well but still has a way to go.  Assisted out of bathroom to amb in hallway then performed some B LE TE's.  Pt is deconditioned from his extended hospital stay but making progress.  Pt would benefit from aggressive Rehab.    Follow Up Recommendations  CIR     Equipment Recommendations       Recommendations for Other Services Rehab consult     Precautions / Restrictions Precautions Precautions: Fall Precaution Comments: anterior lean with standing    Mobility  Bed Mobility               General bed mobility comments: OOB in bathroom    Transfers Overall transfer level: Needs assistance Equipment used: Rolling walker (2 wheeled) Transfers: Sit to/from [facility] Sit to  Stand: Min assist Stand pivot transfers: Min assist;Mod assist       General transfer comment: 25% VC's on proper hand placeemnt and safety with turn completion prior to sit.  Ambulation/Gait Ambulation/Gait assistance: +2 safety/equipment;Mod assist;Min assist Gait Distance (Feet): 55 Feet Assistive device: Rolling walker (2 wheeled) Gait Pattern/deviations: Step-to pattern;Decreased stride length;Shuffle;Trunk flexed Gait velocity: decreased   General Gait Details: tolerated an increased distance.  25% VC's on proper walker use, proper walker to self distance as well as sfety with turns.  Recliner following for safety.   Stairs             Wheelchair Mobility    Modified Rankin (Stroke Patients Only)       Balance                                            Cognition Arousal/Alertness: Awake/alert Behavior During Therapy: WFL for tasks assessed/performed Overall Cognitive Status: Within Functional Limits for tasks assessed                                 General Comments: AxO x 3 very pleasant with increased cognition and self medical insight.      Exercises      General Comments  Pertinent Vitals/Pain Pain Assessment: No/denies pain    Home Living                      Prior Function            PT Goals (current goals can now be found in the care plan section) Progress towards PT goals: Progressing toward goals    Frequency    Min 2X/week      PT Plan Current plan remains appropriate    Co-evaluation              AM-PAC PT "6 Clicks" Mobility   Outcome Measure  Help needed turning from your back to your side while in a flat bed without using bedrails?: A Lot Help needed moving from lying on your back to sitting on the side of a flat bed without using bedrails?: A Lot Help needed moving to and from a bed to a chair (including a wheelchair)?: Total Help needed standing up from a chair  using your arms (e.g., wheelchair or bedside chair)?: Total Help needed to walk in hospital room?: Total Help needed climbing 3-5 steps with a railing? : Total 6 Click Score: 8    End of Session Equipment Utilized During Treatment: Gait belt Activity Tolerance: Patient tolerated treatment well Patient left: in chair;with call bell/phone within reach;with family/visitor present Nurse Communication: Mobility status PT Visit Diagnosis: Unsteadiness on feet (R26.81);Difficulty in walking, not elsewhere classified (R26.2);Other abnormalities of gait and mobility (R26.89)     Time: 1420-1440 PT Time Calculation (min) (ACUTE ONLY): 20 min  Charges:  $Gait Training: 8-22 mins                    Rica Koyanagi  PTA Acute  Rehabilitation Services Pager      [phone] Office      [phone]

## 2021-01-28 LAB — GLUCOSE, CAPILLARY
Glucose-Capillary: 126 mg/dL — ABNORMAL HIGH (ref 70–99)
Glucose-Capillary: 101 mg/dL — ABNORMAL HIGH (ref 70–99)
Glucose-Capillary: 164 mg/dL — ABNORMAL HIGH (ref 70–99)
Glucose-Capillary: 142 mg/dL — ABNORMAL HIGH (ref 70–99)

## 2021-01-28 MED ORDER — GABAPENTIN 250 MG/5ML PO SOLN
600.0000 mg | Freq: Two times a day (BID) | ORAL | Status: DC
  Administered 2021-01-28 – 2021-01-30 (×4): 600 mg via ORAL
  Filled 2021-01-28 (×4): qty 12

## 2021-01-28 MED ORDER — TRAMADOL HCL 50 MG PO TABS
50.0000 mg | ORAL_TABLET | Freq: Four times a day (QID) | ORAL | Status: DC | PRN
  Administered 2021-01-29 (×2): 50 mg via ORAL
  Filled 2021-01-28 (×2): qty 1

## 2021-01-28 NOTE — Progress Notes (Signed)
Inpatient Rehabilitation Admissions Coordinator   I have received a denial from [facility] for CIR admit after peer to peer with Dr. Billee Cashing clung. I have spoken with pt's wife at his bedside by phone and she is aware. She has requested an appeal which I will initiate once I have updated PT and OT assessments as requested by TOC. Acute team and TOC made aware.  Danne Baxter, RN, MSN Rehab Admissions Coordinator [phone] 01/28/2021 2:10 PM

## 2021-01-28 NOTE — Progress Notes (Signed)
Mobility Specialist - Progress Note    01/28/21 1357  Mobility  Activity Ambulated in hall  Level of Assistance Minimal assist, patient does 75% or more  Assistive Device Front wheel walker  Distance Ambulated (ft) 110 ft  Mobility Ambulated with assistance in hallway  Mobility Response Tolerated well  Mobility performed by Mobility specialist;Family member  $Mobility charge 1 Mobility    Pt ambulated ~110 ft in hallway using RW and Min A from mobility specialist. Pt's family accompanied pt on walk. Pt did not c/o of pain or dizziness and tolerated session well. Pt practiced pursed breathing toward end of ambulation to help regulate breathing. Pt returned to EOB after ambulation and insisted on sitting up. Pt left with call bell at side and family in room.   [place] Specialist Acute Rehabilitation Services Phone: [phone] 01/28/21, 2:02 PM

## 2021-01-28 NOTE — PMR Pre-admission (Signed)
PMR Admission Coordinator Pre-Admission Assessment  Patient: Joseph Banks is an 75 y.o., male MRN: [id number] DOB: 01/18/1946 Height: _0  (190.5 cm) Weight: 97.4 kg  Insurance Information HMO:     PPO: yes     PCP:      IPA:      80/20:      OTHER:  PRIMARY: [employer] Medicare      Policy#: [id number]      Subscriber: pt CM Name: Jeani Hawking       Phone#:      Fax#: [phone] Pre-Cert#: [id number] auth for CIR provided through expedited appeal from [facility] with [facility].  Updates due on 7/26 to Dewitt Rota (phone [phone]) at fax listed above.     Employer:  Benefits:  Phone #: [phone]     Name: 7/15 Eff. Date: 07/12/2020     Deduct: none      Out of Pocket Max: $5900      Life Max: none CIR: $375 co pay per day days 1 until 5      SNF: no copay days 1 until 20; $188 co pay per day days 21 until 100 Outpatient: $35 per visit     Co-Pay: visits per medical neccesity Home Health: 100%      Co-Pay: visits per medical neccesity DME: 80%     Co-Pay: 20% Providers: in network  SECONDARY: none  Financial Counselor:       Phone#:   The Engineer, petroleum" for patients in Inpatient Rehabilitation Facilities with attached "Privacy Act [place] Records" was provided and verbally reviewed with: Patient and Family  Emergency Contact Information Contact Information     Name Relation Home Work [employer] [place] Spouse [phone]  [phone]   [place]a [place] Daughter   [phone]   Masaichi, Kracht   [phone]       Current Medical History  Patient Admitting Diagnosis: debility  History of Present Illness:   75 year old male with history of diabetes who has been experiencing perianal pain and seen by Dr Jeffie Pollock urologist on June 16 and diagnosed with prostatitis and epididymitis. Treated with ceftriaxone and Bactrim. Worsening symptoms and went to ED at [facility] with fever of 104 with difficulty urinating and darker urine. CT  suggested a persistence bladder mass and enlarging prostate.   Presented on 01/12/2021 to [facility] and ID service consulted and antibiotic coverage under their direction. Felt acute prostatitis with severe sepsis. Sepsis resolved and no prostatic abscess noted on MRI. Foley placed by Urology and completed course of antibiotic. Need voiding trials when more ambulatory.   Status LP 7/8 with HSV and Enterovirus PCR negative therefore Acyclovir stopped. Meningitis and encephalitis panels negative. Symptoms included photophobia, nuchal rigidity , agitation have resolved. MRI brain 7.13 without acute findings. B12, folate and ammonia levels normal.  Urology consulted and placed foley. Continue Finasteride and flomax.   Patient's medical record from [facility] has been reviewed by the rehabilitation admission coordinator and physician.  Past Medical History  Past Medical History:  Diagnosis Date   BPH (benign prostatic hyperplasia)    Diabetes mellitus without complication ([place])     Family History   family history is not on file.  Prior Rehab/Hospitalizations Has the patient had prior rehab or hospitalizations prior to admission? Yes  Has the patient had major surgery during 100 days prior to admission? No   Current Medications  Current Facility-Administered Medications:    acetaminophen (TYLENOL) tablet 650 mg, 650 mg, Oral,  Q6H PRN, Cherene Altes, MD, 650 mg at 01/30/21 5093   Chlorhexidine Gluconate Cloth 2 % PADS 6 each, 6 each, Topical, Daily, Cherene Altes, MD, 6 each at 01/29/21 1115   feeding supplement (GLUCERNA SHAKE) (GLUCERNA SHAKE) liquid 237 mL, 237 mL, Oral, TID BM, Cherene Altes, MD, 237 mL at 01/30/21 0856   finasteride (PROSCAR) tablet 5 mg, 5 mg, Oral, Daily, Festus Aloe, MD, 5 mg at 01/30/21 0855   gabapentin (NEURONTIN) 250 MG/5ML solution 600 mg, 600 mg, Oral, BID, Cherene Altes, MD, 600 mg at 01/30/21 0855   lidocaine (LIDODERM) 5 % 1  patch, 1 patch, Transdermal, Q24H, Carlyle Basques, MD, 1 patch at 01/29/21 2108   lip balm (CARMEX) ointment, , Topical, PRN, Dwyane Dee, MD   metFORMIN (GLUCOPHAGE) tablet 500 mg, 500 mg, Oral, BID WC, Cherene Altes, MD, 500 mg at 01/30/21 0855   ondansetron (ZOFRAN) tablet 4 mg, 4 mg, Oral, Q8H PRN, Cherene Altes, MD   polyvinyl alcohol (LIQUIFILM TEARS) 1.4 % ophthalmic solution 2 drop, 2 drop, Both Eyes, PRN, Dwyane Dee, MD   tamsulosin (FLOMAX) capsule 0.4 mg, 0.4 mg, Oral, Daily, Joette Catching T, MD, 0.4 mg at 01/30/21 0856   traMADol (ULTRAM) tablet 50 mg, 50 mg, Oral, Q6H PRN, Cherene Altes, MD, 50 mg at 01/29/21 1904   zolpidem (AMBIEN) tablet 5 mg, 5 mg, Oral, QHS, Cherene Altes, MD, 5 mg at 01/29/21 2107  Patients Current Diet:  Diet Order             DIET DYS 3 Room service appropriate? Yes; Fluid consistency: Thin  Diet effective now                   Precautions / Restrictions Precautions Precautions: Fall Precaution Comments: anterior lean with standing Restrictions Weight Bearing Restrictions: No   Has the patient had 2 or more falls or a fall with injury in the past year? No  Prior Activity Level Community (5-7x/wk): Independent, active and retired  Prior Functional Level Self Care: Did the patient need help bathing, dressing, using the toilet or eating? Independent  Indoor Mobility: Did the patient need assistance with walking from room to room (with or without device)? Independent  Stairs: Did the patient need assistance with internal or external stairs (with or without device)? Independent  Functional Cognition: Did the patient need help planning regular tasks such as shopping or remembering to take medications? Independent  Home Assistive Devices / Equipment Home Assistive Devices/Equipment: None  Prior Device Use: Indicate devices/aids used by the patient prior to current illness, exacerbation or injury? Manual  wheelchair  Current Functional Level Cognition  Arousal/Alertness: Awake/alert Overall Cognitive Status: Impaired/Different from baseline Current Attention Level: Sustained Orientation Level: Oriented X4 Following Commands: Follows one step commands inconsistently Safety/Judgement: Decreased awareness of safety, Decreased awareness of deficits General Comments: AxO x 3 very pleasant with increased cognition and self medical insight. Memory:  (recalled 5/5 of items without cues, used compensation strategies of visualization and chunking together that his father taught him) Awareness: Impaired Awareness Impairment: Intellectual impairment (admits to some tasks being more challenging but also appears to try to diminish this with report of behaviors, etc) Problem Solving: Impaired Problem Solving Impairment: Verbal complex, Functional complex Executive Function: Self Monitoring, Self Correcting Self Monitoring: Impaired Self Monitoring Impairment: Verbal complex Self Correcting: Impaired Self Correcting Impairment: Verbal complex Comments: Pt able to verbalize purpose of testing is "to improve QOL" after he indicated  his score on the SLUMS indicating "failing".  He recognized testing purpose was to find areas of focus for cognitive linguistic rehab to maximize his cognitive rehab to decrease caregiver burden. On SLUMS testing, visuospatial skills impacted as pt needed self correction x2 and he placed numbers on the outside of the clock without self correction.  Detailed information missed by pt resulting in answering functional math question, clock drawing and narrative questions incorrectly.  With repetition of math question at end of testing - pt able to easily answer correctly.    Extremity Assessment (includes Sensation/Coordination)  Upper Extremity Assessment: Generalized weakness  Lower Extremity Assessment: Generalized weakness    ADLs  Overall ADL's : Needs  assistance/impaired Eating/Feeding: NPO Grooming: Standing, Min guard, Oral care Grooming Details (indicate cue type and reason): patient min guard to stand at sink and perform oral care for 1 min and 30 seconds. Upper Body Bathing: Maximal assistance, Sitting Upper Body Bathing Details (indicate cue type and reason): poor/unsafe balance Lower Body Bathing: Total assistance Upper Body Dressing : Maximal assistance, Bed level, Sitting Lower Body Dressing: Min guard, Sit to/from stand Lower Body Dressing Details (indicate cue type and reason): Patient able to don socks from seated postion. Toilet Transfer: Min guard, Regular Toilet, Grab bars, Surveyor, mining Details (indicate cue type and reason): able to perform toilet transfer slowly and with use of grab bar Toileting- Clothing Manipulation and Hygiene: Total assistance, Bed level Functional mobility during ADLs: Minimal assistance, Rolling walker General ADL Comments: performed pseudo donning pant task. Required min guard for pulling pants up. Patient unsteady with hands off of the walker    Mobility  Overal bed mobility: Needs Assistance Bed Mobility: Supine to Sit Supine to sit: Max assist General bed mobility comments: up in recilner    Transfers  Overall transfer level: Needs assistance Equipment used: Rolling walker (2 wheeled) Transfer via Lift Equipment: Stedy Transfers: Sit to/from [employer] to Stand: Min guard Stand pivot transfers: Min guard General transfer comment: Min guard with RW to ambulate to bathroom and back to recliner. Unable to rise from chair without upper extremity assistance x 2 attempts.    Ambulation / Gait / Stairs / Wheelchair Mobility  Ambulation/Gait Ambulation/Gait assistance: +2 safety/equipment, Mod assist, Min assist Gait Distance (Feet): 55 Feet Assistive device: Rolling walker (2 wheeled) Gait Pattern/deviations: Step-to pattern, Decreased stride length, Shuffle, Trunk flexed General Gait  Details: no gait, executed berg balance test Gait velocity: decreased    Posture / Balance Dynamic Sitting Balance Sitting balance - Comments: able to bend down and reach feet Balance Overall balance assessment: Needs assistance Sitting-balance support: No upper extremity supported, Feet supported Sitting balance-Leahy Scale: Good Sitting balance - Comments: able to bend down and reach feet Postural control: Other (comment) (anterior lean) Standing balance support: During functional activity Standing balance-Leahy Scale: Fair Standing balance comment: Unsteady when hands off of the walker. Able to release wealker briefly for ADL task. Standardized Balance Assessment Standardized Balance Assessment : Berg Balance Test Berg Balance Test Sit to Stand: Needs minimal aid to stand or to stabilize Standing Unsupported: Able to stand 2 minutes with supervision Sitting with Back Unsupported but Feet Supported on Floor or Stool: Able to sit safely and securely 2 minutes Stand to Sit: Uses backs of legs against chair to control descent Transfers: Able to transfer safely, definite need of hands Standing Unsupported with Eyes Closed: Able to stand 10 seconds with supervision Standing Ubsupported with Feet Together: Needs help to attain position  and unable to hold for 15 seconds From Standing, Reach Forward with Outstretched Arm: Reaches forward but needs supervision From Standing Position, Pick up Object from Floor: Unable to pick up shoe, but reaches 2-5 cm (1-2") from shoe and balances independently From Standing Position, Turn to Look Behind Over each Shoulder: Needs supervision when turning Turn 360 Degrees: Needs close supervision or verbal cueing Standing Unsupported, Alternately Place Feet on Step/Stool: Needs assistance to keep from falling or unable to try Standing Unsupported, One Foot in Front: Loses balance while stepping or standing Standing on One Leg: Unable to try or needs assist to  prevent fall Total Score: 21    Special needs/care consideration Indwelling catheter placed by Urology 01/13/2021 Dr Junious Silk non- latex Straight-tip 20 Fr   Previous Home Environment  Living Arrangements: Spouse/significant other Available Help at Discharge: Family, Available 24 hours/day Type of Home: [place]: One level Home Access: Stairs to enter Entrance Stairs-Rails: None Entrance Stairs-Number of Steps: 2 Bathroom Shower/Tub: Multimedia programmer: Standard Bathroom Accessibility: Yes How Accessible: Accessible via walker [place]: No Additional Comments: home verified by wife  Discharge Living Setting Plans for Discharge Living Setting: Patient's home, Lives with (comment) (wife) Type of Home at Discharge: House Discharge Home Layout: One level Discharge Home Access: Stairs to enter Entrance Stairs-Rails: None Entrance Stairs-Number of Steps: 2 Discharge Bathroom Shower/Tub: Walk-in shower Discharge Bathroom Toilet: Standard Discharge Bathroom Accessibility: Yes How Accessible: Accessible via walker Does the patient have any problems obtaining your medications?: No  Social/Family/Support Systems Patient Roles: Spouse, Parent Contact Information: wife, [name] Anticipated Caregiver: wife Anticipated Ambulance person Information: see above Ability/Limitations of Caregiver: no limitations Caregiver Availability: 24/7 Discharge Plan Discussed with Primary Caregiver: Yes Is Caregiver In Agreement with Plan?: Yes Does Caregiver/Family have Issues with Lodging/Transportation while Pt is in Rehab?: No  Goals Patient/Family Goal for Rehab: Mod I to supervision with PT and OT Expected length of stay: ELOS 10 to 14 days Pt/Family Agrees to Admission and willing to participate: Yes Program Orientation Provided & Reviewed with Pt/Caregiver Including Roles  & Responsibilities: Yes  Decrease burden of Care through IP rehab admission:  n/a  Possible need for SNF placement upon discharge: not antiicpated  Patient Condition: I have reviewed medical records from [facility] , spoken with CSW, and patient, spouse, and daughter. I met with patient at the bedside for inpatient rehabilitation assessment.  Patient will benefit from ongoing PT, OT, and SLP, can actively participate in 3 hours of therapy a day 5 days of the week, and can make measurable gains during the admission.  Patient will also benefit from the coordinated team approach during an Inpatient Acute Rehabilitation admission.  The patient will receive intensive therapy as well as Rehabilitation physician, nursing, social worker, and care management interventions.  Due to bladder management, bowel management, safety, skin/wound care, disease management, medication administration, pain management, and patient education the patient requires 24 hour a day rehabilitation nursing.  The patient is currently min assist overall with mobility and basic ADLs.  Discharge setting and therapy post discharge at home with outpatient is anticipated.  Patient has agreed to participate in the Acute Inpatient Rehabilitation Program and will admit today.  Preadmission Screen Completed By: Danne Baxter RN MSN with updates by Michel Santee, 01/30/2021 2:19 PM ______________________________________________________________________   Discussed status with Dr. Naaman Plummer on 01/30/21  at 2:19 PM  and received approval for admission today.  Admission Coordinator: Danne Baxter RN MSN with  updates by Michel Santee, PT, time 2:19 PM Sudie Grumbling 01/30/21    Assessment/Plan: Diagnosis: debility Does the need for close, 24 hr/day Medical supervision in concert with the patient's rehab needs make it unreasonable for this patient to be served in a less intensive setting? Yes Co-Morbidities requiring supervision/potential complications: dm, bph, sepsis Due to bladder management, bowel management,  safety, skin/wound care, disease management, medication administration, pain management, and patient education, does the patient require 24 hr/day rehab nursing? Yes Does the patient require coordinated care of a physician, rehab nurse, PT, OT to address physical and functional deficits in the context of the above medical diagnosis(es)? Yes Addressing deficits in the following areas: balance, endurance, locomotion, strength, transferring, bowel/bladder control, bathing, dressing, feeding, grooming, toileting, and psychosocial support Can the patient actively participate in an intensive therapy program of at least 3 hrs of therapy 5 days a week? Yes The potential for patient to make measurable gains while on inpatient rehab is excellent Anticipated functional outcomes upon discharge from inpatient rehab: modified independent and supervision PT, modified independent and supervision OT, n/a SLP Estimated rehab length of stay to reach the above functional goals is: 10-14 days Anticipated discharge destination: Home 10. Overall Rehab/Functional Prognosis: excellent   MD Signature: Meredith Staggers, MD, [place] [facility] 01/30/2021

## 2021-01-28 NOTE — Progress Notes (Signed)
Inpatient Rehabilitation Admissions Coordinator   I have received an initial denial for Cir admit from [facility]. I have contacted patient's  wife at bedside by phone and she is aware. I contacted Dr. Thereasa Solo and have arranged a peer to peer call with Bernadene Person MD and Dr. Thereasa Solo for further discussion.  Danne Baxter, RN, MSN Rehab Admissions Coordinator [phone] 01/28/2021 1:18 PM

## 2021-01-28 NOTE — Progress Notes (Addendum)
Joseph Banks  BOF:[id number] DOB: Aug 03, 1945 DOA: 01/12/2021 PCP: Glenda Chroman, MD    Brief Narrative:  75yo with a history of DM who had recently been diagnosed with prostatitis treated with ceftriaxone and bactrim but continued to experience worsening perianal pain with fever as high as 104. He was therefore changed to ciprofloxacin but symptoms persisted. CT at time of repeat presentation suggested a persistent bladder mass and enlarging prostate.  Significant Events:  Since admission the patient has been treated with antibiotic under the direction of the ID service.  His stay has been complicated by the development of new atrial fibrillation with RVR requiring medication titration.  He remained encephalopathic for an extended period of time before slowly regaining his cognition. 6/16 seen by Urology - UTI sx - rocephin IM + bactrim po 6/30 [facility] ED worsening pain/fever - CT abdom - cipro po 7/4 admit via [facility] ED > [facility] - persisting fevers/pain 7/4 transfer to ICU due to sepsis - encephalopathy 7/7 PICC line placement 7/8 LP under conscious sedation 7/13 MRI brain - no acute findings  7/17 PICC removed   Outpatient Follow-Up Issues: -will need Urology follow-up for foley cath removal if unable to be removed while on CIR  -monitor for recurrent/paroxysmal Afib   Consultants:  Infectious Disease Urology PCCM IR  Code Status: FULL CODE  Antimicrobials:  Vanc 7/4 >> 7/7 Meropenem 7/4 >> 7/7 Rocehin 7/7 >> 7/9 Ampicillin 7/7 >> 7/9 Doxy 7/7 > 7/13 Acyclovir 7/9 > 7/13  DVT prophylaxis: SCDs  Subjective: Did not sleep well last night despite 5 mg of Ambien.  Feels that his cognitive status slowly improving but still not at baseline.  Has intermittent difficulty with word finding.  Denies chest pain shortness of breath nausea vomiting abdominal pain.  Is highly motivated to work with rehab.  The patient has been waiting on insurance authorization for 3-4 days to allow him  to be discharged to inpatient rehab.  He has been medically ready for discharge to a supportive rehab environment with physician oversight this entire time.  Just today we were informed by his insurance company that they were denying our recommendation for inpatient rehab.  An appeal has been initiated.  Assessment & Plan:  Acute prostatitis with severe sepsis POA Sepsis resolved - no prostatic abscess noted on MRI - Foley placed by Urology on admission - has now defervesced - has completed a course of antibiotic therapy - needs voiding trial when more ambulatory (this will need to be overseen closely ask risk for retention high and to miss this could lead to a repeated rapid decline)  Viral encephalitis - aseptic meningitis - toxic metabolic encephalopathy Status post LP 7/8 - HSV and Enterovirus PCR negative therefore acyclovir stopped - meningitis and encephalitis panel negative - symptoms included AMS, photophobia, nuchal rigidity, agitation - very slowly improved initially, but then rapidly improved - is now conversant and interactive, but resports he feels his "thinking is still a bit off" at times - some mild persisting delirium evident at times - MRI brain 7/13 without acute findings - B12, folate, and ammonia normal - hopeful that directed careful rehab will aid in full recovery of his cognitive abilities   Back pain Felt to be due to prolonged bedrest - improving w/ increased motion - should improve further w/ ongoing rehab - attempt to avoid narcotics to avoid clouding his sensorium   Dysphagia With improved mental status is now safe for regular diet - resolved  BPH Continue Foley per Urology - cont finasteride and flomax - with improving mobility expect foley can be discontinued for voiding trial once patient arrives on CIR - this will need to be closely supervised as unnoticed / prolonged obstruction/retention could have devastating consequences and lead to another prolonged hospital  stay   Newly diagnosed transient atrial fibrillation with RVR Precipitated by sepsis -TSH normal - magnesium normal - NSR throughout second portion of hospital stay -given onset during severe acute illness will not commit patient to lifelong anticoagulation for now - this will need to be monitored in outpatient follow-up, as well as during strenuous exercise while on CIR   Acute kidney injury Felt to be related to bladder outlet obstruction which is now resolved with Foley - renal function has normalized  Thrombocytopenia Resolved - due to sepsis  Transaminitis Likely shock liver - LFTs have normalized  DM2 CBG well controlled  Insomnia  Stop trazadone which is not helping -increase Ambien to 10 mg tonight   Family Communication: Spoke with wife at bedside at length Status is: Inpatient  Remains inpatient appropriate because:Unsafe d/c plan  Dispo: The patient is from: Home              Anticipated d/c is to: CIR              Patient currently is medically stable to d/c.   Difficult to place patient No   Objective: Blood pressure 125/79, pulse 87, temperature (!) 97.5 F (36.4 C), temperature source Oral, resp. rate 18, height 6\' 3"  (1.905 m), weight 97.4 kg, SpO2 99 %.  Intake/Output Summary (Last 24 hours) at 01/28/2021 0740 Last data filed at 01/28/2021 9211 Gross per 24 hour  Intake 1680 ml  Output 1850 ml  Net -170 ml    Filed Weights   01/19/21 0652 01/20/21 0621 01/21/21 0500  Weight: 104.4 kg 101.7 kg 97.4 kg    Examination: General: No acute respiratory distress Lungs: Clear to auscultation bilaterally without wheezes or crackles Cardiovascular: Regular rate and rhythm without murmur g Abdomen: Nontender, nondistended, soft, bowel sounds positive, no rebound, no ascites, no appreciable mass - foley in place draining yellow transparent urine  Extremities: No significant cyanosis, clubbing, or edema bilateral lower extremities   CBC: Recent Labs  Lab  01/22/21 0202 01/25/21 0315  WBC 7.0 5.8  HGB 10.1* 11.1*  HCT 29.8* 33.6*  MCV 93.4 94.4  PLT 366 941    Basic Metabolic Panel: Recent Labs  Lab 01/22/21 0202 01/25/21 0315  NA 136 137  K 4.2 4.1  CL 104 106  CO2 26 23  GLUCOSE 148* 131*  BUN 17 22  CREATININE 0.59* 0.83  CALCIUM 9.3 9.4  MG 2.3 2.3  PHOS 3.3  --     GFR: Estimated Creatinine Clearance: 93.3 mL/min (by C-G formula based on SCr of 0.83 mg/dL).  Liver Function Tests: Recent Labs  Lab 01/22/21 0202 01/25/21 0315  AST 30 22  ALT 36 28  ALKPHOS 41 45  BILITOT 1.0 0.9  PROT 7.2 7.7  ALBUMIN 4.2 3.9     CBG: Recent Labs  Lab 01/26/21 2147 01/27/21 0731 01/27/21 1143 01/27/21 1706 01/27/21 2116  GLUCAP 140* 128* 147* 153* 138*     No results found for this or any previous visit (from the past 240 hour(s)).     Scheduled Meds:  Chlorhexidine Gluconate Cloth  6 each Topical Daily   feeding supplement (GLUCERNA SHAKE)  237 mL Oral TID BM  finasteride  5 mg Oral Daily   gabapentin  400 mg Oral BID   lidocaine  1 patch Transdermal Q24H   metFORMIN  500 mg Oral BID WC   tamsulosin  0.4 mg Oral Daily   zolpidem  5 mg Oral QHS      LOS: 16 days   Cherene Altes, MD [facility] Office  [phone] Pager - Text Page per Shea Evans  If 7PM-7AM, please contact night-coverage per Amion 01/28/2021, 7:40 AM

## 2021-01-28 NOTE — Progress Notes (Addendum)
Physical Therapy Treatment Patient Details Name: Joseph Banks MRN: [id number] DOB: 1945/10/21 Today's Date: 01/28/2021    History of Present Illness 75yo with a history of DM who had recently been diagnosed with prostatitis treated with ceftriaxone and bactrim but continued to experience worsening perianal pain with fever as high as 104. He was therefore changed to ciprofloxacin but symptoms persisted. CT at time of repeat presentation suggested a persistent bladder mass and enlarging prostate.     Significant Events:  Since admission the patient has been treated with antibiotic under the direction of the ID service.  His stay has been complicated by the development of new atrial fibrillation with RVR requiring medication titration.  He remained encephalopathic for an extended period of time before slowly regaining his cognition.  6/16 seen by Urology - UTI sx - rocephin IM + bactrim po  6/30 [facility] ED worsening pain/fever - CT abdom - cipro po  7/4 admit via [facility] - persisting fevers/pain  7/4 transfer to ICU due to sepsis - encephalopathy  7/7 PICC line placement  7/8 LP under conscious sedation  7/13 MRI brain - no acute findings  7/17 PICC removed    PT Comments    Pt session devoted to [facility] Test. Pt score 21/56. HIGH FALL RISK  Pt required 4 sitting rest breaks throughout test to complete. Pt had total loss of balance during attempt o tandem stance, attempt at single leg stance, and attempt at feet together stance.  Pt was able to alternate step tap stool but was VERY unsteady and had near loss of balance. Pt needed close supervision when turning to look over shoulder and turning 360 degrees. Pt unable to ambulate without AD. Pt has large deficits with standing balance.   Pt needs aggressive Rehab at CIR setting to address not only his balance deficits but also to regain his prior level of Indep mobility.    Follow Up Recommendations  CIR     Equipment Recommendations        Recommendations for Other Services       Precautions / Restrictions Precautions Precautions: Fall Restrictions Weight Bearing Restrictions: No    Mobility  Bed Mobility               General bed mobility comments: sitting on EOB    Transfers Overall transfer level: Needs assistance Equipment used: None Transfers: Sit to/from Stand;Stand Pivot Transfers Sit to Stand: Min guard Stand pivot transfers: Min guard;+2 safety/equipment       General transfer comment: transfers during berg balance test  Ambulation/Gait             General Gait Details: no gait, executed berg balance test   Stairs             Wheelchair Mobility    Modified Rankin (Stroke Patients Only)       Balance Overall balance assessment: Needs assistance Sitting-balance support: Feet supported Sitting balance-Leahy Scale: Good                           Standardized Balance Assessment Standardized Balance Assessment : Berg Balance Test Berg Balance Test Sit to Stand: Needs minimal aid to stand or to stabilize Standing Unsupported: Able to stand 2 minutes with supervision Sitting with Back Unsupported but Feet Supported on Floor or Stool: Able to sit safely and securely 2 minutes Stand to Sit: Uses backs of legs against chair to control  descent Transfers: Able to transfer safely, definite need of hands Standing Unsupported with Eyes Closed: Able to stand 10 seconds with supervision Standing Ubsupported with Feet Together: Needs help to attain position and unable to hold for 15 seconds From Standing, Reach Forward with Outstretched Arm: Reaches forward but needs supervision From Standing Position, Pick up Object from Floor: Unable to pick up shoe, but reaches 2-5 cm (1-2") from shoe and balances independently From Standing Position, Turn to Look Behind Over each Shoulder: Needs supervision when turning Turn 360 Degrees: Needs close supervision or verbal  cueing Standing Unsupported, Alternately Place Feet on Step/Stool: Needs assistance to keep from falling or unable to try Standing Unsupported, One Foot in Front: Loses balance while stepping or standing Standing on One Leg: Unable to try or needs assist to prevent fall Total Score: 21/56 Indicating 100% FALL RISK          Cognition Arousal/Alertness: Awake/alert Behavior During Therapy: WFL for tasks assessed/performed Overall Cognitive Status: Within Functional Limits for tasks assessed                                        Exercises      General Comments General comments (skin integrity, edema, etc.): berg balance 21/56      Pertinent Vitals/Pain Pain Assessment: No/denies pain    Home Living                      Prior Function            PT Goals (current goals can now be found in the care plan section) Acute Rehab PT Goals Patient Stated Goal: go to CIR PT Goal Formulation: With patient/family Time For Goal Achievement: 02/03/21 Potential to Achieve Goals: Fair    Frequency    Min 2X/week      PT Plan Current plan remains appropriate    Co-evaluation              AM-PAC PT "6 Clicks" Mobility   Outcome Measure  Help needed turning from your back to your side while in a flat bed without using bedrails?: A Lot Help needed moving from lying on your back to sitting on the side of a flat bed without using bedrails?: A Lot Help needed moving to and from a bed to a chair (including a wheelchair)?: Total Help needed standing up from a chair using your arms (e.g., wheelchair or bedside chair)?: Total Help needed to walk in hospital room?: Total Help needed climbing 3-5 steps with a railing? : Total 6 Click Score: 8    End of Session Equipment Utilized During Treatment: Gait belt Activity Tolerance: Patient limited by fatigue Patient left: Other (comment);with family/visitor present (seated at EOB) Nurse Communication:  Mobility status PT Visit Diagnosis: Unsteadiness on feet (R26.81);Difficulty in walking, not elsewhere classified (R26.2);Other abnormalities of gait and mobility (R26.89)     Time:  - 15:08 - 15:33      Charges:    2 ta                    Ernst Spell, PTA Student  Acute Rehabilitation Services Pager : [phone] Office : [phone]    I agree with the following treatment note.  This session was performed under the supervision of a licensed clinician  Rica Koyanagi  PTA Acute  Rehabilitation Services  Pager      [phone] Office      [phone]

## 2021-01-29 LAB — GLUCOSE, CAPILLARY
Glucose-Capillary: 137 mg/dL — ABNORMAL HIGH (ref 70–99)
Glucose-Capillary: 156 mg/dL — ABNORMAL HIGH (ref 70–99)
Glucose-Capillary: 140 mg/dL — ABNORMAL HIGH (ref 70–99)

## 2021-01-29 NOTE — Progress Notes (Signed)
Subjective: Patient reports feeling better.  He still has a Foley catheter in place that is nonbothersome at this point.  His wife is at bedside.  Objective: Vital signs in last 24 hours: Temp:  [97.6 F (36.4 C)-98 F (36.7 C)] 98 F (36.7 C) (07/21 0546) Pulse Rate:  [80-92] 92 (07/21 0546) Resp:  [18] 18 (07/21 0546) BP: (126-143)/(69-89) 143/77 (07/21 0546) SpO2:  [99 %] 99 % (07/21 0546)  Intake/Output from previous day: 07/20 0701 - 07/21 0700 In: 1560 [P.O.:1560] Out: 2475 [Urine:2475] Intake/Output this shift: Total I/O In: 240 [P.O.:240] Out: 150 [Urine:150]  Physical Exam:  Constitutional: Vital signs reviewed. WD WN in NAD   Eyes: PERRL, No scleral icterus.   Cardiovascular: RRR Pulmonary/Chest: Normal effortt.  Genitourinary: Extremities: No cyanosis or edema   Lab Results: No results for input(s): HGB, HCT in the last 72 hours. BMET No results for input(s): NA, K, CL, CO2, GLUCOSE, BUN, CREATININE, CALCIUM in the last 72 hours. No results for input(s): LABPT, INR in the last 72 hours. No results for input(s): LABURIN in the last 72 hours. Results for orders placed or performed during the hospital encounter of 01/12/21  Blood culture (routine single)     Status: None   Collection Time: 01/12/21 12:57 PM   Specimen: Right Antecubital; Blood  Result Value Ref Range Status   Specimen Description   Final    RIGHT ANTECUBITAL BOTTLES DRAWN AEROBIC AND ANAEROBIC   Special Requests Blood Culture adequate volume  Final   Culture   Final    NO GROWTH 5 DAYS Performed at [facility], [address] [place], [place] [address]    Report Status 01/17/2021 FINAL  Final  Urine culture     Status: Abnormal   Collection Time: 01/12/21  1:08 PM   Specimen: In/Out Cath Urine  Result Value Ref Range Status   Specimen Description   Final    IN/OUT CATH URINE Performed at [facility], [address] [place], [place] [address]    Special Requests   Final     NONE Performed at [facility], [address] [place], [place] [address]    Culture 1,000 COLONIES/mL STAPHYLOCOCCUS EPIDERMIDIS (A)  Final   Report Status 01/15/2021 FINAL  Final   Organism ID, Bacteria STAPHYLOCOCCUS EPIDERMIDIS (A)  Final      Susceptibility   Staphylococcus epidermidis - MIC*    CIPROFLOXACIN >=8 RESISTANT Resistant     GENTAMICIN <=0.5 SENSITIVE Sensitive     NITROFURANTOIN <=16 SENSITIVE Sensitive     OXACILLIN <=0.25 SENSITIVE Sensitive     TETRACYCLINE 2 SENSITIVE Sensitive     VANCOMYCIN 2 SENSITIVE Sensitive     TRIMETH/SULFA >=320 RESISTANT Resistant     CLINDAMYCIN <=0.25 SENSITIVE Sensitive     RIFAMPIN <=0.5 SENSITIVE Sensitive     Inducible Clindamycin NEGATIVE Sensitive     * 1,000 COLONIES/mL STAPHYLOCOCCUS EPIDERMIDIS  Resp Panel by RT-PCR (Flu A&B, Covid) Nasopharyngeal Swab     Status: None   Collection Time: 01/12/21  1:09 PM   Specimen: Nasopharyngeal Swab; Nasopharyngeal(NP) swabs in vial transport medium  Result Value Ref Range Status   SARS Coronavirus 2 by RT PCR NEGATIVE NEGATIVE Final    Comment: (NOTE) SARS-CoV-2 target nucleic acids are NOT DETECTED.  The SARS-CoV-2 RNA is generally detectable in upper respiratory specimens during the acute phase of infection. The lowest concentration of SARS-CoV-2 viral copies this assay can detect is 138 copies/mL. A negative result does not preclude SARS-Cov-2 infection  and should not be used as the sole basis for treatment or other patient management decisions. A negative result may occur with  improper specimen collection/handling, submission of specimen other than nasopharyngeal swab, presence of viral mutation(s) within the areas targeted by this assay, and inadequate number of viral copies(<138 copies/mL). A negative result must be combined with clinical observations, patient history, and epidemiological information. The expected result is Negative.  Fact Sheet for Patients:   EntrepreneurPulse.com.au  Fact Sheet for Healthcare Providers:  IncredibleEmployment.be  This test is no t yet approved or cleared by the [place] FDA and  has been authorized for detection and/or diagnosis of SARS-CoV-2 by FDA under an Emergency Use Authorization (EUA). This EUA will remain  in effect (meaning this test can be used) for the duration of the COVID-19 declaration under Section 564(b)(1) of the Act, 21 U.S.C.section 360bbb-3(b)(1), unless the authorization is terminated  or revoked sooner.       Influenza A by PCR NEGATIVE NEGATIVE Final   Influenza B by PCR NEGATIVE NEGATIVE Final    Comment: (NOTE) The Xpert Xpress SARS-CoV-2/FLU/RSV plus assay is intended as an aid in the diagnosis of influenza from Nasopharyngeal swab specimens and should not be used as a sole basis for treatment. Nasal washings and aspirates are unacceptable for Xpert Xpress SARS-CoV-2/FLU/RSV testing.  Fact Sheet for Patients: EntrepreneurPulse.com.au  Fact Sheet for Healthcare Providers: IncredibleEmployment.be  This test is not yet approved or cleared by the [place] FDA and has been authorized for detection and/or diagnosis of SARS-CoV-2 by FDA under an Emergency Use Authorization (EUA). This EUA will remain in effect (meaning this test can be used) for the duration of the COVID-19 declaration under Section 564(b)(1) of the Act, 21 U.S.C. section 360bbb-3(b)(1), unless the authorization is terminated or revoked.  Performed at [facility], [address], [place], [place] [address]   MRSA Next Gen by PCR, Nasal     Status: None   Collection Time: 01/12/21  7:30 PM  Result Value Ref Range Status   MRSA by PCR Next Gen NOT DETECTED NOT DETECTED Final    Comment: (NOTE) The GeneXpert MRSA Assay (FDA approved for NASAL specimens only), is one component of a comprehensive MRSA colonization  surveillance program. It is not intended to diagnose MRSA infection nor to guide or monitor treatment for MRSA infections. Test performance is not FDA approved in patients less than 26 years old. Performed at [facility], [place] [address] [place], [place] [address]   Culture, blood (single)     Status: None   Collection Time: 01/14/21  8:50 AM   Specimen: BLOOD LEFT HAND  Result Value Ref Range Status   Specimen Description   Final    BLOOD LEFT HAND Performed at [place] [address], [place], [place] [address]    Special Requests   Final    BOTTLES DRAWN AEROBIC ONLY Blood Culture adequate volume Performed at [place] [address] [place], [place] [address]    Culture   Final    NO GROWTH 5 DAYS Performed at [facility], [place] [address], [place], [place] [address]    Report Status 01/19/2021 FINAL  Final  CSF culture w Gram Stain     Status: None   Collection Time: 01/16/21  4:30 PM   Specimen: Cerebrospinal Fluid  Result Value Ref Range Status   Specimen Description   Final    LUMBAR Performed at [facility], [place] [address], [place], [place] [address]    Special Requests   Final    NONE Performed at [facility], [place] [address], [place], [place] [address]    Gram Stain   Final    WBC PRESENT, PREDOMINANTLY MONONUCLEAR NO ORGANISMS SEEN CYTOSPIN SMEAR Gram Stain Report Called to,Read Back By and Verified With: S.WEAVER, RN AT 2153 ON 07.08.22 BY N.[email or web address] Performed at [place] [address] [place], [place] [address]    Culture   Final    NO GROWTH 3 DAYS Performed at [facility], [place] [address] [place], [place] [address]    Report Status 01/20/2021 FINAL  Final  Culture, fungus without smear     Status: None (Preliminary result)   Collection Time: 01/16/21  4:30 PM   Specimen: Cerebrospinal Fluid  Result Value Ref  Range Status   Specimen Description   Final    LUMBAR Performed at [place] [address] [place], [place] [address]    Special Requests   Final    NONE Performed at [facility], [place] [address] [place], [place] [address]    Culture   Final    NO FUNGUS ISOLATED AFER 12 DAYS Performed at [facility], [place] [address] [place], [place] [address]    Report Status PENDING  Incomplete  Anaerobic culture w Gram Stain     Status: None   Collection Time: 01/16/21  4:30 PM   Specimen: Cerebrospinal Fluid  Result Value Ref Range Status   Specimen Description   Final    LUMBAR Performed at [place] [address], [place], [place] [address]    Special Requests   Final    NONE Performed at [facility], [place] [address], [place], [place] [address]    Gram Stain   Final    WBC PRESENT, PREDOMINANTLY MONONUCLEAR NO ORGANISMS SEEN CYTOSPIN SMEAR    Culture   Final    NO ANAEROBES ISOLATED Performed at [facility], [place] [address] [place], [place] [address]    Report Status 01/22/2021 FINAL  Final  HSV 1/2 Ab IgG/IgM CSF     Status: None   Collection Time: 01/16/21  4:30 PM   Specimen: Cerebrospinal Fluid  Result Value Ref Range Status   HSV 1/2 Ab, IgM, CSF 0.44 <=0.89 IV Final    Comment: (NOTE) INTERPRETIVE INFORMATION: Herpes Simplex Virus                          Type 1 and/or 2 Antibodies,                          IgM by ELISA, CSF  0.89 IV or Less .......... Negative: No significant                             level of detectable HSV IgM                             antibody.  0.90 - 1.09 IV ........... Equivocal: Questionable                             presence of IgM antibodies.  Repeat testing in 10-14 days                             may be helpful.  1.10 IV or Greater ....... Positive: IgM antibody to HSV                             detected,  which may indicate a                             current or recent infection.                             However, low levels of IgM                             antibodies may occasionally                             persist for more than 12                             months post-infection. The detection of antibodies to herpes simplex virus in CSF may indicate central nervou s system infection. However, consideration must be given to possible contamination by blood or transfer of serum antibodies across the blood-brain barrier. Fourfold or greater rise in CSF antibodies to herpes on specimens at least 4 weeks apart are found in 74-94 % of patients with herpes encephalitis. Specificity of the test based on a single CSF testing is not established. Presently PCR is the primary means of establishing a diagnosis of herpes encephalitis. This test was developed and its performance characteristics determined by [employer]. It has not been cleared or approved by the [place] Food and Drug Administration. This test was performed in a CLIA certified laboratory and is intended for clinical purposes.    HSV 1/2 Ab Screen IgG, CSF 0.39 <=0.89 IV Final    Comment: (NOTE) INTERPRETIVE INFORMATION: Herpes Simplex Virus Type 1 and/or 2                    Antibodies, IgG CSF  0.89 IV or Less .......... Negative: No significant                             level of detectable HSV IgG                             antibody.  0.90 - 1.09 IV ........... Equivocal: Questionable                             presence of IgG antibodies.                             Repeat testing in 10-14 days                             may be helpful.  1.10 IV or Greater ....... Positive:  IgG antibody to HSV                             detected, which may indicate                             a current or past HSV                             infection. The detection of antibodies to herpes simplex virus in CSF may indicate  central nervous system infection. However, consideration must be given to possible contamination by blood or transfer of serum antibodies across the blood-brain barrier. Fourfold or greater rise in CSF antibodies to herpes on specimens at l east 4 weeks apart are found in 74-94 % of patients with herpes encephalitis. Specificity of the test based on a single CSF testing is not established. Presently PCR is the primary means of establishing a diagnosis of herpes encephalitis. This test was developed and its performance characteristics determined by [employer]. It has not been cleared or approved by the [place] Food and Drug Administration. This test was performed in a CLIA certified laboratory and is intended for clinical purposes. Performed At: [facility] [address] [place], [place] [id number] Hilton Sinclair I MD GS:[id number]     Studies/Results: No results found.  Assessment/Plan:  1.  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  BPH with obstruction, currently treated with Foley catheter.  3.  Once he goes to rehab, I think it is fine to give him a voiding trial.  For now, continue dual medical therapy of finasteride and tamsulosin.  We will arrange for him to have follow-up after rehab in our [place] office.   LOS: 17 days   Joseph Banks 01/29/2021, 11:23 AM

## 2021-01-29 NOTE — Plan of Care (Signed)

## 2021-01-29 NOTE — Care Management Important Message (Signed)
Important Message  Patient Details IM Letter given to the Patient. Name: Joseph Banks MRN: [id number] Date of Birth: 02-Apr-1946   Medicare Important Message Given:  Yes     Kerin Salen 01/29/2021, 1:38 PM

## 2021-01-29 NOTE — Evaluation (Signed)
Speech Language Pathology Evaluation Patient Details Name: Joseph Banks MRN: [id number] DOB: 11/13/1945 Today's Date: 01/29/2021 Time: 7939-0300 SLP Time Calculation (min) (ACUTE ONLY): 32 min  Problem List:  Patient Active Problem List   Diagnosis Date Noted   Back pain 01/20/2021   Viral encephalitis 01/17/2021   Atrial fibrillation with RVR ([place]) 01/15/2021   BPH (benign prostatic hyperplasia)    Dysphagia    Acute lower UTI 01/12/2021   Diabetes mellitus type 2 in nonobese ([place]) 01/12/2021   Elevated LFTs 01/12/2021   AKI (acute kidney injury) ([place]) 01/12/2021   GAD (generalized anxiety disorder) 07/14/2018   Bipolar disorder ([place]) 07/14/2018   Past Medical History:  Past Medical History:  Diagnosis Date   BPH (benign prostatic hyperplasia)    Diabetes mellitus without complication ([place])    Past Surgical History:  Past Surgical History:  Procedure Laterality Date   IR FLUORO GUIDED NEEDLE PLC ASPIRATION/INJECTION LOC  01/16/2021   TRANSURETHRAL RESECTION OF PROSTATE     HPI:  75 yo male presented to emergency department with urinary hesitancy, dysuria on 01/12/2021.  He was treated for prostatitis with oral antibiotics.  Pt with PMH + for BPH, TURP,  DM.  Was found to have lactic acidosis, atrial fibrillation and concern for sepsis.  Progressive confusion noted with pt requiring small bore feeding tube.   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  Also has h/o COVID per RN, DM and BPH.  He underwent brain MRI approximately one week ago which was normal. Pt continues with clinical indications of cognitive changes with this event and MD ordered SLP for cognitive evaluation.  Pt known to this SLP from seeing him for dysphagia management.   Assessment / Plan / Recommendation Clinical Impression  SLUMS ([place] Mental Status Examination) administered with pt scoring 18/30, per authors of examination, this score  is indicative of significant cognitive linguistic deficits (27-30 nomral, 21-26 mild cognitive deficit).    Given this pt has post=graduate education and is highly intelligent, this finding is a significant change of baseline cognitive skills.  Strengths include orientation, recall of 5 items, for which he used compensation strategy his father taught him as a child.    Deficits in attention to detailed information *pt is a Ambulance person* negatively impacted pt's mental math, clock drawing, manipulation of numbers, etc and will impair participation in activities he conducted prior to admission.     Pt able to verbalize purpose of testing is "to improve QOL" after he indicated his score on the SLUMS indicating "failing".  He recognized testing purpose was to find areas of focus for cognitive linguistic rehab to maximize his cognitive rehab to decrease caregiver burden.   Pt aware of his cognitive difficulties but he also tends to mitigate them by speaking of his behavioral responses (not answering MD questions, etc) impacting his cognitive performance. Given his excellent organization skills, his potential for improvement with cognitive rehab are excellent.   Recommend CIR for SLP to treat pt's cognitive linguistic deficits including consideration for incorporating ATP (Attention Process Training) to improve attention that will generalize into functional cognitive improvements. SLP reviewed findings with pt and his wife using teach back for clinical reasoning for evaluation and treatment.  Both in agreement that follow up SLP is indicated.  Thank you for this consult.    SLP Assessment  SLP Recommendation/Assessment: Patient needs continued Speech Lanaguage Pathology Services SLP Visit Diagnosis: Cognitive communication deficit (R41.841)  Follow Up Recommendations       Frequency and Duration min 1 x/week  1 week      SLP Evaluation Cognition  Overall Cognitive Status: Impaired/Different from  baseline Arousal/Alertness: Awake/alert Orientation Level: Oriented to person;Oriented to place;Oriented to time (did not test situation) Memory:  (recalled 5/5 of items without cues, used compensation strategies of visualization and chunking together that his father taught him) Awareness: Impaired Awareness Impairment: Intellectual impairment (admits to some tasks being more challenging but also appears to try to diminish this with report of behaviors, etc) Problem Solving: Impaired Problem Solving Impairment: Verbal complex;Functional complex Executive Function: Self Monitoring;Self Correcting Self Monitoring: Impaired Self Monitoring Impairment: Verbal complex Self Correcting: Impaired Self Correcting Impairment: Verbal complex Comments: Pt able to verbalize purpose of testing is "to improve QOL" after he indicated his score on the SLUMS indicating "failing".  He recognized testing purpose was to find areas of focus for cognitive linguistic rehab to maximize his cognitive rehab to decrease caregiver burden. On SLUMS testing, visuospatial skills impacted as pt needed self correction x2 and he placed numbers on the outside of the clock without self correction.  Detailed information missed by pt resulting in answering functional math question, clock drawing and narrative questions incorrectly.  With repetition of math question at end of testing - pt able to easily answer correctly.       Comprehension  Auditory Comprehension Overall Auditory Comprehension: Impaired Yes/No Questions: Not tested Commands: Impaired Complex Commands:  (complex directions during testing were difficult for pt to follow and with mild delayed processing contributing) Conversation: Complex Interfering Components: Attention;Working memory;Processing speed [employer]: Repetition Reading Comprehension Reading Status: Not tested    Expression Expression Primary Mode of Expression: Verbal Verbal  Expression Overall Verbal Expression: Appears within functional limits for tasks assessed Initiation: No impairment Level of Generative/Spontaneous Verbalization: Conversation Repetition:  (DNT) Naming: Not tested Pragmatics: No impairment Non-Verbal Means of Communication: Not applicable Written Expression Dominant Hand: Right   Oral / Motor      GO           Kathleen Lime, MS [facility] SLP Acute Rehab Services Office [phone] Pager [phone]         Macario Golds 01/29/2021, 11:08 AM

## 2021-01-29 NOTE — Progress Notes (Signed)
Joseph Banks  [id number] DOB: [birth year] DOA: 01/12/2021 PCP: Glenda Chroman, MD    Brief Narrative:  75yo with a history of DM who had recently been diagnosed with prostatitis treated with ceftriaxone and bactrim but continued to experience worsening perianal pain with fever as high as 104. He was therefore changed to ciprofloxacin but symptoms persisted. CT at time of repeat presentation suggested a persistent bladder mass and enlarging prostate.  Significant Events:  Since admission the patient has been treated with antibiotic under the direction of the ID service.  His stay has been complicated by the development of new atrial fibrillation with RVR requiring medication titration.  He remained encephalopathic for an extended period of time before slowly regaining his cognition. 6/16 seen by Urology - UTI sx - rocephin IM + bactrim po 6/30 [facility] ED worsening pain/fever - CT abdom - cipro po 7/4 admit via [facility] ED > [facility] - persisting fevers/pain 7/4 transfer to ICU due to sepsis - encephalopathy 7/7 PICC line placement 7/8 LP under conscious sedation 7/13 MRI brain - no acute findings  7/17 PICC removed   Outpatient Follow-Up Issues: -will need Urology follow-up for foley cath removal if unable to be removed while in CIR  -monitor for recurrent/paroxysmal Afib   Consultants:  Infectious Disease Urology PCCM IR  Code Status: FULL CODE  Antimicrobials:  Vanc 7/4 >> 7/7 Meropenem 7/4 >> 7/7 Rocehin 7/7 >> 7/9 Ampicillin 7/7 >> 7/9 Doxy 7/7 > 7/13 Acyclovir 7/9 > 7/13  DVT prophylaxis: SCDs  Subjective: Patient denies any abdominal pain nausea or vomiting.  Frustrated by insurance denial of inpatient rehabilitation stay.  Advance did not get much sleep last night despite Ambien.  Otherwise he feels well.  His wife is at the bedside.  Assessment & Plan:  Acute prostatitis with severe sepsis POA Sepsis resolved - no prostatic abscess noted on MRI - Foley placed by  Urology on admission - has now defervesced - has completed a course of antibiotic therapy - needs voiding trial when more ambulatory (this will need to be overseen closely since risk for retention is high and to miss this could lead to a repeated rapid decline).  Per urology this can be done while he is at rehab.  Viral encephalitis - aseptic meningitis - toxic metabolic encephalopathy Status post LP 7/8 - HSV and Enterovirus PCR negative therefore acyclovir stopped - meningitis and encephalitis panel negative - symptoms included AMS, photophobia, nuchal rigidity, agitation. Seems to have improved though not completely back to his baseline MRI brain done on 7/13 did not show any acute findings.  B12 folate and ammonia levels were normal.  Back pain Felt to be due to prolonged bedrest - improving w/ increased motion - should improve further w/ ongoing rehab - attempt to avoid narcotics to avoid clouding his sensorium   Dysphagia With improved mental status is now safe for regular diet - resolved   BPH Continue Foley per Urology - cont finasteride and flomax - with improving mobility expect foley can be discontinued for voiding trial once patient arrives on CIR - this will need to be closely supervised as unnoticed / prolonged obstruction/retention could have devastating consequences and lead to another prolonged hospital stay   Newly diagnosed transient atrial fibrillation with RVR Precipitated by sepsis -TSH normal - magnesium normal - NSR throughout second portion of hospital stay -given onset during severe acute illness will not commit patient to lifelong anticoagulation for now - this will need to  be monitored in outpatient follow-up, as well as during strenuous exercise while on CIR   Acute kidney injury Felt to be related to bladder outlet obstruction which is now resolved with Foley - renal function has normalized  Thrombocytopenia Resolved - due to sepsis  Transaminitis Likely shock  liver - LFTs have normalized  DM2 CBG well controlled  Insomnia  Stop trazadone which is not helping -increase Ambien to 10 mg tonight   Family Communication: Spoke with wife at bedside at length Status is: Inpatient  Remains inpatient appropriate because:Unsafe d/c plan  Dispo: The patient is from: Home              Anticipated d/c is to: CIR              Patient currently is medically stable to d/c.   Difficult to place patient No   Objective: Blood pressure (!) 143/77, pulse 92, temperature 98 F (36.7 C), temperature source Oral, resp. rate 18, height 6\' 3"  (1.905 m), weight 97.4 kg, SpO2 99 %.  Intake/Output Summary (Last 24 hours) at 01/29/2021 1319 Last data filed at 01/29/2021 1000 Gross per 24 hour  Intake 1560 ml  Output 2375 ml  Net -815 ml    Filed Weights   01/19/21 0652 01/20/21 0621 01/21/21 0500  Weight: 104.4 kg 101.7 kg 97.4 kg    Examination: General appearance: Awake alert.  In no distress Resp: Clear to auscultation bilaterally.  Normal effort Cardio: S1-S2 is normal regular.  No S3-S4.  No rubs murmurs or bruit GI: Abdomen is soft.  Nontender nondistended.  Bowel sounds are present normal.  No masses organomegaly Extremities: No edema.  Neurologic:  No focal neurological deficits.     CBC: Recent Labs  Lab 01/25/21 0315  WBC 5.8  HGB 11.1*  HCT 33.6*  MCV 94.4  PLT 179    Basic Metabolic Panel: Recent Labs  Lab 01/25/21 0315  NA 137  K 4.1  CL 106  CO2 23  GLUCOSE 131*  BUN 22  CREATININE 0.83  CALCIUM 9.4  MG 2.3    GFR: Estimated Creatinine Clearance: 93.3 mL/min (by C-G formula based on SCr of 0.83 mg/dL).  Liver Function Tests: Recent Labs  Lab 01/25/21 0315  AST 22  ALT 28  ALKPHOS 45  BILITOT 0.9  PROT 7.7  ALBUMIN 3.9     CBG: Recent Labs  Lab 01/28/21 0742 01/28/21 1147 01/28/21 1630 01/28/21 2119 01/29/21 0809  GLUCAP 126* 101* 164* 142* 137*     No results found for this or any previous  visit (from the past 240 hour(s)).     Scheduled Meds:  Chlorhexidine Gluconate Cloth  6 each Topical Daily   feeding supplement (GLUCERNA SHAKE)  237 mL Oral TID BM   finasteride  5 mg Oral Daily   gabapentin  600 mg Oral BID   lidocaine  1 patch Transdermal Q24H   metFORMIN  500 mg Oral BID WC   tamsulosin  0.4 mg Oral Daily   zolpidem  5 mg Oral QHS      LOS: 17 days   [place] [facility] Office  [phone] Pager - Text Page per Shea Evans  If 7PM-7AM, please contact night-coverage per Amion 01/29/2021, 1:19 PM

## 2021-01-29 NOTE — Progress Notes (Addendum)
Inpatient Rehabilitation Admissions Coordinator   I have updates from PT, OT and SLP and will begin expedited appeal with [facility] today. I will follow up once that determination is made. Cost of care of potential Cir admit was reviewed with pt's wife yesterday.  Danne Baxter, RN, MSN Rehab Admissions Coordinator [phone] 01/29/2021 11:15 AM

## 2021-01-29 NOTE — Progress Notes (Signed)
Occupational Therapy Treatment Patient Details Name: Joseph Banks MRN: [id number] DOB: January 11, 1946 Today's Date: 01/29/2021    History of present illness 75yo with a history of DM who had recently been diagnosed with prostatitis treated with ceftriaxone and bactrim but continued to experience worsening perianal pain with fever as high as 104. He was therefore changed to ciprofloxacin but symptoms persisted. CT at time of repeat presentation suggested a persistent bladder mass and enlarging prostate.     Significant Events:  Since admission the patient has been treated with antibiotic under the direction of the ID service.  His stay has been complicated by the development of new atrial fibrillation with RVR requiring medication titration.  He remained encephalopathic for an extended period of time before slowly regaining his cognition.  6/16 seen by Urology - UTI sx - rocephin IM + bactrim po  6/30 [facility] ED worsening pain/fever - CT abdom - cipro po  7/4 admit via [facility] ED > [facility] - persisting fevers/pain  7/4 transfer to ICU due to sepsis - encephalopathy  7/7 PICC line placement  7/8 LP under conscious sedation  7/13 MRI brain - no acute findings  7/17 PICC removed   OT comments  Treatment focused on self care tasks, standing tolerance and back extension stretches to reduce back pain. Patient seated in recliner when therapist entered the room. Patient min guard to stand from recliner, pushing up with hands, and to ambulate to bathroom with RW. Patient able to stand at sink for approx 1 min and 30 seconds to brush teeth. Patient min guard to perform toilet transfer with use of grab bar. Patient returned to recliner for seated rest break. Attempts to stand without upper extremity assistance failed x 2. Patient demonstrated ability to don socks in seated position and required min guard for standing pseudo lower body dressing task. Patient unsteady with hands off the walker. Patient reports new onset of sharp  low back pain today and that he has been medicated. Therapist instructed patient on standing back extension stretches and patient performed 8 times without complaints of increased pain. Patient reports fatigue and asking to sit. Patient is making good progress towards goals but continues to exhibit poor activity tolerance compared to his baseline. Patient very active prior to hospitalization including enjoying pickle ball. Therapist continues to recommend aggressive inpatient rehab at discharge due to patient's high level of independence prior to hospitalization, high fall risk and excellent rehab potential.   Follow Up Recommendations  CIR    Equipment Recommendations  Other (comment) (TBD)    Recommendations for Other Services      Precautions / Restrictions Precautions Precautions: Fall Restrictions Weight Bearing Restrictions: No       Mobility Bed Mobility               General bed mobility comments: up in recilner    Transfers Overall transfer level: Needs assistance Equipment used: Rolling walker (2 wheeled) Transfers: Sit to/from Stand Sit to Stand: Min guard Stand pivot transfers: Min guard       General transfer comment: Min guard with RW to ambulate to bathroom and back to recliner. Unable to rise from chair without upper extremity assistance x 2 attempts.    Balance Overall balance assessment: Needs assistance Sitting-balance support: No upper extremity supported;Feet supported Sitting balance-Leahy Scale: Good Sitting balance - Comments: able to bend down and reach feet   Standing balance support: During functional activity Standing balance-Leahy Scale: Fair Standing balance comment: Unsteady when  hands off of the walker. Able to release wealker briefly for ADL task.                           ADL either performed or assessed with clinical judgement   ADL Overall ADL's : Needs assistance/impaired     Grooming: Standing;Min guard;Oral  care Grooming Details (indicate cue type and reason): patient min guard to stand at sink and perform oral care for 1 min and 30 seconds.             Lower Body Dressing: Min guard;Sit to/from stand Lower Body Dressing Details (indicate cue type and reason): Patient able to don socks from seated postion. Toilet Transfer: Min guard;Regular Toilet;Grab bars;RW Armed forces technical officer Details (indicate cue type and reason): able to perform toilet transfer slowly and with use of grab bar           General ADL Comments: performed pseudo donning pant task. Required min guard for pulling pants up. Patient unsteady with hands off of the walker     Vision Patient Visual Report: No change from baseline     Perception     Praxis      Cognition Arousal/Alertness: Awake/alert Behavior During Therapy: WFL for tasks assessed/performed Overall Cognitive Status: Within Functional Limits for tasks assessed                                          Exercises Other Exercises Other Exercises: Reports new sharp low back pain that is random with movement. performed 8 back extension reps in standing.   Shoulder Instructions       General Comments      Pertinent Vitals/ Pain       Pain Assessment: Faces Faces Pain Scale: Hurts even more Pain Location: Low back Pain Descriptors / Indicators: Sharp;Stabbing Pain Intervention(s): Premedicated before session  Home Living       Type of Home: House Home Access: Stairs to enter Technical brewer of Steps: 2 Entrance Stairs-Rails: None Home Layout: One level     Bathroom Shower/Tub: Occupational psychologist: Standard Bathroom Accessibility: Yes How Accessible: Accessible via walker     Additional Comments: home verified by wife      Prior Functioning/Environment              Frequency  Min 2X/week        Progress Toward Goals  OT Goals(current goals can now be found in the care plan section)   Progress towards OT goals: Progressing toward goals  Acute Rehab OT Goals Patient Stated Goal: improve strength and endurance OT Goal Formulation: With patient/family Time For Goal Achievement: 02/03/21 Potential to Achieve Goals: Good  Plan Discharge plan remains appropriate    Co-evaluation          OT goals addressed during session: ADL's and self-care      AM-PAC OT "6 Clicks" Daily Activity     Outcome Measure   Help from another person eating meals?: None Help from another person taking care of personal grooming?: A Little Help from another person toileting, which includes using toliet, bedpan, or urinal?: A Little Help from another person bathing (including washing, rinsing, drying)?: A Little Help from another person to put on and taking off regular upper body clothing?: A Little Help from another person to put on and taking  off regular lower body clothing?: A Little 6 Click Score: 19    End of Session Equipment Utilized During Treatment: Rolling walker;Gait belt  OT Visit Diagnosis: Unsteadiness on feet (R26.81);Other abnormalities of gait and mobility (R26.89);Muscle weakness (generalized) (M62.81);Other symptoms and signs involving cognitive function   Activity Tolerance Patient tolerated treatment well   Patient Left in chair;with call bell/phone within reach;with family/visitor present   Nurse Communication Mobility status        Time: 6681-5947 OT Time Calculation (min): 23 min  Charges: OT General Charges $OT Visit: 1 Visit OT Treatments $Self Care/Home Management : 23-37 mins  Melbert Botelho, OTR/L [place]  Office [phone] Pager: [place] 01/29/2021, 9:46 AM

## 2021-01-30 ENCOUNTER — Other Ambulatory Visit: Payer: Self-pay

## 2021-01-30 ENCOUNTER — Inpatient Hospital Stay (HOSPITAL_COMMUNITY)
Admission: RE | Admit: 2021-01-30 | Discharge: 2021-02-09 | DRG: 945 | Disposition: A | Discharge status: Home / Self Care | Payer: Medicare HMO | Source: Intra-hospital | Attending: Physical Medicine and Rehabilitation | Admitting: Physical Medicine and Rehabilitation

## 2021-01-30 ENCOUNTER — Encounter (HOSPITAL_COMMUNITY): Payer: Self-pay | Admitting: Physical Medicine and Rehabilitation

## 2021-01-30 DIAGNOSIS — E871 Hypo-osmolality and hyponatremia: Secondary | ICD-10-CM | POA: Diagnosis not present

## 2021-01-30 DIAGNOSIS — E1140 Type 2 diabetes mellitus with diabetic neuropathy, unspecified: Secondary | ICD-10-CM | POA: Diagnosis present

## 2021-01-30 DIAGNOSIS — N410 Acute prostatitis: Secondary | ICD-10-CM | POA: Diagnosis not present

## 2021-01-30 DIAGNOSIS — K7200 Acute and subacute hepatic failure without coma: Secondary | ICD-10-CM | POA: Diagnosis present

## 2021-01-30 DIAGNOSIS — Z79899 Other long term (current) drug therapy: Secondary | ICD-10-CM | POA: Diagnosis not present

## 2021-01-30 DIAGNOSIS — R5381 Other malaise: Principal | ICD-10-CM | POA: Diagnosis present

## 2021-01-30 DIAGNOSIS — T83031A Leakage of indwelling urethral catheter, initial encounter: Secondary | ICD-10-CM | POA: Diagnosis not present

## 2021-01-30 DIAGNOSIS — N390 Urinary tract infection, site not specified: Secondary | ICD-10-CM | POA: Diagnosis not present

## 2021-01-30 DIAGNOSIS — R269 Unspecified abnormalities of gait and mobility: Secondary | ICD-10-CM | POA: Diagnosis present

## 2021-01-30 DIAGNOSIS — Y846 Urinary catheterization as the cause of abnormal reaction of the patient, or of later complication, without mention of misadventure at the time of the procedure: Secondary | ICD-10-CM | POA: Diagnosis not present

## 2021-01-30 DIAGNOSIS — R6520 Severe sepsis without septic shock: Secondary | ICD-10-CM | POA: Diagnosis present

## 2021-01-30 DIAGNOSIS — Z87891 Personal history of nicotine dependence: Secondary | ICD-10-CM

## 2021-01-30 DIAGNOSIS — M25551 Pain in right hip: Secondary | ICD-10-CM | POA: Diagnosis present

## 2021-01-30 DIAGNOSIS — R059 Cough, unspecified: Secondary | ICD-10-CM | POA: Diagnosis not present

## 2021-01-30 DIAGNOSIS — A86 Unspecified viral encephalitis: Secondary | ICD-10-CM | POA: Diagnosis not present

## 2021-01-30 DIAGNOSIS — R1310 Dysphagia, unspecified: Secondary | ICD-10-CM | POA: Diagnosis present

## 2021-01-30 DIAGNOSIS — I4891 Unspecified atrial fibrillation: Secondary | ICD-10-CM | POA: Diagnosis present

## 2021-01-30 DIAGNOSIS — A419 Sepsis, unspecified organism: Secondary | ICD-10-CM | POA: Diagnosis not present

## 2021-01-30 DIAGNOSIS — K5900 Constipation, unspecified: Secondary | ICD-10-CM | POA: Diagnosis present

## 2021-01-30 DIAGNOSIS — N400 Enlarged prostate without lower urinary tract symptoms: Secondary | ICD-10-CM | POA: Diagnosis present

## 2021-01-30 DIAGNOSIS — Z7984 Long term (current) use of oral hypoglycemic drugs: Secondary | ICD-10-CM | POA: Diagnosis not present

## 2021-01-30 DIAGNOSIS — R319 Hematuria, unspecified: Secondary | ICD-10-CM | POA: Diagnosis present

## 2021-01-30 DIAGNOSIS — G4700 Insomnia, unspecified: Secondary | ICD-10-CM | POA: Diagnosis present

## 2021-01-30 DIAGNOSIS — R4189 Other symptoms and signs involving cognitive functions and awareness: Secondary | ICD-10-CM | POA: Diagnosis present

## 2021-01-30 DIAGNOSIS — M549 Dorsalgia, unspecified: Secondary | ICD-10-CM | POA: Diagnosis present

## 2021-01-30 DIAGNOSIS — M2550 Pain in unspecified joint: Secondary | ICD-10-CM | POA: Diagnosis present

## 2021-01-30 DIAGNOSIS — R339 Retention of urine, unspecified: Secondary | ICD-10-CM | POA: Diagnosis not present

## 2021-01-30 LAB — BASIC METABOLIC PANEL
Anion gap: 6 (ref 5–15)
BUN: 16 mg/dL (ref 8–23)
CO2: 22 mmol/L (ref 22–32)
Calcium: 9.2 mg/dL (ref 8.9–10.3)
Chloride: 107 mmol/L (ref 98–111)
Creatinine, Ser: 0.76 mg/dL (ref 0.61–1.24)
GFR, Estimated: >60 mL/min (ref >60)
Glucose, Bld: 119 mg/dL — ABNORMAL HIGH (ref 70–99)
Potassium: 3.8 mmol/L (ref 3.5–5.1)
Sodium: 135 mmol/L (ref 135–145)

## 2021-01-30 LAB — GLUCOSE, CAPILLARY
Glucose-Capillary: 139 mg/dL — ABNORMAL HIGH (ref 70–99)
Glucose-Capillary: 144 mg/dL — ABNORMAL HIGH (ref 70–99)

## 2021-01-30 MED ORDER — TAMSULOSIN HCL 0.4 MG PO CAPS
0.4000 mg | ORAL_CAPSULE | Freq: Every day | ORAL | Status: DC
  Administered 2021-01-31 – 2021-02-04 (×5): 0.4 mg via ORAL
  Filled 2021-01-30 (×5): qty 1

## 2021-01-30 MED ORDER — METFORMIN HCL 500 MG PO TABS
500.0000 mg | ORAL_TABLET | Freq: Two times a day (BID) | ORAL | Status: DC
  Administered 2021-01-30 – 2021-02-09 (×20): 500 mg via ORAL
  Filled 2021-01-30 (×20): qty 1

## 2021-01-30 MED ORDER — ONDANSETRON HCL 4 MG PO TABS
4.0000 mg | ORAL_TABLET | Freq: Three times a day (TID) | ORAL | Status: DC | PRN
  Filled 2021-01-30: qty 1

## 2021-01-30 MED ORDER — ZOLPIDEM TARTRATE 5 MG PO TABS
5.0000 mg | ORAL_TABLET | Freq: Every evening | ORAL | Status: DC | PRN
  Administered 2021-01-30 – 2021-02-04 (×6): 5 mg via ORAL
  Filled 2021-01-30 (×6): qty 1

## 2021-01-30 MED ORDER — LIDOCAINE 5 % EX PTCH
1.0000 | MEDICATED_PATCH | CUTANEOUS | Status: DC
  Administered 2021-01-30 – 2021-02-08 (×10): 1 via TRANSDERMAL
  Filled 2021-01-30 (×10): qty 1

## 2021-01-30 MED ORDER — FINASTERIDE 5 MG PO TABS
5.0000 mg | ORAL_TABLET | Freq: Every day | ORAL | Status: DC
  Administered 2021-01-31 – 2021-02-09 (×10): 5 mg via ORAL
  Filled 2021-01-30 (×10): qty 1

## 2021-01-30 MED ORDER — LIP MEDEX EX OINT
TOPICAL_OINTMENT | CUTANEOUS | Status: DC | PRN
  Filled 2021-01-30: qty 7

## 2021-01-30 MED ORDER — GLUCERNA SHAKE PO LIQD
237.0000 mL | Freq: Three times a day (TID) | ORAL | Status: DC
  Administered 2021-01-30 – 2021-02-09 (×11): 237 mL via ORAL

## 2021-01-30 MED ORDER — GABAPENTIN 250 MG/5ML PO SOLN
600.0000 mg | Freq: Two times a day (BID) | ORAL | Status: DC
  Administered 2021-01-30 – 2021-02-02 (×6): 600 mg via ORAL
  Filled 2021-01-30 (×9): qty 12

## 2021-01-30 MED ORDER — ACETAMINOPHEN 325 MG PO TABS
650.0000 mg | ORAL_TABLET | Freq: Four times a day (QID) | ORAL | Status: DC | PRN
  Administered 2021-01-31 – 2021-02-09 (×18): 650 mg via ORAL
  Filled 2021-01-30 (×18): qty 2

## 2021-01-30 MED ORDER — POLYVINYL ALCOHOL 1.4 % OP SOLN
2.0000 [drp] | OPHTHALMIC | Status: DC | PRN
  Filled 2021-01-30: qty 15

## 2021-01-30 MED ORDER — TRAMADOL HCL 50 MG PO TABS
50.0000 mg | ORAL_TABLET | Freq: Four times a day (QID) | ORAL | Status: DC | PRN
  Administered 2021-01-30 – 2021-02-04 (×3): 50 mg via ORAL
  Filled 2021-01-30 (×3): qty 1

## 2021-01-30 NOTE — Progress Notes (Signed)
Pt alert and oriented, tolerating diet. Report was called to [facility] at [facility].

## 2021-01-30 NOTE — Progress Notes (Addendum)
Inpatient Rehab Admissions Coordinator:   I have insurance approval and a bed for pt to admit today.  Dr. [place] Pink in agreement and pt/family/TOC/RN aware.  I will call for [facility] transport as soon as a I have a ready bed.    1435: Pt to go to room 53m5.  [facility] scheduled for pickup as soon as they have a truck available.    CShann Medal PT, DPT Admissions Coordinator [phone]/22/22  2:17 PM

## 2021-01-30 NOTE — TOC Progression Note (Signed)
Transition of Care [facility]) - Progression Note    Patient Details  Name: Joseph Banks MRN: [id number] Date of Birth: [birth year]  Transition of Care [facility] CM/SW Contact  Lennart Pall, LCSW Phone Number: 01/30/2021, 12:37 PM  Clinical Narrative:    Appeals for ins coverage of CIR are still waiting decision.  Per discussion with wife, IF they get a final denial for CIR, then she will likely want to pursue SNF "but it's up to him (pt)".  Wife does not feel she could provide necessary assistance pt needs currently.  TOC will continue to follow.   Expected Discharge Plan: IP Rehab Facility Barriers to Discharge: Continued Medical Work up  Expected Discharge Plan and Services Expected Discharge Plan: [place]   Discharge Planning Services: CM Consult   Living arrangements for the past 2 months: Single Family Home                                       Social Determinants of Health (SDOH) Interventions    Readmission Risk Interventions No flowsheet data found.

## 2021-01-30 NOTE — Progress Notes (Signed)
Joseph Banks  [id number] DOB: 31-May-1946 DOA: 01/12/2021 PCP: Glenda Chroman, MD    Brief Narrative:  75yo with a history of DM who had recently been diagnosed with prostatitis treated with ceftriaxone and bactrim but continued to experience worsening perianal pain with fever as high as 104. He was therefore changed to ciprofloxacin but symptoms persisted. CT at time of repeat presentation suggested a persistent bladder mass and enlarging prostate.  Significant Events:  Since admission the patient has been treated with antibiotic under the direction of the ID service.  His stay has been complicated by the development of new atrial fibrillation with RVR requiring medication titration.  He remained encephalopathic for an extended period of time before slowly regaining his cognition. 6/16 seen by Urology - UTI sx - rocephin IM + bactrim po 6/30 [facility] ED worsening pain/fever - CT abdom - cipro po 7/4 admit via [facility] ED > [facility] - persisting fevers/pain 7/4 transfer to ICU due to sepsis - encephalopathy 7/7 PICC line placement 7/8 LP under conscious sedation 7/13 MRI brain - no acute findings  7/17 PICC removed   Outpatient Follow-Up Issues: -will need Urology follow-up for foley cath removal if unable to be removed while in CIR  -monitor for recurrent/paroxysmal Afib   Consultants:  Infectious Disease Urology PCCM IR  Code Status: FULL CODE  Antimicrobials:  Vanc 7/4 >> 7/7 Meropenem 7/4 >> 7/7 Rocehin 7/7 >> 7/9 Ampicillin 7/7 >> 7/9 Doxy 7/7 > 7/13 Acyclovir 7/9 > 7/13  DVT prophylaxis: SCDs  Subjective: Patient denies any complaints.  Had a better night sleep last night.  No new issues   Assessment & Plan:  Acute prostatitis with severe sepsis POA Sepsis resolved - no prostatic abscess noted on MRI - Foley placed by Urology on admission - has now defervesced - has completed a course of antibiotic therapy - needs voiding trial when more ambulatory (this will need to be  overseen closely since risk for retention is high and to miss this could lead to a repeated rapid decline).  Per urology this can be done while he is at rehab.  Viral encephalitis - aseptic meningitis - toxic metabolic encephalopathy Status post LP 7/8 - HSV and Enterovirus PCR negative therefore acyclovir stopped - meningitis and encephalitis panel negative - symptoms included AMS, photophobia, nuchal rigidity, agitation. MRI brain done on 7/13 did not show any acute findings.  B12 folate and ammonia levels were normal. Mentation has significantly improved and seems to be getting close to his baseline.  Back pain Felt to be due to prolonged bedrest - improving w/ increased motion - should improve further w/ ongoing rehab - attempt to avoid narcotics to avoid clouding his sensorium   Dysphagia Resolved.  BPH Continue Foley per Urology - cont finasteride and flomax - with improving mobility expect foley can be discontinued for voiding trial once patient arrives on CIR - this will need to be closely supervised as unnoticed / prolonged obstruction/retention could have devastating consequences and lead to another prolonged hospital stay   Newly diagnosed transient atrial fibrillation with RVR Precipitated by sepsis -TSH normal - magnesium normal - NSR throughout second portion of hospital stay -given onset during severe acute illness will not commit patient to lifelong anticoagulation for now - this will need to be monitored in outpatient follow-up, as well as during strenuous exercise while on CIR   Acute kidney injury Felt to be related to bladder outlet obstruction which is now resolved with Foley -  renal function has normalized  Thrombocytopenia Resolved - due to sepsis  Transaminitis Likely shock liver - LFTs have normalized  DM2 CBG well controlled  Insomnia  Ambien seems to be helping.   Family Communication: No family at bedside today.  Discussed with patient.   Status is:  Inpatient  Remains inpatient appropriate because:Unsafe d/c plan  Dispo: The patient is from: Home              Anticipated d/c is to: CIR              Patient currently is medically stable to d/c.   Difficult to place patient No   Objective: Blood pressure 137/80, pulse 91, temperature 98.2 F (36.8 C), temperature source Oral, resp. rate 16, height '6\' 3"'$  (1.905 m), weight 97.4 kg, SpO2 100 %.  Intake/Output Summary (Last 24 hours) at 01/30/2021 1132 Last data filed at 01/30/2021 1000 Gross per 24 hour  Intake 840 ml  Output 1600 ml  Net -760 ml    Filed Weights   01/19/21 0652 01/20/21 0621 01/21/21 0500  Weight: 104.4 kg 101.7 kg 97.4 kg    Examination:  General appearance: Awake alert.  In no distress Resp: Clear to auscultation bilaterally.  Normal effort Cardio: S1-S2 is normal regular.  No S3-S4.  No rubs murmurs or bruit GI: Abdomen is soft.  Nontender nondistended.  Bowel sounds are present normal.  No masses organomegaly Extremities: No edema. Neurologic: No focal neurological deficits.     CBC: Recent Labs  Lab 01/25/21 0315  WBC 5.8  HGB 11.1*  HCT 33.6*  MCV 94.4  PLT [id number]    Basic Metabolic Panel: Recent Labs  Lab 01/25/21 0315 01/30/21 0407  NA 137 135  K 4.1 3.8  CL 106 107  CO2 23 22  GLUCOSE 131* 119*  BUN 22 16  CREATININE 0.83 0.76  CALCIUM 9.4 9.2  MG 2.3  --     GFR: Estimated Creatinine Clearance: 96.8 mL/min (by C-G formula based on SCr of 0.76 mg/dL).  Liver Function Tests: Recent Labs  Lab 01/25/21 0315  AST 22  ALT 28  ALKPHOS 45  BILITOT 0.9  PROT 7.7  ALBUMIN 3.9     CBG: Recent Labs  Lab 01/28/21 2119 01/29/21 0809 01/29/21 1616 01/29/21 2109 01/30/21 0743  GLUCAP 142* 137* 156* 140* 139*     No results found for this or any previous visit (from the past 240 hour(s)).     Scheduled Meds:  Chlorhexidine Gluconate Cloth  6 each Topical Daily   feeding supplement (GLUCERNA SHAKE)  237 mL Oral  TID BM   finasteride  5 mg Oral Daily   gabapentin  600 mg Oral BID   lidocaine  1 patch Transdermal Q24H   metFORMIN  500 mg Oral BID WC   tamsulosin  0.4 mg Oral Daily   zolpidem  5 mg Oral QHS      LOS: 18 days   [place] [facility] Office  [phone] Pager - Text Page per Shea Evans  If 7PM-7AM, please contact night-coverage per Amion 01/30/2021, 11:32 AM

## 2021-01-30 NOTE — H&P (Signed)
Physical Medicine and Rehabilitation Admission H&P    Chief Complaint  Patient presents with   Groin Pain  : HPI: Joseph Banks is a 75 year old right-handed male with history of diabetes mellitus as well as BPH and history of tobacco use.  Presented 01/12/2021 with nonspecific perianal pain as well as low-grade fever.  He had recently seen urology services December 25, 2020 diagnosed with prostatitis and epididymitis was given 1 shot of ceftriaxone and placed on Bactrim.  At that time patient had CT scan also placed on Cipro which patient had been taking for at least 4 days despite which patient's pain continued to worsen and fever up to 104 degrees.  In the ED he was tachycardic.  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 Junious Silk.  Blood cultures were obtained maintain on broad-spectrum antibiotics.  Admission chemistry sodium 132 BUN 24 creatinine 1.26 AST 181 ALT 107, hemoglobin 11.8, urine culture 1000 colony staphepidermidis, lactic acid 2.0.  A Foley catheter tube remained in place completing course of antibiotic therapy.  Underwent lumbar puncture for altered mental status HSV and enterovirus PCR negative he had initially been on acyclovir discontinued.  Meningitis encephalitis panel negative.  MRI 7/13 of the brain did not show any acute findings.  Hospital course complicated by newly diagnosed transient atrial fibrillation with RVR cardiology service follow-up felt to be precipitated by sepsis.  Cardiac rate remained controlled and monitored.  Nonspecific back pain placed on Lidoderm patch as well as use of tramadol.  In regards to transaminitis likely shock liver LFTs continue to normalize and monitor.  He did have some dysphagia diet slowly advance to mechanical soft thin liquids.  Therapy evaluations completed due to patient's decreased functional ability altered mental status was admitted for a comprehensive rehab  program.  Review of Systems  Constitutional:  Positive for fever and malaise/fatigue.  HENT:  Negative for hearing loss.   Eyes:  Negative for blurred vision and double vision.  Respiratory:  Negative for cough and shortness of breath.   Cardiovascular:  Positive for palpitations and leg swelling. Negative for chest pain.  Gastrointestinal:  Positive for constipation. Negative for heartburn, nausea and vomiting.  Genitourinary:  Negative for dysuria, flank pain and hematuria.  Musculoskeletal:  Positive for joint pain and myalgias.  Skin:  Negative for rash.  Neurological:  Positive for weakness.  Psychiatric/Behavioral:  The patient has insomnia.   All other systems reviewed and are negative. Past Medical History:  Diagnosis Date   BPH (benign prostatic hyperplasia)    Diabetes mellitus without complication [facility])    Past Surgical History:  Procedure Laterality Date   IR FLUORO GUIDED NEEDLE PLC ASPIRATION/INJECTION LOC  01/16/2021   TRANSURETHRAL RESECTION OF PROSTATE     History reviewed. No pertinent family history. Social History:  reports that he has quit smoking. He has never used smokeless tobacco. No history on file for alcohol use and drug use. Allergies: No Known Allergies Medications Prior to Admission  Medication Sig Dispense Refill   [EXPIRED] ciprofloxacin (CIPRO) 500 MG tablet Take 500 mg by mouth in the morning and at bedtime.     gabapentin (NEURONTIN) 600 MG tablet Take 1 tablet (600 mg total) by mouth 2 (two) times daily. 180 tablet 2   metFORMIN (GLUCOPHAGE) 500 MG tablet Take 500 mg by mouth 2 (two) times daily.     tadalafil (CIALIS) 5 MG tablet Take 1 tablet (5 mg total)  by mouth daily. 30 tablet 11   traZODone (DESYREL) 50 MG tablet Take 1-2 tablets (50-100 mg total) by mouth at bedtime as needed for sleep. (Patient taking differently: Take 50-100 mg by mouth at bedtime.) 180 tablet 3    Drug Regimen Review Drug regimen was reviewed and remains appropriate  with no significant issues identified  Home: Home Living Family/patient expects to be discharged to:: Private residence Living Arrangements: Spouse/significant other Available Help at Discharge: Family, Available 24 hours/day Type of Home: House Home Access: Stairs to enter Technical brewer of Steps: 2 Entrance Stairs-Rails: None Home Layout: One level Bathroom Shower/Tub: Multimedia programmer: Standard Bathroom Accessibility: Yes Additional Comments: home verified by wife   Functional History: Prior Function Level of Independence: Independent Comments: per family patient recently back to [place] area from [place], appears active at baseline  Functional Status:  Mobility: Bed Mobility Overal bed mobility: Needs Assistance Bed Mobility: Supine to Sit Supine to sit: Max assist General bed mobility comments: up in recilner Transfers Overall transfer level: Needs assistance Equipment used: Rolling walker (2 wheeled) Transfer via Lift Equipment: Stedy Transfers: Sit to/from [employer] to Stand: Min guard Stand pivot transfers: Min guard General transfer comment: Min guard with RW to ambulate to bathroom and back to recliner. Unable to rise from chair without upper extremity assistance x 2 attempts. Ambulation/Gait Ambulation/Gait assistance: +2 safety/equipment, Mod assist, Min assist Gait Distance (Feet): 55 Feet Assistive device: Rolling walker (2 wheeled) Gait Pattern/deviations: Step-to pattern, Decreased stride length, Shuffle, Trunk flexed General Gait Details: no gait, executed berg balance test Gait velocity: decreased    ADL: ADL Overall ADL's : Needs assistance/impaired Eating/Feeding: NPO Grooming: Standing, Min guard, Oral care Grooming Details (indicate cue type and reason): patient min guard to stand at sink and perform oral care for 1 min and 30 seconds. Upper Body Bathing: Maximal assistance, Sitting Upper Body Bathing Details (indicate cue  type and reason): poor/unsafe balance Lower Body Bathing: Total assistance Upper Body Dressing : Maximal assistance, Bed level, Sitting Lower Body Dressing: Min guard, Sit to/from stand Lower Body Dressing Details (indicate cue type and reason): Patient able to don socks from seated postion. Toilet Transfer: Min guard, Regular Toilet, Grab bars, Surveyor, mining Details (indicate cue type and reason): able to perform toilet transfer slowly and with use of grab bar Toileting- Clothing Manipulation and Hygiene: Total assistance, Bed level Functional mobility during ADLs: Minimal assistance, Rolling walker General ADL Comments: performed pseudo donning pant task. Required min guard for pulling pants up. Patient unsteady with hands off of the walker  Cognition: Cognition Overall Cognitive Status: Impaired/Different from baseline Arousal/Alertness: Awake/alert Orientation Level: Oriented X4 Memory:  (recalled 5/5 of items without cues, used compensation strategies of visualization and chunking together that his father taught him) Awareness: Impaired Awareness Impairment: Intellectual impairment (admits to some tasks being more challenging but also appears to try to diminish this with report of behaviors, etc) Problem Solving: Impaired Problem Solving Impairment: Verbal complex, Functional complex Executive Function: Self Monitoring, Self Correcting Self Monitoring: Impaired Self Monitoring Impairment: Verbal complex Self Correcting: Impaired Self Correcting Impairment: Verbal complex Comments: Pt able to verbalize purpose of testing is "to improve QOL" after he indicated his score on the SLUMS indicating "failing".  He recognized testing purpose was to find areas of focus for cognitive linguistic rehab to maximize his cognitive rehab to decrease caregiver burden. On SLUMS testing, visuospatial skills impacted as pt needed self correction x2 and he placed numbers on the  outside of the clock  without self correction.  Detailed information missed by pt resulting in answering functional math question, clock drawing and narrative questions incorrectly.  With repetition of math question at end of testing - pt able to easily answer correctly. Cognition Arousal/Alertness: Awake/alert Behavior During Therapy: WFL for tasks assessed/performed Overall Cognitive Status: Impaired/Different from baseline Area of Impairment: Attention, Memory, Following commands, Safety/judgement, Problem solving, Awareness Orientation Level: Time, Situation, Place Current Attention Level: Sustained Memory: Decreased short-term memory, Decreased recall of precautions Following Commands: Follows one step commands inconsistently Safety/Judgement: Decreased awareness of safety, Decreased awareness of deficits Awareness: Emergent Problem Solving: Slow processing, Decreased initiation, Difficulty sequencing, Requires verbal cues, Requires tactile cues General Comments: AxO x 3 very pleasant with increased cognition and self medical insight.  Physical Exam: Blood pressure 112/73, pulse 93, temperature 98.1 F (36.7 C), temperature source Oral, resp. rate 18, height '6\' 3"'$  (1.905 m), weight 97.4 kg, SpO2 96 %. Physical Exam HENT:     Head: Normocephalic.     Nose: Nose normal.  Cardiovascular:     Rate and Rhythm: Normal rate.  Pulmonary:     Effort: Pulmonary effort is normal.  Skin:    General: Skin is warm.  Neurological:     Mental Status: He is alert.     Comments: Patient is awake and alert.  Makes eye contact with examiner.  Follows commands.  Answer simple questions is name and age needed some cues for year with some delay in processing.    Results for orders placed or performed during the hospital encounter of 01/12/21 (from the past 48 hour(s))  Glucose, capillary     Status: Abnormal   Collection Time: 01/28/21  4:30 PM  Result Value Ref Range   Glucose-Capillary 164 (H) 70 - 99 mg/dL     Comment: Glucose reference range applies only to samples taken after fasting for at least 8 hours.  Glucose, capillary     Status: Abnormal   Collection Time: 01/28/21  9:19 PM  Result Value Ref Range   Glucose-Capillary 142 (H) 70 - 99 mg/dL    Comment: Glucose reference range applies only to samples taken after fasting for at least 8 hours.  Glucose, capillary     Status: Abnormal   Collection Time: 01/29/21  8:09 AM  Result Value Ref Range   Glucose-Capillary 137 (H) 70 - 99 mg/dL    Comment: Glucose reference range applies only to samples taken after fasting for at least 8 hours.  Glucose, capillary     Status: Abnormal   Collection Time: 01/29/21  4:16 PM  Result Value Ref Range   Glucose-Capillary 156 (H) 70 - 99 mg/dL    Comment: Glucose reference range applies only to samples taken after fasting for at least 8 hours.  Glucose, capillary     Status: Abnormal   Collection Time: 01/29/21  9:09 PM  Result Value Ref Range   Glucose-Capillary 140 (H) 70 - 99 mg/dL    Comment: Glucose reference range applies only to samples taken after fasting for at least 8 hours.  Basic metabolic panel     Status: Abnormal   Collection Time: 01/30/21  4:07 AM  Result Value Ref Range   Sodium 135 135 - 145 mmol/L   Potassium 3.8 3.5 - 5.1 mmol/L   Chloride 107 98 - 111 mmol/L   CO2 22 22 - 32 mmol/L   Glucose, Bld 119 (H) 70 - 99 mg/dL    Comment: Glucose reference  range applies only to samples taken after fasting for at least 8 hours.   BUN 16 8 - 23 mg/dL   Creatinine, Ser 0.76 0.61 - 1.24 mg/dL   Calcium 9.2 8.9 - 10.3 mg/dL   GFR, Estimated >60 >60 mL/min    Comment: (NOTE) Calculated using the CKD-EPI Creatinine Equation (2021)    Anion gap 6 5 - 15    Comment: Performed at [facility], [place] [address] [place], [place] [address]  Glucose, capillary     Status: Abnormal   Collection Time: 01/30/21  7:43 AM  Result Value Ref Range   Glucose-Capillary 139 (H) 70 - 99  mg/dL    Comment: Glucose reference range applies only to samples taken after fasting for at least 8 hours.   No results found.     Medical Problem List and Plan: 1.  Debility secondary to acute prostatitis with severe sepsis and associated complications  -patient may  shower  -ELOS/Goals: 10-14 days, mod I to supervision with PT/OT 2.  Antithrombotics: -DVT/anticoagulation: SCDs  -antiplatelet therapy:  3. Pain Management: Neurontin 600 mg twice daily, Lidoderm patch as directed, tramadol 50 mg every 6 hours as needed 4. Mood: Provide emotional support  -antipsychotic agents: N/A 5. Neuropsych: This patient is capable of making decisions on his own behalf. 6. Skin/Wound Care: Routine skin checks 7. Fluids/Electrolytes/Nutrition: Routine in and outs with follow-up chemistries 8.  Dysphagia.  Diet advanced to mechanical soft. 66.  Newly diagnosed transient atrial fibrillation RVR.  Felt to be related to sepsis.  Cardiac rate controlled.  Follow-up cardiology services from a distance 10.  Transaminitis.  LFTs normalizing.  Follow-up panel. 11.  Diabetes mellitus.  Glucophage 500 mg twice daily.  No SSI for now 12. Prostatitis: abx completed  -continue proscar and flomax  -DO NOT REMOVE FOLEY    Cathlyn Parsons, PA-C 01/30/2021

## 2021-01-30 NOTE — Progress Notes (Signed)
Inpatient Rehabilitation Medication Review by a Pharmacist  A complete drug regimen review was completed for this patient to identify any potential clinically significant medication issues.  Clinically significant medication issues were identified:  no  Check AMION for pharmacist assigned to patient if future medication questions/issues arise during this admission.   Time spent performing this drug regimen review (minutes):  Nixon, Coy, [place] 01/30/2021 7:55 PM

## 2021-01-30 NOTE — H&P (Signed)
Physical Medicine and Rehabilitation Admission H&P        Chief Complaint  Patient presents with   Groin Pain  : HPI: Joseph Banks is a 75 year old right-handed male with history of diabetes mellitus as well as BPH and history of tobacco use.  Presented 01/12/2021 with nonspecific perianal pain as well as low-grade fever.  He had recently seen urology services December 25, 2020 diagnosed with prostatitis and epididymitis was given 1 shot of ceftriaxone and placed on Bactrim.  At that time patient had CT scan also placed on Cipro which patient had been taking for at least 4 days despite which patient's pain continued to worsen and fever up to 104 degrees.  In the ED he was tachycardic.  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 Junious Silk.  Blood cultures were obtained maintain on broad-spectrum antibiotics.  Admission chemistry sodium 132 BUN 24 creatinine 1.26 AST 181 ALT 107, hemoglobin 11.8, urine culture 1000 colony staphepidermidis, lactic acid 2.0.  A Foley catheter tube remained in place completing course of antibiotic therapy.  Underwent lumbar puncture for altered mental status HSV and enterovirus PCR negative he had initially been on acyclovir discontinued.  Meningitis encephalitis panel negative.  MRI 7/13 of the brain did not show any acute findings.  Hospital course complicated by newly diagnosed transient atrial fibrillation with RVR cardiology service follow-up felt to be precipitated by sepsis.  Cardiac rate remained controlled and monitored.  Nonspecific back pain placed on Lidoderm patch as well as use of tramadol.  In regards to transaminitis likely shock liver LFTs continue to normalize and monitor.  He did have some dysphagia diet slowly advance to mechanical soft thin liquids.  Therapy evaluations completed due to patient's decreased functional ability altered mental status was admitted for a comprehensive  rehab program.   Review of Systems Constitutional:  Positive for fever and malaise/fatigue. HENT:  Negative for hearing loss.   Eyes:  Negative for blurred vision and double vision. Respiratory:  Negative for cough and shortness of breath.   Cardiovascular:  Positive for palpitations and leg swelling. Negative for chest pain. Gastrointestinal:  Positive for constipation. Negative for heartburn, nausea and vomiting. Genitourinary:  Negative for dysuria, flank pain and hematuria. Musculoskeletal:  Positive for joint pain and myalgias. Skin:  Negative for rash. Neurological:  Positive for weakness. Psychiatric/Behavioral:  The patient has insomnia.   All other systems reviewed and are negative.     Past Medical History:  Diagnosis Date   BPH (benign prostatic hyperplasia)     Diabetes mellitus without complication [facility])           Past Surgical History:  Procedure Laterality Date   IR FLUORO GUIDED NEEDLE PLC ASPIRATION/INJECTION LOC   01/16/2021   TRANSURETHRAL RESECTION OF PROSTATE        History reviewed. No pertinent family history. Social History:  reports that he has quit smoking. He has never used smokeless tobacco. No history on file for alcohol use and drug use. Allergies: No Known Allergies       Medications Prior to Admission  Medication Sig Dispense Refill   [EXPIRED] ciprofloxacin (CIPRO) 500 MG tablet Take 500 mg by mouth in the morning and at bedtime.       gabapentin (NEURONTIN) 600 MG tablet Take 1 tablet (600 mg total) by mouth 2 (two) times daily. 180 tablet 2   metFORMIN (GLUCOPHAGE) 500 MG tablet Take 500  mg by mouth 2 (two) times daily.       tadalafil (CIALIS) 5 MG tablet Take 1 tablet (5 mg total) by mouth daily. 30 tablet 11   traZODone (DESYREL) 50 MG tablet Take 1-2 tablets (50-100 mg total) by mouth at bedtime as needed for sleep. (Patient taking differently: Take 50-100 mg by mouth at bedtime.) 180 tablet 3      Drug Regimen Review Drug regimen was  reviewed and remains appropriate with no significant issues identified   Home: Home Living Family/patient expects to be discharged to:: Private residence Living Arrangements: Spouse/significant other Available Help at Discharge: Family, Available 24 hours/day Type of Home: House Home Access: Stairs to enter Technical brewer of Steps: 2 Entrance Stairs-Rails: None Home Layout: One level Bathroom Shower/Tub: Multimedia programmer: Standard Bathroom Accessibility: Yes Additional Comments: home verified by wife   Functional History: Prior Function Level of Independence: Independent Comments: per family patient recently back to [place] area from [place], appears active at baseline   Functional Status:  Mobility: Bed Mobility Overal bed mobility: Needs Assistance Bed Mobility: Supine to Sit Supine to sit: Max assist General bed mobility comments: up in recilner Transfers Overall transfer level: Needs assistance Equipment used: Rolling walker (2 wheeled) Transfer via Lift Equipment: Stedy Transfers: Sit to/from [employer] to Stand: Min guard Stand pivot transfers: Min guard General transfer comment: Min guard with RW to ambulate to bathroom and back to recliner. Unable to rise from chair without upper extremity assistance x 2 attempts. Ambulation/Gait Ambulation/Gait assistance: +2 safety/equipment, Mod assist, Min assist Gait Distance (Feet): 55 Feet Assistive device: Rolling walker (2 wheeled) Gait Pattern/deviations: Step-to pattern, Decreased stride length, Shuffle, Trunk flexed General Gait Details: no gait, executed berg balance test Gait velocity: decreased   ADL: ADL Overall ADL's : Needs assistance/impaired Eating/Feeding: NPO Grooming: Standing, Min guard, Oral care Grooming Details (indicate cue type and reason): patient min guard to stand at sink and perform oral care for 1 min and 30 seconds. Upper Body Bathing: Maximal assistance, Sitting Upper Body  Bathing Details (indicate cue type and reason): poor/unsafe balance Lower Body Bathing: Total assistance Upper Body Dressing : Maximal assistance, Bed level, Sitting Lower Body Dressing: Min guard, Sit to/from stand Lower Body Dressing Details (indicate cue type and reason): Patient able to don socks from seated postion. Toilet Transfer: Min guard, Regular Toilet, Grab bars, Surveyor, mining Details (indicate cue type and reason): able to perform toilet transfer slowly and with use of grab bar Toileting- Clothing Manipulation and Hygiene: Total assistance, Bed level Functional mobility during ADLs: Minimal assistance, Rolling walker General ADL Comments: performed pseudo donning pant task. Required min guard for pulling pants up. Patient unsteady with hands off of the walker   Cognition: Cognition Overall Cognitive Status: Impaired/Different from baseline Arousal/Alertness: Awake/alert Orientation Level: Oriented X4 Memory:  (recalled 5/5 of items without cues, used compensation strategies of visualization and chunking together that his father taught him) Awareness: Impaired Awareness Impairment: Intellectual impairment (admits to some tasks being more challenging but also appears to try to diminish this with report of behaviors, etc) Problem Solving: Impaired Problem Solving Impairment: Verbal complex, Functional complex Executive Function: Self Monitoring, Self Correcting Self Monitoring: Impaired Self Monitoring Impairment: Verbal complex Self Correcting: Impaired Self Correcting Impairment: Verbal complex Comments: Pt able to verbalize purpose of testing is "to improve QOL" after he indicated his score on the SLUMS indicating "failing".  He recognized testing purpose was to find areas of focus for cognitive linguistic  rehab to maximize his cognitive rehab to decrease caregiver burden. On SLUMS testing, visuospatial skills impacted as pt needed self correction x2 and he placed numbers  on the outside of the clock without self correction.  Detailed information missed by pt resulting in answering functional math question, clock drawing and narrative questions incorrectly.  With repetition of math question at end of testing - pt able to easily answer correctly. Cognition Arousal/Alertness: Awake/alert Behavior During Therapy: WFL for tasks assessed/performed Overall Cognitive Status: Impaired/Different from baseline Area of Impairment: Attention, Memory, Following commands, Safety/judgement, Problem solving, Awareness Orientation Level: Time, Situation, Place Current Attention Level: Sustained Memory: Decreased short-term memory, Decreased recall of precautions Following Commands: Follows one step commands inconsistently Safety/Judgement: Decreased awareness of safety, Decreased awareness of deficits Awareness: Emergent Problem Solving: Slow processing, Decreased initiation, Difficulty sequencing, Requires verbal cues, Requires tactile cues General Comments: AxO x 3 very pleasant with increased cognition and self medical insight.   Physical Exam: Blood pressure 112/73, pulse 93, temperature 98.1 F (36.7 C), temperature source Oral, resp. rate 18, height '6\' 3"'$  (1.905 m), weight 97.4 kg, SpO2 96 %. Physical Exam HENT:    Head: Normocephalic.    Nose: Nose normal. Cardiovascular:    Rate and Rhythm: Normal rate. Pulmonary:    Effort: Pulmonary effort is normal. Skin:    General: Skin is warm. Neurological:    Mental Status: He is alert.    Comments: Patient is awake and alert.  Makes eye contact with examiner.  Follows commands.  Answer simple questions is name and age needed some cues for year with some delay in processing.      Lab Results Last 48 Hours        Results for orders placed or performed during the hospital encounter of 01/12/21 (from the past 48 hour(s))  Glucose, capillary     Status: Abnormal    Collection Time: 01/28/21  4:30 PM  Result Value Ref  Range    Glucose-Capillary 164 (H) 70 - 99 mg/dL      Comment: Glucose reference range applies only to samples taken after fasting for at least 8 hours.  Glucose, capillary     Status: Abnormal    Collection Time: 01/28/21  9:19 PM  Result Value Ref Range    Glucose-Capillary 142 (H) 70 - 99 mg/dL      Comment: Glucose reference range applies only to samples taken after fasting for at least 8 hours.  Glucose, capillary     Status: Abnormal    Collection Time: 01/29/21  8:09 AM  Result Value Ref Range    Glucose-Capillary 137 (H) 70 - 99 mg/dL      Comment: Glucose reference range applies only to samples taken after fasting for at least 8 hours.  Glucose, capillary     Status: Abnormal    Collection Time: 01/29/21  4:16 PM  Result Value Ref Range    Glucose-Capillary 156 (H) 70 - 99 mg/dL      Comment: Glucose reference range applies only to samples taken after fasting for at least 8 hours.  Glucose, capillary     Status: Abnormal    Collection Time: 01/29/21  9:09 PM  Result Value Ref Range    Glucose-Capillary 140 (H) 70 - 99 mg/dL      Comment: Glucose reference range applies only to samples taken after fasting for at least 8 hours.  Basic metabolic panel     Status: Abnormal    Collection Time: 01/30/21  4:07  AM  Result Value Ref Range    Sodium 135 135 - 145 mmol/L    Potassium 3.8 3.5 - 5.1 mmol/L    Chloride 107 98 - 111 mmol/L    CO2 22 22 - 32 mmol/L    Glucose, Bld 119 (H) 70 - 99 mg/dL      Comment: Glucose reference range applies only to samples taken after fasting for at least 8 hours.    BUN 16 8 - 23 mg/dL    Creatinine, Ser 0.76 0.61 - 1.24 mg/dL    Calcium 9.2 8.9 - 10.3 mg/dL    GFR, Estimated >60 >60 mL/min      Comment: (NOTE) Calculated using the CKD-EPI Creatinine Equation (2021)      Anion gap 6 5 - 15      Comment: Performed at [facility], [place] [address] [place], [place] [address]  Glucose, capillary     Status: Abnormal     Collection Time: 01/30/21  7:43 AM  Result Value Ref Range    Glucose-Capillary 139 (H) 70 - 99 mg/dL      Comment: Glucose reference range applies only to samples taken after fasting for at least 8 hours.      Imaging Results (Last 48 hours)  No results found.           Medical Problem List and Plan: 1.  Debility secondary to acute prostatitis with severe sepsis and associated complications             -patient may  shower             -ELOS/Goals: 10-14 days, mod I to supervision with PT/OT 2.  Antithrombotics: -DVT/anticoagulation: SCDs             -antiplatelet therapy: 3. Pain Management: Neurontin 600 mg twice daily, Lidoderm patch as directed, tramadol 50 mg every 6 hours as needed 4. Mood: Provide emotional support             -antipsychotic agents: N/A 5. Neuropsych: This patient is capable of making decisions on his own behalf. 6. Skin/Wound Care: Routine skin checks 7. Fluids/Electrolytes/Nutrition: Routine in and outs with follow-up chemistries 8.  Dysphagia.  Diet advanced to mechanical soft. 39.  Newly diagnosed transient atrial fibrillation RVR.  Felt to be related to sepsis.  Cardiac rate controlled.  Follow-up cardiology services from a distance 10.  Transaminitis.  LFTs normalizing.  Follow-up panel. 11.  Diabetes mellitus.  Glucophage 500 mg twice daily.  No SSI for now 12. Prostatitis: abx completed             -continue proscar and flomax             -DO NOT REMOVE FOLEY       Elizabeth Sauer 01/30/2021   Meredith Staggers, MD, [place] [facility] 01/30/2021

## 2021-01-30 NOTE — Evaluation (Signed)
SLP Cancellation Note  Patient Details Name: TYKING IKERD MRN: [id number] DOB: 1945/08/25   Cancelled treatment:       Reason Eval/Treat Not Completed: Other (comment) (Received order for swallow evaluation, contacted MD who deemed evaluation not indicated. SLP will dc order per MD. Thanks.)  Kathleen Lime, MS [facility] SLP [place] Office [phone] Pager [phone]  Macario Golds 01/30/2021, 9:20 AM

## 2021-01-30 NOTE — Progress Notes (Signed)
PMR Admission Coordinator Pre-Admission Assessment   Patient: Joseph Banks is an 75 y.o., male MRN: [id number] DOB: May 15, 1946 Height: _0  (190.5 cm) Weight: 97.4 kg   Insurance Information HMO:     PPO: yes     PCP:      IPA:      80/20:      OTHER: PRIMARY: [employer] Medicare      Policy#: [id number]      Subscriber: pt CM Name: Jeani Hawking       Phone#:      Fax#: [phone] Pre-Cert#: [id number] auth for CIR provided through expedited appeal from [facility] with [facility].  Updates due on 7/26 to  Dewitt Rota (phone [phone]) at fax listed above.     Employer: Benefits:  Phone #: [phone]     Name: 7/15 Eff. Date: 07/12/2020     Deduct: none      Out of Pocket Max: $5900      Life Max: none CIR: $375 co pay per day days 1 until 5      SNF: no copay days 1 until 20; $188 co pay per day days 21 until 100 Outpatient: $35 per visit     Co-Pay: visits per medical neccesity Home Health: 100%      Co-Pay: visits per medical neccesity DME: 80%     Co-Pay: 20% Providers: in network  SECONDARY: none   Financial Counselor:       Phone#:   The Engineer, petroleum" for patients in Inpatient Rehabilitation Facilities with attached "Privacy Act [employer] Records" was provided and verbally reviewed with: Patient and Family    Emergency Contact Information Contact Information       Name Relation Home Work [place] Spouse [phone]   [phone]    [place] Daughter     [phone]    Durell, Lofaso     [phone]           Current Medical History  Patient Admitting Diagnosis: debility   History of Present Illness:    75 year old male with history of diabetes who has been experiencing perianal pain and seen by Dr Jeffie Pollock urologist on June 16 and diagnosed with prostatitis and epididymitis. Treated with ceftriaxone and Bactrim. Worsening symptoms and went to ED at [facility] with fever of 104 with difficulty urinating and darker urine. CT suggested a persistence bladder mass and enlarging prostate.   Presented on 01/12/2021 to [facility] and ID service consulted and antibiotic coverage under their direction. Felt acute prostatitis with severe sepsis. Sepsis resolved and no prostatic abscess noted on MRI. Foley placed by Urology and completed course of antibiotic. Need voiding trials when more ambulatory.    Status LP 7/8 with HSV and Enterovirus PCR negative therefore Acyclovir stopped. Meningitis and encephalitis panels negative. Symptoms included photophobia, nuchal rigidity , agitation have resolved. MRI brain 7.13 without acute findings. B12, folate and ammonia levels normal. Urology consulted and placed foley. Continue Finasteride and flomax.    Patient's medical record from [facility] has been reviewed by the rehabilitation admission coordinator and physician.   Past Medical History      Past Medical History:  Diagnosis Date   BPH (benign prostatic hyperplasia)     Diabetes mellitus without complication ([place])        Family History   family history is not on file.   Prior Rehab/Hospitalizations Has the patient had prior rehab or hospitalizations prior to admission? Yes   Has the patient had major surgery during 100 days prior to admission? No                Current Medications   Current Facility-Administered Medications:   acetaminophen (TYLENOL) tablet 650 mg, 650 mg, Oral, Q6H PRN, Cherene Altes, MD, 650 mg at 01/30/21 7564   Chlorhexidine Gluconate Cloth 2 % PADS 6 each, 6 each, Topical, Daily, Cherene Altes, MD, 6 each at 01/29/21 1115   feeding supplement (GLUCERNA SHAKE) (GLUCERNA SHAKE) liquid 237 mL, 237 mL, Oral, TID BM, Cherene Altes, MD, 237 mL at 01/30/21 0856   finasteride (PROSCAR) tablet 5 mg, 5 mg, Oral, Daily, Festus Aloe, MD,  5 mg at 01/30/21 0855   gabapentin (NEURONTIN) 250 MG/5ML solution 600 mg, 600 mg, Oral, BID, Cherene Altes, MD, 600 mg at 01/30/21 0855   lidocaine (LIDODERM) 5 % 1 patch, 1 patch, Transdermal, Q24H, Carlyle Basques, MD, 1 patch at 01/29/21 2108   lip balm (CARMEX) ointment, , Topical, PRN, Dwyane Dee, MD   metFORMIN (GLUCOPHAGE) tablet 500 mg, 500 mg, Oral, BID WC, Cherene Altes, MD, 500 mg at 01/30/21 0855   ondansetron (ZOFRAN) tablet 4 mg, 4 mg, Oral, Q8H PRN, Cherene Altes, MD   polyvinyl alcohol (LIQUIFILM TEARS) 1.4 % ophthalmic solution 2 drop, 2 drop, Both Eyes, PRN, Dwyane Dee, MD   tamsulosin (FLOMAX) capsule 0.4 mg, 0.4 mg, Oral, Daily, Joette Catching T, MD, 0.4 mg at 01/30/21 0856   traMADol (ULTRAM) tablet 50 mg, 50 mg, Oral, Q6H PRN, Cherene Altes, MD, 50 mg at 01/29/21 1904   zolpidem (AMBIEN) tablet 5 mg, 5 mg, Oral, QHS, Cherene Altes, MD, 5 mg at 01/29/21 2107   Patients Current Diet:  Diet Order                  DIET DYS 3 Room service appropriate? Yes; Fluid consistency: Thin  Diet effective now                         Precautions / Restrictions Precautions Precautions: Fall Precaution Comments: anterior lean with standing Restrictions Weight Bearing Restrictions: No    Has the patient had 2 or more falls or a fall with injury in the past year? No   Prior Activity Level Community (5-7x/wk): Independent, active  and retired   Prior Functional Level Self Care: Did the patient need help bathing, dressing, using the toilet or eating? Independent   Indoor Mobility: Did the patient need assistance with walking from room to room (with or without device)? Independent   Stairs: Did the patient need assistance with internal or external stairs (with or without device)? Independent   Functional Cognition: Did the patient need help planning regular tasks such as shopping or remembering to take medications? Independent   Home Assistive Devices / Equipment Home Assistive Devices/Equipment: None   Prior Device Use: Indicate devices/aids used by the patient prior to current illness, exacerbation or injury? Manual wheelchair   Current Functional Level Cognition   Arousal/Alertness: Awake/alert Overall Cognitive Status: Impaired/Different from baseline Current Attention Level: Sustained Orientation Level: Oriented X4 Following Commands: Follows one step commands inconsistently Safety/Judgement: Decreased awareness of safety, Decreased awareness of deficits General Comments: AxO x 3 very pleasant with increased cognition and self medical insight. Memory:  (recalled 5/5 of items without cues, used compensation strategies of visualization and chunking together that his father taught him) Awareness: Impaired Awareness Impairment: Intellectual impairment (admits to some tasks being more challenging but also appears to try to diminish this with report of behaviors, etc) Problem Solving: Impaired Problem Solving Impairment: Verbal complex, Functional complex Executive Function: Self Monitoring, Self Correcting Self Monitoring: Impaired Self Monitoring Impairment: Verbal complex Self Correcting: Impaired Self Correcting Impairment: Verbal complex Comments: Pt able to verbalize purpose of testing is "to improve QOL" after he indicated his score on the SLUMS indicating "failing".  He recognized testing purpose was to  find areas of focus for cognitive linguistic rehab to maximize his cognitive rehab to decrease caregiver burden. On SLUMS testing, visuospatial skills impacted as pt needed self correction x2 and he placed numbers on the outside of  the clock without self correction.  Detailed information missed by pt resulting in answering functional math question, clock drawing and narrative questions incorrectly.  With repetition of math question at end of testing - pt able to easily answer correctly.    Extremity Assessment (includes Sensation/Coordination)   Upper Extremity Assessment: Generalized weakness  Lower Extremity Assessment: Generalized weakness     ADLs   Overall ADL's : Needs assistance/impaired Eating/Feeding: NPO Grooming: Standing, Min guard, Oral care Grooming Details (indicate cue type and reason): patient min guard to stand at sink and perform oral care for 1 min and 30 seconds. Upper Body Bathing: Maximal assistance, Sitting Upper Body Bathing Details (indicate cue type and reason): poor/unsafe balance Lower Body Bathing: Total assistance Upper Body Dressing : Maximal assistance, Bed level, Sitting Lower Body Dressing: Min guard, Sit to/from stand Lower Body Dressing Details (indicate cue type and reason): Patient able to don socks from seated postion. Toilet Transfer: Min guard, Regular Toilet, Grab bars, Surveyor, mining Details (indicate cue type and reason): able to perform toilet transfer slowly and with use of grab bar Toileting- Clothing Manipulation and Hygiene: Total assistance, Bed level Functional mobility during ADLs: Minimal assistance, Rolling walker General ADL Comments: performed pseudo donning pant task. Required min guard for pulling pants up. Patient unsteady with hands off of the walker     Mobility   Overal bed mobility: Needs Assistance Bed Mobility: Supine to Sit Supine to sit: Max assist General bed mobility comments: up in recilner     Transfers    Overall transfer level: Needs assistance Equipment used: Rolling walker (2 wheeled) Transfer via Lift Equipment: Stedy Transfers: Sit to/from [employer] to Stand: Min guard Stand pivot transfers: Min guard General transfer comment: Min guard with RW to ambulate to bathroom and back to recliner. Unable to rise from chair without upper extremity assistance x 2 attempts.     Ambulation / Gait / Stairs / Wheelchair Mobility   Ambulation/Gait Ambulation/Gait assistance: +2 safety/equipment, Mod assist, Min assist Gait Distance (Feet): 55 Feet Assistive device: Rolling walker (2 wheeled) Gait Pattern/deviations: Step-to pattern, Decreased stride length, Shuffle, Trunk flexed General Gait Details: no gait, executed berg balance test Gait velocity: decreased     Posture / Balance Dynamic Sitting Balance Sitting balance - Comments: able to bend down and reach feet Balance Overall balance assessment: Needs assistance Sitting-balance support: No upper extremity supported, Feet supported Sitting balance-Leahy Scale: Good Sitting balance - Comments: able to bend down and reach feet Postural control: Other (comment) (anterior lean) Standing balance support: During functional activity Standing balance-Leahy Scale: Fair Standing balance comment: Unsteady when hands off of the walker. Able to release wealker briefly for ADL task. Standardized Balance Assessment Standardized Balance Assessment : Berg Balance Test Berg Balance Test Sit to Stand: Needs minimal aid to stand or to stabilize Standing Unsupported: Able to stand 2 minutes with supervision Sitting with Back Unsupported but Feet Supported on Floor or Stool: Able to sit safely and securely 2 minutes Stand to Sit: Uses backs of legs against chair to control descent Transfers: Able to transfer safely, definite need of hands Standing Unsupported with Eyes Closed: Able to stand 10 seconds with supervision Standing Ubsupported with Feet Together:  Needs help to attain position and unable to hold for 15 seconds From Standing, Reach Forward with Outstretched Arm: Reaches forward but needs supervision From Standing Position, Pick up Object from Floor: Unable to pick up shoe, but reaches 2-5 cm (1-2") from shoe and balances  independently From Standing Position, Turn to Look Behind Over each Shoulder: Needs supervision when turning Turn 360 Degrees: Needs close supervision or verbal cueing Standing Unsupported, Alternately Place Feet on Step/Stool: Needs assistance to keep from falling or unable to try Standing Unsupported, One Foot in Front: Loses balance while stepping or standing Standing on One Leg: Unable to try or needs assist to prevent fall Total Score: 21     Special needs/care consideration Indwelling catheter placed by Urology 01/13/2021 Dr Junious Silk non- latex Straight-tip 20 Fr    Previous Home Environment  Living Arrangements: Spouse/significant other Available Help at Discharge: Family, Available 24 hours/day Type of Home: [place]: One level Home Access: Stairs to enter Entrance Stairs-Rails: None Entrance Stairs-Number of Steps: 2 Bathroom Shower/Tub: Multimedia programmer: Standard Bathroom Accessibility: Yes How Accessible: Accessible via walker [place]: No Additional Comments: home verified by wife   Discharge Living Setting Plans for Discharge Living Setting: Patient's home, Lives with (comment) (wife) Type of Home at Discharge: House Discharge Home Layout: One level Discharge Home Access: Stairs to enter Entrance Stairs-Rails: None Entrance Stairs-Number of Steps: 2 Discharge Bathroom Shower/Tub: Walk-in shower Discharge Bathroom Toilet: Standard Discharge Bathroom Accessibility: Yes How Accessible: Accessible via walker Does the patient have any problems obtaining your medications?: No   Social/Family/Support Systems Patient Roles: Spouse, Parent Contact Information: wife,  [name] Anticipated Caregiver: wife Anticipated Ambulance person Information: see above Ability/Limitations of Caregiver: no limitations Caregiver Availability: 24/7 Discharge Plan Discussed with Primary Caregiver: Yes Is Caregiver In Agreement with Plan?: Yes Does Caregiver/Family have Issues with Lodging/Transportation while Pt is in Rehab?: No   Goals Patient/Family Goal for Rehab: Mod I to supervision with PT and OT Expected length of stay: ELOS 10 to 14 days Pt/Family Agrees to Admission and willing to participate: Yes Program Orientation Provided & Reviewed with Pt/Caregiver Including Roles  & Responsibilities: Yes   Decrease burden of Care through IP rehab admission: n/a   Possible need for SNF placement upon discharge: not antiicpated   Patient Condition: I have reviewed medical records from [facility] , spoken with CSW, and patient, spouse, and daughter. I met with patient at the bedside for inpatient rehabilitation assessment.  Patient will benefit from ongoing PT, OT, and SLP, can actively participate in 3 hours of therapy a day 5 days of the week, and can make measurable gains during the admission.  Patient will also benefit from the coordinated team approach during an Inpatient Acute Rehabilitation admission.  The patient will receive intensive therapy as well as Rehabilitation physician, nursing, social worker, and care management interventions.  Due to bladder management, bowel management, safety, skin/wound care, disease management, medication administration, pain management, and patient education the patient requires 24 hour a day rehabilitation nursing.  The patient is currently min assist overall with mobility and basic ADLs.  Discharge setting and therapy post discharge at home with outpatient is anticipated.  Patient has agreed to participate in the Acute Inpatient Rehabilitation Program and will admit today.   Preadmission Screen Completed By: Danne Baxter  RN MSN with updates by Michel Santee, 01/30/2021 2:19 PM ______________________________________________________________________   Discussed status with Dr. Naaman Plummer on 01/30/21  at 2:19 PM  and received approval for admission today.   Admission Coordinator: Danne Baxter RN MSN with updates by Michel Santee, PT, time 2:19 PM Sudie Grumbling 01/30/21     Assessment/Plan: Diagnosis: debility Does the need for close, 24 hr/day Medical supervision in concert with  the patient's rehab needs make it unreasonable for this patient to be served in a less intensive setting? Yes Co-Morbidities requiring supervision/potential complications: dm, bph, sepsis Due to bladder management, bowel management, safety, skin/wound care, disease management, medication administration, pain management, and patient education, does the patient require 24 hr/day rehab nursing? Yes Does the patient require coordinated care of a physician, rehab nurse, PT, OT to address physical and functional deficits in the context of the above medical diagnosis(es)? Yes Addressing deficits in the following areas: balance, endurance, locomotion, strength, transferring, bowel/bladder control, bathing, dressing, feeding, grooming, toileting, and psychosocial support Can the patient actively participate in an intensive therapy program of at least 3 hrs of therapy 5 days a week? Yes The potential for patient to make measurable gains while on inpatient rehab is excellent Anticipated functional outcomes upon discharge from inpatient rehab: modified independent and supervision PT, modified independent and supervision OT, n/a SLP Estimated rehab length of stay to reach the above functional goals is: 10-14 days Anticipated discharge destination: Home 10. Overall Rehab/Functional Prognosis: excellent     MD Signature: Meredith Staggers, MD, [facility] 01/30/2021

## 2021-01-30 NOTE — Progress Notes (Signed)
Patient arrived to unit and oriented to unit processes. Patient up in recliner, safety belt in place, wife at bedside. No complaints or concerns at this time. Resting comfortably with call bell in reach

## 2021-01-30 NOTE — Discharge Summary (Signed)
[facility]  Physician Discharge Summary   Patient ID: Joseph Banks MRN: [id number] DOB/AGE: 1945-08-09 75 y.o.  Admit date: 01/12/2021 Discharge date:   01/30/2021   PCP: Glenda Chroman, MD  DISCHARGE DIAGNOSES:  Acute prostatitis with severe sepsis prior to admission Viral encephalitis/aseptic meningitis BPH Transient atrial fibrillation not on anticoagulation Acute kidney injury, resolved    RECOMMENDATIONS FOR OUTPATIENT FOLLOW UP: Please do a voiding trial once the patient is more active Patient will need follow-up with Dr. Diona Fanti with urology in the [place] office    Home Health: Inpatient rehab Equipment/Devices: None  CODE STATUS: Full code  DISCHARGE CONDITION: fair  Diet recommendation: Modified carbohydrate  INITIAL HISTORY: 75yo with a history of DM who had recently been diagnosed with prostatitis treated with ceftriaxone and bactrim but continued to experience worsening perianal pain with fever as high as 104. He was therefore changed to ciprofloxacin but symptoms persisted. CT at time of repeat presentation suggested a persistent bladder mass and enlarging prostate.   Significant Events: Since admission the patient has been treated with antibiotic under the direction of the ID service.  His stay has been complicated by the development of new atrial fibrillation with RVR requiring medication titration.  He remained encephalopathic for an extended period of time before slowly regaining his cognition. 6/16 seen by Urology - UTI sx - rocephin IM + bactrim po 6/30 [facility] ED worsening pain/fever - CT abdom - cipro po 7/4 admit via [facility] ED > [facility] - persisting fevers/pain 7/4 transfer to ICU due to sepsis - encephalopathy 7/7 PICC line placement 7/8 LP under conscious sedation 7/13 MRI brain - no acute findings 7/17 PICC removed   Consultants: Infectious Disease Urology PCCM IR   Antimicrobials:  Vanc 7/4 >> 7/7 Meropenem 7/4 >>  7/7 Rocehin 7/7 >> 7/9 Ampicillin 7/7 >> 7/9 Doxy 7/7 > 7/13 Acyclovir 7/9 > 7/13      HOSPITAL COURSE:   Acute prostatitis with severe sepsis POA Sepsis resolved - no prostatic abscess noted on MRI. Foley placed by Urology on admission. Patient improved.  Completed course of antibiotics.  Voiding trial once the patient is more active.  Will need to be done at inpatient rehabilitation.  This can be coordinated with Dr. Diona Fanti with urology.   Viral encephalitis - aseptic meningitis - toxic metabolic encephalopathy Symptoms included AMS, photophobia, nuchal rigidity, agitation. Status post LP 7/8. HSV and Enterovirus PCR negative therefore acyclovir stopped. Meningitis and encephalitis panel negative MRI brain done on 7/13 did not show any acute findings.  B12 folate and ammonia levels were normal. Mentation has significantly improved and seems to be getting close to his baseline.   Back pain Felt to be due to prolonged bedrest - improving w/ increased motion - should improve further w/ ongoing rehab - attempt to avoid narcotics to avoid clouding his sensorium   Dysphagia Resolved.   BPH Continue Foley per Urology - cont finasteride and flomax - with improving mobility expect foley can be discontinued for voiding trial once patient arrives on CIR. This will need to be closely supervised as unnoticed / prolonged obstruction/retention could have devastating consequences and lead to another prolonged hospital stay   Newly diagnosed transient atrial fibrillation with RVR Precipitated by sepsis -TSH normal - magnesium normal - NSR throughout second portion of hospital stay Given onset during severe acute illness will not commit patient to lifelong anticoagulation for now.  This will need to be monitored in outpatient follow-up, as well as  during strenuous exercise while on CIR   Acute kidney injury Felt to be related to bladder outlet obstruction which is now resolved with Foley    Thrombocytopenia Resolved - due to sepsis   Transaminitis Likely shock liver - LFTs have normalized   DM2 CBG well controlled   Insomnia  Ambien seems to be helping.  Patient is stable for discharge to [facility] inpatient rehabilitation.  PERTINENT LABS:  The results of significant diagnostics from this hospitalization (including imaging, microbiology, ancillary and laboratory) are listed below for reference.      Labs:    Basic Metabolic Panel: Recent Labs  Lab 01/25/21 0315 01/30/21 0407  NA 137 135  K 4.1 3.8  CL 106 107  CO2 23 22  GLUCOSE 131* 119*  BUN 22 16  CREATININE 0.83 0.76  CALCIUM 9.4 9.2  MG 2.3  --    Liver Function Tests: Recent Labs  Lab 01/25/21 0315  AST 22  ALT 28  ALKPHOS 45  BILITOT 0.9  PROT 7.7  ALBUMIN 3.9    CBC: Recent Labs  Lab 01/25/21 0315  WBC 5.8  HGB 11.1*  HCT 33.6*  MCV 94.4  PLT 388     CBG: Recent Labs  Lab 01/28/21 2119 01/29/21 0809 01/29/21 1616 01/29/21 2109 01/30/21 0743  GLUCAP 142* 137* 156* 140* 139*     IMAGING STUDIES DG Abd 1 View  Result Date: 01/19/2021 CLINICAL DATA:  NG tube placement EXAM: ABDOMEN - 1 VIEW COMPARISON:  01/13/2021 FINDINGS: Feeding tube is in place with the tip in the distal stomach. Right PICC line tip is in the right atrium. Nonobstructive bowel gas pattern. No organomegaly or free air. IMPRESSION: Feeding tube tip in the distal stomach. Electronically Signed   By: Rolm Baptise M.D.   On: 01/19/2021 08:21   DG Abd 1 View  Result Date: 01/13/2021 CLINICAL DATA:  NG tube placement EXAM: ABDOMEN - 1 VIEW COMPARISON:  CT 01/12/2021 FINDINGS: Esophageal tube tip overlies the mid to distal stomach. Gas pattern nonobstructed. Artifact over the abdomen and lower chest. IMPRESSION: Esophageal tube tip overlies the mid to distal stomach Electronically Signed   By: Donavan Foil M.D.   On: 01/13/2021 17:12   CT HEAD WO CONTRAST  Result Date: 01/15/2021 CLINICAL DATA:   Fever. EXAM: CT HEAD WITHOUT CONTRAST TECHNIQUE: Contiguous axial images were obtained from the base of the skull through the vertex without intravenous contrast. COMPARISON:  None. FINDINGS: Brain: Patient had difficulty tolerating the exam there is moderate motion artifact allowing for motion, no intracranial hemorrhage. No mass effect or midline shift. No hydrocephalus. The basilar cisterns are patent. No evidence of territorial infarct or acute ischemia. No extra-axial or intracranial fluid collection. Vascular: No hyperdense vessel. Mild carotid skull base atherosclerosis. Skull: No fracture or focal lesion. Sinuses/Orbits: Postsurgical change of the right globe. No acute findings. Other: None. IMPRESSION: Motion limited exam. No acute intracranial abnormality. Electronically Signed   By: Keith Rake M.D.   On: 01/15/2021 15:42   MR BRAIN W WO CONTRAST  Result Date: 01/21/2021 CLINICAL DATA:  Ongoing encephalopathy. Lymphocytic predominance on CSF. Viral meningitis. EXAM: MRI HEAD WITHOUT AND WITH CONTRAST TECHNIQUE: Multiplanar, multiecho pulse sequences of the brain and surrounding structures were obtained without and with intravenous contrast. CONTRAST:  4m GADAVIST GADOBUTROL 1 MMOL/ML IV SOLN COMPARISON:  CT head without contrast 01/15/2021 FINDINGS: Brain: Study is mildly degraded patient motion. Periventricular white matter changes are within normal limits for age. No acute  infarct, hemorrhage, or mass lesion is present. The ventricles are of normal size. No significant extraaxial fluid collection is present. The internal auditory canals are within normal limits. The brainstem and cerebellum are within normal limits. Postcontrast images demonstrate no pathologic enhancement. Vascular: Flow is present in the major intracranial arteries. Skull and upper cervical spine: Degenerative changes are noted at C3-4. Decreased T1 marrow signal is present throughout. No focal lesions are present.  Craniocervical junction is normal. Sinuses/Orbits: Banding noted on the right. Right lens replacement present. Globes and orbits are otherwise within normal limits. Paranasal sinuses are clear. There is some fluid in the mastoid air cells bilaterally. No obstructing nasopharyngeal lesion is present. IMPRESSION: 1. Normal MRI appearance of the brain for age. No acute or focal lesion to explain the patient's symptoms. 2. Minimal fluid in the mastoid air cells bilaterally. No obstructing nasopharyngeal lesion is present. Electronically Signed   By: San Morelle M.D.   On: 01/21/2021 14:58   MR PELVIS WO CONTRAST  Result Date: 01/15/2021 CLINICAL DATA:  Prostatitis, rule out prostate abscess. EXAM: MRI PELVIS WITHOUT CONTRAST TECHNIQUE: Multiplanar multisequence MR imaging of the pelvis was performed. No intravenous contrast was administered. Study limited by susceptibility artifact from RIGHT hip arthroplasty and due to gross patient motion on some images. COMPARISON:  CT of the abdomen and pelvis dated January 12, 2021. FINDINGS: Urinary Tract: Hyperintense material on T1 showing T2 hypointensity about the bladder base surrounding the patient's Foley catheter. Mildly patulous appearance of the distal ureters without frank dilation. Bowel: Visualized bowel is grossly unremarkable on very limited assessment. Vascular/Lymphatic: Vascular structures are normal caliber also with limited evaluation particularly due to lack of contrast administration. No gross adenopathy in the pelvis Reproductive: Prostate is enlarged with diffuse low signal on T2 weighted images and signs of BPH. No discrete fluid collection visualized on noncontrast images within the prostate gland. Prostate measures 7.1 x 6.5 x 6.7 (volume = 160 cc) cm. Foley catheter in the urinary bladder. Small bilateral hydroceles, testicles not fully evaluated. Other:  Signs of body wall edema.  Trace free fluid in the pelvis. Musculoskeletal: RIGHT hip  arthroplasty. No suspicious bone lesion on limited assessment. IMPRESSION: 1. No discrete fluid collection visualized on noncontrast images within the prostate gland. Marked prostatomegaly with diffuse low signal throughout the gland with changes of BPH, nonspecific and could certainly be seen in the setting of marked prostatitis. Consider PSA correlation and follow-up as warranted. 2. Decreased blood products in the urinary bladder compared to recent CT evaluation. 3. Trace free fluid in the pelvis. Electronically Signed   By: Zetta Bills M.D.   On: 01/15/2021 12:23   CT ABDOMEN PELVIS W CONTRAST  Result Date: 01/12/2021 CLINICAL DATA:  Abdominal pain. EXAM: CT ABDOMEN AND PELVIS WITH CONTRAST TECHNIQUE: Multidetector CT imaging of the abdomen and pelvis was performed using the standard protocol following bolus administration of intravenous contrast. CONTRAST:  163m OMNIPAQUE IOHEXOL 300 MG/ML  SOLN COMPARISON:  01/08/2021 FINDINGS: Lower chest: Subsegmental atelectasis and dependent change noted in the right lung base. Hepatobiliary: No focal liver abnormality. The gallbladder is normal. No signs of bile duct dilatation Pancreas: Unremarkable. No pancreatic ductal dilatation or surrounding inflammatory changes. Spleen: Normal in size without focal abnormality. Adrenals/Urinary Tract: Normal adrenal glands. No kidney mass identified bilaterally. Bilateral pelviectasis is new from previous exam there is mild bilateral hydroureter. Mild bladder distension. Streak artifact from right hip arthroplasty device diminishes evaluation of the bladder. Similar to the previous exam is  a heterogeneous, hyperdense mass within the bladder measuring 6.8 x 5.8 by 4.8 cm. Stomach/Bowel: Small hiatal hernia. Stomach is nondistended the appendix is not confidently identified no secondary signs of acute appendicitis no bowel wall thickening, inflammation, or distension. Vascular/Lymphatic: Aortic atherosclerosis. No  retroperitoneal or mesenteric adenopathy multiple prominent, non pathologically enlarged pelvic lymph nodes are identified similar to previous exam for example, right external iliac node measures 0.9 cm, image 68/2. Borderline enlarged bilateral inguinal lymph nodes are identified. Index left inguinal lymph node measures 1.3 cm, image 98/2. Index right inguinal node measures 1.2 cm, image 98/2. Reproductive: Prostate gland appears enlarged with mass effect upon the bladder base measuring approximate 6.9 x 5.6 x 7.4 cm (volume = 150 cm^3). Other: No free fluid or fluid collections. 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 Similar appearance of heterogeneous, hyperdense mass within the bladder which may represent a large blood clot or neoplasm. Evaluation the bladder is diminished secondary to beam hardening artifact from patient's right hip arthroplasty device. Bladder ultrasound may be helpful in the acute setting to assess the nature of this mass. 2. Bilateral pelviectasis and hydroureter. 3. Marked prostate gland enlargement with mass effect upon the bladder base. 4. 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 5. Aortic atherosclerosis. Aortic Atherosclerosis (ICD10-I70.0). Electronically Signed   By: Kerby Moors M.D.   On: 01/12/2021 15:55   CT CHEST ABDOMEN PELVIS W CONTRAST  Result Date: 01/15/2021 CLINICAL DATA:  Persistent fevers.  Evaluate for infectious source. EXAM: CT CHEST, ABDOMEN, AND PELVIS WITH CONTRAST TECHNIQUE: Multidetector CT imaging of the chest, abdomen and pelvis was performed following the standard protocol during bolus administration of intravenous contrast. CONTRAST:  134m OMNIPAQUE IOHEXOL 300 MG/ML  SOLN COMPARISON:  MRI pelvis 01/15/2021 and CT scan 01/12/2021 FINDINGS: CT CHEST FINDINGS Cardiovascular: The heart is  normal in size. No pericardial effusion. The aorta is normal in caliber. No dissection. Scattered coronary artery calcifications. Mediastinum/Nodes: No mediastinal or hilar mass or lymphadenopathy. There is an NG tube coursing down the esophagus and into the stomach. Lungs/Pleura: Small to moderate-sized bilateral pleural effusions with overlying atelectasis. No pulmonary infiltrates or pulmonary edema. No worrisome pulmonary lesions. Few small perifissural nodules are likely benign lymph nodes. Musculoskeletal: No significant bony findings. CT ABDOMEN PELVIS FINDINGS Hepatobiliary: No hepatic lesions or intrahepatic biliary dilatation. The gallbladder is grossly normal. No common bile duct dilatation. Pancreas: No mass, inflammation or ductal dilatation. Spleen: Normal size.  No focal lesions. Adrenals/Urinary Tract: The adrenal glands and kidneys are unremarkable. No renal lesions or obstructing ureteral calculi. No bladder mass is identified. There is a Foley catheter in the bladder and some residual hematoma. Stomach/Bowel: The stomach, duodenum, small bowel and colon are unremarkable. No acute inflammatory changes, mass lesions or obstructive findings. Vascular/Lymphatic: Stable aortic and iliac artery calcifications. No aneurysm. The major venous structures are patent. No mesenteric or retroperitoneal mass or adenopathy. Small scattered lymph nodes are stable. Reproductive: Significant artifact from the right hip prosthesis. The prostate gland is markedly enlarged but no obvious abscess. Other: Small amount of free abdominal and free pelvic fluid is noted but no rim enhancing abscess. Body wall edema is noted mainly in the flank areas. 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 IMPRESSION: 1. Small to moderate-sized bilateral pleural  effusions with overlying atelectasis. 2. Small amount of free abdominal  and free pelvic fluid but no rim enhancing abscess. 3. Markedly enlarged prostate gland but no obvious abscess. 4. Foley catheter in the bladder with some residual hematoma. Aortic Atherosclerosis (ICD10-I70.0).  Foot Electronically Signed   By: Marijo Sanes M.D.   On: 01/15/2021 15:53   IR Fluoro Guide Ndl Plmt / BX  Result Date: 01/16/2021 CLINICAL DATA:  Mental status change EXAM: DIAGNOSTIC LUMBAR PUNCTURE UNDER FLUOROSCOPIC GUIDANCE FLUOROSCOPY TIME:  6 seconds; 1 mGy PROCEDURE: Informed consent was obtained from the family prior to the procedure, including potential complications of headache, allergy, and pain. Intravenous Fentanyl 84mg, Benadryl 25 mg, and Versed '2mg'$  were administered as conscious sedation during continuous monitoring of the patient's level of consciousness and physiological / cardiorespiratory status by the radiology RN, with a total moderate sedation time of 10 minutes. With the patient prone, the lower back was prepped with Betadine. 1% Lidocaine was used for local anesthesia. Lumbar puncture was performed at the L4 level from a left parasagittal approach using a 20 gauge needle with return of pale yellow tinged CSF. 10 ml of CSF were obtained for laboratory studies, divided among for sequentially numbered vials. The patient tolerated the procedure well and there were no apparent complications. IMPRESSION: 1. Technically successful lumbar puncture under fluoroscopy Electronically Signed   By: DLucrezia EuropeM.D.   On: 01/16/2021 17:05   DG CHEST PORT 1 VIEW  Result Date: 01/17/2021 CLINICAL DATA:  History of diabetes and known bladder mass EXAM: PORTABLE CHEST 1 VIEW COMPARISON:  01/15/2021 FINDINGS: Cardiac shadow is stable. Feeding catheter extends into the stomach. Right-sided PICC line is noted the cavoatrial junction stable from the prior exam. Bilateral pleural effusions are again seen. Left retrocardiac consolidation is noted as well. No significant vascular congestion is seen.  IMPRESSION: Bilateral effusions and left retrocardiac opacity. Tubes and lines as described. Electronically Signed   By: MInez CatalinaM.D.   On: 01/17/2021 21:13   DG Chest Port 1 View  Result Date: 01/15/2021 CLINICAL DATA:  PICC line placement EXAM: PORTABLE CHEST 1 VIEW COMPARISON:  01/14/2021, CT 01/15/2021 FINDINGS: Esophageal tube tip extends below diaphragm but is incompletely visualized. Right upper extremity central venous catheter tip faintly visible over the SVC. Bilateral pleural effusions. Slight worsening of consolidation at left lung base. Cardiomegaly with vascular congestion. IMPRESSION: 1. Right upper extremity central venous catheter tip overlies the SVC. 2. Cardiomegaly with vascular congestion. Bilateral pleural effusions with slight worsening of consolidation at left lung base Electronically Signed   By: KDonavan FoilM.D.   On: 01/15/2021 18:51   DG CHEST PORT 1 VIEW  Result Date: 01/14/2021 CLINICAL DATA:  Fever.  Aspiration. EXAM: PORTABLE CHEST 1 VIEW COMPARISON:  01/12/2021. FINDINGS: Feeding tube noted with tip below hemidiaphragms. Cardiomegaly. No pulmonary venous congestion. Bibasilar pulmonary infiltrates/edema. Small left pleural effusion cannot be excluded. No pneumothorax. No acute bony abnormality identified. IMPRESSION: 1.  Feeding tube noted with tip below left hemidiaphragm. 2.  Cardiomegaly.  No pulmonary venous congestion. 3. Bibasilar pulmonary infiltrates/edema. Aspiration cannot be excluded. Small left pleural effusion cannot be excluded. Electronically Signed   By: TMarcello Moores Register   On: 01/14/2021 06:57   DG Chest Port 1 View  Result Date: 01/12/2021 CLINICAL DATA:  Questionable sepsis. EXAM: PORTABLE CHEST 1 VIEW COMPARISON:  Abdominal CT 12/12/2020 FINDINGS: Portable views of the chest were obtained. Few densities at the left lung base and there were small densities in  this area on the previous CT topogram. Otherwise, the lungs are clear. Negative for a  pneumothorax. Heart and mediastinum are within normal limits. Trachea is midline. IMPRESSION: 1. No acute cardiopulmonary disease. 2. Left basilar densities are suggestive for atelectasis or scarring. Electronically Signed   By: Markus Daft M.D.   On: 01/12/2021 13:47   [place] EKG SITE RITE  Result Date: 01/15/2021 If Site Rite image not attached, placement could not be confirmed due to current cardiac rhythm.   DISCHARGE EXAMINATION: See progress note from earlier today  DISPOSITION: [facility]   Current Inpatient Medications: Scheduled:  Chlorhexidine Gluconate Cloth  6 each Topical Daily   feeding supplement (GLUCERNA SHAKE)  237 mL Oral TID BM   finasteride  5 mg Oral Daily   gabapentin  600 mg Oral BID   lidocaine  1 patch Transdermal Q24H   metFORMIN  500 mg Oral BID WC   tamsulosin  0.4 mg Oral Daily   zolpidem  5 mg Oral QHS   Continuous: [id number], lip balm, ondansetron, polyvinyl alcohol, traMADol     TOTAL DISCHARGE TIME: 35 minutes  Breslin Hemann [employer] on [email or web address]  01/30/2021, 2:06 PM

## 2021-01-31 DIAGNOSIS — R5381 Other malaise: Secondary | ICD-10-CM | POA: Diagnosis not present

## 2021-01-31 LAB — CBC
HCT: 31.5 % — ABNORMAL LOW (ref 39.0–52.0)
Hemoglobin: 10.4 g/dL — ABNORMAL LOW (ref 13.0–17.0)
MCH: 30.6 pg (ref 26.0–34.0)
MCHC: 33 g/dL (ref 30.0–36.0)
MCV: 92.6 fL (ref 80.0–100.0)
Platelets: 339 10*3/uL (ref 150–400)
RBC: 3.4 MIL/uL — ABNORMAL LOW (ref 4.22–5.81)
RDW: 15.3 % (ref 11.5–15.5)
WBC: 8 10*3/uL (ref 4.0–10.5)
nRBC: 0 % (ref 0.0–0.2)

## 2021-01-31 LAB — GLUCOSE, CAPILLARY
Glucose-Capillary: 133 mg/dL — ABNORMAL HIGH (ref 70–99)
Glucose-Capillary: 138 mg/dL — ABNORMAL HIGH (ref 70–99)

## 2021-01-31 MED ORDER — CHLORHEXIDINE GLUCONATE CLOTH 2 % EX PADS
6.0000 | MEDICATED_PAD | Freq: Every day | CUTANEOUS | Status: DC
  Administered 2021-01-31 – 2021-02-03 (×4): 6 via TOPICAL

## 2021-01-31 NOTE — Plan of Care (Signed)
  Problem: RH Problem Solving Goal: LTG Patient will demonstrate problem solving for (SLP) Description: LTG:  Patient will demonstrate problem solving for basic/complex daily situations with cues  (SLP) Flowsheets (Taken 01/31/2021 1626) LTG: Patient will demonstrate problem solving for (SLP):  Basic daily situations  Complex daily situations LTG Patient will demonstrate problem solving for: Supervision   Problem: RH Memory Goal: LTG Patient will demonstrate ability for day to day (SLP) Description: LTG:   Patient will demonstrate ability for day to day recall/carryover during cognitive/linguistic activities with assist  (SLP) Flowsheets (Taken 01/31/2021 1626) LTG: Patient will demonstrate ability for day to day recall:  New information  Daily complex information LTG: Patient will demonstrate ability for day to day recall/carryover during cognitive/linguistic activities with assist (SLP): Supervision Goal: LTG Patient will use memory compensatory aids to (SLP) Description: LTG:  Patient will use memory compensatory aids to recall biographical/new, daily complex information with cues (SLP) Flowsheets (Taken 01/31/2021 1626) LTG: Patient will use memory compensatory aids to (SLP): Supervision   Problem: RH Attention Goal: LTG Patient will demonstrate this level of attention during functional activites (SLP) Description: LTG:  Patient will will demonstrate this level of attention during functional activites (SLP) Flowsheets (Taken 01/31/2021 1626) Patient will demonstrate during cognitive/linguistic activities the attention type of: Selective Patient will demonstrate this level of attention during cognitive/linguistic activities in: Controlled LTG: Patient will demonstrate this level of attention during cognitive/linguistic activities with assistance of (SLP): Supervision

## 2021-01-31 NOTE — Evaluation (Signed)
Speech Language Pathology Assessment and Plan  Patient Details  Name: Joseph Banks MRN: [id number] Date of Birth: 06/13/1946  SLP Diagnosis: Cognitive Impairments  Rehab Potential: Excellent ELOS: 7-10    Today's Date: 01/31/2021 SLP Individual Time: 9449-6759 SLP Individual Time Calculation (min): 60 min   Hospital Problem: Principal Problem:   Debility Active Problems:   Sepsis ([place])  Past Medical History:  Past Medical History:  Diagnosis Date   BPH (benign prostatic hyperplasia)    Diabetes mellitus without complication ([place])    Past Surgical History:  Past Surgical History:  Procedure Laterality Date   IR FLUORO GUIDED NEEDLE PLC ASPIRATION/INJECTION LOC  01/16/2021   TRANSURETHRAL RESECTION OF PROSTATE      Assessment / Plan / Recommendation Clinical Impression Joseph Banks is a 75 year old right-handed male with history of diabetes mellitus as well as BPH and history of tobacco use.  Presented 01/12/2021 with nonspecific perianal pain as well as low-grade fever. He had recently seen urology services December 25, 2020 diagnosed with prostatitis and epididymitis was given 1 shot of ceftriaxone and placed on Bactrim. At that time patient had CT scan also placed on Cipro which patient had been taking for at least 4 days despite which patient's pain continued to worsen and fever up to 104 degrees. In the ED he was tachycardic. 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 Junious Silk. A Foley catheter tube remained in place completing course of antibiotic therapy.  Underwent lumbar puncture for altered mental status HSV and enterovirus PCR negative he had initially been on acyclovir discontinued.  Meningitis encephalitis panel negative.  MRI 7/13 of the brain did not show any acute findings.  Hospital course complicated by newly diagnosed transient atrial fibrillation with RVR cardiology service follow-up felt to be  precipitated by sepsis.  Cardiac rate remained controlled and monitored.  Nonspecific back pain placed on Lidoderm patch as well as use of tramadol.  In regards to transaminitis likely shock liver LFTs continue to normalize and monitor.  He did have some dysphagia diet slowly advance to mechanical soft thin liquids. Therapy evaluations completed due to patient's decreased functional ability altered mental status was admitted for a comprehensive rehab program.  Patient presents with mild cognitive-linguistic deficits characterized by decreased short-term recall, information processing, attention, and executive functions. Patient completed subtests of Cognistat revealing scores within average range in tested areas including orientation (12/12), attention (8/8), memory registration (2/2), calculations (4/4), and similarities (8/8), and judgement (6/6). Incomplete points obtained on short-term recall with word list (10/12) and mild impairment in constructional ability (3/6). Patient required increased processing time and trial and error during task constructional ability task. He demonstrated effective self monitoring and self correction. Patient participated in the [place] Mental Status (SLUMS) exam on 01/29/21 with score of 18/30 with increased difficulty with clock drawing, mental manipulation, mental math (no difficulty on mental math during today's assessment), and attention to detailed information per documenting SLP. Current deficits are a significant change from baseline per patient and spouse. Patient displays intact insight into cognitive deficits. Patient's auditory comprehension and verbal expression appeared grossly intact. Motor speech production was clear without indication or concerns for dysarthria. Patient is tolerating a regular diet with thin liquids and pills whole and one-at-a-time with thin liquid. Per patient report, patient was independent at [facility] with all ADLs. Patient would  benefit from skilled SLP intervention to maximize cognitive-linguistic function and functional independence prior to  discharge. Anticipate patient will require at least intermittent supervision at discharge and may benefit from outpatient SLP intervention.     Skilled Therapeutic Interventions          Administered subtests of Cognistat and further informal cognitive-linguistic assessment. Educated patient and spouse on results and plan of care. Both verbalized agreement with plan.   SLP Assessment  Patient will need skilled Speech Lanaguage Pathology Services during CIR admission    Recommendations  SLP Diet Recommendations: Age appropriate regular solids;Thin Liquid Administration via: Cup;Straw Medication Administration: Whole meds with liquid Supervision: Patient able to self feed Postural Changes and/or Swallow Maneuvers: Seated upright 90 degrees;Upright 30-60 min after meal Oral Care Recommendations: Oral care BID Patient destination: Home Follow up Recommendations: Outpatient SLP Equipment Recommended: None recommended by SLP    SLP Frequency 3 to 5 out of 7 days   SLP Duration  SLP Intensity  SLP Treatment/Interventions 7-10  Minumum of 1-2 x/day, 30 to 90 minutes  Cognitive remediation/compensation;Internal/external aids;Environmental controls;Cueing hierarchy;Functional tasks;Medication managment;Patient/family education    Pain Pain Assessment Pain Score: 3  Pain Type: Acute pain Pain Location: Back Pain Orientation: Mid Pain Descriptors / Indicators: Aching;Numbness Pain Onset: Gradual Pain Intervention(s): Repositioned;Ambulation/increased activity  Prior Functioning Cognitive/Linguistic Baseline: Within functional limits Type of Home: House  Lives With: Spouse Available Help at Discharge: Family;Available 24 hours/day Education: Arts development officer, math major Vocation: Retired  Programmer, systems Overall Cognitive Status: Impaired/Different from  baseline Arousal/Alertness: Awake/alert Orientation Level: Oriented X4 Attention: Selective Selective Attention: Impaired Selective Attention Impairment: Verbal complex Memory: Impaired Memory Impairment: Decreased recall of new information;Decreased short term memory Decreased Short Term Memory: Verbal basic;Functional basic Awareness: Impaired Awareness Impairment: Emergent impairment Problem Solving: Impaired Problem Solving Impairment: Verbal complex;Functional complex Executive Function: Organizing;Sequencing Sequencing: Impaired Sequencing Impairment: Chief Operating Officer: Impaired Organizing Impairment: Verbal complex Self Monitoring: Appears intact Self Monitoring Impairment: Verbal complex Self Correcting: Appears intact  Comprehension Auditory Comprehension Overall Auditory Comprehension: Appears within functional limits for tasks assessed Interfering Components: Attention;Working Lobbyist Expression Overall Verbal Expression: Appears within functional limits for tasks assessed Oral Motor Oral Motor/Sensory Function Overall Oral Motor/Sensory Function: Within functional limits Motor Speech Overall Motor Speech: Appears within functional limits for tasks assessed  Care Tool Care Tool Cognition Expression of Ideas and Wants Expression of Ideas and Wants: Some difficulty - exhibits some difficulty with expressing needs and ideas (e.g, some words or finishing thoughts) or speech is not clear   Understanding Verbal and Non-Verbal Content Understanding Verbal and Non-Verbal Content: Usually understands - understands most conversations, but misses some part/intent of message. Requires cues at times to understand   Memory/Recall Ability *first 3 days only Memory/Recall Ability *first 3 days only: Current season;That he or she is in a hospital/hospital unit    Bedside Swallowing Assessment General Date of Onset: 01/13/21 Previous  Swallow Assessment: BSE 01/13/2021 with follow up SLP visits Diet Prior to this Study: Dysphagia 3 (soft);Thin liquids (last SLP note on  01/22/2021 indicating Dys 3, thin liquids) Behavior/Cognition: Alert;Cooperative;Pleasant mood Oral Cavity - Dentition: Adequate natural dentition Self-Feeding Abilities: Able to feed self Vision: Functional for self-feeding Patient Positioning: Upright in bed Volitional Cough: Strong   Short Term Goals: Week 1: SLP Short Term Goal 1 (Week 1): STG=LTG d/t ELOS  Refer to Care Plan for Long Term Goals  Recommendations for other services: Neuropsych - spouse reports increased anxiety  Discharge Criteria: Patient will be discharged from SLP if patient refuses treatment 3 consecutive times without medical reason,  if treatment goals not met, if there is a change in medical status, if patient makes no progress towards goals or if patient is discharged from hospital.  The above assessment, treatment plan, treatment alternatives and goals were discussed and mutually agreed upon: by patient  Patty Sermons 01/31/2021, 4:25 PM

## 2021-01-31 NOTE — Evaluation (Signed)
Occupational Therapy Assessment and Plan  Patient Details  Name: Joseph Banks MRN: [id number] Date of Birth: 1945/12/31  OT Diagnosis: abnormal posture, cognitive deficits, and muscle weakness (generalized) Rehab Potential: Rehab Potential (ACUTE ONLY): Good ELOS: 7-10   Today's Date: 01/31/2021 OT Individual Time: 0800-0900 OT Individual Time Calculation (min): 60 min     Hospital Problem: Principal Problem:   Debility Active Problems:   Sepsis ([place])   Past Medical History:  Past Medical History:  Diagnosis Date   BPH (benign prostatic hyperplasia)    Diabetes mellitus without complication ([place])    Past Surgical History:  Past Surgical History:  Procedure Laterality Date   IR FLUORO GUIDED NEEDLE PLC ASPIRATION/INJECTION LOC  01/16/2021   TRANSURETHRAL RESECTION OF PROSTATE      Assessment & Plan Clinical Impression: 75yo with a history of DM who had recently been diagnosed with prostatitis treated with ceftriaxone and bactrim but continued to experience worsening perianal pain with fever as high as 104. He was therefore changed to ciprofloxacin but symptoms persisted. CT at time of repeat presentation suggested a persistent bladder mass and enlarging prostate.     Significant Events:  Since admission the patient has been treated with antibiotic under the direction of the ID service.  His stay has been complicated by the development of new atrial fibrillation with RVR requiring medication titration.  He remained encephalopathic for an extended period of time before slowly regaining his cognition.  6/16 seen by Urology - UTI sx - rocephin IM + bactrim po  6/30 [facility] ED worsening pain/fever - CT abdom - cipro po  7/4 admit via [facility] ED > [facility] - persisting fevers/pain  7/4 transfer to ICU due to sepsis - encephalopathy  7/7 PICC line placement  7/8 LP under conscious sedation  7/13 MRI brain - no acute findings   Patient currently requires min with basic self-care skills secondary to  muscle weakness, decreased cardiorespiratoy endurance, decreased awareness, decreased problem solving, decreased safety awareness, and decreased memory, and decreased standing balance, decreased postural control, and decreased balance strategies.  Prior to hospitalization, patient could complete BADL/IADL with independent .  Patient will benefit from skilled intervention to decrease level of assist with basic self-care skills and increase level of independence with iADL prior to discharge home with care partner.  Anticipate patient will require 24 hour supervision and follow up outpatient.  OT - End of Session Activity Tolerance: Tolerates 30+ min activity with multiple rests Endurance Deficit: Yes OT Assessment Rehab Potential (ACUTE ONLY): Good OT Barriers to Discharge: Inaccessible home environment;Decreased caregiver support OT Patient demonstrates impairments in the following area(s): Balance;Cognition;Endurance;Motor;Pain;Perception;Safety OT Basic ADL's Functional Problem(s): Grooming;Bathing;Dressing;Toileting OT Transfers Functional Problem(s): Toilet;Tub/Shower OT Plan OT Intensity: Minimum of 1-2 x/day, 45 to 90 minutes OT Frequency: 5 out of 7 days OT Duration/Estimated Length of Stay: 7-10 OT Treatment/Interventions: Balance/vestibular training;Pain management;Self Care/advanced ADL retraining;Therapeutic Activities;UE/LE Coordination activities;Visual/perceptual remediation/compensation;Therapeutic Exercise;Skin care/wound managment;Patient/family education;Functional mobility training;Disease mangement/prevention;Cognitive remediation/compensation;Community reintegration;Discharge planning;DME/adaptive equipment instruction;Neuromuscular re-education;Psychosocial support;Splinting/orthotics;UE/LE Strength taining/ROM;Wheelchair propulsion/positioning OT Basic Self-Care Anticipated Outcome(s): MOD I OT Toileting Anticipated Outcome(s): MOD I toileting, S bathing OT Bathroom  Transfers Anticipated Outcome(s): MOD I toileting, S bathing OT Recommendation Patient destination: Home Follow Up Recommendations: Outpatient OT   OT Evaluation Precautions/Restrictions  Precautions Precautions: Fall Restrictions Weight Bearing Restrictions: No General Chart Reviewed: Yes Family/Caregiver Present: No Vital Signs   Pain Pain Assessment Pain Scale: 0-10 Pain Score: 0-No pain Home Living/Prior Functioning Home Living Family/patient expects to be discharged to::  Private residence Living Arrangements: Spouse/significant other Available Help at Discharge: Family, Available 24 hours/day Type of Home: House Home Access: Stairs to enter Technical brewer of Steps: 2 Entrance Stairs-Rails: None Home Layout: One level Bathroom Shower/Tub: Estate agent Accessibility: Yes Additional Comments: home verified by wife IADL History Education: Arts development officer, math major Prior Function Level of Independence: Independent with basic ADLs, Independent with homemaking with ambulation Vocation: Retired Comments: per family patient recently back to [place] area from [place], appears active at baseline Vision Baseline Vision/History: Wears glasses Wears Glasses: Distance only Patient Visual Report: No change from baseline Vision Assessment?: No apparent visual deficits Perception  Perception: Within Functional Limits Praxis Praxis: Intact Cognition Overall Cognitive Status: Impaired/Different from baseline Arousal/Alertness: Awake/alert Orientation Level: Person;Place;Situation Person: Oriented Place: Oriented Situation: Oriented Year: 2022 Month: July Day of Week: Correct Immediate Memory Recall: Sock;Blue;Bed Memory Recall Sock: Without Cue Memory Recall Blue: Without Cue Memory Recall Bed: Without Cue Awareness: Impaired Awareness Impairment: Intellectual impairment Problem Solving: Impaired Self Monitoring:  Impaired Self Correcting: Impaired Sensation Sensation Light Touch: Appears Intact Coordination Gross Motor Movements are Fluid and Coordinated: Yes Fine Motor Movements are Fluid and Coordinated: No Finger Nose Finger Test: minimally slower than normal Motor  Motor Motor: Within Functional Limits  Trunk/Postural Assessment  Cervical Assessment Cervical Assessment: Exceptions to [facility] (head forward) Thoracic Assessment Thoracic Assessment: Exceptions to [facility] (mild kyphosis) Lumbar Assessment Lumbar Assessment: Exceptions to [facility] (post pelvic tilt- LBP at baseline) Postural Control Postural Control: Deficits on evaluation  Balance Balance Balance Assessed: Yes Dynamic Sitting Balance Dynamic Sitting - Level of Assistance: 5: Stand by assistance Dynamic Standing Balance Dynamic Standing - Level of Assistance: 4: Min assist Extremity/Trunk Assessment RUE Assessment RUE Assessment: Within Functional Limits LUE Assessment LUE Assessment: Within Functional Limits  Care Tool Care Tool Self Care Eating        Oral Care    Oral Care Assist Level: Supervision/Verbal cueing    Bathing   Body parts bathed by patient: Right arm;Left arm;Chest;Abdomen;Front perineal area;Buttocks;Right upper leg;Left upper leg;Face Body parts bathed by helper: Right lower leg;Left lower leg   Assist Level: Minimal Assistance - Patient > 75%    Upper Body Dressing(including orthotics)   What is the patient wearing?: Pull over shirt   Assist Level: Set up assist    Lower Body Dressing (excluding footwear)   What is the patient wearing?: Pants Assist for lower body dressing: Minimal Assistance - Patient > 75%    Putting on/Taking off footwear   What is the patient wearing?: Ted hose;Non-skid slipper socks Assist for footwear: Moderate Assistance - Patient 50 - 74%       Care Tool Toileting Toileting activity   Assist for toileting: Minimal Assistance - Patient > 75%     Care Tool Bed  Mobility Roll left and right activity        Sit to lying activity        Lying to sitting edge of bed activity         Care Tool Transfers Sit to stand transfer        Chair/bed transfer   Chair/bed transfer assist level: Minimal Assistance - Patient > 75%     Toilet transfer   Assist Level: Minimal Assistance - Patient > 75%     Care Tool Cognition Expression of Ideas and Wants     Understanding Verbal and Non-Verbal Content     Memory/Recall Ability *first 3 days only  Refer to Care Plan for Long Term Goals  SHORT TERM GOAL WEEK 1 OT Short Term Goal 1 (Week 1): STG=LTG d/t ELOS  Recommendations for other services: Therapeutic Recreation  Pet therapy, Kitchen group, and Outing/community reintegration   Skilled Therapeutic Intervention 1:1. Pt received in recliner, agreeable ot OT after edu re OT role/purpose, CIR, ELOS and POC. Pt completes all mobility and ADLs as stated below with RW and MIN A for standing balance during ADLs. Pt demo decreased endurance throughout sesssion needing short rest breaks between tasks and demo increased energy exertion reaching towards floor for footwear. Overall pt very motivated to improve and able to verbalize deficits. Exited session with pt seated in bed, exit alarm on and call light in reach   ADL ADL Grooming: Supervision/safety Where Assessed-Grooming: Chair Upper Body Bathing: Setup Where Assessed-Upper Body Bathing: Shower Lower Body Bathing: Minimal assistance Where Assessed-Lower Body Bathing: Shower Upper Body Dressing: Setup Where Assessed-Upper Body Dressing: Chair Lower Body Dressing: Minimal assistance Where Assessed-Lower Body Dressing: Chair Toileting: Minimal assistance Where Assessed-Toileting: Bedside Commode Toilet Transfer: Minimal assistance Science writer: Geophysical data processor Method: Ambulating Mobility  Transfers Sit to Stand: Minimal Assistance - Patient >  75% Stand to Sit: Minimal Assistance - Patient > 75%   Discharge Criteria: Patient will be discharged from OT if patient refuses treatment 3 consecutive times without medical reason, if treatment goals not met, if there is a change in medical status, if patient makes no progress towards goals or if patient is discharged from hospital.  The above assessment, treatment plan, treatment alternatives and goals were discussed and mutually agreed upon: by patient and by family  Tonny Branch 01/31/2021, 9:02 AM

## 2021-01-31 NOTE — Plan of Care (Signed)
  Problem: RH Balance Goal: LTG Patient will maintain dynamic sitting balance (PT) Description: LTG:  Patient will maintain dynamic sitting balance with assistance during mobility activities (PT) Flowsheets (Taken 01/31/2021 1655) LTG: Pt will maintain dynamic sitting balance during mobility activities with:: Independent Goal: LTG Patient will maintain dynamic standing balance (PT) Description: LTG:  Patient will maintain dynamic standing balance with assistance during mobility activities (PT) Flowsheets (Taken 01/31/2021 1655) LTG: Pt will maintain dynamic standing balance during mobility activities with:: Supervision/Verbal cueing   Problem: Sit to Stand Goal: LTG:  Patient will perform sit to stand with assistance level (PT) Description: LTG:  Patient will perform sit to stand with assistance level (PT) Flowsheets (Taken 01/31/2021 1655) LTG: PT will perform sit to stand in preparation for functional mobility with assistance level: Independent with assistive device   Problem: RH Bed Mobility Goal: LTG Patient will perform bed mobility with assist (PT) Description: LTG: Patient will perform bed mobility with assistance, with/without cues (PT). Flowsheets (Taken 01/31/2021 1655) LTG: Pt will perform bed mobility with assistance level of: Independent   Problem: RH Bed to Chair Transfers Goal: LTG Patient will perform bed/chair transfers w/assist (PT) Description: LTG: Patient will perform bed to chair transfers with assistance (PT). Flowsheets (Taken 01/31/2021 1655) LTG: Pt will perform Bed to Chair Transfers with assistance level: Independent with assistive device    Problem: RH Car Transfers Goal: LTG Patient will perform car transfers with assist (PT) Description: LTG: Patient will perform car transfers with assistance (PT). Flowsheets (Taken 01/31/2021 1655) LTG: Pt will perform car transfers with assist:: Supervision/Verbal cueing   Problem: RH Furniture Transfers Goal: LTG Patient  will perform furniture transfers w/assist (OT/PT) Description: LTG: Patient will perform furniture transfers  with assistance (OT/PT). Flowsheets (Taken 01/31/2021 1655) LTG: Pt will perform furniture transfers with assist:: Independent with assistive device    Problem: RH Ambulation Goal: LTG Patient will ambulate in controlled environment (PT) Description: LTG: Patient will ambulate in a controlled environment, # of feet with assistance (PT). Flowsheets (Taken 01/31/2021 1655) LTG: Pt will ambulate in controlled environ  assist needed:: Supervision/Verbal cueing LTG: Ambulation distance in controlled environment: 200+' w/ LRAD Goal: LTG Patient will ambulate in home environment (PT) Description: LTG: Patient will ambulate in home environment, # of feet with assistance (PT). Flowsheets (Taken 01/31/2021 1655) LTG: Pt will ambulate in home environ  assist needed:: Supervision/Verbal cueing LTG: Ambulation distance in home environment: 69' w/ LRAD Goal: LTG Patient will ambulate in community environment (PT) Description: LTG: Patient will ambulate in community environment, # of feet with assistance (PT). Flowsheets (Taken 01/31/2021 1655) LTG: Pt will ambulate in community environ  assist needed:: Supervision/Verbal cueing LTG: Ambulation distance in community environment: 200+' w/ LRAD   Problem: RH Stairs Goal: LTG Patient will ambulate up and down stairs w/assist (PT) Description: LTG: Patient will ambulate up and down # of stairs with assistance (PT) Flowsheets (Taken 01/31/2021 1655) LTG: Pt will ambulate up/down stairs assist needed:: Supervision/Verbal cueing LTG: Pt will  ambulate up and down number of stairs: >/= 4 for home entry with 1 rail  Magda Kiel, PT

## 2021-01-31 NOTE — Progress Notes (Signed)
Mild blood noted in foley bag. Patient does not complain of pain or pressure in the bladder. Foley is in place, balloon inflated (10cc), and statlock in place. MD called, aware of patient's condition. No further complaints of discomfort. Call bell in reach.

## 2021-01-31 NOTE — Evaluation (Signed)
Physical Therapy Assessment and Plan  Patient Details  Name: Joseph Banks MRN: [id number] Date of Birth: 1946-01-24  PT Diagnosis: Abnormal posture, Abnormality of gait, and Muscle weakness Rehab Potential: Good ELOS: 5-7 days   Today's Date: 01/31/2021 PT Individual Time: 3532-9924 PT Individual Time Calculation (min): 76 min    Hospital Problem: Principal Problem:   Debility Active Problems:   Sepsis ([place])   Past Medical History:  Past Medical History:  Diagnosis Date   BPH (benign prostatic hyperplasia)    Diabetes mellitus without complication ([address])    Past Surgical History:  Past Surgical History:  Procedure Laterality Date   IR FLUORO GUIDED NEEDLE PLC ASPIRATION/INJECTION LOC  01/16/2021   TRANSURETHRAL RESECTION OF PROSTATE      Assessment & Plan Clinical Impression: 75yo with a history of DM who had recently been diagnosed with prostatitis treated with ceftriaxone and bactrim but continued to experience worsening perianal pain with fever as high as 104. He was therefore changed to ciprofloxacin but symptoms persisted. CT at time of repeat presentation suggested a persistent bladder mass and enlarging prostate.     Significant Events:  Since admission the patient has been treated with antibiotic under the direction of the ID service.  His stay has been complicated by the development of new atrial fibrillation with RVR requiring medication titration.  He remained encephalopathic for an extended period of time before slowly regaining his cognition.  6/16 seen by Urology - UTI sx - rocephin IM + bactrim po  6/30 [facility] ED worsening pain/fever - CT abdom - cipro po  7/4 admit via [facility] ED > [facility] - persisting fevers/pain  7/4 transfer to ICU due to sepsis - encephalopathy  7/7 PICC line placement  7/8 LP under conscious sedation  7/13 MRI brain - no acute findingsPatient transferred to CIR on 01/30/2021 .   Patient currently requires min with mobility secondary to muscle weakness,  decreased attention, decreased awareness, decreased problem solving, decreased safety awareness, and decreased memory, and decreased standing balance, decreased postural control, and decreased balance strategies.  Prior to hospitalization, patient was independent  with mobility and lived with Spouse in a House home.  Home access is 2Stairs to enter.  Patient will benefit from skilled PT intervention to maximize safe functional mobility, minimize fall risk, and decrease caregiver burden for planned discharge home with 24 hour supervision.  Anticipate patient will benefit from follow up OP at discharge.  PT - End of Session Activity Tolerance: Tolerates 30+ min activity with multiple rests Endurance Deficit: Yes Endurance Deficit Description: fatigues with standing activity and gait HR 100 after standing activity, needs multiple rests PT Assessment Rehab Potential (ACUTE/IP ONLY): Good PT Barriers to Discharge: Other (comments) PT Barriers to Discharge Comments: foley PT Patient demonstrates impairments in the following area(s): Endurance;Balance;Motor;Safety PT Transfers Functional Problem(s): Bed Mobility;Bed to Chair;Car;Furniture PT Locomotion Functional Problem(s): Ambulation;Stairs PT Plan PT Intensity: Minimum of 1-2 x/day ,45 to 90 minutes PT Frequency: 5 out of 7 days PT Duration Estimated Length of Stay: 5-7 days PT Treatment/Interventions: Ambulation/gait training;Balance/vestibular training;Cognitive remediation/compensation;Functional mobility training;DME/adaptive equipment instruction;Neuromuscular re-education;Stair training;UE/LE Strength taining/ROM;UE/LE Coordination activities;Therapeutic Exercise;Patient/family education;Wheelchair propulsion/positioning;Therapeutic Activities;Functional electrical stimulation;Discharge planning;Disease management/prevention PT Transfers Anticipated Outcome(s): mod I PT Locomotion Anticipated Outcome(s): S with LRAD PT Recommendation Follow  Up Recommendations: Outpatient PT Patient destination: Home Equipment Recommended: None recommended by PT   PT Evaluation Precautions/Restrictions Precautions Precautions: Fall General   Vital Signs  Pain Pain Assessment Pain Score: 2  Faces Pain Scale:  Hurts a little bit Pain Type: Acute pain Pain Location: Back Pain Orientation: Mid Pain Descriptors / Indicators: Aching;Numbness Pain Onset: Gradual Pain Intervention(s): Repositioned;Ambulation/increased activity Home Living/Prior Functioning Home Living Available Help at Discharge: Family;Available 24 hours/day Type of Home: House Home Access: Stairs to enter [employer] of Steps: 2 Entrance Stairs-Rails: None (has grabbar) Home Layout: One level Bathroom Shower/Tub: Multimedia programmer: Standard (has comfort height in Special educational needs teacher) Bathroom Accessibility: Yes  Lives With: Spouse Prior Function Level of Independence: Independent with basic ADLs;Independent with homemaking with ambulation Vocation: Retired Leisure: Hobbies-yes (Comment) (played pickle ball 3 days a week) Comments: per family patient recently back to [place] area from [place], appears active at baseline Vision/Perception  Perception Perception: Within Functional Limits Praxis Praxis: Intact  Cognition Overall Cognitive Status: Impaired/Different from baseline Arousal/Alertness: Awake/alert Orientation Level: Oriented X4 Attention: Selective Selective Attention: Impaired Selective Attention Impairment: Verbal complex Memory: Impaired Memory Impairment: Decreased recall of new information;Decreased short term memory Decreased Short Term Memory: Verbal basic;Functional basic Awareness: Impaired Awareness Impairment: Emergent impairment Problem Solving: Impaired Problem Solving Impairment: Verbal complex;Functional complex Executive Function: Organizing;Sequencing Sequencing: Impaired Sequencing Impairment: Nurse, mental health: Impaired Organizing Impairment: Verbal complex Self Monitoring: Appears intact Self Monitoring Impairment: Verbal complex Self Correcting: Appears intact Sensation Sensation Light Touch: Impaired by gross assessment Hot/Cold: Not tested Proprioception: Not tested Stereognosis: Not tested Additional Comments: reports LE's "numb" and has h/o neuropathy Coordination Gross Motor Movements are Fluid and Coordinated: No Coordination and Movement Description: limited due to generalized weakness Motor  Motor Motor: Within Functional Limits Motor - Skilled Clinical Observations: generalized weakness   Trunk/Postural Assessment  Cervical Assessment Cervical Assessment: Exceptions to [facility] Thoracic Assessment Thoracic Assessment: Exceptions to [facility] (kyphosis) Lumbar Assessment Lumbar Assessment: Exceptions to [facility] (post pelvic tilt with back pain at baseline) Postural Control Postural Control: Deficits on evaluation Postural Limitations: kyphosis, flexed at hips and knees with prolonged bedrest and for balance strategy  Balance Balance Balance Assessed: Yes Dynamic Sitting Balance Dynamic Sitting - Level of Assistance: 5: Stand by assistance Dynamic Sitting - Balance Activities: Forward lean/weight shifting Dynamic Standing Balance Dynamic Standing - Level of Assistance: 4: Min assist Dynamic Standing - Balance Activities: Other (comment) Dynamic Standing - Comments: washing perineal area in standing at sink 1 UE support at least and min A Extremity Assessment      RLE Assessment RLE Assessment: Exceptions to [facility] Active Range of Motion (AROM) Comments: [facility] General Strength Comments: hip flexion 4-/5, knee extenstion 4/5, flexion 4/5, ankle DF4-/5 LLE Assessment LLE Assessment: Exceptions to [facility] Active Range of Motion (AROM) Comments: [facility] General Strength Comments: hip flexion 4-/5, knee extenstion 4/5, flexion 4/5, ankle DF4-/5  Care Tool Care Tool Bed  Mobility Roll left and right activity   Roll left and right assist level: Supervision/Verbal cueing    Sit to lying activity   Sit to lying assist level: Minimal Assistance - Patient > 75%    Lying to sitting edge of bed activity   Lying to sitting edge of bed assist level: Contact Guard/Touching assist     Care Tool Transfers Sit to stand transfer   Sit to stand assist level: Minimal Assistance - Patient > 75%    Chair/bed transfer   Chair/bed transfer assist level: Minimal Assistance - Patient > 75%     Physiological scientist transfer assist level: Minimal Assistance - Patient > 75%      Care Tool  Locomotion Ambulation   Assist level: Minimal Assistance - Patient > 75% Assistive device: Walker-rolling Max distance: 220'  Walk 10 feet activity   Assist level: Minimal Assistance - Patient > 75% Assistive device: Walker-rolling   Walk 50 feet with 2 turns activity   Assist level: Minimal Assistance - Patient > 75% Assistive device: Walker-rolling  Walk 150 feet activity   Assist level: Minimal Assistance - Patient > 75% Assistive device: Walker-rolling  Walk 10 feet on uneven surfaces activity   Assist level: Minimal Assistance - Patient > 75% Assistive device: Walker-rolling  Stairs Stair activity did not occur: Safety/medical concerns        Walk up/down 1 step activity Walk up/down 1 step or curb (drop down) activity did not occur: Safety/medical concerns     Walk up/down 4 steps activity did not occuR: Safety/medical concerns  Walk up/down 4 steps activity      Walk up/down 12 steps activity Walk up/down 12 steps activity did not occur: Safety/medical concerns      Pick up small objects from floor Pick up small object from the floor (from standing position) activity did not occur: Safety/medical concerns      Wheelchair Will patient use wheelchair at discharge?: No Type of Wheelchair: Manual   Wheelchair assist level:  Supervision/Verbal cueing Max wheelchair distance: 150'  Wheel 50 feet with 2 turns activity   Assist Level: Supervision/Verbal cueing  Wheel 150 feet activity   Assist Level: Supervision/Verbal cueing    Refer to Care Plan for Long Term Goals  SHORT TERM GOAL WEEK 1 PT Short Term Goal 1 (Week 1): STG=LTG due to ELOS  Recommendations for other services: Therapeutic Recreation  Stress management  Skilled Therapeutic Intervention Patient in recliner in room with wife present.  Discussed evaluation procedures and pt and wife report he gets pain when sitting or lying too long.  Hopeful wife can get checked off to be able to help him up in the room.  Patient seated for strength assessment.  Patient performed sit to stand to RW with CGA from low recliner with cues for safety as rising too soon prior to environmental set up.  Patient ambulated with RW and min A x 220' down to elevator, down on elevator and to ortho gym with wife bringing w/c.  Patient seated EOM performed sit to supine with min A c/o increased back pain so educated for rolling to side.  Performed R sidelying to sit with CGA for technique.  Patient sit to stand to RW and negotiated ramp with min A.  Performed simulated car transfer from EOM at 30" to simulate mid size SUV height. With min A.  Patient standing step to w/c with min A with RW.  In w/c noted urine leaking around foley.  Returned to room and RN in to flush foley due to blood clot.  Patient standing at sink with min A and at least 1 UE support while cleaning perineal area.  Assist to don clean shorts in standing mod A.  Patient assisted to recliner.  Education for safety with transfers and ambulation in the room and w/c set up to pt's wife.  She return demonstrated with pt standing from recliner, walking with RW around bed to w/c.  And wife signed off to assist.  Discussed with pt and wife potential range for LOS and plans for setting d/c at team conference.  Answered questions  and pt left in recliner with wife in the room.  RN aware of wife  checked off.  Mobility Bed Mobility Bed Mobility: Rolling Right;Rolling Left;Right Sidelying to Sit;Sit to Supine Rolling Right: Supervision/verbal cueing Rolling Left: Supervision/Verbal cueing Right Sidelying to Sit: Contact Guard/Touching assist Sit to Supine: Minimal Assistance - Patient > 75% Transfers Transfers: Sit to Stand;Stand to Sit;Stand Pivot Transfers Sit to Stand: Minimal Assistance - Patient > 75% Stand to Sit: Minimal Assistance - Patient > 75% Stand Pivot Transfers: Minimal Assistance - Patient > 75% Transfer (Assistive device): Rolling walker Locomotion  Gait Ambulation: Yes Gait Distance (Feet): 220 Feet Assistive device: Rolling walker Gait Gait: Yes Gait Pattern: Step-through pattern;Decreased stride length;Trunk flexed;Wide base of support Stairs / Additional Locomotion Stairs: No Ramp: Minimal Assistance - Patient >75% Wheelchair Mobility Wheelchair Mobility: Yes Wheelchair Assistance: Chartered loss adjuster: Both upper extremities Wheelchair Parts Management: Needs assistance Distance: 150'   Discharge Criteria: Patient will be discharged from PT if patient refuses treatment 3 consecutive times without medical reason, if treatment goals not met, if there is a change in medical status, if patient makes no progress towards goals or if patient is discharged from hospital.  The above assessment, treatment plan, treatment alternatives and goals were discussed and mutually agreed upon: by patient and by family  Jamison Oka, PT 01/31/2021, 4:44 PM

## 2021-01-31 NOTE — Progress Notes (Signed)
PROGRESS NOTE   Subjective/Complaints:  C/o uncomfortable hospital bed , no BM x 2 days   Review of Systems  Constitutional:  Positive for malaise/fatigue.  HENT: Negative.    Eyes: Negative.   Respiratory: Negative.    Cardiovascular: Negative.   Gastrointestinal:  Positive for constipation.  Genitourinary:        Foley  Musculoskeletal:  Positive for joint pain.  Skin: Negative.   Neurological: Negative.   Endo/Heme/Allergies: Negative.   Psychiatric/Behavioral: Negative.      Objective:   No results found. No results for input(s): WBC, HGB, HCT, PLT in the last 72 hours. Recent Labs    01/30/21 0407  NA 135  K 3.8  CL 107  CO2 22  GLUCOSE 119*  BUN 16  CREATININE 0.76  CALCIUM 9.2    Intake/Output Summary (Last 24 hours) at 01/31/2021 [id number] Last data filed at 01/31/2021 0500 Gross per 24 hour  Intake 240 ml  Output 550 ml  Net -310 ml        Physical Exam: Vital Signs Blood pressure 101/72, pulse 93, temperature 98.5 F (36.9 C), temperature source Oral, resp. rate 17, height '6\' 3"'$  (1.905 m), SpO2 100 %.   General: No acute distress Mood and affect are appropriate Heart: Regular rate and rhythm no rubs murmurs or extra sounds Lungs: Clear to auscultation, breathing unlabored, no rales or wheezes Abdomen: Positive bowel sounds, soft nontender to palpation, nondistended Extremities: No clubbing, cyanosis, or edema Skin: No evidence of breakdown, no evidence of rash Neurologic: Cranial nerves II through XII intact, motor strength is 4/5 in bilateral deltoid, bicep, tricep, grip, hip flexor, knee extensors, ankle dorsiflexor and plantar flexor Sensory exam normal sensation to light touch and proprioception in bilateral upper and lower extremities Cerebellar exam normal finger to nose to finger as well as heel to shin in bilateral upper and lower extremities Musculoskeletal: Full range of motion in  all 4 extremities. No joint swelling   Assessment/Plan: 1. Functional deficits which require 3+ hours per day of interdisciplinary therapy in a comprehensive inpatient rehab setting. Physiatrist is providing close team supervision and 24 hour management of active medical problems listed below. Physiatrist and rehab team continue to assess barriers to discharge/monitor patient progress toward functional and medical goals  Care Tool:  Bathing              Bathing assist       Upper Body Dressing/Undressing Upper body dressing   What is the patient wearing?: Hospital gown only    Upper body assist Assist Level: Maximal Assistance - Patient 25 - 49%    Lower Body Dressing/Undressing Lower body dressing            Lower body assist       Toileting Toileting    Toileting assist Assist for toileting: Total Assistance - Patient < 25%     Transfers Chair/bed transfer  Transfers assist           Locomotion Ambulation   Ambulation assist              Walk 10 feet activity   Assist  Walk 50 feet activity   Assist           Walk 150 feet activity   Assist           Walk 10 feet on uneven surface  activity   Assist           Wheelchair     Assist               Wheelchair 50 feet with 2 turns activity    Assist            Wheelchair 150 feet activity     Assist          Blood pressure 101/72, pulse 93, temperature 98.5 F (36.9 C), temperature source Oral, resp. rate 17, height '6\' 3"'$  (1.905 m), SpO2 100 %.     Medical Problem List and Plan: 1.  Debility secondary to acute prostatitis with severe sepsis and associated complications             -patient may  shower             -ELOS/Goals: 10-14 days, mod I to supervision with PT/OT 2.  Antithrombotics: -DVT/anticoagulation: SCDs             -antiplatelet therapy: 3. Pain Management: Neurontin 600 mg twice daily, Lidoderm patch  as directed, tramadol 50 mg every 6 hours as needed 4. Mood: Provide emotional support             -antipsychotic agents: N/A 5. Neuropsych: This patient is capable of making decisions on his own behalf. 6. Skin/Wound Care: Routine skin checks 7. Fluids/Electrolytes/Nutrition: Routine in and outs with follow-up chemistries 8.  Dysphagia.  Diet advanced to mechanical soft. 43.  Newly diagnosed transient atrial fibrillation RVR.  Felt to be related to sepsis.  Cardiac rate controlled.  Follow-up cardiology services from a distance 10.  Transaminitis.  LFTs normalizing.  Follow-up panel. 11.  Diabetes mellitus.  Glucophage 500 mg twice daily.  No SSI for now CBG (last 3)  Recent Labs    01/30/21 0743 01/30/21 1622 01/31/21 0633  GLUCAP 139* 144* 133*    12. Prostatitis: abx completed             -continue proscar and flomax             -DO NOT REMOVE FOLEY    LOS: 1 days A FACE TO FACE EVALUATION WAS PERFORMED  Charlett Blake 01/31/2021, 6:38 AM

## 2021-01-31 NOTE — Progress Notes (Signed)
Inpatient Rehabilitation  Patient information reviewed and entered into eRehab system by Natacia Chaisson M. Renny Gunnarson, M.A., CCC/SLP, PPS Coordinator.  Information including medical coding, functional ability and quality indicators will be reviewed and updated through discharge.    

## 2021-02-01 DIAGNOSIS — R5381 Other malaise: Secondary | ICD-10-CM | POA: Diagnosis not present

## 2021-02-01 NOTE — Progress Notes (Signed)
Patient continues to have bloody urine but no blood clots seen. Bloody urine a little lighter this morning than last night. Urologist came to check on patient last night. No orders received. Will continue to monitor.

## 2021-02-01 NOTE — Progress Notes (Addendum)
PROGRESS NOTE   Subjective/Complaints:  C/o uncomfortable hospital bed , no BM x 2 days , seen by on call uro resident yesterday pm  Review of Systems  Constitutional:  Positive for malaise/fatigue.  HENT: Negative.    Eyes: Negative.   Respiratory: Negative.    Cardiovascular: Negative.   Gastrointestinal:  Positive for constipation.  Genitourinary:  Positive for hematuria.       Foley  Musculoskeletal:  Positive for joint pain.  Skin: Negative.   Neurological: Negative.   Endo/Heme/Allergies: Negative.   Psychiatric/Behavioral: Negative.      Objective:   No results found. Recent Labs    01/31/21 1806  WBC 8.0  HGB 10.4*  HCT 31.5*  PLT 339   Recent Labs    01/30/21 0407  NA 135  K 3.8  CL 107  CO2 22  GLUCOSE 119*  BUN 16  CREATININE 0.76  CALCIUM 9.2     Intake/Output Summary (Last 24 hours) at 02/01/2021 0648 Last data filed at 02/01/2021 0300 Gross per 24 hour  Intake 980 ml  Output 2232 ml  Net -1252 ml         Physical Exam: Vital Signs Blood pressure 128/82, pulse 88, temperature 98.2 F (36.8 C), temperature source Oral, resp. rate 20, height '6\' 3"'$  (1.905 m), SpO2 99 %.   General: No acute distress Mood and affect are appropriate Heart: Regular rate and rhythm no rubs murmurs or extra sounds Lungs: Clear to auscultation, breathing unlabored, no rales or wheezes Abdomen: Positive bowel sounds, soft nontender to palpation, nondistended Extremities: No clubbing, cyanosis, or edema Skin: No evidence of breakdown, no evidence of rash   Neurologic: Cranial nerves II through XII intact, motor strength is 4/5 in bilateral deltoid, bicep, tricep, grip, hip flexor, knee extensors, ankle dorsiflexor and plantar flexor Sensory exam normal sensation to light touch and proprioception in bilateral upper and lower extremities Cerebellar exam normal finger to nose to finger as well as heel to  shin in bilateral upper and lower extremities Musculoskeletal: Full range of motion in all 4 extremities. No joint swelling   Assessment/Plan: 1. Functional deficits which require 3+ hours per day of interdisciplinary therapy in a comprehensive inpatient rehab setting. Physiatrist is providing close team supervision and 24 hour management of active medical problems listed below. Physiatrist and rehab team continue to assess barriers to discharge/monitor patient progress toward functional and medical goals  Care Tool:  Bathing    Body parts bathed by patient: Right arm, Left arm, Chest, Abdomen, Front perineal area, Buttocks, Right upper leg, Left upper leg, Face   Body parts bathed by helper: Right lower leg, Left lower leg     Bathing assist Assist Level: Minimal Assistance - Patient > 75%     Upper Body Dressing/Undressing Upper body dressing   What is the patient wearing?: Pull over shirt    Upper body assist Assist Level: Independent    Lower Body Dressing/Undressing Lower body dressing      What is the patient wearing?: Pants     Lower body assist Assist for lower body dressing: Minimal Assistance - Patient > 75%     Toileting Toileting  Toileting assist Assist for toileting: Minimal Assistance - Patient > 75%     Transfers Chair/bed transfer  Transfers assist     Chair/bed transfer assist level: Minimal Assistance - Patient > 75%     Locomotion Ambulation   Ambulation assist      Assist level: Contact Guard/Touching assist Assistive device: Walker-rolling Max distance: 220'   Walk 10 feet activity   Assist     Assist level: Minimal Assistance - Patient > 75% Assistive device: Walker-rolling   Walk 50 feet activity   Assist    Assist level: Contact Guard/Touching assist Assistive device: Walker-rolling    Walk 150 feet activity   Assist    Assist level: Minimal Assistance - Patient > 75% Assistive device: Walker-rolling     Walk 10 feet on uneven surface  activity   Assist     Assist level: Minimal Assistance - Patient > 75% Assistive device: Aeronautical engineer Will patient use wheelchair at discharge?: No Type of Wheelchair: Manual    Wheelchair assist level: Supervision/Verbal cueing Max wheelchair distance: 150'    Wheelchair 50 feet with 2 turns activity    Assist        Assist Level: Supervision/Verbal cueing   Wheelchair 150 feet activity     Assist      Assist Level: Supervision/Verbal cueing   Blood pressure 128/82, pulse 88, temperature 98.2 F (36.8 C), temperature source Oral, resp. rate 20, height '6\' 3"'$  (1.905 m), SpO2 99 %.     Medical Problem List and Plan: 1.  Debility secondary to acute prostatitis with severe sepsis and associated complications             -patient may  shower             -ELOS/Goals: 10-14 days, mod I to supervision with PT/OT 2.  Antithrombotics: -DVT/anticoagulation: SCDs             -antiplatelet therapy: 3. Pain Management: Neurontin 600 mg twice daily, Lidoderm patch as directed, tramadol 50 mg every 6 hours as needed 4. Mood: Provide emotional support             -antipsychotic agents: N/A 5. Neuropsych: This patient is capable of making decisions on his own behalf. 6. Skin/Wound Care: Routine skin checks 7. Fluids/Electrolytes/Nutrition: Routine in and outs with follow-up chemistries 8.  Dysphagia.  Diet advanced to mechanical soft. 45.  Newly diagnosed transient atrial fibrillation RVR.  Felt to be related to sepsis.  Cardiac rate controlled.  Follow-up cardiology services from a distance 10.  Transaminitis.  LFTs normalizing.  Follow-up panel. 11.  Diabetes mellitus.  Glucophage 500 mg twice daily.  No SSI for now CBG (last 3)  Recent Labs    01/30/21 1622 01/31/21 0633 01/31/21 1112  GLUCAP 144* 133* 138*     12. Prostatitis: abx completed- hemorrhagic- some worsening hematuria yesterday after  move to new room, need to keep foley tube secure ,no sig drop in Hgb, as per uro cont to irrigate foley , inform uro if there is increased clot formation or thick blood in tube             -continue proscar and flomax             -DO NOT REMOVE FOLEY- told pt to make appt with Dr Diona Fanti in [place] 8/2    LOS: 2 days A FACE TO [place] E Alyan Hartline 02/01/2021, 6:48  AM

## 2021-02-01 NOTE — Discharge Instructions (Addendum)
Inpatient Rehab Discharge Instructions  Joseph Banks Discharge date and time: 02/09/21   Activities/Precautions/ Functional Status: Activity: As tolerated Diet: Diabetic diet Wound Care: Routine skin checks   Functional status:  ___ No restrictions     ___ Walk up steps independently ___ 24/7 supervision/assistance   ___ Walk up steps with assistance _X__ Intermittent supervision/assistance  ___ Bathe/dress independently ___ Walk with walker     __x_ Bathe/dress with assistance ___ Walk Independently    ___ Shower independently ___ Walk with assistance    ___ Shower with assistance __X_ No alcohol     ___ Return to work/school ________  Special Instructions: No driving smoking or alcohol    COMMUNITY REFERRALS UPON DISCHARGE:    Outpatient: PT & SP             Agency:[facility]  Phone:[phone]              Appointment Date/Time:WILL CALL WIFE TO ARRANGE APPOINTMENTS  Medical Equipment/Items Ordered:HAS ROLLING WALKER FROM PREVIOUS SURGERY                                                 Agency/Supplier:NA   My questions have been answered and I understand these instructions. I will adhere to these goals and the provided educational materials after my discharge from the hospital.  Patient/Caregiver Signature _______________________________ Date __________  Clinician Signature _______________________________________ Date __________  Please bring this form and your medication list with you to all your follow-up doctor's appointments.

## 2021-02-02 LAB — CBC WITH DIFFERENTIAL/PLATELET
Abs Immature Granulocytes: 0.03 10*3/uL (ref 0.00–0.07)
Basophils Absolute: 0 10*3/uL (ref 0.0–0.1)
Basophils Relative: 1 %
Eosinophils Absolute: 0.1 10*3/uL (ref 0.0–0.5)
Eosinophils Relative: 2 %
HCT: 30.4 % — ABNORMAL LOW (ref 39.0–52.0)
Hemoglobin: 10.3 g/dL — ABNORMAL LOW (ref 13.0–17.0)
Immature Granulocytes: 0 %
Lymphocytes Relative: 34 %
Lymphs Abs: 2.4 10*3/uL (ref 0.7–4.0)
MCH: 31 pg (ref 26.0–34.0)
MCHC: 33.9 g/dL (ref 30.0–36.0)
MCV: 91.6 fL (ref 80.0–100.0)
Monocytes Absolute: 0.7 10*3/uL (ref 0.1–1.0)
Monocytes Relative: 10 %
Neutro Abs: 3.9 10*3/uL (ref 1.7–7.7)
Neutrophils Relative %: 53 %
Platelets: 409 10*3/uL — ABNORMAL HIGH (ref 150–400)
RBC: 3.32 MIL/uL — ABNORMAL LOW (ref 4.22–5.81)
RDW: 15 % (ref 11.5–15.5)
WBC: 7.1 10*3/uL (ref 4.0–10.5)
nRBC: 0 % (ref 0.0–0.2)

## 2021-02-02 LAB — COMPREHENSIVE METABOLIC PANEL
ALT: 34 U/L (ref 0–44)
AST: 30 U/L (ref 15–41)
Albumin: 3.1 g/dL — ABNORMAL LOW (ref 3.5–5.0)
Alkaline Phosphatase: 46 U/L (ref 38–126)
Anion gap: 10 (ref 5–15)
BUN: 12 mg/dL (ref 8–23)
CO2: 20 mmol/L — ABNORMAL LOW (ref 22–32)
Calcium: 9.1 mg/dL (ref 8.9–10.3)
Chloride: 99 mmol/L (ref 98–111)
Creatinine, Ser: 0.86 mg/dL (ref 0.61–1.24)
GFR, Estimated: >60 mL/min (ref >60)
Glucose, Bld: 189 mg/dL — ABNORMAL HIGH (ref 70–99)
Potassium: 3.9 mmol/L (ref 3.5–5.1)
Sodium: 129 mmol/L — ABNORMAL LOW (ref 135–145)
Total Bilirubin: 0.7 mg/dL (ref 0.3–1.2)
Total Protein: 7.1 g/dL (ref 6.5–8.1)

## 2021-02-02 MED ORDER — GABAPENTIN 600 MG PO TABS
600.0000 mg | ORAL_TABLET | Freq: Two times a day (BID) | ORAL | Status: DC
  Administered 2021-02-02 – 2021-02-03 (×2): 600 mg via ORAL
  Filled 2021-02-02 (×2): qty 1

## 2021-02-02 NOTE — Progress Notes (Signed)
Occupational Therapy Session Note  Patient Details  Name: Joseph Banks MRN: [id number] Date of Birth: October 28, 1945  Today's Date: 02/02/2021 OT Individual Time: [id number] OT Individual Time Calculation (min): 56 min    Short Term Goals: Week 1:  OT Short Term Goal 1 (Week 1): STG=LTG d/t ELOS  Skilled Therapeutic Interventions/Progress Updates:    Pt greeted at time of session finishing up speaking with MD making morning rounds, no c/o pain resting but did state he has low back pain that is "chronic" that has been flaring up since being in the hospital. Pt ambulated recliner > sink > hall to elevators with CGA/close supervision and RW, standing for oral hygiene with Supervision. Note declined ADL this am. Wheelchair transport > 4th floor main gym and focused on LBP relief with moist heat for 5 minutes with skin intact pre/post and pt stating heat felt good on his back. Stand pivot wheelchair <> mat with close supervision and sit <> supine using log roll method for back preservation and decrease discomfort. In supine, focused on bringing knees > chest and gentle ROM within this stretch for relief, pt stating he does like this stretch. Once sitting EOM, step up 1x10 on small 4" step with BUE support to both improve his comfort with steps for accessing environment and as a standing balance activity. Pt stated this is what he was most nervous about at home. Transported via wheelchair > room and ambulated back to recliner same as above with alarm on call bell in reach. Note pt needing Min/Mod cues throughout session for problem solving, remembering foley bag, sequencing, etc.    Therapy Documentation Precautions:  Precautions Precautions: Fall Restrictions Weight Bearing Restrictions: No      Therapy/Group: Individual Therapy  Viona Gilmore 02/02/2021, 7:16 AM

## 2021-02-02 NOTE — IPOC Note (Signed)
Overall Plan of Care [facility]) Patient Details Name: Joseph Banks MRN: [id number] DOB: 1945-09-13  Admitting Diagnosis: [facility] Problems: Principal Problem:   Debility Active Problems:   Sepsis ([place])     Functional Problem List: Nursing Bladder, Pain, Endurance, Safety, Bowel, Medication Management, Nutrition, Skin Integrity  PT Endurance, Balance, Motor, Safety  OT Balance, Cognition, Endurance, Motor, Pain, Perception, Safety  SLP Cognition  TR         Basic ADL's: OT Grooming, Bathing, Dressing, Toileting     Advanced  ADL's: OT       Transfers: PT Bed Mobility, Bed to Chair, Car, Manufacturing systems engineer, Metallurgist: PT Ambulation, Stairs     Additional Impairments: OT    SLP None      TR      Anticipated Outcomes Item Anticipated Outcome  Self Feeding    Swallowing      Basic self-care  MOD I  Toileting  MOD I toileting, S bathing   Bathroom Transfers MOD I toileting, S bathing  Bowel/Bladder  remain continent of bowel with regular emptying; regain bladder function  Transfers  mod I  Locomotion  S with LRAD  Communication     Cognition  Sup A  Pain  less than 4  Safety/Judgment  supervision and no falls   Therapy Plan: PT Intensity: Minimum of 1-2 x/day ,45 to 90 minutes PT Frequency: 5 out of 7 days PT Duration Estimated Length of Stay: 5-7 days OT Intensity: Minimum of 1-2 x/day, 45 to 90 minutes OT Frequency: 5 out of 7 days OT Duration/Estimated Length of Stay: 7-10 SLP Intensity: Minumum of 1-2 x/day, 30 to 90 minutes SLP Frequency: 3 to 5 out of 7 days SLP Duration/Estimated Length of Stay: 7-10   Due to the current state of emergency, patients may not be receiving their 3-hours of Medicare-mandated therapy.   Team Interventions: Nursing Interventions Patient/Family Education, Bladder Management, Disease Management/Prevention, Pain Management, Medication Management, Discharge Planning, Psychosocial  Support, Skin Care/Wound Management, Bowel Management  PT interventions Ambulation/gait training, Balance/vestibular training, Cognitive remediation/compensation, Functional mobility training, DME/adaptive equipment instruction, Neuromuscular re-education, Stair training, UE/LE Strength taining/ROM, UE/LE Coordination activities, Therapeutic Exercise, Patient/family education, Wheelchair propulsion/positioning, Therapeutic Activities, Functional electrical stimulation, Discharge planning, Disease management/prevention  OT Interventions Balance/vestibular training, Pain management, Self Care/advanced ADL retraining, Therapeutic Activities, UE/LE Coordination activities, Visual/perceptual remediation/compensation, Therapeutic Exercise, Skin care/wound managment, Patient/family education, Functional mobility training, Disease mangement/prevention, Cognitive remediation/compensation, Community reintegration, Discharge planning, DME/adaptive equipment instruction, Neuromuscular re-education, Psychosocial support, Splinting/orthotics, UE/LE Strength taining/ROM, Wheelchair propulsion/positioning  SLP Interventions Cognitive remediation/compensation, Internal/external aids, Environmental controls, Cueing hierarchy, Functional tasks, Medication managment, Patient/family education  TR Interventions    SW/CM Interventions Discharge Planning, Psychosocial Support, Patient/Family Education   Barriers to Discharge MD  Medical stability, Home enviroment access/loayout, Neurogenic bowel and bladder, Lack of/limited family support, and Medication compliance  Nursing Decreased caregiver support, Home environment access/layout, Incontinence, Lack of/limited family support, Weight, Medication compliance, Behavior, Nutrition means Lives with wife in 1 level home with 2 steps to enter and no rails. Wife has no limitations and will be available to provide 24/7 care at discharge.  PT Other (comments) foley  OT Inaccessible  home environment, Decreased caregiver support    SLP      SW       Team Discharge Planning: Destination: PT-Home ,OT- Home , SLP-Home Projected Follow-up: PT-Outpatient PT, OT-  Outpatient OT, SLP-Outpatient SLP Projected Equipment Needs: PT-None recommended by PT, OT-  ,  SLP-None recommended by SLP Equipment Details: PT- , OT-  Patient/family involved in discharge planning: PT- Family member/caregiver, Patient,  OT-Family member/caregiver, SLP-Patient, Family member/caregiver  MD ELOS: 7-10 days Medical Rehab Prognosis:  Good Assessment: Pt is a 75 yr old male with new transient Afib with RVR, recent sepsis due to prostatitis and associated Complications; Also has DM on Metformin-  Goals mod I to supervision- also will need to see if can get his Foley out/do voiding trial while here?    See Team Conference Notes for weekly updates to the plan of care

## 2021-02-02 NOTE — Progress Notes (Signed)
Physical Therapy Session Note  Patient Details  Name: Joseph Banks MRN: [id number] Date of Birth: 1946-05-25  Today's Date: 02/02/2021 PT Individual Time: 1420-1540 PT Individual Time Calculation (min): 80 min   Short Term Goals: Week 1:  PT Short Term Goal 1 (Week 1): STG=LTG due to ELOS  Skilled Therapeutic Interventions/Progress Updates:    Pt received in recliner and agreeable to therapy. No complaint of pain. [name] present. Session focused on gait, stair training, and endurance. Pt performed Sit to stand to RW with supervision throughout session. ambulatory transfer to w/c with RW and CGA. Pt then transported to therapy gym for time management and energy conservation. Pt navigated 6" steps 3x4 with CGA and BUE support and reciprocal pattern. Pt then navigated 2 steps with simulated grab bar per home environment to build self efficacy and confidence. Pt performed step up with CL knee drive x 10 ea leg for improved strength and coordination. Pt propelled w/c with BUE x 400 ft with supervision for UE strength and endurance. Gait training x 200, 2x300 ft with RW and CGA, vc's for increased foot clearance and heel strike. Pt demoes poot foot clearance and slumped posture. Pt also stood during elevator ride with no LOB and navigated uneven concrete in outdoor environment without LOB. Pt performed 2x5 Sit to stand without use of his hands for LE strength. Required min A to power up to stand, which mildly improved with cues for anterior weight shift. Pt left outside with his wife present and in good condition, pt and wife [name] educated to remain in w/c while outside and while transporting back to unit.  Therapy Documentation Precautions:  Precautions Precautions: Fall Restrictions Weight Bearing Restrictions: No    Therapy/Group: Individual Therapy  Mickel Fuchs 02/02/2021, 3:53 PM

## 2021-02-02 NOTE — Progress Notes (Signed)
PROGRESS NOTE   Subjective/Complaints:   Pt feels like he's up all night due to back pain and pain radiating down his legs- hasn't been asking for tramadol, just tylenol and gabapentin which is scheduled.  On 5 mg Ambien and says also doesn't sleep, but thinks due to pain.  LBM was yesterday- feels much better in abdomen.   Also upset about foley- had an episode where it "burst" and went "everywhere" this weekend and worried about that.   Will need to check with Dr Vernie Shanks to see when possible to remove foley- H&P says don't remove foley.  Pt upset to hear that.    ROS:  Pt denies SOB, abd pain, CP, N/V/C/D, and vision changes   Objective:   No results found. Recent Labs    01/31/21 1806 02/02/21 0712  WBC 8.0 7.1  HGB 10.4* 10.3*  HCT 31.5* 30.4*  PLT 339 409*    No results for input(s): NA, K, CL, CO2, GLUCOSE, BUN, CREATININE, CALCIUM in the last 72 hours.   Intake/Output Summary (Last 24 hours) at 02/02/2021 0823 Last data filed at 02/02/2021 0306 Gross per 24 hour  Intake 440 ml  Output 2100 ml  Net -1660 ml         Physical Exam: Vital Signs Blood pressure 134/80, pulse 90, temperature 98.3 F (36.8 C), temperature source Oral, resp. rate 20, height '6\' 3"'$  (1.905 m), SpO2 100 %.    General: awake, alert, appropriate, sitting up in bedside chair; has questions he wants to  discuss;  NAD HENT: conjugate gaze; oropharynx moist CV: regular rate; no JVD Pulmonary: CTA B/L; no W/R/R- good air movement GI: soft, NT, ND, (+)BS; normoactive BS Psychiatric: appropriate, but worried and asking appropriate questions Neurological: alert  Neurologic: Cranial nerves II through XII intact, motor strength is 4/5 in bilateral deltoid, bicep, tricep, grip, hip flexor, knee extensors, ankle dorsiflexor and plantar flexor Sensory exam normal sensation to light touch and proprioception in bilateral upper and lower  extremities Cerebellar exam normal finger to nose to finger as well as heel to shin in bilateral upper and lower extremities Musculoskeletal: Full range of motion in all 4 extremities. No joint swelling   Assessment/Plan: 1. Functional deficits which require 3+ hours per day of interdisciplinary therapy in a comprehensive inpatient rehab setting. Physiatrist is providing close team supervision and 24 hour management of active medical problems listed below. Physiatrist and rehab team continue to assess barriers to discharge/monitor patient progress toward functional and medical goals  Care Tool:  Bathing    Body parts bathed by patient: Right arm, Left arm, Chest, Abdomen, Front perineal area, Buttocks, Right upper leg, Left upper leg, Face   Body parts bathed by helper: Right lower leg, Left lower leg     Bathing assist Assist Level: Minimal Assistance - Patient > 75%     Upper Body Dressing/Undressing Upper body dressing   What is the patient wearing?: Pull over shirt    Upper body assist Assist Level: Independent    Lower Body Dressing/Undressing Lower body dressing      What is the patient wearing?: Pants     Lower body assist Assist for lower  body dressing: Minimal Assistance - Patient > 75%     Chartered loss adjuster assist Assist for toileting: Minimal Assistance - Patient > 75%     Transfers Chair/bed transfer  Transfers assist     Chair/bed transfer assist level: Minimal Assistance - Patient > 75%     Locomotion Ambulation   Ambulation assist      Assist level: Contact Guard/Touching assist Assistive device: Walker-rolling Max distance:  (walked from 5C02 to nursing station)   Walk 10 feet activity   Assist     Assist level: Minimal Assistance - Patient > 75% Assistive device: Walker-rolling   Walk 50 feet activity   Assist    Assist level: Contact Guard/Touching assist Assistive device: Walker-rolling    Walk 150  feet activity   Assist    Assist level: Minimal Assistance - Patient > 75% Assistive device: Walker-rolling    Walk 10 feet on uneven surface  activity   Assist     Assist level: Minimal Assistance - Patient > 75% Assistive device: Aeronautical engineer Will patient use wheelchair at discharge?: No Type of Wheelchair: Manual    Wheelchair assist level: Supervision/Verbal cueing Max wheelchair distance: 150'    Wheelchair 50 feet with 2 turns activity    Assist        Assist Level: Supervision/Verbal cueing   Wheelchair 150 feet activity     Assist      Assist Level: Supervision/Verbal cueing   Blood pressure 134/80, pulse 90, temperature 98.3 F (36.8 C), temperature source Oral, resp. rate 20, height '6\' 3"'$  (1.905 m), SpO2 100 %.     Medical Problem List and Plan: 1.  Debility secondary to acute prostatitis with severe sepsis and associated complications             -patient may  shower             -ELOS/Goals: 10-14 days, mod I to supervision with PT/OT  -con't PT and OT 2.  Antithrombotics: -DVT/anticoagulation: SCDs             -antiplatelet therapy: 3. Pain Management: Neurontin 600 mg twice daily, Lidoderm patch as directed, tramadol 50 mg every 6 hours as needed  7/25- hasn't been taking tramadol for pain-asked pt to try before changing other meds. If doesn't work, will change meds around, increase things so can sleep/pain.  4. Mood: Provide emotional support             -antipsychotic agents: N/A 5. Neuropsych: This patient is capable of making decisions on his own behalf. 6. Skin/Wound Care: Routine skin checks 7. Fluids/Electrolytes/Nutrition: Routine in and outs with follow-up chemistries 8.  Dysphagia.  Diet advanced to mechanical soft. 32.  Newly diagnosed transient atrial fibrillation RVR.  Felt to be related to sepsis.  Cardiac rate controlled.  Follow-up cardiology services from a distance 10.  Transaminitis.   LFTs normalizing.  Follow-up panel.  7/25- resolved 11.  Diabetes mellitus.  Glucophage 500 mg twice daily.  No SSI for now CBG (last 3)  Recent Labs    01/30/21 1622 01/31/21 0633 01/31/21 1112  GLUCAP 144* 133* 138*    7/25- BG's 130s-140s- will con't regimen- if doesn't improve, might need to increase Metformin  12. Prostatitis: abx completed- hemorrhagic- some worsening hematuria yesterday after move to new room, need to keep foley tube secure ,no sig drop in Hgb, as per uro cont to irrigate foley , inform uro if  there is increased clot formation or thick blood in tube             -continue proscar and flomax             -DO NOT REMOVE FOLEY- told pt to make appt with Dr Diona Fanti in [place] 8/2  7/25- will call Dr Diona Fanti to see if can try voiding trial in rehab?    LOS: 3 days A FACE TO FACE EVALUATION WAS PERFORMED  Domini Vandehei 02/02/2021, 8:23 AM

## 2021-02-02 NOTE — Progress Notes (Signed)
Speech Language Pathology Daily Session Note  Patient Details  Name: MOTTY SPANO MRN: [id number] Date of Birth: [birth year]  Today's Date: 02/02/2021 SLP Individual Time: 0850-0930 SLP Individual Time Calculation (min): 40 min  Short Term Goals: Week 1: SLP Short Term Goal 1 (Week 1): STG=LTG d/t ELOS  Skilled Therapeutic Interventions:Skilled ST services focused on cognitive skills. SLP facilitated personal medication management with BID pill organizer pt demonstrated mod I for mildly complex problem solving and recall. SLP provided high-lighter to identifying main idea to increase reading comprehension with current book and provided education. SLP was unable to facilitate reading comprehension task due to time restraints. Pt was left in room with call bell within reach and chair alarm set. SLP recommends to continue skilled services.     Pain Pain Assessment Pain Scale: 0-10 Pain Score: 1  Faces Pain Scale: Hurts a little bit Pain Type: Acute pain Pain Location: Hip Pain Orientation: Right;Left Pain Descriptors / Indicators: Aching Patients Stated Pain Goal: 2 Pain Intervention(s): Medication (See eMAR) PAINAD (Pain Assessment in Advanced Dementia) Breathing: normal Negative Vocalization: none Facial Expression: smiling or inexpressive  Therapy/Group: Individual Therapy  Felder Lebeda  [facility] 02/02/2021, 7:41 AM

## 2021-02-02 NOTE — Progress Notes (Signed)
Inpatient Rehabilitation Care Coordinator Assessment and Plan Patient Details  Name: Joseph Banks MRN: [id number] Date of Birth: [birth year]  Today's Date: 02/02/2021  Hospital Problems: Principal Problem:   Debility Active Problems:   Sepsis [facility])  Past Medical History:  Past Medical History:  Diagnosis Date   BPH (benign prostatic hyperplasia)    Diabetes mellitus without complication ([place])    Past Surgical History:  Past Surgical History:  Procedure Laterality Date   IR FLUORO GUIDED NEEDLE PLC ASPIRATION/INJECTION LOC  01/16/2021   TRANSURETHRAL RESECTION OF PROSTATE     Social History:  reports that he has quit smoking. He has never used smokeless tobacco. No history on file for alcohol use and drug use.  Family / Support Systems Marital Status: Married Patient Roles: Spouse, Parent Spouse/Significant Other: Joseph Banks [phone] Children: Joseph Banks [phone]  Joseph Banks- son [phone] Other Supports: Friends and church members Anticipated Caregiver: wife Ability/Limitations of Caregiver: In good health Caregiver Availability: 24/7 Family Dynamics: Close knit family who all pull together in times of need. Pt is very active and wants to get back to this level when recovers.  Social History Preferred language: English Religion:  Cultural Background: No issues Education: Some college Read: Yes Write: Yes Employment Status: Retired Public relations account executive Issues: No issues Guardian/Conservator: None-according to MD pt is capable of making his own decisions while here   Abuse/Neglect Abuse/Neglect Assessment Can Be Completed: Yes Physical Abuse: Denies Verbal Abuse: Denies Sexual Abuse: Denies Exploitation of patient/patient's resources: Denies Self-Neglect: Denies  Emotional Status Pt's affect, behavior and adjustment status: Pt is motivated to do well and wants to get up and move more than when therapies are in there. He and wife were  walking on the other unit he came from. Will need to get wife checked off then. Recent Psychosocial Issues: other health issues Psychiatric History: No history deferred depression screen due to coping appropriately and able to verbalize his condition. Substance Abuse History: No issues  Patient / Family Perceptions, Expectations & Goals Pt/Family understanding of illness & functional limitations: Pt and wife can explain his bladder issues and are hopeful can do a trial before having to go home with a foley. Both talk with the MD and wife wants to be kept updated, since she feels pt doesn't always listen or catch what the MD says when rounding Premorbid pt/family roles/activities: Husband, father, retiree, church member, etc Anticipated changes in roles/activities/participation: resume Pt/family expectations/goals: Pt states: " I want to get back to moving well and not have a foley."  Wife states: " I am hopeful he will do well and recover from this, I can do a lot for him but need him to do also."  [employer]: None Premorbid Home Care/DME Agencies: Other (Comment) (has rw and tub seat form past hip surgery) Transportation available at discharge: wife  Discharge Planning Living Arrangements: Spouse/significant other Support Systems: Spouse/significant other, Children, Friends/neighbors, Social worker community Type of Residence: Private residence Insurance Resources: Multimedia programmer (specify) Doctor, general practice) Financial Resources: Social Security Financial Screen Referred: No Living Expenses: Own Money Management: Patient, Spouse Does the patient have any problems obtaining your medications?: No Home Management: wife Patient/Family Preliminary Plans: Return home with wife who is able to provide supervision but hopes he is sleeping better she can not do day and night care of him. Aware evaluatted and goals set, wife wants to speak with the MD regarding foley  issue. Will leave her a message to contact wife Care Coordinator Anticipated Follow Up  Needs: HH/OP  Clinical Impression Pleasant gentleman who seems to have some cognitive deficits and needs worker to repeat themselves at times. His wife is concerned he is not sleeping and questions need for foley at home. Have asked MD to contact wife to address her questions and will update after conference tomorrow.  Elease Hashimoto 02/02/2021, 12:09 PM

## 2021-02-02 NOTE — Progress Notes (Signed)
Inpatient Rehabilitation Center Individual Statement of Services  Patient Name:  Joseph Banks  Date:  02/02/2021  Welcome to the [facility].  Our goal is to provide you with an individualized program based on your diagnosis and situation, designed to meet your specific needs.  With this comprehensive rehabilitation program, you will be expected to participate in at least 3 hours of rehabilitation therapies Monday-Friday, with modified therapy programming on the weekends.  Your rehabilitation program will include the following services:  Physical Therapy (PT), Occupational Therapy (OT), Speech Therapy (ST), 24 hour per day rehabilitation nursing, Care Coordinator, Rehabilitation Medicine, Nutrition Services, and Pharmacy Services  Weekly team conferences will be held on Tuesday to discuss your progress.  Your Inpatient Rehabilitation Care Coordinator will talk with you frequently to get your input and to update you on team discussions.  Team conferences with you and your family in attendance may also be held.  Expected length of stay: 5-7 days  Overall anticipated outcome: supervision level  Depending on your progress and recovery, your program may change. Your Inpatient Rehabilitation Care Coordinator will coordinate services and will keep you informed of any changes. Your Inpatient Rehabilitation Care Coordinator's name and contact numbers are listed  below.  The following services may also be recommended but are not provided by the [place] will be made to provide these services after discharge if needed.  Arrangements include referral to agencies that provide these services.  Your insurance has been verified to be:  [place] primary doctor is:  Metallurgist  Pertinent information will be shared with your doctor and your  insurance company.  Inpatient Rehabilitation Care Coordinator:  Ovidio Kin, [place] or Emilia Beck  Information discussed with and copy given to patient by: Elease Hashimoto, 02/02/2021, 9:45 AM

## 2021-02-03 DIAGNOSIS — E871 Hypo-osmolality and hyponatremia: Secondary | ICD-10-CM

## 2021-02-03 DIAGNOSIS — R339 Retention of urine, unspecified: Secondary | ICD-10-CM

## 2021-02-03 DIAGNOSIS — N410 Acute prostatitis: Secondary | ICD-10-CM

## 2021-02-03 MED ORDER — GABAPENTIN 600 MG PO TABS
600.0000 mg | ORAL_TABLET | Freq: Three times a day (TID) | ORAL | Status: DC
  Administered 2021-02-03 – 2021-02-09 (×18): 600 mg via ORAL
  Filled 2021-02-03 (×18): qty 1

## 2021-02-03 NOTE — Progress Notes (Signed)
Occupational Therapy Session Note  Patient Details  Name: Joseph Banks MRN: [id number] Date of Birth: Feb 27, 1946  Today's Date: 02/03/2021 OT Individual Time: 1342-1450 OT Individual Time Calculation (min): 68 min    Short Term Goals: Week 1:  OT Short Term Goal 1 (Week 1): STG=LTG d/t ELOS   Skilled Therapeutic Interventions/Progress Updates:    Pt greeted at time of session sitting up in recliner agreeable to OT session, wife [name] present and remained throughout most of session. No pain reported. Pt/wife having several questions at beginning of session regarding DC date (wanting to move up and educated that pending medical status/voiding and verbalized understanding) as well as f/u, DME, etc. Pt them ambulating in room no AD recliner > wheelchair CGA and therapist transport to ortho gym. Performed 3 rounds of dynamic standing at [facility] machine, varying between 86-90% accuracy each trial for sequencing #1-50, letters A-Z, and rotating colors in sequence of 3. Last two trials performed while standing on airex pad as well for additional balance challenge, no LOB noted and able to stand without UE support. Transported back to room and ambulated back to recliner same manner as above. Hand outs provided for wife/pt as she had questions about BSC vs shower chair to use in walk in shower at home. Pt up in recliner with alarm off as wife has been checked off on toileting the pt.    Therapy Documentation Precautions:  Precautions Precautions: Fall Restrictions Weight Bearing Restrictions: No    Therapy/Group: Individual Therapy  Viona Gilmore 02/03/2021, 7:18 AM

## 2021-02-03 NOTE — Progress Notes (Signed)
Patient ID: Joseph Banks, male   DOB: September 03, 1945, 75 y.o.   MRN: [id number]  Met with pt and wife to discuss team conference goals supervision level and target discharge date 8/1. Both are hopeful he will do well with the voiding trial. Wife to be checked off with ambulating with pt in room and hallway. Will continue to work on discharge needs.

## 2021-02-03 NOTE — Progress Notes (Addendum)
Foley catheter clogged and patient urinated out side of the catheter. Patient requested to take cathter out and he wanted voiding trial instead of placing new foley cath.Catheter removed at 0430 per on call provider Reesa Chew) order. Please see Charge nurse Lillia Abed note for further information.

## 2021-02-03 NOTE — Progress Notes (Signed)
Physical Therapy Session Note  Patient Details  Name: RUGER OBERLANDER MRN: [id number] Date of Birth: [birth year]  Today's Date: 02/03/2021 PT Individual Time: 0800-0900 PT Individual Time Calculation (min): 60 min   Short Term Goals: Week 1:  PT Short Term Goal 1 (Week 1): STG=LTG due to ELOS  Skilled Therapeutic Interventions/Progress Updates:    Pt received in recliner and agreeable to therapy.  Nsg present for meds pass. Sit to stand to RW supervision throughout session. Pt ambulated from room to therapy gym, x 300 ft, including elevator with CGA and RW. Pt performed 3x5 squats to tap thighs on mat table. Pt demoes L knee valgus, reports he injured that knee as a teenager. Pt was able to correct valgus with visual and tactile cueing. Pt directed in standing 3 way hip with RW and CGA. VC for upright posture and correct muscle activation. Pt ambulated back to room, demoing R IR and incr L knee valgus as he became fatigued. Pt returned to recliner and was left with all needs in reach.   Therapy Documentation Precautions:  Precautions Precautions: Fall Restrictions Weight Bearing Restrictions: No     Therapy/Group: Individual Therapy  Mickel Fuchs 02/03/2021, 4:23 PM

## 2021-02-03 NOTE — Patient Care Conference (Signed)
Inpatient RehabilitationTeam Conference and Plan of Care Update Date: 02/03/2021   Time: 11:46 AM    Patient Name: Joseph Banks      Medical Record Number: [id number]  Date of Birth: Dec 27, 1945 Sex: Male         Room/Bed: [id number] Payor Info: Payor: [employer] MEDICARE / Plan: [employer] MEDICARE HMO/PPO / Product Type: *No Product type* /    Admit Date/Time:  01/30/2021  3:56 PM  Primary Diagnosis:  [place] [facility] Problems: Principal Problem:   Debility Active Problems:   Sepsis [facility])    Expected Discharge Date: Expected Discharge Date: 02/09/21  Team Members Present: Physician leading conference: Dr. Courtney Heys Social Worker Present: Ovidio Kin, LCSW Nurse Present: Dorthula Nettles, RN PT Present: Ailene Rud, PT OT Present: Lillia Corporal, OT SLP Present: Charolett Bumpers, SLP PPS Coordinator present : Gunnar Fusi, SLP     Current Status/Progress Goal Weekly Team Focus  Bowel/Bladder   continent to B/B, last BM 7/25  remain continent  Assess Q shift and PRN   Swallow/Nutrition/ Hydration             ADL's   Min for LB dress/bathe/toilet, CGA ambulatory transfers, anxious, needs cues for problem solving, assist managing Foley  Mod I toilet/dress, Supervision bathing  global endurance, ADL retraining, AE training as needed, ADL transfers, standing balance/endurance   Mobility   Supervision-CGA  gait and transfers  mod I tranfers, supervision gait  conditioning, LE strength   Communication             Safety/Cognition/ Behavioral Observations  Min-Supervision A  Supervision A  higher level problem solving, recall and selective attention   Pain   coronic pain managing with PRN Tramadol and Tylenol  patient will be pain free  Assess Q shift and PRN   Skin   skin dry and intact  remain intact  Assess Q shift and PRN     Discharge Planning:  Home with wife who can assist, would like to try voiding trial if Urologist agrees. Wife here daily   Team  Discussion: Foley discontinued, now doing voiding trial. PVR's q 6 hrs. Na+ 129, CBG's stopped, gabapentin increased for leg pain. Continent bowel. Back pain relieved with walking.  Patient on target to meet rehab goals: yes, min assist ADL's, contact guard ADL transfers with RW. Super anxious. Mod I goals, supervision bathing. Supervision/contact guard goals, definitely has cognitive deficits. SLP reports he scored 15/30 SLUMs. Working on Insurance risk surveyor.  *See Care Plan and progress notes for long and short-term goals.   Revisions to Treatment Plan:  Educate wife on not walking husband in halls without being checked off by therapy.  Teaching Needs: Family eduction, medication management, pain management, bladder management, transfer training, gait training, balance training, endurance training, safety awareness.  Current Barriers to Discharge: Decreased caregiver support, Medical stability, Home enviroment access/layout, Incontinence, Lack of/limited family support, Weight, Weight bearing restrictions, Medication compliance, and Behavior  Possible Resolutions to Barriers: Continue current medications, provide emotional support.     Medical Summary Current Status: foley is now out- continent of bowels; peeing some; was leaking/foley; Back pain relieved with walking; anxious; poor sleep per staff  Barriers to Discharge: Behavior;Medication compliance;Medical stability;Home enviroment access/layout;Incontinence;Other (comments);Decreased family/caregiver support  Barriers to Discharge Comments: going home with wife; cognitive deficits chronic vs acute? very inappropriate behavior intermittently- no calming works- asked SLP to work on emotional regulation- Possible Resolutions to [employer]: increased gabapentin to 600 mg TID for foot/hip pain; ;  found ambulating with wife in hall without clearance  -  SLUMs 15/30; problem solving/memory are the main issues- 8/1 d/c.   Continued Need  for Acute Rehabilitation Level of Care: The patient requires daily medical management by a physician with specialized training in physical medicine and rehabilitation for the following reasons: Direction of a multidisciplinary physical rehabilitation program to maximize functional independence : Yes Medical management of patient stability for increased activity during participation in an intensive rehabilitation regime.: Yes Analysis of laboratory values and/or radiology reports with any subsequent need for medication adjustment and/or medical intervention. : Yes   I attest that I was present, lead the team conference, and concur with the assessment and plan of the team.   Cristi Loron 02/03/2021, 6:18 PM

## 2021-02-03 NOTE — Progress Notes (Signed)
Speech Language Pathology Daily Session Note  Patient Details  Name: Joseph Banks MRN: [id number] Date of Birth: 09-02-1945  Today's Date: 02/03/2021 SLP Individual Time: [id number] SLP Individual Time Calculation (min): 45 min  Short Term Goals: Week 1: SLP Short Term Goal 1 (Week 1): STG=LTG d/t ELOS  Skilled Therapeutic Interventions: Patient agreeable to skilled ST intervention with focus on cognitive goals. Facilitated working Medical laboratory scientific officer retention and verbal reasoning task with 90% accuracy with list of 4 words and 70% accuracy with list of 5 words. Facilitated short-term recall of functional grocery list containing 10 random items. Educated patient on internal and external memory strategies in which he utilized with sup A verbal cues. Patient successfully recalled 10/10 after 2 minute delay, and 8/10 after distractor activity and 7 minute delay. Recalled 10/10 items with semantic cues. Patient required min A verbal redirection for sustained attention and topic maintenance, as he was known to frequently share stories during session and easily distractible. Patient was left in chair with alarm activated and immediate needs within reach at end of session. Continue per current plan of care.      Pain Pain Assessment Pain Score: 0-No pain Faces Pain Scale: No hurt PAINAD (Pain Assessment in Advanced Dementia) Breathing: normal Negative Vocalization: none Facial Expression: smiling or inexpressive Body Language: relaxed Consolability: no need to console PAINAD Score: 0  Therapy/Group: Individual Therapy  Patty Sermons 02/03/2021, 12:09 PM

## 2021-02-03 NOTE — Progress Notes (Signed)
PROGRESS NOTE   Subjective/Complaints:   Pt insisted if didn't get foley removed yesterday he would remove it himself or go AMA- due to foley leaking around catheter, for the 2nd time, pt's foley was removed- I usually wait at least 5-7 days on Flomax before removing, but is voiding some- no PVRs documented, however haven't showed voiding documented since foley removed at 0430 am.   Pt also said LBM was yesterday- is having pain in R hip and feet- insists we try something new/more.  We discussed going up on Gabapentin - will increase to 600 mg TID from BID.   Also explained in voiding trial that might need in/out caths if retaining urine. Pt very angry about this, however explained we don't want him getting prostatitis again.    ROS:  Pt denies SOB, abd pain, CP, N/V/C/D, and vision changes     Objective:   No results found. Recent Labs    01/31/21 1806 02/02/21 0712  WBC 8.0 7.1  HGB 10.4* 10.3*  HCT 31.5* 30.4*  PLT 339 409*   Recent Labs    02/02/21 0712  NA 129*  K 3.9  CL 99  CO2 20*  GLUCOSE 189*  BUN 12  CREATININE 0.86  CALCIUM 9.1    Intake/Output Summary (Last 24 hours) at 02/03/2021 0859 Last data filed at 02/02/2021 2302 Gross per 24 hour  Intake 712 ml  Output 2650 ml  Net -1938 ml        Physical Exam: Vital Signs Blood pressure (!) 146/86, pulse 88, temperature 98.1 F (36.7 C), resp. rate 18, height '6\' 3"'$  (1.905 m), SpO2 98 %.     General: awake, alert, sitting up in bedside chair; foley out; NAD HENT: conjugate gaze; oropharynx moist CV: regular rate; no JVD Pulmonary: CTA B/L; no W/R/R- good air movement GI: soft, NT, ND, (+)BS Psychiatric: irritable and perseverating on foley- angry that might need cathing Neurological: alert  Neurologic: Cranial nerves II through XII intact, motor strength is 4/5 in bilateral deltoid, bicep, tricep, grip, hip flexor, knee extensors, ankle  dorsiflexor and plantar flexor Sensory exam normal sensation to light touch and proprioception in bilateral upper and lower extremities Cerebellar exam normal finger to nose to finger as well as heel to shin in bilateral upper and lower extremities Musculoskeletal: Full range of motion in all 4 extremities. No joint swelling   Assessment/Plan: 1. Functional deficits which require 3+ hours per day of interdisciplinary therapy in a comprehensive inpatient rehab setting. Physiatrist is providing close team supervision and 24 hour management of active medical problems listed below. Physiatrist and rehab team continue to assess barriers to discharge/monitor patient progress toward functional and medical goals  Care Tool:  Bathing    Body parts bathed by patient: Right arm, Left arm, Chest, Abdomen, Front perineal area, Buttocks, Right upper leg, Left upper leg, Face   Body parts bathed by helper: Right lower leg, Left lower leg     Bathing assist Assist Level: Minimal Assistance - Patient > 75%     Upper Body Dressing/Undressing Upper body dressing   What is the patient wearing?: Pull over shirt    Upper body assist Assist  Level: Independent    Lower Body Dressing/Undressing Lower body dressing      What is the patient wearing?: Pants     Lower body assist Assist for lower body dressing: Minimal Assistance - Patient > 75%     Toileting Toileting    Toileting assist Assist for toileting: Minimal Assistance - Patient > 75%     Transfers Chair/bed transfer  Transfers assist     Chair/bed transfer assist level: Minimal Assistance - Patient > 75%     Locomotion Ambulation   Ambulation assist      Assist level: Contact Guard/Touching assist Assistive device: Walker-rolling Max distance: 300   Walk 10 feet activity   Assist     Assist level: Contact Guard/Touching assist Assistive device: Walker-rolling   Walk 50 feet activity   Assist    Assist  level: Contact Guard/Touching assist Assistive device: Walker-rolling    Walk 150 feet activity   Assist    Assist level: Contact Guard/Touching assist Assistive device: Walker-rolling    Walk 10 feet on uneven surface  activity   Assist     Assist level: Minimal Assistance - Patient > 75% Assistive device: Aeronautical engineer Will patient use wheelchair at discharge?: No Type of Wheelchair: Manual    Wheelchair assist level: Supervision/Verbal cueing Max wheelchair distance: 400 ft    Wheelchair 50 feet with 2 turns activity    Assist        Assist Level: Supervision/Verbal cueing   Wheelchair 150 feet activity     Assist      Assist Level: Supervision/Verbal cueing   Blood pressure (!) 146/86, pulse 88, temperature 98.1 F (36.7 C), resp. rate 18, height '6\' 3"'$  (1.905 m), SpO2 98 %.     Medical Problem List and Plan: 1.  Debility secondary to acute prostatitis with severe sepsis and associated complications             -patient may  shower             -ELOS/Goals: 10-14 days, mod I to supervision with PT/OT  -con't PT and OT- pt doesn't appear to understand that can trigger prostatitis if not emptying bladder normally- attempted to explain. He did not voice understanding, in spite of wording it different ways.  2.  Antithrombotics: -DVT/anticoagulation: SCDs             -antiplatelet therapy: 3. Pain Management: Neurontin 600 mg twice daily, Lidoderm patch as directed, tramadol 50 mg every 6 hours as needed  7/25- hasn't been taking tramadol for pain-asked pt to try before changing other meds. If doesn't work, will change meds around, increase things so can sleep/pain. 7/26- took tramadol last night; also will increase gabapentin to 600 mg TID for R hip and  feet nerve pain.   4. Mood: Provide emotional support             -antipsychotic agents: N/A 5. Neuropsych: This patient is capable of making decisions on his own  behalf. 6. Skin/Wound Care: Routine skin checks 7. Fluids/Electrolytes/Nutrition: Routine in and outs with follow-up chemistries 8.  Dysphagia.  Diet advanced to mechanical soft. 62.  Newly diagnosed transient atrial fibrillation RVR.  Felt to be related to sepsis.  Cardiac rate controlled.  Follow-up cardiology services from a distance 10.  Transaminitis.  LFTs normalizing.  Follow-up panel.  7/25- resolved 11.  Diabetes mellitus.  Glucophage 500 mg twice daily.  No SSI for now CBG (last 3)  Recent Labs    01/31/21 1112  GLUCAP 138*   7/26- last BG checked was 7/23- over weekend, they stopped BG checks.   12. Prostatitis: abx completed- hemorrhagic- some worsening hematuria yesterday after move to new room, need to keep foley tube secure ,no sig drop in Hgb, as per uro cont to irrigate foley , inform uro if there is increased clot formation or thick blood in tube             -continue proscar and flomax             -DO NOT REMOVE FOLEY- told pt to make appt with Dr Diona Fanti in [place] 8/2  7/25- will call Dr Diona Fanti to see if can try voiding trial in rehab?  7/26- pt insisted he would go AMA or remove foley himself if wasn't removed-  is on voiding trial with PVR's q6 hours- if volumes >350cc after voiding, will need caths- will decrease risk of worsening prostatitis if this is done- retention not good for bladder/prostate. Of note, still passing blood clots/hematuria intermittently- will monitor 13. Hyponatremia  7/26- will recheck on Thursday since is new- first time not 135-140.     LOS: 4 days A FACE TO FACE EVALUATION WAS PERFORMED  Joseph Banks 02/03/2021, 8:59 AM

## 2021-02-03 NOTE — Progress Notes (Signed)
Nurse called this charge nurse to come and talk to pt. Pt is angry and adamant to having his catheter removed. Primary nurse had already spoken to provider on call for rehab. Pt threatening to remove foley by himself. Not willing to wait for urologist to be notified or primary provider in the am. Again primary nurse notified rehab provider on call. Order's rec'd.

## 2021-02-03 NOTE — Progress Notes (Signed)
Physical Therapy Session Note  Patient Details  Name: Joseph Banks MRN: [id number] Date of Birth: 1945-12-15  Today's Date: 02/03/2021 PT Individual Time: 1302-1331 PT Individual Time Calculation (min): 29 min   Short Term Goals: Week 1:  PT Short Term Goal 1 (Week 1): STG=LTG due to ELOS  Skilled Therapeutic Interventions/Progress Updates:    Pt received in recliner and agreeable to therapy.  No complaint of pain. [name] present. Discussed pt/wife's concerns from post-conference update. Educated on purpose of PVR and that pt should not avoid drinking water to avoid urinating/PVR. Pt and wife agreeable to discharge date after discussion. Session focused on introducing gait w/o an AD. Pt ambulated with RW x 150 ft CGA for "warm up" then walked with handrail 3 x 12 ft, progressing to no UE support. 2 bouts of 300 ft with CGA. Pt demoed incr hip/knee flexion in stance, poor foot clearance, and flat foot strike. Gait mechanics improved with verbal cues for heel strike and incr speed. Educated pt and wife to continue using RW when ambulating in room without staff present. Pt left seated in recliner with his wife present and all needs in reach.  Therapy Documentation Precautions:  Precautions Precautions: Fall Restrictions Weight Bearing Restrictions: No    Therapy/Group: Individual Therapy  Mickel Fuchs 02/03/2021, 1:42 PM

## 2021-02-04 LAB — GLUCOSE, CAPILLARY: Glucose-Capillary: 123 mg/dL — ABNORMAL HIGH (ref 70–99)

## 2021-02-04 MED ORDER — METHOCARBAMOL 500 MG PO TABS
500.0000 mg | ORAL_TABLET | Freq: Four times a day (QID) | ORAL | Status: DC | PRN
  Administered 2021-02-04: 500 mg via ORAL
  Filled 2021-02-04 (×2): qty 1

## 2021-02-04 MED ORDER — MELATONIN 5 MG PO TABS
5.0000 mg | ORAL_TABLET | Freq: Every day | ORAL | Status: DC
  Administered 2021-02-04: 5 mg via ORAL
  Filled 2021-02-04: qty 1

## 2021-02-04 MED ORDER — TAMSULOSIN HCL 0.4 MG PO CAPS
0.8000 mg | ORAL_CAPSULE | Freq: Every day | ORAL | Status: DC
  Administered 2021-02-05 – 2021-02-08 (×4): 0.8 mg via ORAL
  Filled 2021-02-04 (×5): qty 2

## 2021-02-04 NOTE — Progress Notes (Signed)
Speech Language Pathology Daily Session Note  Patient Details  Name: Joseph Banks MRN: [id number] Date of Birth: 07-20-1945  Today's Date: 02/04/2021 SLP Individual Time: 1050-1130 SLP Individual Time Calculation (min): 40 min  Short Term Goals: Week 1: SLP Short Term Goal 1 (Week 1): STG=LTG d/t ELOS  Skilled Therapeutic Interventions: Skilled SLP intervention focused on cognition. Pt completed working memory recall task with mod A using mental associations to increase accuracy. He answered problem solving questions related to medications with mod A visual cues using written medication list in room. Pt requested resources he can use to improve memory and "thinking". Pt given link to cogntive workbook on [place]. Cont with therapy per plan of care.      Pain Pain Assessment Pain Scale: Faces Pain Score: 0-No pain Faces Pain Scale: No hurt  Therapy/Group: Individual Therapy  Darrol Poke Dandra Shambaugh 02/04/2021, 11:39 AM

## 2021-02-04 NOTE — Progress Notes (Signed)
PROGRESS NOTE   Subjective/Complaints:   Pt has NOT required in/out cath, however only letting nursing do bladder scan 1x/shift- per nursing note-  Running ~ 200s- will increase Flomax.   Slept OK per pt- however wife said he's not sleeping well- and asked for [place] to help with sleep.  Also c/o pain- doesn't want to take tramadol usually- only taken once lately until this AM- explained that if pain limits therapy, suggest taking tramadol. Also asked for muscle relaxant= will order Robaxin prn.   LBM yesterday.  Pain overall better.  ROS:  Pt denies SOB, abd pain, CP, N/V/C/D, and vision changes      Objective:   No results found. Recent Labs    02/02/21 0712  WBC 7.1  HGB 10.3*  HCT 30.4*  PLT 409*   Recent Labs    02/02/21 0712  NA 129*  K 3.9  CL 99  CO2 20*  GLUCOSE 189*  BUN 12  CREATININE 0.86  CALCIUM 9.1    Intake/Output Summary (Last 24 hours) at 02/04/2021 0840 Last data filed at 02/04/2021 0813 Gross per 24 hour  Intake 916 ml  Output 2351 ml  Net -1435 ml        Physical Exam: Vital Signs Blood pressure 116/70, pulse 79, temperature 98.3 F (36.8 C), temperature source Oral, resp. rate 18, height '6\' 3"'$  (1.905 m), SpO2 100 %.      General: awake, alert, appropriate, sitting up in bedside chair; wife on speaker phone; kept repeating mt words for wife; NAD HENT: conjugate gaze; oropharynx moist CV: regular rate; no JVD Pulmonary: CTA B/L; no W/R/R- good air movement GI: soft, NT, ND, (+)BS; normoactive Psychiatric: appropriate; less irritable- brighter Neurological: alert- SLUMs 15/30 per SLP.   Neurologic: Cranial nerves II through XII intact, motor strength is 4/5 in bilateral deltoid, bicep, tricep, grip, hip flexor, knee extensors, ankle dorsiflexor and plantar flexor Sensory exam normal sensation to light touch and proprioception in bilateral upper and lower  extremities Cerebellar exam normal finger to nose to finger as well as heel to shin in bilateral upper and lower extremities Musculoskeletal: Full range of motion in all 4 extremities. No joint swelling   Assessment/Plan: 1. Functional deficits which require 3+ hours per day of interdisciplinary therapy in a comprehensive inpatient rehab setting. Physiatrist is providing close team supervision and 24 hour management of active medical problems listed below. Physiatrist and rehab team continue to assess barriers to discharge/monitor patient progress toward functional and medical goals  Care Tool:  Bathing    Body parts bathed by patient: Right arm, Left arm, Chest, Abdomen, Front perineal area, Buttocks, Right upper leg, Left upper leg, Face   Body parts bathed by helper: Right lower leg, Left lower leg     Bathing assist Assist Level: Minimal Assistance - Patient > 75%     Upper Body Dressing/Undressing Upper body dressing   What is the patient wearing?: Pull over shirt    Upper body assist Assist Level: Independent    Lower Body Dressing/Undressing Lower body dressing      What is the patient wearing?: Pants     Lower body assist Assist for lower  body dressing: Minimal Assistance - Patient > 75%     Chartered loss adjuster assist Assist for toileting: Minimal Assistance - Patient > 75%     Transfers Chair/bed transfer  Transfers assist     Chair/bed transfer assist level: Minimal Assistance - Patient > 75%     Locomotion Ambulation   Ambulation assist      Assist level: Contact Guard/Touching assist Assistive device: Walker-rolling Max distance: 300   Walk 10 feet activity   Assist     Assist level: Contact Guard/Touching assist Assistive device: Walker-rolling   Walk 50 feet activity   Assist    Assist level: Contact Guard/Touching assist Assistive device: Walker-rolling    Walk 150 feet activity   Assist    Assist  level: Contact Guard/Touching assist Assistive device: Walker-rolling    Walk 10 feet on uneven surface  activity   Assist     Assist level: Minimal Assistance - Patient > 75% Assistive device: Aeronautical engineer Will patient use wheelchair at discharge?: No Type of Wheelchair: Manual    Wheelchair assist level: Supervision/Verbal cueing Max wheelchair distance: 400 ft    Wheelchair 50 feet with 2 turns activity    Assist        Assist Level: Supervision/Verbal cueing   Wheelchair 150 feet activity     Assist      Assist Level: Supervision/Verbal cueing   Blood pressure 116/70, pulse 79, temperature 98.3 F (36.8 C), temperature source Oral, resp. rate 18, height '6\' 3"'$  (1.905 m), SpO2 100 %.     Medical Problem List and Plan: 1.  Debility secondary to acute prostatitis with severe sepsis and associated complications             -patient may  shower             -ELOS/Goals: 10-14 days, mod I to supervision with PT/OT  -con't PT and OT- pt doesn't appear to understand that can trigger prostatitis if not emptying bladder normally- attempted to explain. He did not voice understanding, in spite of wording it different ways.   -con't PT and OT and SLP 2.  Antithrombotics: -DVT/anticoagulation: SCDs             -antiplatelet therapy: 3. Pain Management: Neurontin 600 mg twice daily, Lidoderm patch as directed, tramadol 50 mg every 6 hours as needed  7/25- hasn't been taking tramadol for pain-asked pt to try before changing other meds. If doesn't work, will change meds around, increase things so can sleep/pain. 7/26- took tramadol last night; also will increase gabapentin to 600 mg TID for R hip and  feet nerve pain. 7/27- encouraged pt to take tramadol prn- will add Robaxin 500 mg q6 hours prn for muscle tightness.    4. Mood: Provide emotional support             -antipsychotic agents: N/A 5. Neuropsych: This patient is? capable of  making decisions on his own behalf. 6. Skin/Wound Care: Routine skin checks 7. Fluids/Electrolytes/Nutrition: Routine in and outs with follow-up chemistries 8.  Dysphagia.  Diet advanced to mechanical soft. 51.  Newly diagnosed transient atrial fibrillation RVR.  Felt to be related to sepsis.  Cardiac rate controlled.  Follow-up cardiology services from a distance 10.  Transaminitis.  LFTs normalizing.  Follow-up panel.  7/25- resolved 11.  Diabetes mellitus.  Glucophage 500 mg twice daily.  No SSI for now CBG (last 3)  No results for  input(s): GLUCAP in the last 72 hours.  7/26- last BG checked was 7/23- over weekend, they stopped BG checks.   12. Prostatitis: abx completed- hemorrhagic- some worsening hematuria yesterday after move to new room, need to keep foley tube secure ,no sig drop in Hgb, as per uro cont to irrigate foley , inform uro if there is increased clot formation or thick blood in tube             -continue proscar and flomax             -DO NOT REMOVE FOLEY- told pt to make appt with Dr Diona Fanti in [place] 8/2  7/25- will call Dr Diona Fanti to see if can try voiding trial in rehab?  7/26- pt insisted he would go AMA or remove foley himself if wasn't removed-  is on voiding trial with PVR's q6 hours- if volumes >350cc after voiding, will need caths- will decrease risk of worsening prostatitis if this is done- retention not good for bladder/prostate. Of note, still passing blood clots/hematuria intermittently- will monitor  7/27- urine clear per my exam and per nurse- will increase Flomax to 0.8 qsupper, since still retaining, but hasn't needed in/out cathing- refusing bladders scans 50% of time.  13. Hyponatremia  7/26- will recheck on Thursday since is new- first time not 135-140.  14. Insomnia  7/27- on Ambien prn, but will add Melatonin 5 mg QHS and con't prn Ambien-     LOS: 5 days A FACE TO FACE EVALUATION WAS PERFORMED  Ruthy Forry 02/04/2021, 8:40 AM

## 2021-02-04 NOTE — Progress Notes (Signed)
Physical Therapy Session Note  Patient Details  Name: Joseph Banks MRN: [id number] Date of Birth: 05-10-1946  Today's Date: 02/04/2021 PT Individual Time: 0800-0855 PT Individual Time Calculation (min): 55 min   Short Term Goals: Week 1:  PT Short Term Goal 1 (Week 1): STG=LTG due to ELOS  Skilled Therapeutic Interventions/Progress Updates:    Pt found standing to use urinal on arrival. Discussed need to not get up without staff present, pt agreed to use urinal at recliner when alone. Pt ambulated to gym with RW and supervision, including elevator. Pt performed exercise progression to improve balance and confidence on the stair including: step taps, step ups, double step taps, double step ups. Pt then navigated 6" steps 3x4 with CGA and 1 hand rail. Gait x 200 ft with CGA and no AD. Agility ladder x 6 with cues for increased arm swing. Pt then ambulated with CGA and no AD, demoed improved arm swing and normalized gait pattern x 200 ft. Pt climbed 3 flights of stairs with BUE support and CGA, then descended 1 flight in the same manner. Throughout session, pt demoed wide BOS and intermittent veering to the L during gait. Pt was aware of veering and could correct before running into obstacles. Pt returned to room and remained in recliner with all needs in reach at end of session.    Therapy Documentation Precautions:  Precautions Precautions: Fall Restrictions Weight Bearing Restrictions: No General:   Vital Signs: Therapy Vitals Temp: 98.3 F (36.8 C) Temp Source: Oral Pulse Rate: 79 Resp: 18 BP: 116/70 Patient Position (if appropriate): Sitting Oxygen Therapy SpO2: 100 % O2 Device: Room Air Pain: Pain Assessment Pain Scale: 0-10 Pain Score: 0-No pain Faces Pain Scale: No hurt Pain Type: Acute pain Pain Location: Back Pain Descriptors / Indicators: Aching;Discomfort Pain Frequency: Intermittent Pain Onset: On-going Patients Stated Pain Goal: 1 Pain Intervention(s):  Medication (See eMAR) Mobility:   Locomotion :    Trunk/Postural Assessment :    Balance:   Exercises:   Other Treatments:      Therapy/Group: Individual Therapy  Mickel Fuchs 02/04/2021, 8:58 AM

## 2021-02-04 NOTE — Progress Notes (Addendum)
Patient said, he does not want bladder scan more then one time a shift and don't want I&O cath either.He was arguing and saying that I am urinating good amount and why do I need bladder scan.This nurse educated the patient about post void urinary retention and risk factor of that, then he let me do bladder scan one time. Please see flow sheet for intake and output and bladder scan record.

## 2021-02-04 NOTE — Progress Notes (Signed)
Occupational Therapy Session Note  Patient Details  Name: Joseph Banks MRN: [id number] Date of Birth: 03/09/1946  Today's Date: 02/04/2021 OT Individual Time: 1130-1156 and 1445-1526 OT Individual Time Calculation (min): 26 min and 41 min   Short Term Goals: Week 1:  OT Short Term Goal 1 (Week 1): STG=LTG d/t ELOS   Skilled Therapeutic Interventions/Progress Updates:    Pt greeted at time of session sitting up in recliner with wife present in room, stayed for remainder of session. Declined ADL, discussion at beginning of session regarding voiding and pt able to recall method for recording mL at night and putting in his phone when using urinal at night. Focus of session on dynamic standing balance during functional mobility in the hall, switching sides of hall in zig zag pattern with no LOB and mild trunk sway as he did this without AD. Continued dynamic standing balance training with ambulating stop/go pattern to improve reaction speed and righting reactions, again with no AD. Partial lunges 2x5 close to wall if needed and cues for form for LB strengthening and overall endurance training. Back in room set up in recliner with wife present who has been checked off on walking the pt, all needs met.   Session 2: Pt greeted at time of session sitting up in recliner agreeable to OT session, wife left for the day. No pain. Pt ambulated room <> therapy gym on 4th floor riding elevator with RW, close supervision throughout. In gym, transferred to/from therapy mat Supervision/CGA multiple times without AD. Focus of session on obstacle course placing horseshoes around gym x2 trials having to locate, gather, and hold. Pt recalling squatting technique instead of bending back for lower objects, and did have one large LOB with Mod A to correct after pt "ran into" large curb step in gym stating he thought it was the floor. Remainder of task performed with CGA/close supervision no AD. Ambulated back to room same  as above, back in chair alarm on call bell in reach.   Therapy Documentation Precautions:  Precautions Precautions: Fall Restrictions Weight Bearing Restrictions: No     Therapy/Group: Individual Therapy  Viona Gilmore 02/04/2021, 7:20 AM

## 2021-02-05 LAB — CBC WITH DIFFERENTIAL/PLATELET
Abs Immature Granulocytes: 0.01 10*3/uL (ref 0.00–0.07)
Basophils Absolute: 0 10*3/uL (ref 0.0–0.1)
Basophils Relative: 1 %
Eosinophils Absolute: 0.2 10*3/uL (ref 0.0–0.5)
Eosinophils Relative: 3 %
HCT: 32.5 % — ABNORMAL LOW (ref 39.0–52.0)
Hemoglobin: 10.9 g/dL — ABNORMAL LOW (ref 13.0–17.0)
Immature Granulocytes: 0 %
Lymphocytes Relative: 50 %
Lymphs Abs: 2.5 10*3/uL (ref 0.7–4.0)
MCH: 31.1 pg (ref 26.0–34.0)
MCHC: 33.5 g/dL (ref 30.0–36.0)
MCV: 92.9 fL (ref 80.0–100.0)
Monocytes Absolute: 0.6 10*3/uL (ref 0.1–1.0)
Monocytes Relative: 12 %
Neutro Abs: 1.7 10*3/uL (ref 1.7–7.7)
Neutrophils Relative %: 34 %
Platelets: 470 10*3/uL — ABNORMAL HIGH (ref 150–400)
RBC: 3.5 MIL/uL — ABNORMAL LOW (ref 4.22–5.81)
RDW: 15.3 % (ref 11.5–15.5)
WBC: 5 10*3/uL (ref 4.0–10.5)
nRBC: 0 % (ref 0.0–0.2)

## 2021-02-05 LAB — BASIC METABOLIC PANEL
Anion gap: 7 (ref 5–15)
BUN: 16 mg/dL (ref 8–23)
CO2: 24 mmol/L (ref 22–32)
Calcium: 9.7 mg/dL (ref 8.9–10.3)
Chloride: 103 mmol/L (ref 98–111)
Creatinine, Ser: 1.01 mg/dL (ref 0.61–1.24)
GFR, Estimated: >60 mL/min (ref >60)
Glucose, Bld: 126 mg/dL — ABNORMAL HIGH (ref 70–99)
Potassium: 4.3 mmol/L (ref 3.5–5.1)
Sodium: 134 mmol/L — ABNORMAL LOW (ref 135–145)

## 2021-02-05 MED ORDER — ZOLPIDEM TARTRATE 5 MG PO TABS
5.0000 mg | ORAL_TABLET | Freq: Every day | ORAL | Status: DC
  Administered 2021-02-05 – 2021-02-08 (×4): 5 mg via ORAL
  Filled 2021-02-05 (×4): qty 1

## 2021-02-05 MED ORDER — MELATONIN 5 MG PO TABS
5.0000 mg | ORAL_TABLET | Freq: Every day | ORAL | Status: DC
  Administered 2021-02-05 – 2021-02-08 (×4): 5 mg via ORAL
  Filled 2021-02-05 (×4): qty 1

## 2021-02-05 MED ORDER — SENNA 8.6 MG PO TABS
1.0000 | ORAL_TABLET | Freq: Every day | ORAL | Status: DC
  Administered 2021-02-05 – 2021-02-09 (×5): 8.6 mg via ORAL
  Filled 2021-02-05 (×5): qty 1

## 2021-02-05 NOTE — Progress Notes (Signed)
Occupational Therapy Discharge Summary  Patient Details  Name: Joseph Banks MRN: [id number] Date of Birth: 12/29/1945  Patient has met 9 of 9 long term goals due to improved activity tolerance, improved balance, postural control, ability to compensate for deficits, improved awareness, and improved coordination.  Patient to discharge at overall Mod I - Supervision level.  Patient's care partner is independent to provide the necessary physical and cognitive assistance at discharge.  Pt is overall Mod I with familiar tasks such as toileting and ambulating to/from short household distances for ADLs, but does need Supervision for shower transfers and showering d/t decreased cognition and safety. Supervision with simple meal prep and laundry tasks. Note wife has been present for several sessions and prepared to take pt home to provide Supervision when needed. Pt has been limited at times during his stay d/t cognition and decreased safety at times. Pt walking at times without AD and when fatigued can use RW.   Reasons goals not met: n/a  Recommendation:  No follow up needed.   Equipment: Wife to purchase shower seat, grab bars.   Reasons for discharge: treatment goals met and discharge from hospital  Patient/family agrees with progress made and goals achieved: Yes  OT Discharge Precautions/Restrictions  Precautions Precautions: Fall Restrictions Weight Bearing Restrictions: No Vital Signs Therapy Vitals Temp: 97.8 F (36.6 C) Pulse Rate: 87 Resp: 18 BP: 122/67 Patient Position (if appropriate): Sitting Oxygen Therapy SpO2: 99 % O2 Device: Room Air Pain Pain Assessment Pain Scale: 0-10 Pain Score: 0-No pain ADL ADL Eating: independent Grooming: Modified independent Where Assessed-Grooming: Chair Upper Body Bathing: Setup (simulated) Where Assessed-Upper Body Bathing: Shower Lower Body Bathing: Supervision/safety (simulated) Where Assessed-Lower Body Bathing: Shower Upper  Body Dressing: Modified independent (Device) Where Assessed-Upper Body Dressing: Chair Lower Body Dressing: mod I Where Assessed-Lower Body Dressing: Chair Toileting: Modified independent Where Assessed-Toileting: Bedside Commode Toilet Transfer: Modified independent Science writer: toilet Social research officer, government: Close supervision Social research officer, government Method: Ambulating Vision Baseline Vision/History: Wears glasses Wears Glasses: Distance only Patient Visual Report: No change from baseline Vision Assessment?: No apparent visual deficits Perception  Perception: Within Functional Limits Praxis Praxis: Intact Cognition Overall Cognitive Status: Impaired/Different from baseline Arousal/Alertness: Awake/alert Memory: Impaired Awareness: Impaired Problem Solving: Impaired Sensation Sensation Light Touch: Impaired by gross assessment Coordination Gross Motor Movements are Fluid and Coordinated: No Fine Motor Movements are Fluid and Coordinated: No Coordination and Movement Description: limited due to generalized weakness but greatly improved since eval Motor  Motor Motor: Within Functional Limits Motor - Skilled Clinical Observations: generalized weakness but functional for familiar ADL tasks Mobility  Transfers Sit to Stand: Independent with assistive device Stand to Sit: Independent with assistive device  Trunk/Postural Assessment  Cervical Assessment Cervical Assessment: Exceptions to [facility] Thoracic Assessment Thoracic Assessment: Exceptions to [facility] Lumbar Assessment Lumbar Assessment: Exceptions to [facility] Postural Control Postural Control: Deficits on evaluation Postural Limitations: mild trunk sway during standing activities but greatly improved  Balance Balance Balance Assessed: Yes Dynamic Sitting Balance Dynamic Sitting - Balance Support: Feet supported Dynamic Sitting - Level of Assistance: 6: Modified independent (Device/Increase time) Dynamic  Standing Balance Dynamic Standing - Balance Support: During functional activity Dynamic Standing - Level of Assistance: 6: Modified independent (Device/Increase time) Dynamic Standing - Comments: dynamic standing Mod I except with showering/shower transfers Extremity/Trunk Assessment RUE Assessment RUE Assessment: Within Functional Limits LUE Assessment LUE Assessment: Within Functional Limits   Viona Gilmore 02/05/2021, 4:56 PM

## 2021-02-05 NOTE — Progress Notes (Signed)
Occupational Therapy Session Note  Patient Details  Name: CALEEL JUNGERS MRN: [id number] Date of Birth: [birth year]  Today's Date: 02/05/2021 OT Individual Time: 1002-1059 OT Individual Time Calculation (min): 57 min    Short Term Goals: Week 1:  OT Short Term Goal 1 (Week 1): STG=LTG d/t ELOS   Skilled Therapeutic Interventions/Progress Updates:    Pt greeted at time of session sitting up in recliner agreeable to OT session no pain reported. Pt ambulating throughout room to brush teeth standing with Supervision no AD. Declined shower today. Ambulating room > ADL apartment with no AD including riding elevator down to 4th floor to ADL apartment. Focused on walk in shower transfers with both anterior entry using grab bar and step over method and posterior method with RW for more stability x2 each method for 4 trials total. Pt transferring to/from low sofa as well multiple times with Supervision overall for all transfers. Pt ambulating ADL apartment > gym no AD Supervision and performing in standing 2x15-20 of the following with 5# bicep curl, chest press, overhead press with seated rest break in between sets. 1 standing round of BITS dynamic standing with sequencing #s 1-26 and letters A-Z needing Min cues approx half way through for orientation. Pt in ADL apartment hand off for OT group.   Therapy Documentation Precautions:  Precautions Precautions: Fall Restrictions Weight Bearing Restrictions: No     Therapy/Group: Individual Therapy  Viona Gilmore 02/05/2021, 7:16 AM

## 2021-02-05 NOTE — Progress Notes (Signed)
Occupational Therapy Session Note  Patient Details  Name: Joseph Banks MRN: [id number] Date of Birth: 11-04-1945  Today's Date: 02/05/2021 OT Group Time: 1100-1200 OT Group Time Calculation (min): 60 min   Short Term Goals: Week 1:  OT Short Term Goal 1 (Week 1): STG=LTG d/t ELOS  Skilled Therapeutic Interventions/Progress Updates:  Pt was seen for skilled group session with focus of group session on kitchen safety/ home management. Pt interactive and attentive during session asking appropriate questions related to energy conservation, home set- up and fall prevention strategies. Education and handout provided on energy conservation strategies for home with pt receptive and appreciative of all education. Pt asking appropriate questions about walker bags, able to distribute walker bag to pt and provided education on using walker bag to prevent falls and as energy conservation strategy.  Additionally demonstrated low tech option of using draw string bag on RW as needed to transport items. Discussed how to get out of floor if a fall were to occur with pt verbalizing understanding. Pt reports he doesn't do much in the kitchen but reports he would like to be able to make eggs/ spaghetti in kitchen, discussed how to effectively maneuver RW in kitchen and how to utilize counter space appropriately during cooking tasks. Education provided on tips and strategies for completing functional mobility in community,pt asking appropriate questions about safety when mobilizing in grocery store. Pt ambulated back to room with assistance from RT.    Therapy Documentation Precautions:  Precautions Precautions: Fall Restrictions Weight Bearing Restrictions: No  Pain: Pt did not report any pain during session.     Therapy/Group: Group Therapy  Precious Haws 02/05/2021, 3:35 PM

## 2021-02-05 NOTE — Progress Notes (Signed)
Physical Therapy Session Note  Patient Details  Name: Joseph Banks MRN: [id number] Date of Birth: 04-08-1946  Today's Date: 02/05/2021 PT Individual Time: 0900-0930 PT Individual Time Calculation (min): 30 min   Short Term Goals: Week 1:  PT Short Term Goal 1 (Week 1): STG=LTG due to ELOS  Skilled Therapeutic Interventions/Progress Updates:    Pt received in recliner and agreeable to therapy.  No complaint of pain. Discussed d/c plan, including OPPT recommendations, community integration, and home set up. Pt donned shoes mod I. Session completed w/o AD for improvements in balance and confidence. Ambulated to/from stairwell with supervision, x 200 ft. Pt descended stair x 2 floors with 1 hand rail, then ascended in the same manner. Practiced bottom step with no UE support to simulate garage steps in home environment. Pt returned to room after session and was left with all needs in reach.   Therapy Documentation Precautions:  Precautions Precautions: Fall Restrictions Weight Bearing Restrictions: No     Therapy/Group: Individual Therapy  Mickel Fuchs 02/05/2021, 4:22 PM

## 2021-02-05 NOTE — Progress Notes (Signed)
Speech Language Pathology Daily Session Note  Patient Details  Name: Joseph Banks MRN: [id number] Date of Birth: 01-27-1946  Today's Date: 02/05/2021 SLP Individual Time: [id number] SLP Individual Time Calculation (min): 45 min  Short Term Goals: Week 1: SLP Short Term Goal 1 (Week 1): STG=LTG d/t ELOS  Skilled Therapeutic Interventions:   Patient seen for skilled ST session focusing on cognitive function goals. Patient was alert, sitting up in recliner and very receptive towards therapy session. SLP introduced deduction/logic puzzle with patient reporting he has not done such an activity before. After verbal instruction and modeling, patient able to perform this mildly complex task initially with min-modA but fading to minA as task progressed. He also improved in speed and efficiency. SLP left another deduction/logic puzzle and instructed patient to attempt on his own and to use strategies used during today's session. Patient in agreement and feels that he would likely need to complete this activity in chunks throughout the day. Patient demonstrated good recall of previous therapy sessions and recommendations. He continues to benefit from skilled SLP intervention to maximize cognitive function prior to discharge.  Pain Pain Assessment Pain Scale: 0-10 Pain Score: 0-No pain  Therapy/Group: Individual Therapy  Sonia Baller, MA, CCC-SLP Speech Therapy

## 2021-02-05 NOTE — Progress Notes (Signed)
PROGRESS NOTE   Subjective/Complaints:   Pt reports didn't sleep well, however felt like meds really started to work-  Sat up and read all night per pt. Will try and switch times of sleeping meds.    Is voiding well- bladder scans ~ 100cc.  LBM 2 days ago- on 7/26- medium- will add 1 senokot daily  ROS:  Pt denies SOB, abd pain, CP, N/V/C/D, and vision changes     Objective:   No results found. Recent Labs    02/05/21 0539  WBC 5.0  HGB 10.9*  HCT 32.5*  PLT 470*   Recent Labs    02/05/21 0539  NA 134*  K 4.3  CL 103  CO2 24  GLUCOSE 126*  BUN 16  CREATININE 1.01  CALCIUM 9.7    Intake/Output Summary (Last 24 hours) at 02/05/2021 1057 Last data filed at 02/05/2021 0732 Gross per 24 hour  Intake 600 ml  Output 1350 ml  Net -750 ml        Physical Exam: Vital Signs Blood pressure 116/73, pulse 99, temperature 98.7 F (37.1 C), temperature source Oral, resp. rate 16, height '6\' 3"'$  (1.905 m), weight 98.7 kg, SpO2 100 %.       General: awake, alert, appropriate,  sitting up in bedside chair; NAD HENT: conjugate gaze; oropharynx moist CV: regular rate but borderline tachycardic; no JVD Pulmonary: CTA B/L; no W/R/R- good air movement GI: soft, NT, ND, (+)BS- hypoactive- not distended Psychiatric: appropriate Neurological: alert- SLUMs 15/30 per SLP  Neurologic: Cranial nerves II through XII intact, motor strength is 4/5 in bilateral deltoid, bicep, tricep, grip, hip flexor, knee extensors, ankle dorsiflexor and plantar flexor Sensory exam normal sensation to light touch and proprioception in bilateral upper and lower extremities Cerebellar exam normal finger to nose to finger as well as heel to shin in bilateral upper and lower extremities Musculoskeletal: Full range of motion in all 4 extremities. No joint swelling   Assessment/Plan: 1. Functional deficits which require 3+ hours per day of  interdisciplinary therapy in a comprehensive inpatient rehab setting. Physiatrist is providing close team supervision and 24 hour management of active medical problems listed below. Physiatrist and rehab team continue to assess barriers to discharge/monitor patient progress toward functional and medical goals  Care Tool:  Bathing    Body parts bathed by patient: Right arm, Left arm, Chest, Abdomen, Front perineal area, Buttocks, Right upper leg, Left upper leg, Face   Body parts bathed by helper: Right lower leg, Left lower leg     Bathing assist Assist Level: Minimal Assistance - Patient > 75%     Upper Body Dressing/Undressing Upper body dressing   What is the patient wearing?: Pull over shirt    Upper body assist Assist Level: Independent    Lower Body Dressing/Undressing Lower body dressing      What is the patient wearing?: Pants     Lower body assist Assist for lower body dressing: Minimal Assistance - Patient > 75%     Toileting Toileting    Toileting assist Assist for toileting: Minimal Assistance - Patient > 75%     Transfers Chair/bed transfer  Transfers assist  Chair/bed transfer assist level: Minimal Assistance - Patient > 75%     Locomotion Ambulation   Ambulation assist      Assist level: Contact Guard/Touching assist Assistive device: Walker-rolling Max distance: 300   Walk 10 feet activity   Assist     Assist level: Contact Guard/Touching assist Assistive device: Walker-rolling   Walk 50 feet activity   Assist    Assist level: Contact Guard/Touching assist Assistive device: Walker-rolling    Walk 150 feet activity   Assist    Assist level: Contact Guard/Touching assist Assistive device: Walker-rolling    Walk 10 feet on uneven surface  activity   Assist     Assist level: Minimal Assistance - Patient > 75% Assistive device: Aeronautical engineer Will patient use wheelchair at  discharge?: No Type of Wheelchair: Manual    Wheelchair assist level: Supervision/Verbal cueing Max wheelchair distance: 400 ft    Wheelchair 50 feet with 2 turns activity    Assist        Assist Level: Supervision/Verbal cueing   Wheelchair 150 feet activity     Assist      Assist Level: Supervision/Verbal cueing   Blood pressure 116/73, pulse 99, temperature 98.7 F (37.1 C), temperature source Oral, resp. rate 16, height '6\' 3"'$  (1.905 m), weight 98.7 kg, SpO2 100 %.     Medical Problem List and Plan: 1.  Debility secondary to acute prostatitis with severe sepsis and associated complications             -patient may  shower             -ELOS/Goals: 10-14 days, mod I to supervision with PT/OT  -con't PT and OT- pt doesn't appear to understand that can trigger prostatitis if not emptying bladder normally- attempted to explain. He did not voice understanding, in spite of wording it different ways.   Continue CIR- PT, OT and SLP  2.  Antithrombotics: -DVT/anticoagulation: SCDs             -antiplatelet therapy: 3. Pain Management: Neurontin 600 mg twice daily, Lidoderm patch as directed, tramadol 50 mg every 6 hours as needed  7/25- hasn't been taking tramadol for pain-asked pt to try before changing other meds. If doesn't work, will change meds around, increase things so can sleep/pain. 7/26- took tramadol last night; also will increase gabapentin to 600 mg TID for R hip and  feet nerve pain. 7/27- encouraged pt to take tramadol prn- will add Robaxin 500 mg q6 hours prn for muscle tightness.   7/28- less pain this AM- con't regimen  4. Mood: Provide emotional support             -antipsychotic agents: N/A 5. Neuropsych: This patient is? capable of making decisions on his own behalf. 6. Skin/Wound Care: Routine skin checks 7. Fluids/Electrolytes/Nutrition: Routine in and outs with follow-up chemistries 8.  Dysphagia.  Diet advanced to mechanical soft. 22.  Newly  diagnosed transient atrial fibrillation RVR.  Felt to be related to sepsis.  Cardiac rate controlled.  Follow-up cardiology services from a distance 10.  Transaminitis.  LFTs normalizing.  Follow-up panel.  7/25- resolved 11.  Diabetes mellitus.  Glucophage 500 mg twice daily.  No SSI for now CBG (last 3)  Recent Labs    02/04/21 1618  GLUCAP 123*    7/26- last BG checked was 7/23- over weekend, they stopped BG checks.   7/28- BG 123- done yesterday- will stop  CBGs  12. Prostatitis: abx completed- hemorrhagic- some worsening hematuria yesterday after move to new room, need to keep foley tube secure ,no sig drop in Hgb, as per uro cont to irrigate foley , inform uro if there is increased clot formation or thick blood in tube             -continue proscar and flomax             -DO NOT REMOVE FOLEY- told pt to make appt with Dr Diona Fanti in [place] 8/2  7/25- will call Dr Diona Fanti to see if can try voiding trial in rehab?  7/26- pt insisted he would go AMA or remove foley himself if wasn't removed-  is on voiding trial with PVR's q6 hours- if volumes >350cc after voiding, will need caths- will decrease risk of worsening prostatitis if this is done- retention not good for bladder/prostate. Of note, still passing blood clots/hematuria intermittently- will monitor  7/27- urine clear per my exam and per nurse- will increase Flomax to 0.8 qsupper, since still retaining, but hasn't needed in/out cathing- refusing bladders scans 50% of time.   7/28- doing much better- volumes ~ 100cc with bladder scans- no in/out caths required- con't regimen for now 13. Hyponatremia  7/26- will recheck on Thursday since is new- first time not 135-140.  14. Insomnia  7/27- on Ambien prn, but will add Melatonin 5 mg QHS and con't prn Ambien-   7/28- will change ambien to scheduled and give with melatonin at 8pm to help-     LOS: 6 days A FACE TO FACE EVALUATION WAS PERFORMED  Raywood Wailes 02/05/2021,  10:57 AM

## 2021-02-05 NOTE — Progress Notes (Signed)
Inpatient Rehabilitation Care Coordinator Discharge Note  The overall goal for the admission was met for:   Discharge location: Yes-HOME WITH WIFE WHO CAN PROVIDE SUPERVISION LEVEL  Length of Stay: Yes-10 DAYS  Discharge activity level: Yes-SUPERVISION LEVEL  Home/community participation: Yes  Services provided included: MD, RD, PT, OT, SLP, RN, CM, TR, Pharmacy, and SW  Financial Services: Private Insurance: [employer] offered to/list presented to:PT AND WIFE  Follow-up services arranged: Outpatient: [facility]-PT & SP WILL CALL WIFE TO SET UP APPOINTMENTS  Comments (or additional information):  Patient/Family verbalized understanding of follow-up arrangements: Yes  Individual responsible for coordination of the follow-up plan: SHIRLEY-WIFE [phone]  Confirmed correct DME delivered: Elease Hashimoto 02/05/2021    Quantay Zaremba, [place]

## 2021-02-05 NOTE — Progress Notes (Signed)
Patient ID: Joseph Banks, male   DOB: 1946/04/04, 75 y.o.   MRN: [id number]  Met with pt and wife to discuss OP services they report [facility] closer to them and would like to go there for OP rehab, aware will be PT and SP. Will make referral to [facility] since they are the only one who has both PT and SP. Will ask to contact wife to set up appointments. Work toward discharge Monday.

## 2021-02-05 NOTE — Evaluation (Signed)
Recreational Therapy Assessment and Plan  Patient Details  Name: Joseph Banks MRN: [id number] Date of Birth: [birth year] Today's Date: 02/05/2021  Rehab Potential:  Good ELOS:   d/c 8/1  Assessment  Hospital Problem: Principal Problem:   Debility Active Problems:   Sepsis ([place])     Past Medical History:      Past Medical History:  Diagnosis Date   BPH (benign prostatic hyperplasia)     Diabetes mellitus without complication ([place])      Past Surgical History:       Past Surgical History:  Procedure Laterality Date   IR FLUORO GUIDED NEEDLE PLC ASPIRATION/INJECTION LOC   01/16/2021   TRANSURETHRAL RESECTION OF PROSTATE          Assessment & Plan Clinical Impression: 75yo with a history of DM who had recently been diagnosed with prostatitis treated with ceftriaxone and bactrim but continued to experience worsening perianal pain with fever as high as 104. He was therefore changed to ciprofloxacin but symptoms persisted. CT at time of repeat presentation suggested a persistent bladder mass and enlarging prostate.     Significant Events:  Since admission the patient has been treated with antibiotic under the direction of the ID service.  His stay has been complicated by the development of new atrial fibrillation with RVR requiring medication titration.  He remained encephalopathic for an extended period of time before slowly regaining his cognition.  6/16 seen by Urology - UTI sx - rocephin IM + bactrim po  6/30 [facility] ED worsening pain/fever - CT abdom - cipro po  7/4 admit via [facility] > [facility] - persisting fevers/pain  7/4 transfer to ICU due to sepsis - encephalopathy  7/7 PICC line placement  7/8 LP under conscious sedation  7/13 MRI brain - no acute findingsPatient transferred to CIR on 01/30/2021 .  Pt referred for TR services by team addressing kitchen/home safety and stress management. Pt presents with decreased activity tolerance, decreased functional mobility, decreased balance,  decreased attention, decreased awareness, decreased problem solving, decreased safety awareness, and decreased memory Limiting pt's independence with leisure/community pursuits. Plan  Min 1 TR session >20 minutes during LOS  Recommendations for other services: None   Discharge Criteria: Patient will be discharged from TR if patient refuses treatment 3 consecutive times without medical reason.  If treatment goals not met, if there is a change in medical status, if patient makes no progress towards goals or if patient is discharged from hospital.  The above assessment, treatment plan, treatment alternatives and goals were discussed and mutually agreed upon: by patient  Session Note: Time:  11-12 Pain:  no c/o  Pt participated in kitchen/home safety group today with extensive discussion/education on energy conservation, identification of fall hazards, fall recovery, home management tasks identifying modifications to reduce fall hazards, household ambulation with RW,  safely transporting items ambulatory level with use the use of a walker bag & community reintegration.    [place] 02/05/2021, 3:48 PM

## 2021-02-06 NOTE — Progress Notes (Signed)
Occupational Therapy Session Note  Patient Details  Name: Joseph Banks MRN: [id number] Date of Birth: [birth year]  Today's Date: 02/06/2021 OT Group Time: 1100-1200 OT Group Time Calculation (min): 60 min   Short Term Goals: Week 1:  OT Short Term Goal 1 (Week 1): STG=LTG d/t ELOS  Skilled Therapeutic Interventions/Progress Updates:  Pt was seen for skilled group session with focus of group session on stress management, coping strategies and social engagement. Education provided on factors that contribute to stress such as daily hassles, major life stressors, and life circumstance. Pt participated by sharing major and daily life stressors with group, pt reports difficulties with sleeping in hospital. Education provided on healthy coping strategies to implement into pts routine to manage stressors, pt participated by sharing healthy coping strategies of sharing daily uplifts such as "his approaching DC date" Provided pt with handouts to increase carryover of tips for managing stressors, pt ambulated back to room with supervision of RT.   Therapy Documentation Precautions:  Precautions Precautions: Fall Restrictions Weight Bearing Restrictions: No General:   Vital Signs: Therapy Vitals Temp: 97.9 F (36.6 C) Temp Source: Oral Pulse Rate: 83 Resp: 18 BP: 126/68 Patient Position (if appropriate): Sitting Oxygen Therapy SpO2: 100 % O2 Device: Room Air Pain: Pt reports no pain during session.   Therapy/Group: Group Therapy  Precious Haws 02/06/2021, 1:46 PM

## 2021-02-06 NOTE — Progress Notes (Signed)
Physical Therapy Discharge Summary  Patient Details  Name: Joseph Banks MRN: [id number] Date of Birth: [birth year]     Patient has met 11 of 11 long term goals due to improved activity tolerance, improved balance, increased strength, decreased pain, and improved coordination.  Patient to discharge at an ambulatory level Supervision.   Patient's care partner is independent to provide the necessary physical assistance at discharge. Pt ambulating with RW mod I and without AD with supervision over 500 ft. Pt navigating stairs with 1 hand rail CGA, up to 2 floors. Ramp, uneven surface, curb, and backward/side stepping with CGA. Floor transfers with supervision, pt educated on injury assessment and calling for help in case of a fall. Family education not completed d/t availability.  Reasons goals not met: N/A  Recommendation:  Patient will benefit from ongoing skilled PT services in outpatient setting to continue to advance safe functional mobility, address ongoing impairments in balance, endurance, and minimize fall risk.  Equipment: Pt already owned [place]  Reasons for discharge: treatment goals met and discharge from hospital  Patient/family agrees with progress made and goals achieved: Yes  PT Discharge Precautions/Restrictions Precautions Precautions: Fall Restrictions Weight Bearing Restrictions: No Vital Signs   Pain Pain Assessment Pain Scale: Faces Pain Score: 0-No pain Vision/Perception  Perception Perception: Within Functional Limits Praxis Praxis: Intact  Cognition Overall Cognitive Status: Impaired/Different from baseline Arousal/Alertness: Awake/alert Orientation Level: Oriented X4 Memory: Impaired Memory Impairment: Decreased recall of new information;Decreased short term memory Awareness Impairment: Emergent impairment Problem Solving: Impaired Safety/Judgment: Appears intact Sensation   Motor  Motor Motor: Within Functional Limits Motor - Skilled  Clinical Observations: Generalized weakness, noted instability in L knee 2/2 teenage injury Motor - Discharge Observations: Signifigantly improved from baseline  Mobility Bed Mobility Bed Mobility: Rolling Right;Rolling Left;Right Sidelying to Sit;Sit to Supine Rolling Right: Independent with assistive device Rolling Left: Independent with assistive device Right Sidelying to Sit: Independent with assistive device Sit to Supine: Independent with assistive device Transfers Transfers: Sit to Stand;Stand to Sit;Stand Pivot Transfers Sit to Stand: Independent with assistive device Stand to Sit: Independent with assistive device Stand Pivot Transfers: Independent with assistive device Transfer (Assistive device): Rolling walker Locomotion  Gait Ambulation: Yes Gait Distance (Feet): 500 Feet Assistive device: Rolling walker Gait Gait: Yes Gait Pattern: Step-through pattern;Decreased stride length;Trunk flexed;Wide base of support Gait velocity: decreased High Level Ambulation High Level Ambulation: Side stepping;Backwards walking Stairs / Additional Locomotion Stairs: Yes Stairs Assistance: Contact Guard/Touching assist Stair Management Technique: One rail Right Number of Stairs: 18 Height of Stairs: 7 Ramp: Supervision/Verbal cueing Curb: Supervision/Verbal cueing Wheelchair Mobility Wheelchair Mobility: No  Trunk/Postural Assessment  Cervical Assessment Cervical Assessment: Within Functional Limits Thoracic Assessment Thoracic Assessment: Within Functional Limits Lumbar Assessment Lumbar Assessment: Within Functional Limits Postural Control Postural Control: Deficits on evaluation Postural Limitations: mild trunk sway during standing activities but greatly improved  Balance Balance Balance Assessed: Yes Dynamic Sitting Balance Dynamic Sitting - Balance Support: Feet supported Dynamic Sitting - Level of Assistance: 6: Modified independent (Device/Increase time) Dynamic  Standing Balance Dynamic Standing - Balance Support: During functional activity Dynamic Standing - Level of Assistance: 6: Modified independent (Device/Increase time) Extremity Assessment      RLE Assessment RLE Assessment: Within Functional Limits General Strength Comments: Mild weakness but WFL LLE Assessment LLE Assessment: Within Functional Limits General Strength Comments: mild Weakness but WFL, some instability at L knee d/t old injury    Mickel Fuchs 02/06/2021, 5:53 PM

## 2021-02-06 NOTE — Progress Notes (Signed)
Recreational Therapy Session Note  Patient Details  Name: Joseph Banks MRN: [id number] Date of Birth: 02-10-1946 Today's Date: 02/06/2021 Time:  11-12 Pain: no c/o Skilled Therapeutic Interventions/Progress Updates: Pt referred by team for participation in Stress Management/Relaxation Training group with LRT & COTA co-facilitating discussion on stress exploration.  Discussion/education included identifying stressors, categorizing them, & identifying potential coping strategies.  Strategies included deep breathing exercise, progressive muscle relaxation, challenging irrational thoughts & imagery.  Pt participated in group discussion, took notes and provided with education materials for in room and home use.  Therapy/Group: Group Therapy  Alya Smaltz 02/06/2021, 12:39 PM

## 2021-02-06 NOTE — Progress Notes (Signed)
Physical Therapy Session Note  Patient Details  Name: Joseph Banks MRN: [id number] Date of Birth: Nov 30, 1945  Today's Date: 02/06/2021 PT Individual Time: 1000-1100 PT Individual Time Calculation (min): 60 min   Short Term Goals: Week 1:  PT Short Term Goal 1 (Week 1): STG=LTG due to ELOS  Skilled Therapeutic Interventions/Progress Updates:    Pt received in recliner and agreeable to therapy.  No complaint of pain. Session focused on d/c planning, including pt education on appropriate DME use. Pt ambulated throughout session with RW, occ without for DME training. Gait x 200 ft to stair well, pt then descended stairs to 4th floor with 1 hand rail and CGA. Pt was instructed on and performed floor transfer x 3, including injury assessment and instruction to call for help to supervise floor transfer. Pt then navigated 8" curb step x 3 with RW and supervision. Pt practiced using RW in tight spaces/different directions by ambulating forward/side/back around cones in 6 ft square, 2x2 laps. Pt navigated ramp, uneven surface, and curb step x 3 with RW and supervision. Pt bent to pickup pen from floor with supervision and RW for balance. Pt instructed on supine bridge, SLR, and BKFO in preparation for HEP. Pt then handed off for group session with RT and COTA, pt in good condition at end of session.   Therapy Documentation Precautions:  Precautions Precautions: Fall Restrictions Weight Bearing Restrictions: No   Therapy/Group: Individual Therapy  Mickel Fuchs 02/06/2021, 5:28 PM

## 2021-02-06 NOTE — Progress Notes (Signed)
Speech Language Pathology Daily Session Note  Patient Details  Name: Joseph Banks MRN: [id number] Date of Birth: 11/19/1945  Today's Date: 02/06/2021 SLP Individual Time: 1345-1430 SLP Individual Time Calculation (min): 45 min  Short Term Goals: Week 1: SLP Short Term Goal 1 (Week 1): STG=LTG d/t ELOS  Skilled Therapeutic Interventions: Patient agreeable to skilled ST intervention with focus on cognitive goals. SLP facilitated a deduction/logic puzzle with min-to-mod A verbal/visual cues progressing to min A verbal cues. Patient was known to draw conclusions quickly without full information and benefited from instruction to visually organize information on separate notebook paper which facilitated planning and organization skills. Patient was left in chair with alarm activated and immediate needs within reach at end of session. Continue per current plan of care.      Pain Pain Assessment Pain Scale: 0-10 Pain Score: 0-No pain  Therapy/Group: Individual Therapy  Patty Sermons 02/06/2021, 2:44 PM

## 2021-02-06 NOTE — Progress Notes (Signed)
Occupational Therapy Session Note  Patient Details  Name: Joseph Banks MRN: [id number] Date of Birth: 1946/04/17  Today's Date: 02/06/2021 OT Individual Time: [id number] OT Individual Time Calculation (min): 43 min    Short Term Goals: Week 1:  OT Short Term Goal 1 (Week 1): STG=LTG d/t ELOS  Skilled Therapeutic Interventions/Progress Updates:    Pt completed supine to sit with supervision and then donned his shoes from the bedside recliner with supervision.  He then ambulated down to the therapy gym for session with supervision ambulating without an assistive device.  He was able to complete use of the [employer] for work an ankle strategies.  He completed 3 intervals of approximately 1 min for Limits of Stability Program at 29%, 21%, and 23 %.  He completed Random Control at 85% for 3 minute interval as well.  He was able to complete the final set of catch game for 3 mins with an average reaction time of 1.75 seconds.  Ambulated back to the room with supervision and no assistive device.  He was left sitting in the bed with the call button and phone in reach and chair alarm in place.    Therapy Documentation Precautions:  Precautions Precautions: Fall Restrictions Weight Bearing Restrictions: No  Pain: Pain Assessment Pain Scale: Faces Pain Score: 0-No pain ADL: See Care Tool Section for some details of mobility and selfcare  Therapy/Group: Individual Therapy  Jalei Shibley OTR/L 02/06/2021, 4:28 PM

## 2021-02-06 NOTE — Progress Notes (Signed)
PROGRESS NOTE   Subjective/Complaints:   Pt reports rested better; slept slightly better.   LBM yesterday.  Asking about how to measure bladder issues at home.  Educated to document how often voiding, since double voiding a sign of urinary retention- also get in with Urology in 2-4 weeks- doesn't need to be day after d/c.     ROS:  Pt denies SOB, abd pain, CP, N/V/C/D, and vision changes      Objective:   No results found. Recent Labs    02/05/21 0539  WBC 5.0  HGB 10.9*  HCT 32.5*  PLT 470*   Recent Labs    02/05/21 0539  NA 134*  K 4.3  CL 103  CO2 24  GLUCOSE 126*  BUN 16  CREATININE 1.01  CALCIUM 9.7    Intake/Output Summary (Last 24 hours) at 02/06/2021 0836 Last data filed at 02/06/2021 0700 Gross per 24 hour  Intake 716 ml  Output 1675 ml  Net -959 ml        Physical Exam: Vital Signs Blood pressure 98/70, pulse 86, temperature 97.9 F (36.6 C), temperature source Oral, resp. rate 16, height '6\' 3"'$  (1.905 m), weight 98.7 kg, SpO2 100 %.        General: awake, alert, appropriate, sitting up in bedside chair, excited about d/c plans; NAD HENT: conjugate gaze; oropharynx moist CV: regular rate; no JVD Pulmonary: CTA B/L; no W/R/R- good air movement GI: soft, NT, ND, (+)BS Psychiatric: appropriate; but very focused on d/c info today Neurological: alert  Cranial nerves II through XII intact, motor strength is 4/5 in bilateral deltoid, bicep, tricep, grip, hip flexor, knee extensors, ankle dorsiflexor and plantar flexor Sensory exam normal sensation to light touch and proprioception in bilateral upper and lower extremities Cerebellar exam normal finger to nose to finger as well as heel to shin in bilateral upper and lower extremities Musculoskeletal: Full range of motion in all 4 extremities. No joint swelling   Assessment/Plan: 1. Functional deficits which require 3+ hours per day  of interdisciplinary therapy in a comprehensive inpatient rehab setting. Physiatrist is providing close team supervision and 24 hour management of active medical problems listed below. Physiatrist and rehab team continue to assess barriers to discharge/monitor patient progress toward functional and medical goals  Care Tool:  Bathing    Body parts bathed by patient: Right arm, Left arm, Chest, Abdomen, Front perineal area, Buttocks, Right upper leg, Left upper leg, Face   Body parts bathed by helper: Right lower leg, Left lower leg     Bathing assist Assist Level: Minimal Assistance - Patient > 75%     Upper Body Dressing/Undressing Upper body dressing   What is the patient wearing?: Pull over shirt    Upper body assist Assist Level: Independent    Lower Body Dressing/Undressing Lower body dressing      What is the patient wearing?: Pants     Lower body assist Assist for lower body dressing: Minimal Assistance - Patient > 75%     Toileting Toileting    Toileting assist Assist for toileting: Minimal Assistance - Patient > 75%     Transfers Chair/bed transfer  Transfers assist  Chair/bed transfer assist level: Minimal Assistance - Patient > 75%     Locomotion Ambulation   Ambulation assist      Assist level: Contact Guard/Touching assist Assistive device: Walker-rolling Max distance: 300   Walk 10 feet activity   Assist     Assist level: Contact Guard/Touching assist Assistive device: Walker-rolling   Walk 50 feet activity   Assist    Assist level: Contact Guard/Touching assist Assistive device: Walker-rolling    Walk 150 feet activity   Assist    Assist level: Contact Guard/Touching assist Assistive device: Walker-rolling    Walk 10 feet on uneven surface  activity   Assist     Assist level: Minimal Assistance - Patient > 75% Assistive device: Aeronautical engineer Will patient use wheelchair at  discharge?: No Type of Wheelchair: Manual    Wheelchair assist level: Supervision/Verbal cueing Max wheelchair distance: 400 ft    Wheelchair 50 feet with 2 turns activity    Assist        Assist Level: Supervision/Verbal cueing   Wheelchair 150 feet activity     Assist      Assist Level: Supervision/Verbal cueing   Blood pressure 98/70, pulse 86, temperature 97.9 F (36.6 C), temperature source Oral, resp. rate 16, height '6\' 3"'$  (1.905 m), weight 98.7 kg, SpO2 100 %.     Medical Problem List and Plan: 1.  Debility secondary to acute prostatitis with severe sepsis and associated complications             -patient may  shower             -ELOS/Goals: 10-14 days, mod I to supervision with PT/OT  -con't PT and OT- pt doesn't appear to understand that can trigger prostatitis if not emptying bladder normally- attempted to explain. He did not voice understanding, in spite of wording it different ways.   Continue CIR- PT, OT and SLP 2.  Antithrombotics: -DVT/anticoagulation: SCDs             -antiplatelet therapy: 3. Pain Management: Neurontin 600 mg twice daily, Lidoderm patch as directed, tramadol 50 mg every 6 hours as needed  7/25- hasn't been taking tramadol for pain-asked pt to try before changing other meds. If doesn't work, will change meds around, increase things so can sleep/pain. 7/26- took tramadol last night; also will increase gabapentin to 600 mg TID for R hip and  feet nerve pain. 7/27- encouraged pt to take tramadol prn- will add Robaxin 500 mg q6 hours prn for muscle tightness.   7/29- pain controlled per pt- con't regimen 4. Mood: Provide emotional support             -antipsychotic agents: N/A 5. Neuropsych: This patient is? capable of making decisions on his own behalf. 6. Skin/Wound Care: Routine skin checks 7. Fluids/Electrolytes/Nutrition: Routine in and outs with follow-up chemistries 8.  Dysphagia.  Diet advanced to mechanical soft. 82.  Newly  diagnosed transient atrial fibrillation RVR.  Felt to be related to sepsis.  Cardiac rate controlled.  Follow-up cardiology services from a distance  7/29- rate controlled- con't regimen 10.  Transaminitis.  LFTs normalizing.  Follow-up panel.  7/25- resolved 11.  Diabetes mellitus.  Glucophage 500 mg twice daily.  No SSI for now CBG (last 3)  Recent Labs    02/04/21 1618  GLUCAP 123*    7/26- last BG checked was 7/23- over weekend, they stopped BG checks.   7/28- BG 123-  done yesterday- will stop CBGs  12. Prostatitis: abx completed- hemorrhagic- some worsening hematuria yesterday after move to new room, need to keep foley tube secure ,no sig drop in Hgb, as per uro cont to irrigate foley , inform uro if there is increased clot formation or thick blood in tube             -continue proscar and flomax             -DO NOT REMOVE FOLEY- told pt to make appt with Dr Diona Fanti in [place] 8/2  7/25- will call Dr Diona Fanti to see if can try voiding trial in rehab?  7/26- pt insisted he would go AMA or remove foley himself if wasn't removed-  is on voiding trial with PVR's q6 hours- if volumes >350cc after voiding, will need caths- will decrease risk of worsening prostatitis if this is done- retention not good for bladder/prostate. Of note, still passing blood clots/hematuria intermittently- will monitor  7/27- urine clear per my exam and per nurse- will increase Flomax to 0.8 qsupper, since still retaining, but hasn't needed in/out cathing- refusing bladders scans 50% of time.   7/28- doing much better- volumes ~ 100cc with bladder scans- no in/out caths required- con't regimen for now  7/29- educated on bladder/needs to count voiding times at home- so can see if double voiding- doesn't have to be as precise with exact I/o's.  13. Hyponatremia  7/26- will recheck on Thursday since is new- first time not 135-140. 7/29- back to 134- con't to monitor  14. Insomnia  7/27- on Ambien prn, but  will add Melatonin 5 mg QHS and con't prn Ambien-   7/28- will change ambien to scheduled and give with melatonin at 8pm to help-   7/29- slept a little better last night- con't to monitor    LOS: 7 days A FACE TO FACE EVALUATION WAS PERFORMED  Sheridyn Canino 02/06/2021, 8:36 AM

## 2021-02-07 LAB — CULTURE, FUNGUS WITHOUT SMEAR

## 2021-02-07 MED ORDER — GUAIFENESIN-DM 100-10 MG/5ML PO SYRP
5.0000 mL | ORAL_SOLUTION | ORAL | Status: DC | PRN
  Administered 2021-02-07 – 2021-02-09 (×3): 5 mL via ORAL
  Filled 2021-02-07 (×4): qty 5

## 2021-02-07 NOTE — Progress Notes (Signed)
PROGRESS NOTE   Subjective/Complaints: C/o cough- Robitussin ordered Feels therapy is going well Wife visiting today Feels stable for d/c next week  ROS:  Pt denies SOB, abd pain, CP, N/V/C/D, and vision changes  Objective:   No results found. Recent Labs    02/05/21 0539  WBC 5.0  HGB 10.9*  HCT 32.5*  PLT 470*   Recent Labs    02/05/21 0539  NA 134*  K 4.3  CL 103  CO2 24  GLUCOSE 126*  BUN 16  CREATININE 1.01  CALCIUM 9.7    Intake/Output Summary (Last 24 hours) at 02/07/2021 1146 Last data filed at 02/07/2021 0915 Gross per 24 hour  Intake 972 ml  Output 1925 ml  Net -953 ml        Physical Exam: Vital Signs Blood pressure 113/71, pulse 75, temperature 97.6 F (36.4 C), temperature source Oral, resp. rate 16, height '6\' 3"'$  (1.905 m), weight 98.7 kg, SpO2 100 %.  Gen: no distress, normal appearing HEENT: oral mucosa pink and moist, NCAT Cardio: Reg rate Chest: normal effort, normal rate of breathing Abd: soft, non-distended Ext: no edema Psychiatric: appropriate; but very focused on d/c info today Neurological: alert  Cranial nerves II through XII intact, motor strength is 4/5 in bilateral deltoid, bicep, tricep, grip, hip flexor, knee extensors, ankle dorsiflexor and plantar flexor Sensory exam normal sensation to light touch and proprioception in bilateral upper and lower extremities Cerebellar exam normal finger to nose to finger as well as heel to shin in bilateral upper and lower extremities Musculoskeletal: Full range of motion in all 4 extremities. No joint swelling   Assessment/Plan: 1. Functional deficits which require 3+ hours per day of interdisciplinary therapy in a comprehensive inpatient rehab setting. Physiatrist is providing close team supervision and 24 hour management of active medical problems listed below. Physiatrist and rehab team continue to assess barriers to  discharge/monitor patient progress toward functional and medical goals  Care Tool:  Bathing    Body parts bathed by patient: Right arm, Left arm, Chest, Abdomen, Front perineal area, Buttocks, Right upper leg, Left upper leg, Face   Body parts bathed by helper: Right lower leg, Left lower leg     Bathing assist Assist Level: Minimal Assistance - Patient > 75%     Upper Body Dressing/Undressing Upper body dressing   What is the patient wearing?: Pull over shirt    Upper body assist Assist Level: Independent    Lower Body Dressing/Undressing Lower body dressing      What is the patient wearing?: Pants     Lower body assist Assist for lower body dressing: Minimal Assistance - Patient > 75%     Toileting Toileting    Toileting assist Assist for toileting: Minimal Assistance - Patient > 75%     Transfers Chair/bed transfer  Transfers assist     Chair/bed transfer assist level: Independent with assistive device Chair/bed transfer assistive device: Programmer, multimedia   Ambulation assist      Assist level: Independent with assistive device Assistive device: Walker-rolling Max distance: 500+ ft   Walk 10 feet activity   Assist     Assist  level: Independent with assistive device Assistive device: Walker-rolling   Walk 50 feet activity   Assist    Assist level: Independent with assistive device Assistive device: Walker-rolling    Walk 150 feet activity   Assist    Assist level: Supervision/Verbal cueing Assistive device: Walker-rolling    Walk 10 feet on uneven surface  activity   Assist     Assist level: Supervision/Verbal cueing Assistive device: Aeronautical engineer Will patient use wheelchair at discharge?: No Type of Wheelchair: Manual    Wheelchair assist level: Supervision/Verbal cueing Max wheelchair distance: 400 ft    Wheelchair 50 feet with 2 turns activity    Assist         Assist Level: Supervision/Verbal cueing   Wheelchair 150 feet activity     Assist      Assist Level: Supervision/Verbal cueing   Blood pressure 113/71, pulse 75, temperature 97.6 F (36.4 C), temperature source Oral, resp. rate 16, height '6\' 3"'$  (1.905 m), weight 98.7 kg, SpO2 100 %.     Medical Problem List and Plan: 1.  Debility secondary to acute prostatitis with severe sepsis and associated complications             -patient may  shower             -ELOS/Goals: 10-14 days, mod I to supervision with PT/OT  -Continue PT and OT- pt doesn't appear to understand that can trigger prostatitis if not emptying bladder normally- attempted to explain. He did not voice understanding, in spite of wording it different ways.  2.  Impaired mobility -DVT/anticoagulation: Continue SCDs             -antiplatelet therapy: 3. Neuropathic pain/R hip pain: Neurontin 600 mg twice daily, Lidoderm patch as directed, tramadol 50 mg every 6 hours as needed. Consider [facility] outpatient.    7/25- hasn't been taking tramadol for pain-asked pt to try before changing other meds. If doesn't work, will change meds around, increase things so can sleep/pain. 7/26- took tramadol last night; also will increase gabapentin to 600 mg TID for R hip and  feet nerve pain. 7/27- encouraged pt to take tramadol prn- will add Robaxin 500 mg q6 hours prn for muscle tightness.   7/29- pain controlled per pt- con't regimen 4. Mood: Provide emotional support             -antipsychotic agents: N/A 5. Neuropsych: This patient is? capable of making decisions on his own behalf. 6. Skin/Wound Care: Routine skin checks 7. Fluids/Electrolytes/Nutrition: Routine in and outs with follow-up chemistries 8.  Dysphagia.  Diet advanced to mechanical soft. 44.  Newly diagnosed transient atrial fibrillation RVR.  Felt to be related to sepsis.  Cardiac rate controlled.  Follow-up cardiology services from a distance  7/29- rate controlled-  con't regimen 10.  Transaminitis.  LFTs normalizing.  Follow-up panel.  7/25- resolved 11.  Diabetes mellitus.  Continue Glucophage 500 mg twice daily.  No SSI for now CBG (last 3)  Recent Labs    02/04/21 1618  GLUCAP 123*    7/26- last BG checked was 7/23- over weekend, they stopped BG checks.   7/28- BG 123- done yesterday- will stop CBGs  12. Prostatitis: abx completed- hemorrhagic- some worsening hematuria yesterday after move to new room, need to keep foley tube secure ,no sig drop in Hgb, as per uro cont to irrigate foley , inform uro if there is increased clot formation or thick blood  in tube             -continue proscar and flomax             -DO NOT REMOVE FOLEY- told pt to make appt with Dr Diona Fanti in [place] 8/2  7/25- will call Dr Diona Fanti to see if can try voiding trial in rehab?  7/26- pt insisted he would go AMA or remove foley himself if wasn't removed-  is on voiding trial with PVR's q6 hours- if volumes >350cc after voiding, will need caths- will decrease risk of worsening prostatitis if this is done- retention not good for bladder/prostate. Of note, still passing blood clots/hematuria intermittently- will monitor  7/27- urine clear per my exam and per nurse- will increase Flomax to 0.8 qsupper, since still retaining, but hasn't needed in/out cathing- refusing bladders scans 50% of time.   7/28- doing much better- volumes ~ 100cc with bladder scans- no in/out caths required- con't regimen for now  7/29- educated on bladder/needs to count voiding times at home- so can see if double voiding- doesn't have to be as precise with exact I/o's.  13. Hyponatremia  7/26- will recheck on Thursday since is new- first time not 135-140. 7/29- back to 134- con't to monitor  14. Insomnia  7/27- on Ambien prn, but will add Melatonin 5 mg QHS and con't prn Ambien-   7/28- will change ambien to scheduled and give with melatonin at 8pm to help-   7/29- slept a little better last  night- con't to monitor    LOS: 8 days A FACE TO [place] 02/07/2021, 11:46 AM

## 2021-02-08 NOTE — Discharge Summary (Signed)
Physician Discharge Summary  Patient ID: Joseph Banks MRN: [id number] DOB/AGE: 09/15/1945 75 y.o.  Admit date: 01/30/2021 Discharge date: 02/09/2021   Discharge Diagnoses:  Principal Problem:   Debility Active Problems:   Sepsis ([place]) Pain management Dysphagia Newly diagnosed transient atrial fibrillation with RVR Transaminitis Diabetes mellitus Prostatitis Tobacco use  Discharged Condition: Stable  Significant Diagnostic Studies: DG Abd 1 View  Result Date: 01/19/2021 CLINICAL DATA:  NG tube placement EXAM: ABDOMEN - 1 VIEW COMPARISON:  01/13/2021 FINDINGS: Feeding tube is in place with the tip in the distal stomach. Right PICC line tip is in the right atrium. Nonobstructive bowel gas pattern. No organomegaly or free air. IMPRESSION: Feeding tube tip in the distal stomach. Electronically Signed   By: Rolm Baptise M.D.   On: 01/19/2021 08:21   DG Abd 1 View  Result Date: 01/13/2021 CLINICAL DATA:  NG tube placement EXAM: ABDOMEN - 1 VIEW COMPARISON:  CT 01/12/2021 FINDINGS: Esophageal tube tip overlies the mid to distal stomach. Gas pattern nonobstructed. Artifact over the abdomen and lower chest. IMPRESSION: Esophageal tube tip overlies the mid to distal stomach Electronically Signed   By: Donavan Foil M.D.   On: 01/13/2021 17:12   CT HEAD WO CONTRAST  Result Date: 01/15/2021 CLINICAL DATA:  Fever. EXAM: CT HEAD WITHOUT CONTRAST TECHNIQUE: Contiguous axial images were obtained from the base of the skull through the vertex without intravenous contrast. COMPARISON:  None. FINDINGS: Brain: Patient had difficulty tolerating the exam there is moderate motion artifact allowing for motion, no intracranial hemorrhage. No mass effect or midline shift. No hydrocephalus. The basilar cisterns are patent. No evidence of territorial infarct or acute ischemia. No extra-axial or intracranial fluid collection. Vascular: No hyperdense vessel. Mild carotid skull base atherosclerosis. Skull: No  fracture or focal lesion. Sinuses/Orbits: Postsurgical change of the right globe. No acute findings. Other: None. IMPRESSION: Motion limited exam. No acute intracranial abnormality. Electronically Signed   By: Keith Rake M.D.   On: 01/15/2021 15:42   MR BRAIN W WO CONTRAST  Result Date: 01/21/2021 CLINICAL DATA:  Ongoing encephalopathy. Lymphocytic predominance on CSF. Viral meningitis. EXAM: MRI HEAD WITHOUT AND WITH CONTRAST TECHNIQUE: Multiplanar, multiecho pulse sequences of the brain and surrounding structures were obtained without and with intravenous contrast. CONTRAST:  29m GADAVIST GADOBUTROL 1 MMOL/ML IV SOLN COMPARISON:  CT head without contrast 01/15/2021 FINDINGS: Brain: Study is mildly degraded patient motion. Periventricular white matter changes are within normal limits for age. No acute infarct, hemorrhage, or mass lesion is present. The ventricles are of normal size. No significant extraaxial fluid collection is present. The internal auditory canals are within normal limits. The brainstem and cerebellum are within normal limits. Postcontrast images demonstrate no pathologic enhancement. Vascular: Flow is present in the major intracranial arteries. Skull and upper cervical spine: Degenerative changes are noted at C3-4. Decreased T1 marrow signal is present throughout. No focal lesions are present. Craniocervical junction is normal. Sinuses/Orbits: Banding noted on the right. Right lens replacement present. Globes and orbits are otherwise within normal limits. Paranasal sinuses are clear. There is some fluid in the mastoid air cells bilaterally. No obstructing nasopharyngeal lesion is present. IMPRESSION: 1. Normal MRI appearance of the brain for age. No acute or focal lesion to explain the patient's symptoms. 2. Minimal fluid in the mastoid air cells bilaterally. No obstructing nasopharyngeal lesion is present. Electronically Signed   By: CSan MorelleM.D.   On: 01/21/2021 14:58    MR PELVIS WO CONTRAST  Result Date:  01/15/2021 CLINICAL DATA:  Prostatitis, rule out prostate abscess. EXAM: MRI PELVIS WITHOUT CONTRAST TECHNIQUE: Multiplanar multisequence MR imaging of the pelvis was performed. No intravenous contrast was administered. Study limited by susceptibility artifact from RIGHT hip arthroplasty and due to gross patient motion on some images. COMPARISON:  CT of the abdomen and pelvis dated January 12, 2021. FINDINGS: Urinary Tract: Hyperintense material on T1 showing T2 hypointensity about the bladder base surrounding the patient's Foley catheter. Mildly patulous appearance of the distal ureters without frank dilation. Bowel: Visualized bowel is grossly unremarkable on very limited assessment. Vascular/Lymphatic: Vascular structures are normal caliber also with limited evaluation particularly due to lack of contrast administration. No gross adenopathy in the pelvis Reproductive: Prostate is enlarged with diffuse low signal on T2 weighted images and signs of BPH. No discrete fluid collection visualized on noncontrast images within the prostate gland. Prostate measures 7.1 x 6.5 x 6.7 (volume = 160 cc) cm. Foley catheter in the urinary bladder. Small bilateral hydroceles, testicles not fully evaluated. Other:  Signs of body wall edema.  Trace free fluid in the pelvis. Musculoskeletal: RIGHT hip arthroplasty. No suspicious bone lesion on limited assessment. IMPRESSION: 1. No discrete fluid collection visualized on noncontrast images within the prostate gland. Marked prostatomegaly with diffuse low signal throughout the gland with changes of BPH, nonspecific and could certainly be seen in the setting of marked prostatitis. Consider PSA correlation and follow-up as warranted. 2. Decreased blood products in the urinary bladder compared to recent CT evaluation. 3. Trace free fluid in the pelvis. Electronically Signed   By: Zetta Bills M.D.   On: 01/15/2021 12:23   CT ABDOMEN PELVIS W  CONTRAST  Result Date: 01/12/2021 CLINICAL DATA:  Abdominal pain. EXAM: CT ABDOMEN AND PELVIS WITH CONTRAST TECHNIQUE: Multidetector CT imaging of the abdomen and pelvis was performed using the standard protocol following bolus administration of intravenous contrast. CONTRAST:  152m OMNIPAQUE IOHEXOL 300 MG/ML  SOLN COMPARISON:  01/08/2021 FINDINGS: Lower chest: Subsegmental atelectasis and dependent change noted in the right lung base. Hepatobiliary: No focal liver abnormality. The gallbladder is normal. No signs of bile duct dilatation Pancreas: Unremarkable. No pancreatic ductal dilatation or surrounding inflammatory changes. Spleen: Normal in size without focal abnormality. Adrenals/Urinary Tract: Normal adrenal glands. No kidney mass identified bilaterally. Bilateral pelviectasis is new from previous exam there is mild bilateral hydroureter. Mild bladder distension. Streak artifact from right hip arthroplasty device diminishes evaluation of the bladder. Similar to the previous exam is a heterogeneous, hyperdense mass within the bladder measuring 6.8 x 5.8 by 4.8 cm. Stomach/Bowel: Small hiatal hernia. Stomach is nondistended the appendix is not confidently identified no secondary signs of acute appendicitis no bowel wall thickening, inflammation, or distension. Vascular/Lymphatic: Aortic atherosclerosis. No retroperitoneal or mesenteric adenopathy multiple prominent, non pathologically enlarged pelvic lymph nodes are identified similar to previous exam for example, right external iliac node measures 0.9 cm, image 68/2. Borderline enlarged bilateral inguinal lymph nodes are identified. Index left inguinal lymph node measures 1.3 cm, image 98/2. Index right inguinal node measures 1.2 cm, image 98/2. Reproductive: Prostate gland appears enlarged with mass effect upon the bladder base measuring approximate 6.9 x 5.6 x 7.4 cm (volume = 150 cm^3). Other: No free fluid or fluid collections. 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 Similar appearance of heterogeneous, hyperdense mass within the bladder which may represent a large blood  clot or neoplasm. Evaluation the bladder is diminished secondary to beam hardening artifact from patient's right hip arthroplasty device. Bladder ultrasound may be helpful in the acute setting to assess the nature of this mass. 2. Bilateral pelviectasis and hydroureter. 3. Marked prostate gland enlargement with mass effect upon the bladder base. 4. 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 5. Aortic atherosclerosis. Aortic Atherosclerosis (ICD10-I70.0). Electronically Signed   By: Kerby Moors M.D.   On: 01/12/2021 15:55   CT CHEST ABDOMEN PELVIS W CONTRAST  Result Date: 01/15/2021 CLINICAL DATA:  Persistent fevers.  Evaluate for infectious source. EXAM: CT CHEST, ABDOMEN, AND PELVIS WITH CONTRAST TECHNIQUE: Multidetector CT imaging of the chest, abdomen and pelvis was performed following the standard protocol during bolus administration of intravenous contrast. CONTRAST:  182m OMNIPAQUE IOHEXOL 300 MG/ML  SOLN COMPARISON:  MRI pelvis 01/15/2021 and CT scan 01/12/2021 FINDINGS: CT CHEST FINDINGS Cardiovascular: The heart is normal in size. No pericardial effusion. The aorta is normal in caliber. No dissection. Scattered coronary artery calcifications. Mediastinum/Nodes: No mediastinal or hilar mass or lymphadenopathy. There is an NG tube coursing down the esophagus and into the stomach. Lungs/Pleura: Small to moderate-sized bilateral pleural effusions with overlying atelectasis. No pulmonary infiltrates or pulmonary edema. No worrisome pulmonary lesions. Few small perifissural nodules are likely benign lymph nodes. Musculoskeletal: No significant bony findings. CT ABDOMEN PELVIS FINDINGS Hepatobiliary: No hepatic lesions or  intrahepatic biliary dilatation. The gallbladder is grossly normal. No common bile duct dilatation. Pancreas: No mass, inflammation or ductal dilatation. Spleen: Normal size.  No focal lesions. Adrenals/Urinary Tract: The adrenal glands and kidneys are unremarkable. No renal lesions or obstructing ureteral calculi. No bladder mass is identified. There is a Foley catheter in the bladder and some residual hematoma. Stomach/Bowel: The stomach, duodenum, small bowel and colon are unremarkable. No acute inflammatory changes, mass lesions or obstructive findings. Vascular/Lymphatic: Stable aortic and iliac artery calcifications. No aneurysm. The major venous structures are patent. No mesenteric or retroperitoneal mass or adenopathy. Small scattered lymph nodes are stable. Reproductive: Significant artifact from the right hip prosthesis. The prostate gland is markedly enlarged but no obvious abscess. Other: Small amount of free abdominal and free pelvic fluid is noted but no rim enhancing abscess. Body wall edema is noted mainly in the flank areas. 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 IMPRESSION: 1. Small to moderate-sized bilateral pleural effusions with overlying atelectasis. 2. Small amount of free abdominal and free pelvic fluid but no rim enhancing abscess. 3. Markedly enlarged prostate gland but no obvious abscess. 4. Foley catheter in the bladder with some residual hematoma. Aortic Atherosclerosis (ICD10-I70.0).  Foot Electronically Signed   By: PMarijo SanesM.D.   On: 01/15/2021 15:53   IR Fluoro Guide Ndl Plmt / BX  Result Date: 01/16/2021 CLINICAL DATA:  Mental status change EXAM: DIAGNOSTIC LUMBAR PUNCTURE UNDER FLUOROSCOPIC GUIDANCE FLUOROSCOPY TIME:  6 seconds; 1 mGy PROCEDURE: Informed consent was obtained from the family prior to the procedure, including potential complications of headache,  allergy, and pain. Intravenous Fentanyl 535m, Benadryl 25 mg, and Versed '2mg'$  were administered as conscious sedation during continuous monitoring of the patient's level of consciousness and physiological / cardiorespiratory status by the radiology RN, with a total moderate sedation time of 10 minutes. With the patient prone, the lower back was prepped with Betadine. 1% Lidocaine was used for local anesthesia. Lumbar puncture was performed at  the L4 level from a left parasagittal approach using a 20 gauge needle with return of pale yellow tinged CSF. 10 ml of CSF were obtained for laboratory studies, divided among for sequentially numbered vials. The patient tolerated the procedure well and there were no apparent complications. IMPRESSION: 1. Technically successful lumbar puncture under fluoroscopy Electronically Signed   By: Lucrezia Europe M.D.   On: 01/16/2021 17:05   DG CHEST PORT 1 VIEW  Result Date: 01/17/2021 CLINICAL DATA:  History of diabetes and known bladder mass EXAM: PORTABLE CHEST 1 VIEW COMPARISON:  01/15/2021 FINDINGS: Cardiac shadow is stable. Feeding catheter extends into the stomach. Right-sided PICC line is noted the cavoatrial junction stable from the prior exam. Bilateral pleural effusions are again seen. Left retrocardiac consolidation is noted as well. No significant vascular congestion is seen. IMPRESSION: Bilateral effusions and left retrocardiac opacity. Tubes and lines as described. Electronically Signed   By: Inez Catalina M.D.   On: 01/17/2021 21:13   DG Chest Port 1 View  Result Date: 01/15/2021 CLINICAL DATA:  PICC line placement EXAM: PORTABLE CHEST 1 VIEW COMPARISON:  01/14/2021, CT 01/15/2021 FINDINGS: Esophageal tube tip extends below diaphragm but is incompletely visualized. Right upper extremity central venous catheter tip faintly visible over the SVC. Bilateral pleural effusions. Slight worsening of consolidation at left lung base. Cardiomegaly with vascular congestion.  IMPRESSION: 1. Right upper extremity central venous catheter tip overlies the SVC. 2. Cardiomegaly with vascular congestion. Bilateral pleural effusions with slight worsening of consolidation at left lung base Electronically Signed   By: Donavan Foil M.D.   On: 01/15/2021 18:51   DG CHEST PORT 1 VIEW  Result Date: 01/14/2021 CLINICAL DATA:  Fever.  Aspiration. EXAM: PORTABLE CHEST 1 VIEW COMPARISON:  01/12/2021. FINDINGS: Feeding tube noted with tip below hemidiaphragms. Cardiomegaly. No pulmonary venous congestion. Bibasilar pulmonary infiltrates/edema. Small left pleural effusion cannot be excluded. No pneumothorax. No acute bony abnormality identified. IMPRESSION: 1.  Feeding tube noted with tip below left hemidiaphragm. 2.  Cardiomegaly.  No pulmonary venous congestion. 3. Bibasilar pulmonary infiltrates/edema. Aspiration cannot be excluded. Small left pleural effusion cannot be excluded. Electronically Signed   By: Marcello Moores  Register   On: 01/14/2021 06:57   DG Chest Port 1 View  Result Date: 01/12/2021 CLINICAL DATA:  Questionable sepsis. EXAM: PORTABLE CHEST 1 VIEW COMPARISON:  Abdominal CT 12/12/2020 FINDINGS: Portable views of the chest were obtained. Few densities at the left lung base and there were small densities in this area on the previous CT topogram. Otherwise, the lungs are clear. Negative for a pneumothorax. Heart and mediastinum are within normal limits. Trachea is midline. IMPRESSION: 1. No acute cardiopulmonary disease. 2. Left basilar densities are suggestive for atelectasis or scarring. Electronically Signed   By: Markus Daft M.D.   On: 01/12/2021 13:47   [place] EKG SITE RITE  Result Date: 01/15/2021 If Site Rite image not attached, placement could not be confirmed due to current cardiac rhythm.   Labs:  Basic Metabolic Panel: Recent Labs  Lab 02/02/21 0712 02/05/21 0539  NA 129* 134*  K 3.9 4.3  CL 99 103  CO2 20* 24  GLUCOSE 189* 126*  BUN 12 16  CREATININE 0.86 1.01   CALCIUM 9.1 9.7    CBC: Recent Labs  Lab 02/02/21 0712 02/05/21 0539  WBC 7.1 5.0  NEUTROABS 3.9 1.7  HGB 10.3* 10.9*  HCT 30.4* 32.5*  MCV 91.6 92.9  PLT 409* 470*    CBG: Recent Labs  Lab 02/04/21  [place]    Brief HPI:   Joseph Banks is a 75 y.o. right-handed male with history of diabetes mellitus feels BPH with tobacco abuse.  Presented 01/12/2021 with nonspecific perianal pain as well as low-grade fever.  He had recently seen urology services December 25, 2020 diagnosed with prostatitis and epididymitis and was given 1 shot of ceftriaxone and placed on Bactrim.  At that time patient had CT scan also placed on Cipro which patient had been taking for at least 4 days despite which patient's pain continued to worsen and fever of 104 degrees.  In the ED he was tachycardic.  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 Junious Silk.  Blood cultures were obtained maintain on broad-spectrum antibiotics.  Admission chemistry sodium 132 BUN 24 creatinine 1.26 AST 181 ALT 107 hemoglobin 11.8 urine drug screen 1000 colony staph epidermidis, lactic acid 2.0.  A Foley tube remained in place completing course of antibiotics.  Underwent lumbar puncture for altered mental status HSV and enterovirus PCR negative he had initially been placed on acyclovir discontinued.  Meningitis encephalitis panel negative.  MRI 7/13 of the brain did not show any acute findings.  Hospital course complicated by newly diagnosed transient atrial fibrillation RVR cardiology services follow-up felt to be precipitated by sepsis.  Cardiac rate controlled and monitored.  Nonspecific back pain placed on Lidoderm patch as well use of tramadol.  In regards to transaminitis likely shock liver LFTs continue to normalize and monitored.  He did have some dysphagia diet slowly advance to mechanical soft.  Due to patient decreased functional mobility was admitted  for a comprehensive rehab program.   Hospital Course: [facility] was admitted to rehab 01/30/2021 for inpatient therapies to consist of PT, ST and OT at least three hours five days a week. Past admission physiatrist, therapy team and rehab RN have worked together to provide customized collaborative inpatient rehab.  Pertain to patient's debility secondary to acute prostatitis with severe sepsis associated complications.  He continued to progress in therapies remained afebrile.  Neuropathic pain right hip pain continued on Neurontin Lidoderm patch as directed tramadol as needed.  Dysphagia diet slowly advanced tolerating well.  Newly diagnosed transient atrial fibrillation RVR felt to be related to sepsis cardiac rate controlled follow-up cardiology services from a distance.  Transaminitis LFTs normalizing.  Blood sugars controlled monitored remained on Glucophage diabetic teaching as noted.  Prostatitis antibiotic completed.  Continue Proscar and Flomax.  Foley tube initially remained in place patient quite adamant about removal and was removed voiding without difficulty Flomax adjusted.  Patient educated on timed voids.   Blood pressures were monitored on TID basis and soft and monitored  Diabetes has been monitored with ac/hs CBG checks and SSI was use prn for tighter BS control.   Physical exam.  Blood pressure 112/73 pulse 93 temperature 91 respirations 18 oxygen saturation 96% room air Constitutional.  No acute distress HEENT Head.  Normocephalic and atraumatic Eyes.  Pupils round and reactive to light no discharge.nystagmus Neck.  Supple nontender no JVD without thyromegaly Cardiac regular rate rhythm not extra sounds or murmur heard Abdomen.  Soft nontender positive bowel sounds without rebound Respiratory effort normal no respiratory distress without wheeze Skin.  Warm and dry Neurologic.  Alert oriented makes eye contact with examiner follows commands.  Moves all  extremities   Rehab course: During patient's stay in rehab weekly team conferences were held to  monitor patient's progress, set goals and discuss barriers to discharge. At admission, patient required minimal guard sit to supine max assist supine to sit minimal guard rolling walker for general transfers ambulating 55 feet min mod assist  He/She  has had improvement in activity tolerance, balance, postural control as well as ability to compensate for deficits. He/She has had improvement in functional use RUE/LUE  and RLE/LLE as well as improvement in awareness.  Patient ambulates 300 feet with the use of rolling walker supervision modified independent.  Ambulates with decreased gait speed decreased step length and height.  Patient descends and descends 1 flight of stairs using a rail and supervision for safety.  He was able to toilet with modified independent ambulates to his room rolling walker modified independent.  He can gather his belongings for activities daily when homemaking.  He is currently min assist supervision for task due to mild higher-level cognitive deficits.  It was discussed with his wife or family teaching need for supervision at discharge.  Full family teaching completed and discharged to home       Disposition: Discharged to home    Diet: Diabetic diet  Special Instructions: No driving smoking or alcohol  Medications at discharge. 1.  Tylenol as needed 2.  Proscar 5 mg p.o. daily 3.  Neurontin 600 mg p.o. 3 times daily 4.  Lidoderm patch as directed 5.  Melatonin 5 mg p.o. nightly 6.  Glucophage 500 mg p.o. twice daily 7.  Robaxin 500 mg p.o. every 6 hours as needed muscle spasms 8.  Flomax 0.8 mg daily after supper 9.  Tramadol 50 mg p.o. every 6 as needed pain  30-35 minutes were spent completing discharge summary and discharge planning     Follow-up Information     Lovorn, Jinny Blossom, MD Follow up.   Specialty: Physical Medicine and Rehabilitation Why: No  formal follow-up needed Contact information: [id number] [address] [place] [address] [phone]         Franchot Gallo, MD Follow up.   Specialty: Urology Why: Call for appointment Contact information: [place] [address] [phone]                 Signed: Cathlyn Parsons 02/08/2021, 8:18 PM

## 2021-02-08 NOTE — Progress Notes (Signed)
Speech Language Pathology Discharge Summary  Patient Details  Name: Joseph Banks MRN: [id number] Date of Birth: 1946/01/08  Today's Date: 02/08/2021 SLP Individual Time: 1120-1200 SLP Individual Time Calculation (min): 40 min   Skilled Therapeutic Interventions:  Pt was seen for skilled ST targeting cognitive goals and completion of family education prior to discharge tomorrow.  [name] was present throughout today's therapy session and remained actively engaged in all treatment tasks.  Pt states that he is eager to return home because he has a lot of jobs that need to be done, specifically fixing a wheelbarrow and working in the yard.  Both SLP and pt's wife reiterated that he is still several weeks out from being able to complete those types of tasks.  Pt recalled numerous specific details from the last week's ST treatment sessions with supervision question cues.  [name] reports that pt is near his cognitive baseline and pt denies noticing significant cognitive changes.  SLP provided compensatory cognitive strategies to maximize safety and reduce caregiver burden in the home environment including maintaining a consistent routine and using associations and written aids to maximize recall of new information.  [name] had questions about pt returning to driving and SLP informed her that they will likely need to speak to pt's MD prior to driving.  Both verbalized understanding.  All other questions were answered to pt's and pt's wife's satisfaction at this time.  Pt was left in recliner with wife at bedside.      Patient has met 3 of 4 long term goals.  Patient to discharge at overall Supervision level.  Reasons goals not met:     Clinical Impression/Discharge Summary:  Pt has made functional gains while inpatient and is discharging having met 3 out of 4 long term goals.  Pt is currently min assist-supervision for tasks due to mild higher level cognitive deficits.  Pt and family education is  complete at this time.  Pt has demonstrated improved recall of daily information.  Pt is discharging home with recommendations for ST follow up at next level of care.   Care Partner:  Caregiver Able to Provide Assistance: Yes  Type of Caregiver Assistance: Physical;Cognitive  Recommendation:  Outpatient SLP  Rationale for SLP Follow Up: Maximize cognitive function and independence;Reduce caregiver burden   Equipment: none recommended by SLP   Reasons for discharge: Discharged from hospital   Patient/Family Agrees with Progress Made and Goals Achieved: Yes    Elesha Thedford, Selinda Orion 02/08/2021, 3:49 PM

## 2021-02-08 NOTE — Progress Notes (Signed)
PROGRESS NOTE   Subjective/Complaints: No complaints today Wife at bedside Patient is very grateful for care here Ready for d/c  ROS:  Pt denies SOB, abd pain, CP, N/V/C/D, and vision changes  Objective:   No results found. No results for input(s): WBC, HGB, HCT, PLT in the last 72 hours.  No results for input(s): NA, K, CL, CO2, GLUCOSE, BUN, CREATININE, CALCIUM in the last 72 hours.   Intake/Output Summary (Last 24 hours) at 02/08/2021 1726 Last data filed at 02/08/2021 0708 Gross per 24 hour  Intake 476 ml  Output 550 ml  Net -74 ml        Physical Exam: Vital Signs Blood pressure 124/75, pulse 87, temperature 97.6 F (36.4 C), temperature source Oral, resp. rate 16, height '6\' 3"'$  (1.905 m), weight 98.7 kg, SpO2 100 %.  Gen: no distress, normal appearing HEENT: oral mucosa pink and moist, NCAT Cardio: Reg rate Chest: normal effort, normal rate of breathing Abd: soft, non-distended Ext: no edema Psych: pleasant, normal affect Neurological: alert  Cranial nerves II through XII intact, motor strength is 4/5 in bilateral deltoid, bicep, tricep, grip, hip flexor, knee extensors, ankle dorsiflexor and plantar flexor Sensory exam normal sensation to light touch and proprioception in bilateral upper and lower extremities Cerebellar exam normal finger to nose to finger as well as heel to shin in bilateral upper and lower extremities Musculoskeletal: Full range of motion in all 4 extremities. No joint swelling   Assessment/Plan: 1. Functional deficits which require 3+ hours per day of interdisciplinary therapy in a comprehensive inpatient rehab setting. Physiatrist is providing close team supervision and 24 hour management of active medical problems listed below. Physiatrist and rehab team continue to assess barriers to discharge/monitor patient progress toward functional and medical goals  Care Tool:  Bathing     Body parts bathed by patient: Right arm, Left arm, Chest, Abdomen, Front perineal area, Buttocks, Right upper leg, Left upper leg, Face, Right lower leg, Left lower leg   Body parts bathed by helper: Right lower leg, Left lower leg     Bathing assist Assist Level: Supervision/Verbal cueing     Upper Body Dressing/Undressing Upper body dressing   What is the patient wearing?: Pull over shirt    Upper body assist Assist Level: Independent    Lower Body Dressing/Undressing Lower body dressing      What is the patient wearing?: Pants, Underwear/pull up     Lower body assist Assist for lower body dressing: Independent with assitive device     Toileting Toileting    Toileting assist Assist for toileting: Independent with assistive device     Transfers Chair/bed transfer  Transfers assist     Chair/bed transfer assist level: Independent with assistive device Chair/bed transfer assistive device: Programmer, multimedia   Ambulation assist      Assist level: Independent with assistive device Assistive device: Walker-rolling Max distance: >300 ft   Walk 10 feet activity   Assist     Assist level: Independent with assistive device Assistive device: Walker-rolling   Walk 50 feet activity   Assist    Assist level: Independent with assistive device Assistive device: Walker-rolling  Walk 150 feet activity   Assist    Assist level: Supervision/Verbal cueing Assistive device: Walker-rolling    Walk 10 feet on uneven surface  activity   Assist     Assist level: Supervision/Verbal cueing Assistive device: Aeronautical engineer Will patient use wheelchair at discharge?: Yes Type of Wheelchair: Manual Wheelchair activity did not occur: N/A  Wheelchair assist level: Supervision/Verbal cueing Max wheelchair distance: 400 ft    Wheelchair 50 feet with 2 turns activity    Assist    Wheelchair 50 feet with 2  turns activity did not occur: N/A   Assist Level: Supervision/Verbal cueing   Wheelchair 150 feet activity     Assist  Wheelchair 150 feet activity did not occur: N/A   Assist Level: Supervision/Verbal cueing   Blood pressure 124/75, pulse 87, temperature 97.6 F (36.4 C), temperature source Oral, resp. rate 16, height '6\' 3"'$  (1.905 m), weight 98.7 kg, SpO2 100 %.     Medical Problem List and Plan: 1.  Debility secondary to acute prostatitis with severe sepsis and associated complications             -patient may  shower             -ELOS/Goals: 10-14 days, mod I to supervision with PT/OT  -Continue PT and OT- pt doesn't appear to understand that can trigger prostatitis if not emptying bladder normally- attempted to explain. He did not voice understanding, in spite of wording it different ways.  2.  Impaired mobility -DVT/anticoagulation: Continue SCDs             -antiplatelet therapy: 3. Neuropathic pain/R hip pain: Continue neurontin 600 mg twice daily, Lidoderm patch as directed, tramadol 50 mg every 6 hours as needed. Consider [facility] outpatient.    7/25- hasn't been taking tramadol for pain-asked pt to try before changing other meds. If doesn't work, will change meds around, increase things so can sleep/pain. 7/26- took tramadol last night; also will increase gabapentin to 600 mg TID for R hip and  feet nerve pain. 7/27- encouraged pt to take tramadol prn- will add Robaxin 500 mg q6 hours prn for muscle tightness.   7/29- pain controlled per pt- con't regimen 4. Mood: Provide emotional support             -antipsychotic agents: N/A 5. Neuropsych: This patient is? capable of making decisions on his own behalf. 6. Skin/Wound Care: Routine skin checks 7. Fluids/Electrolytes/Nutrition: Routine in and outs with follow-up chemistries 8.  Dysphagia.  Diet advanced to mechanical soft. 16.  Newly diagnosed transient atrial fibrillation RVR.  Felt to be related to sepsis.  Cardiac  rate controlled.  Follow-up cardiology services from a distance  7/29- rate controlled- con't regimen 10.  Transaminitis.  LFTs normalizing.  Follow-up panel.  7/25- resolved 11.  Diabetes mellitus.  Continue Glucophage 500 mg twice daily.  No SSI for now CBG (last 3)  No results for input(s): GLUCAP in the last 72 hours.   7/26- last BG checked was 7/23- over weekend, they stopped BG checks.   7/28- BG 123- done yesterday- will stop CBGs  12. Prostatitis: abx completed- hemorrhagic- some worsening hematuria yesterday after move to new room, need to keep foley tube secure ,no sig drop in Hgb, as per uro cont to irrigate foley , inform uro if there is increased clot formation or thick blood in tube             -  continue proscar and flomax             -DO NOT REMOVE FOLEY- told pt to make appt with Dr Diona Fanti in [place] 8/2  7/25- will call Dr Diona Fanti to see if can try voiding trial in rehab?  7/26- pt insisted he would go AMA or remove foley himself if wasn't removed-  is on voiding trial with PVR's q6 hours- if volumes >350cc after voiding, will need caths- will decrease risk of worsening prostatitis if this is done- retention not good for bladder/prostate. Of note, still passing blood clots/hematuria intermittently- will monitor  7/27- urine clear per my exam and per nurse- will increase Flomax to 0.8 qsupper, since still retaining, but hasn't needed in/out cathing- refusing bladders scans 50% of time.   7/28- doing much better- volumes ~ 100cc with bladder scans- no in/out caths required- con't regimen for now  7/29- educated on bladder/needs to count voiding times at home- so can see if double voiding- doesn't have to be as precise with exact I/o's.  13. Hyponatremia  7/26- will recheck on Thursday since is new- first time not 135-140. 7/29- back to 134- con't to monitor  14. Insomnia  7/27- on Ambien prn, but will add Melatonin 5 mg QHS and con't prn Ambien-   7/28- will  change ambien to scheduled and give with melatonin at 8pm to help-   7/29- slept a little better last night- con't to monitor    LOS: 9 days A FACE TO [place] 02/08/2021, 5:26 PM

## 2021-02-08 NOTE — Progress Notes (Signed)
Physical Therapy Session Note  Patient Details  Name: Joseph Banks MRN: [id number] Date of Birth: 27-Jan-1946  Today's Date: 02/08/2021 PT Individual Time: 1457-1535 PT Individual Time Calculation (min): 38 min   Short Term Goals: Week 1:  PT Short Term Goal 1 (Week 1): STG=LTG due to ELOS  Skilled Therapeutic Interventions/Progress Updates:     Patient in recliner in the room upon PT arrival. Patient alert and agreeable to PT session. Patient denied pain during session.  Therapeutic Activity: Bed Mobility: Patient performed supine to/from sit independently on a mat table.  Transfers: Patient performed sit to/from stand with mod I throughout session using RW.   Gait Training:  Patient ambulated >300 feet using RW with supervision-mod I. Ambulated with decreased gait speed, decreased step length and height, increased B hip and knee flexion in stance, and mild forward trunk lean. Provided verbal cues x2 for increased step height and erect posture. Patient ascended/descended 1 flight of steps between the 5th and 4th floors using R rail with supervision for safety. Performed reciprocal gait pattern throughout. Patient ambulated up/down a ramp, over 10 feet of mulch (unlevel surface), and up/down a curb to simulate community ambulation over unlevel surfaces with supervision using RW. Provided min cues for technique and use of AD.  Therapeutic Exercise: Patient performed the following exercises, provided as HEP with handout, with verbal and tactile cues for proper technique. Access Code: [email or web address] Supine Bridge - 2-3 x daily - 7 x weekly - 2 sets - 10 reps Supine Active Straight Leg Raise - 2-3 x daily - 7 x weekly - 2 sets - 10 reps Bent Knee Fallouts - 2-3 x daily - 7 x weekly - 2 sets - 10 reps Sit to Stand with Arms Crossed - 2-3 x daily - 7 x weekly - 2 sets - 10 reps  Patient reported feeling confident and excited for d/c tomorrow. Educated patient on fall risk/prevention, home  modifications to prevent falls, and activation of emergency services in the event of a fall during session. Patient very appreciative and receptive to education.    Patient in recliner in the room at end of session with breaks locked and all needs within reach.   Therapy Documentation Precautions:  Precautions Precautions: Fall Restrictions Weight Bearing Restrictions: No    Therapy/Group: Individual Therapy  Heddy Vidana L Blondie Riggsbee PT, DPT  02/08/2021, 3:43 PM

## 2021-02-08 NOTE — Progress Notes (Signed)
Occupational Therapy Session Note  Patient Details  Name: Joseph Banks MRN: [id number] Date of Birth: 03-31-1946  Today's Date: 02/08/2021 OT Individual Time: 0800-0900 OT Individual Time Calculation (min): 60 min    Short Term Goals: Week 1:  OT Short Term Goal 1 (Week 1): STG=LTG d/t ELOS  Skilled Therapeutic Interventions/Progress Updates:    Pt received in bathroom, he was able to toilet with mod I and ambulate in his room with RW with mod I.  Pt stated his wife provided S for his shower last night and will do so again tonight.  He had no difficulty getting dressed.  Pt requested to spend time working on [employer]. He wants to be able to make one meal during the day so his spouse does not have to do all 3.  Pt ambulated with Rw to kitchen over 300 ft with S only for distance.  Pt decided to make scrambled eggs. Showed pt where to find items in kitchen. Pt did well ambulating in kitchen without RW. He retrieved eggs, spray oil, pan, spatula, plate, bowl and utensils.   Only cue he needed was to go ahead and turn off burner once he removed hot pan from burner. Pt then washed dishes.    Discussed how to carry laundry from dryer to his bed room.  Had pt practice placing plastic laundry basket on top of his walker and then pushing RW. This worked well for him.  Pt cued to take a few seated rest breaks as he tends to push himself.  Pt ambulated back to his room.  Pt made Mod I in his room with his RW.    Pt did an excellent job and is ready to go home tomorrow.   Therapy Documentation Precautions:  Precautions Precautions: Fall Restrictions Weight Bearing Restrictions: No       Pain: Pain Assessment Pain Score: 0-No pain ADL: ADL Eating: Set up Grooming: Modified independent Where Assessed-Grooming: Standing at sink Upper Body Bathing: Setup Where Assessed-Upper Body Bathing: Shower Lower Body Bathing: Supervision/safety Where Assessed-Lower Body Bathing:  Shower Upper Body Dressing: Independent Where Assessed-Upper Body Dressing: Chair Lower Body Dressing: Modified independent Where Assessed-Lower Body Dressing: Chair Toileting: Modified independent Where Assessed-Toileting: Glass blower/designer: Diplomatic Services operational officer Method: Counselling psychologist: Energy manager: Close supervision Social research officer, government Method: Ambulating  Therapy/Group: Individual Therapy  [place] 02/08/2021, 9:35 AM

## 2021-02-09 MED ORDER — MELATONIN 5 MG PO TABS: 5.0000 mg | ORAL_TABLET | Freq: Every day | ORAL | 0 refills | Status: AC

## 2021-02-09 MED ORDER — LIDOCAINE 5 % EX PTCH
1.0000 | MEDICATED_PATCH | CUTANEOUS | 0 refills | Status: DC
Start: 2021-02-09 — End: 2021-03-10

## 2021-02-09 MED ORDER — FINASTERIDE 5 MG PO TABS: 5.0000 mg | ORAL_TABLET | Freq: Every day | ORAL | 0 refills | Status: AC

## 2021-02-09 MED ORDER — SENNA 8.6 MG PO TABS
1.0000 | ORAL_TABLET | Freq: Every day | ORAL | 0 refills | Status: DC
Start: 2021-02-10 — End: 2021-03-10

## 2021-02-09 MED ORDER — POLYVINYL ALCOHOL 1.4 % OP SOLN: 2.0000 [drp] | OPHTHALMIC | 0 refills | Status: DC | PRN

## 2021-02-09 MED ORDER — LIDOCAINE 5 % EX PTCH: 1.0000 | MEDICATED_PATCH | CUTANEOUS | 0 refills | Status: DC

## 2021-02-09 MED ORDER — TAMSULOSIN HCL 0.4 MG PO CAPS: 0.8000 mg | ORAL_CAPSULE | Freq: Every day | ORAL | 0 refills | Status: DC

## 2021-02-09 MED ORDER — ZOLPIDEM TARTRATE 5 MG PO TABS
5.0000 mg | ORAL_TABLET | Freq: Every day | ORAL | 0 refills | Status: DC
Start: 2021-02-09 — End: 2021-11-03

## 2021-02-10 ENCOUNTER — Telehealth: Payer: Self-pay | Admitting: *Deleted

## 2021-02-10 NOTE — Telephone Encounter (Signed)
Prior auth for zolpidem tartrate 5 mg tablets submitted to [facility] [employer] via CoverMyMeds. Await response.

## 2021-02-11 NOTE — Telephone Encounter (Signed)
Zolpidem was denied. I have let Joseph Banks know he can pay cash for it with GoodRx.Otherwise, he will need to try Doxepin first.

## 2021-02-19 DIAGNOSIS — D692 Other nonthrombocytopenic purpura: Secondary | ICD-10-CM | POA: Diagnosis not present

## 2021-02-19 DIAGNOSIS — I10 Essential (primary) hypertension: Secondary | ICD-10-CM | POA: Diagnosis not present

## 2021-02-19 DIAGNOSIS — E1140 Type 2 diabetes mellitus with diabetic neuropathy, unspecified: Secondary | ICD-10-CM | POA: Diagnosis not present

## 2021-02-19 DIAGNOSIS — Z299 Encounter for prophylactic measures, unspecified: Secondary | ICD-10-CM | POA: Diagnosis not present

## 2021-02-19 DIAGNOSIS — R69 Illness, unspecified: Secondary | ICD-10-CM | POA: Diagnosis not present

## 2021-02-24 DIAGNOSIS — M542 Cervicalgia: Secondary | ICD-10-CM | POA: Diagnosis not present

## 2021-02-26 DIAGNOSIS — M542 Cervicalgia: Secondary | ICD-10-CM | POA: Diagnosis not present

## 2021-03-03 DIAGNOSIS — M542 Cervicalgia: Secondary | ICD-10-CM | POA: Diagnosis not present

## 2021-03-05 DIAGNOSIS — M542 Cervicalgia: Secondary | ICD-10-CM | POA: Diagnosis not present

## 2021-03-10 ENCOUNTER — Other Ambulatory Visit: Payer: Self-pay

## 2021-03-10 ENCOUNTER — Ambulatory Visit: Payer: Medicare HMO | Admitting: Urology

## 2021-03-10 VITALS — BP 128/74 | HR 96

## 2021-03-10 DIAGNOSIS — R339 Retention of urine, unspecified: Secondary | ICD-10-CM | POA: Diagnosis not present

## 2021-03-10 DIAGNOSIS — M542 Cervicalgia: Secondary | ICD-10-CM | POA: Diagnosis not present

## 2021-03-10 DIAGNOSIS — N400 Enlarged prostate without lower urinary tract symptoms: Secondary | ICD-10-CM

## 2021-03-10 DIAGNOSIS — R310 Gross hematuria: Secondary | ICD-10-CM

## 2021-03-10 LAB — URINALYSIS, ROUTINE W REFLEX MICROSCOPIC
Bilirubin, UA: NEGATIVE
Glucose, UA: NEGATIVE
Ketones, UA: NEGATIVE
Leukocytes,UA: NEGATIVE
Nitrite, UA: NEGATIVE
Protein,UA: NEGATIVE
RBC, UA: NEGATIVE
Specific Gravity, UA: 1.025 (ref 1.005–1.030)
Urobilinogen, Ur: 0.2 mg/dL (ref 0.2–1.0)
pH, UA: 5.5 (ref 5.0–7.5)

## 2021-03-10 NOTE — Progress Notes (Signed)
H&P  Chief Complaint: Follow-up of recent hospitalization/urinary retention/gross hematuria  History of Present Illness: Earlier this summer was admitted following resolution of prostatitis.  He was admitted with delirium, gross hematuria and fever.  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  The patient has recovered from that.  He was placed on tamsulosin 2 capsules a day plus his new medicine finasteride.  Since he got out of rehab, he has been voiding better.  No gross hematuria, no dysuria.  No side effects from his medical therapy.  He feels like he is emptying fairly well.  Past Medical History:  Diagnosis Date   BPH (benign prostatic hyperplasia)    Diabetes mellitus without complication [facility])     Past Surgical History:  Procedure Laterality Date   IR FLUORO GUIDED NEEDLE PLC ASPIRATION/INJECTION LOC  01/16/2021   TRANSURETHRAL RESECTION OF PROSTATE      Home Medications:  Allergies as of 03/10/2021   No Known Allergies      Medication List        Accurate as of March 10, 2021  3:39 PM. If you have any questions, ask your nurse or doctor.          finasteride 5 MG tablet Commonly known as: PROSCAR Take 1 tablet (5 mg total) by mouth daily.   gabapentin 600 MG tablet Commonly known as: NEURONTIN Take 1 tablet (600 mg total) by mouth 2 (two) times daily.   lidocaine 5 % Commonly known as: LIDODERM Place 1 patch onto the skin daily. Apply at 8 pm and remove at 8 am --has to be off for 12 hours. Purchase over the counter   melatonin 5 MG Tabs Take 1 tablet (5 mg total) by mouth at bedtime.   metFORMIN 500 MG tablet Commonly known as: GLUCOPHAGE Take 500 mg by mouth 2 (two) times daily.   polyvinyl alcohol 1.4 % ophthalmic solution Commonly known as: LIQUIFILM TEARS Place 2 drops into both eyes as needed for dry eyes.   senna 8.6 MG Tabs tablet Commonly known as: SENOKOT Take 1 tablet (8.6 mg total) by mouth daily. Purchase over the counter   tamsulosin 0.4 MG Caps  capsule Commonly known as: FLOMAX Take 2 capsules (0.8 mg total) by mouth daily after supper.   zolpidem 5 MG tablet Commonly known as: AMBIEN Take 1 tablet (5 mg total) by mouth at bedtime.        Allergies: No Known Allergies  No family history on file.  Social History:  reports that he has quit smoking. He has never used smokeless tobacco. No history on file for alcohol use and drug use.  ROS: A complete review of systems was performed.  All systems are negative except for pertinent findings as noted.  Physical Exam:  Vital signs in last 24 hours: BP 128/74   Pulse 96  Constitutional:  Alert and oriented, No acute distress Cardiovascular: Regular rate  Respiratory: Normal respiratory effort Neurologic: Grossly intact, no focal deficits Psychiatric: Normal mood and affect I have reviewed prior pt notes  I have reviewed notes from referring/previous physicians  I have reviewed urinalysis results  I have reviewed prior PSA results  I have reviewed prior urine culture  Residual urine volume by bladder scan 212 mL   Impression/Assessment:  1.  BPH, large gland, symptomatically improved currently although he does have a moderate residual  2.  Gross hematuria, resolved  3.  Incomplete bladder emptying, patient relatively asymptomatic with his residual  Plan:  1.  For now, continue tamsulosin  0.8 mg as well as finasteride  2.  I will have him come back in about 3 months with bladder scan.  Hopefully, he will have continued improvement in his symptoms with a small residual  3.  I did answer questions about eventual intervention with surgery if necessary-I do not think that is an issue at the present time

## 2021-03-10 NOTE — Progress Notes (Signed)
Urological Symptom Review  Patient is experiencing the following symptoms: Get up at night to urinate   Review of Systems  Gastrointestinal (upper)  : Negative for upper GI symptoms  Gastrointestinal (lower) : Negative for lower GI symptoms  Constitutional : Weight loss  Skin: Negative for skin symptoms  Eyes: Negative for eye symptoms  Ear/Nose/Throat : Negative for Ear/Nose/Throat symptoms  Hematologic/Lymphatic: Negative for Hematologic/Lymphatic symptoms  Cardiovascular : Negative for cardiovascular symptoms  Respiratory : Negative for respiratory symptoms  Endocrine: Negative for endocrine symptoms  Musculoskeletal: Back pain  Neurological: Negative for neurological symptoms  Psychologic: Negative for psychiatric symptoms

## 2021-03-20 ENCOUNTER — Telehealth: Payer: Self-pay | Admitting: Psychiatry

## 2021-03-20 NOTE — Telephone Encounter (Signed)
Next visit is 11/16/21. Joseph Banks is still not sleeping and was in the hospital recently. He wants to know if it is ok to take Ambien 10 mg and the Trazadone 50 mg together. His phone number is [phone].

## 2021-03-20 NOTE — Telephone Encounter (Signed)
Pt informed

## 2021-03-20 NOTE — Telephone Encounter (Signed)
Yes as long as he is not having side effects with the combination then it is okay

## 2021-03-20 NOTE — Telephone Encounter (Signed)
Please review

## 2021-03-23 DIAGNOSIS — Z299 Encounter for prophylactic measures, unspecified: Secondary | ICD-10-CM | POA: Diagnosis not present

## 2021-03-23 DIAGNOSIS — E1165 Type 2 diabetes mellitus with hyperglycemia: Secondary | ICD-10-CM | POA: Diagnosis not present

## 2021-03-23 DIAGNOSIS — I10 Essential (primary) hypertension: Secondary | ICD-10-CM | POA: Diagnosis not present

## 2021-03-24 ENCOUNTER — Ambulatory Visit: Payer: Medicare HMO | Admitting: Urology

## 2021-03-26 DIAGNOSIS — R531 Weakness: Secondary | ICD-10-CM | POA: Diagnosis not present

## 2021-03-26 DIAGNOSIS — M542 Cervicalgia: Secondary | ICD-10-CM | POA: Diagnosis not present

## 2021-03-31 DIAGNOSIS — M542 Cervicalgia: Secondary | ICD-10-CM | POA: Diagnosis not present

## 2021-03-31 DIAGNOSIS — R531 Weakness: Secondary | ICD-10-CM | POA: Diagnosis not present

## 2021-04-02 DIAGNOSIS — M542 Cervicalgia: Secondary | ICD-10-CM | POA: Diagnosis not present

## 2021-04-02 DIAGNOSIS — R531 Weakness: Secondary | ICD-10-CM | POA: Diagnosis not present

## 2021-04-16 DIAGNOSIS — H0102A Squamous blepharitis right eye, upper and lower eyelids: Secondary | ICD-10-CM | POA: Diagnosis not present

## 2021-04-16 DIAGNOSIS — H2701 Aphakia, right eye: Secondary | ICD-10-CM | POA: Diagnosis not present

## 2021-04-16 DIAGNOSIS — H338 Other retinal detachments: Secondary | ICD-10-CM | POA: Diagnosis not present

## 2021-04-16 DIAGNOSIS — H25812 Combined forms of age-related cataract, left eye: Secondary | ICD-10-CM | POA: Diagnosis not present

## 2021-04-16 DIAGNOSIS — H35372 Puckering of macula, left eye: Secondary | ICD-10-CM | POA: Diagnosis not present

## 2021-04-16 DIAGNOSIS — H18421 Band keratopathy, right eye: Secondary | ICD-10-CM | POA: Diagnosis not present

## 2021-04-16 DIAGNOSIS — H0102B Squamous blepharitis left eye, upper and lower eyelids: Secondary | ICD-10-CM | POA: Diagnosis not present

## 2021-05-05 DIAGNOSIS — R69 Illness, unspecified: Secondary | ICD-10-CM | POA: Diagnosis not present

## 2021-05-05 DIAGNOSIS — Z299 Encounter for prophylactic measures, unspecified: Secondary | ICD-10-CM | POA: Diagnosis not present

## 2021-05-05 DIAGNOSIS — G4700 Insomnia, unspecified: Secondary | ICD-10-CM | POA: Diagnosis not present

## 2021-05-05 DIAGNOSIS — I700 Atherosclerosis of aorta: Secondary | ICD-10-CM | POA: Diagnosis not present

## 2021-05-05 DIAGNOSIS — I10 Essential (primary) hypertension: Secondary | ICD-10-CM | POA: Diagnosis not present

## 2021-05-06 ENCOUNTER — Telehealth: Payer: Self-pay | Admitting: Psychiatry

## 2021-05-06 NOTE — Telephone Encounter (Signed)
Okay schedule 930 Friday morning

## 2021-05-06 NOTE — Telephone Encounter (Signed)
Joseph Banks called to try to get in asap. He is not doing well.Having anxiety and panic attacks. No appts avail except work-ins times. Can we schedule a work in?

## 2021-05-06 NOTE — Telephone Encounter (Signed)
Please review

## 2021-05-07 ENCOUNTER — Other Ambulatory Visit: Payer: Self-pay | Admitting: Psychiatry

## 2021-05-07 ENCOUNTER — Telehealth: Payer: Self-pay

## 2021-05-07 MED ORDER — LORAZEPAM 0.5 MG PO TABS: 0.5000 mg | ORAL_TABLET | Freq: Three times a day (TID) | ORAL | 0 refills | Status: DC

## 2021-05-07 NOTE — Telephone Encounter (Signed)
[facility] in [place] LVM stating that they got the script for 6 pills of Ativan.  But they noticed that he had gotten Xanax from another provider?Marland KitchenMarland KitchenMarland KitchenThey want someone to call them back and confirm it's ok to give him the Ativan.  Pharmacy [phone]

## 2021-05-07 NOTE — Telephone Encounter (Signed)
Please let patient know that I sent an Ativan for anxiety to the [facility] that he has used in the past.  Let him know that he must come in for an appointment either this Friday or next week or I will not continue to prescribe medicine for him

## 2021-05-07 NOTE — Telephone Encounter (Signed)
Contacted pt to check his anxiety and if he picked up Rx from his primary doctor Dr. Woody Seller of Alprazolam 0.25 mg #10. Pt did pick up on Tuesday and has been taking twice daily, reports it does help but he has to take 2/day. He reports he will run out soon. Advised him he has apt with Dr. Clovis Pu in the morning for telehealth and they can discuss his anxiety and medication. He verbalized understanding and didn't need anymore medication sent at this time. Pharmacy was notified and Rx canceled.  Pt reports there has been a lot happen since he saw Dr. Clovis Pu last and wanted me to update him prior to his apt.   Pt reports he was hospitalized from July 4th-August 1st, in ICU for 2 weeks, then recovered the next 2 weeks. He had multiple issues happen Prostatitis, Meningitis, UTI, Encephalitis, then Sepsi. He reports he almost didn't make it. He has lost 40 pounds, he had to go to rehab and learn how to walk again. He felt pretty good in August and Sept but on October 17th he started having anxiety with stomach in knots and skin burning. He reports when he wakes up it's really bad and hard to get out of bed. Once he takes a Alprazolam he feels better. He even went to the [facility] today to work out. He then again feels the anxiety around dinner time and takes another Alprazolam.  He also reported his medication which look like they are updated in chart but he listed them. Metformin 500 mg bid Gabapentin 600 mg tid, Flomax 0.4 mg bid,  Proscar 5 mg Q hs, Ambien 10 mg Q hs, Melatonin 5 mg  Informed him Dr. Clovis Pu would have my note prior to his apt at 9:30 am Telehealth.

## 2021-05-07 NOTE — Progress Notes (Signed)
May 07, 2021 Joseph Banks   CJ   9:25 AM Note Pt wants something for anxiety today! He says tomorrow morning is too long to wait. He added that he was hospitalized for about a month in August/Sept with miningitis and other issues. He has had very bad anxiety for the last ten days- mounting. His stomach is upset and skin feels like it is crawling. Can't wait until tomorrow to get med relief.    Sent in prescription for Ativan 0.5 mg tablets 1 3 times daily #6 no refills.  We will offer appointment for tomorrow morning.

## 2021-05-07 NOTE — Telephone Encounter (Signed)
Please review previous message are you aware of the xanax?

## 2021-05-07 NOTE — Telephone Encounter (Signed)
Called pharmacy and cancelled Rx for lorazepam.

## 2021-05-07 NOTE — Telephone Encounter (Signed)
Pt wants something for anxiety today! He says tomorrow morning is too long to wait. He added that he was hospitalized for about a month in August/Sept with miningitis and other issues. He has had very bad anxiety for the last ten days- mounting. His stomach is upset and skin feels like it is crawling. Can't wait until tomorrow to get med relief.

## 2021-05-08 ENCOUNTER — Ambulatory Visit (INDEPENDENT_AMBULATORY_CARE_PROVIDER_SITE_OTHER): Payer: Medicare HMO | Admitting: Psychiatry

## 2021-05-08 ENCOUNTER — Encounter: Payer: Self-pay | Admitting: Psychiatry

## 2021-05-08 DIAGNOSIS — F411 Generalized anxiety disorder: Secondary | ICD-10-CM

## 2021-05-08 DIAGNOSIS — R69 Illness, unspecified: Secondary | ICD-10-CM | POA: Diagnosis not present

## 2021-05-08 DIAGNOSIS — F314 Bipolar disorder, current episode depressed, severe, without psychotic features: Secondary | ICD-10-CM | POA: Diagnosis not present

## 2021-05-08 DIAGNOSIS — Z79899 Other long term (current) drug therapy: Secondary | ICD-10-CM

## 2021-05-08 MED ORDER — ALPRAZOLAM 0.25 MG PO TABS: 0.2500 mg | ORAL_TABLET | Freq: Three times a day (TID) | ORAL | 1 refills | Status: DC | PRN

## 2021-05-08 MED ORDER — LITHIUM CARBONATE 600 MG PO CAPS: 600.0000 mg | ORAL_CAPSULE | Freq: Every evening | ORAL | 1 refills | Status: DC

## 2021-05-08 MED ORDER — DULOXETINE HCL 30 MG PO CPEP: 30.0000 mg | ORAL_CAPSULE | Freq: Every day | ORAL | 0 refills | Status: DC

## 2021-05-08 NOTE — Progress Notes (Signed)
Joseph Banks [id number] 1946-05-10 75 y.o.   Virtual Visit via Telephone Note  I connected with pt by telephone and verified that I am speaking with the correct person using two identifiers.   I discussed the limitations, risks, security and privacy concerns of performing an evaluation and management service by telephone and the availability of in person appointments. I also discussed with the patient that there may be a patient responsible charge related to this service. The patient expressed understanding and agreed to proceed.  I discussed the assessment and treatment plan with the patient. The patient was provided an opportunity to ask questions and all were answered. The patient agreed with the plan and demonstrated an understanding of the instructions.   The patient was advised to call back or seek an in-person evaluation if the symptoms worsen or if the condition fails to improve as anticipated.  I provided 25 minutes of non-face-to-face time during this encounter. The call started at 935 and ended at 10:00. The patient was located at home and the provider was located office.   Subjective:   Patient ID:  Joseph Banks is a 75 y.o. (DOB April 08, 1946) male.  Chief Complaint:  Chief Complaint  Patient presents with   Follow-up   Bipolar II disorder    Anxiety   Depression    Medication Refill Associated symptoms include fatigue. Pertinent negatives include no weakness.  Anxiety Symptoms include decreased concentration, dizziness and nervous/anxious behavior. Patient reports no confusion or suicidal ideas.    Joseph Banks presents to the office today for follow-up of depression and anxiety.    In 2019 August.   Changed lithium slightly to 600 mg and increased the gabapentin to 600 BID.    visit in March 2020 he was doing well and was more compliant with medication with a good response.  No meds were changed.  seen September2020 without  Med changes.  Main problems  is disrupted sleep with some awakening and frequent periods of staying awake for a little while thereafter.  No depression but don't sleep well with nocturia and neuropathy with toes tingling.  PCP prescribed Lyrica for neuropathy.  Tingling is very uncomfortable at times.  Not taking any gabapentin in pm.  A little dizzy and nausea after morning meds when takes most everything.  09/2019 appt was well emotionally but sleep problems and neuropathy.   Plan:Since his neuropathy is not manage he needs to return to the previous dosage.  Start  600 mg twice daily to help neuropathy at night.   Check lihtium level.  03/24/20 appt with the following noted: Never got lithium level and stopped psych meds lithium and duloxetine AMA. "I had so many pills I was taking" for DM and prostate and decided he wanted to stop things.  Stopped April 1.  Denies depression or mood swings.  CO nocturia. Had called and increased trazodone for awhile. Taking gabapentin and pregabalin. Plan: Recommend he discuss with his primary care doctor gabapentin versus Lyrica.  Typically they are not taken together.  Discussed increased risk of side effects related to this. Continue duloxetine.  12/22/2020 appointment with the following noted: Stomach virus with Covid Neg Continues gabapentin and stopped Lyrica.  Also stopped Cymbalta and lithium.  Taking trazodone. Occ irritable. But overall mood is fine. Mood good.  No depressive episodes.  Patient reports stable mood and denies depressed or irritable moods.  Patient denies any recent difficulty with anxiety.  Patient denies difficulty with sleep initiation but is  interrrupted with with  nocturia but normal quantity. Denies appetite disturbance.  Patient reports that energy and motivation have been good.  Patient denies any difficulty with concentration.  Patient denies any suicidal ideation. Plan: Resume lithium 600 mg nightly rec lithium level as soon as possible. Refuses mood  stabilizing psych meds bc no sx currently including lithium.  05/08/21 appt noted: In hosp for almost a month and got prostatitis, meningitis and sepsis and lost 40#, July4- Aug 1.  OK Aug and Sept.  In October started feeling bad with tension, burning skin. Lost appetite.  Anxiety came first.  Low energy, motivation, concentration and tend to want to stay in bed. PCP gave him Xanax 0.25 mg helped minimally.  Drowsy from the anxiety.   He called his primary care doctor and our office within the last couple of days asking for something for anxiety.  His primary care doctor gave him alprazolam 0.25 mg which he has been taking twice daily with a little benefit.  States he has some depression due to the severity of the anxiety and the other symptoms that he is noticing.  He denies manic symptoms.   Past Psychiatric Medication Trials: Citalopram no response paroxetine 40 with benefit, mirtazapine, duloxetine 90+ lithium with response, aripiprazole 10, lithium 900, trazodone with benefit, alprazolam, buspirone  Review of Systems:  Review of Systems  Constitutional:  Positive for activity change, appetite change, fatigue and unexpected weight change.  Genitourinary:  Positive for frequency.       Nocturia  Neurological:  Positive for dizziness. Negative for tremors and weakness.       Tingling in toes  Psychiatric/Behavioral:  Positive for decreased concentration and dysphoric mood. Negative for agitation, behavioral problems, confusion, hallucinations, self-injury, sleep disturbance and suicidal ideas. The patient is nervous/anxious. The patient is not hyperactive.    Medications: I have reviewed the patient's current medications.  Current Outpatient Medications  Medication Sig Dispense Refill   ALPRAZolam (XANAX) 0.25 MG tablet Take 1 tablet (0.25 mg total) by mouth 3 (three) times daily as needed for anxiety. 60 tablet 1   DULoxetine (CYMBALTA) 30 MG capsule Take 1 capsule (30 mg total) by mouth  daily. 30 capsule 0   finasteride (PROSCAR) 5 MG tablet Take 1 tablet (5 mg total) by mouth daily. 30 tablet 0   gabapentin (NEURONTIN) 600 MG tablet Take 1 tablet (600 mg total) by mouth 2 (two) times daily. 180 tablet 2   lithium 600 MG capsule Take 1 capsule (600 mg total) by mouth at bedtime. 30 capsule 1   LORazepam (ATIVAN) 0.5 MG tablet Take 1 tablet (0.5 mg total) by mouth every 8 (eight) hours. 6 tablet 0   melatonin 5 MG TABS Take 1 tablet (5 mg total) by mouth at bedtime. 30 tablet 0   metFORMIN (GLUCOPHAGE) 500 MG tablet Take 500 mg by mouth 2 (two) times daily.     tamsulosin (FLOMAX) 0.4 MG CAPS capsule Take 2 capsules (0.8 mg total) by mouth daily after supper. 60 capsule 0   zolpidem (AMBIEN) 5 MG tablet Take 1 tablet (5 mg total) by mouth at bedtime. (Patient taking differently: Take 10 mg by mouth at bedtime.) 30 tablet 0   No current facility-administered medications for this visit.    Medication Side Effects: None  Allergies: No Known Allergies  Past Medical History:  Diagnosis Date   BPH (benign prostatic hyperplasia)    Diabetes mellitus without complication ([place])     History reviewed. No pertinent family  history.  Social History   Socioeconomic History   Marital status: Married    Spouse name: Not on file   Number of children: Not on file   Years of education: Not on file   Highest education level: Not on file  Occupational History   Not on file  Tobacco Use   Smoking status: Former   Smokeless tobacco: Never  Substance and Sexual Activity   Alcohol use: Not on file   Drug use: Not on file   Sexual activity: Not on file  Other Topics Concern   Not on file  Social History Narrative   Not on file   Social Determinants of Health   Financial Resource Strain: Not on file  Food Insecurity: Not on file  Transportation Needs: Not on file  Physical Activity: Not on file  Stress: Not on file  Social Connections: Not on file  Intimate Partner  Violence: Not on file    Past Medical History, Surgical history, Social history, and Family history were reviewed and updated as appropriate.   Please see review of systems for further details on the patient's review from today.   Objective:   Physical Exam:  There were no vitals taken for this visit.  Physical Exam Neurological:     Mental Status: He is alert and oriented to person, place, and time.     Cranial Nerves: No dysarthria.  Psychiatric:        Attention and Perception: Attention normal. He is attentive. He does not perceive auditory hallucinations.        Mood and Affect: Mood is anxious and depressed. Affect is labile.        Speech: Speech normal. Speech is not rapid and pressured.        Behavior: Behavior is cooperative.        Thought Content: Thought content normal. Thought content is not paranoid or delusional. Thought content does not include homicidal or suicidal ideation. Thought content does not include homicidal or suicidal plan.        Cognition and Memory: Cognition and memory normal.        Judgment: Judgment normal.     Comments: Insight fair.  Historically this is been a problem including now.  Rigid thought patterns    Lab Review:     Component Value Date/Time   NA 134 (L) 02/05/2021 0539   K 4.3 02/05/2021 0539   CL 103 02/05/2021 0539   CO2 24 02/05/2021 0539   GLUCOSE 126 (H) 02/05/2021 0539   BUN 16 02/05/2021 0539   CREATININE 1.01 02/05/2021 0539   CALCIUM 9.7 02/05/2021 0539   PROT 7.1 02/02/2021 0712   ALBUMIN 3.1 (L) 02/02/2021 0712   AST 30 02/02/2021 0712   ALT 34 02/02/2021 0712   ALKPHOS 46 02/02/2021 0712   BILITOT 0.7 02/02/2021 0712   GFRNONAA >60 02/05/2021 0539       Component Value Date/Time   WBC 5.0 02/05/2021 0539   RBC 3.50 (L) 02/05/2021 0539   HGB 10.9 (L) 02/05/2021 0539   HCT 32.5 (L) 02/05/2021 0539   PLT 470 (H) 02/05/2021 0539   MCV 92.9 02/05/2021 0539   MCH 31.1 02/05/2021 0539   MCHC 33.5  02/05/2021 0539   RDW 15.3 02/05/2021 0539   LYMPHSABS 2.5 02/05/2021 0539   MONOABS 0.6 02/05/2021 0539   EOSABS 0.2 02/05/2021 0539   BASOSABS 0.0 02/05/2021 0539    No results found for: POCLITH, LITHIUM   No results found  for: PHENYTOIN, PHENOBARB, VALPROATE, CBMZ   Labs from August 21, 2018 included BMP which was normal including calcium 9.4 and creatinine 1.2, TSH 1.85 normal, lithium 0.68 stable  .res Assessment: Plan:    Severe bipolar I disorder, current or most recent episode depressed ([place]) - Plan: DULoxetine (CYMBALTA) 30 MG capsule, lithium 600 MG capsule  Generalized anxiety disorder - Plan: ALPRAZolam (XANAX) 0.25 MG tablet, DULoxetine (CYMBALTA) 30 MG capsule  Lithium use   This was an urgent emergency appointment which the patient requested because of severe anxiety symptoms and difficulty eating.  We discussed Importance of consistency emphasized bc history or problems causing relapse.     He has had no significant mood swings or depressive bouts since he was last here until recently.Marland Kitchen  However his history is consistent with bipolar disorder type II.  He stopped his medications AMA in 2021.  He was informed he would likely have a relapse and he has relapsed.  He had a good response on duloxetine 30 mg plus lithium 600 mg daily in the past and had maintained some period of stability on that combination.  He has failed other medications as noted.  It makes sense to return to what worked before rather than experimenting with new medications.  It was explained to him he is likely to need a benzodiazepine in the short-term because he will not get a quick response from lithium and duloxetine most likely.  He would prefer to continue with the alprazolam Increase alprazolam to 0.25 mg 3 times daily as needed Start duloxetine 30 mg daily Resume lithium 600 mg nightly   Continues gabapentin and likes it.  No Lyrica  Recommend follow-up in 6 weeks  Lynder Parents, MD,  DFAPA   Please see After Visit Summary for patient specific instructions.  Future Appointments  Date Time Provider [place]  06/16/2021  2:15 PM Franchot Gallo, MD [facility] None  11/16/2021 10:00 AM Cottle, Billey Co., MD [facility] None    No orders of the defined types were placed in this encounter.     -------------------------------

## 2021-06-10 ENCOUNTER — Other Ambulatory Visit: Payer: Self-pay | Admitting: Psychiatry

## 2021-06-10 DIAGNOSIS — F314 Bipolar disorder, current episode depressed, severe, without psychotic features: Secondary | ICD-10-CM

## 2021-06-10 DIAGNOSIS — F411 Generalized anxiety disorder: Secondary | ICD-10-CM

## 2021-06-16 ENCOUNTER — Ambulatory Visit (INDEPENDENT_AMBULATORY_CARE_PROVIDER_SITE_OTHER): Payer: Medicare HMO | Admitting: Urology

## 2021-06-16 ENCOUNTER — Encounter: Payer: Self-pay | Admitting: Urology

## 2021-06-16 ENCOUNTER — Other Ambulatory Visit: Payer: Self-pay

## 2021-06-16 VITALS — BP 133/64 | HR 67

## 2021-06-16 DIAGNOSIS — R300 Dysuria: Secondary | ICD-10-CM | POA: Diagnosis not present

## 2021-06-16 DIAGNOSIS — N400 Enlarged prostate without lower urinary tract symptoms: Secondary | ICD-10-CM

## 2021-06-16 DIAGNOSIS — R339 Retention of urine, unspecified: Secondary | ICD-10-CM | POA: Diagnosis not present

## 2021-06-16 DIAGNOSIS — N5201 Erectile dysfunction due to arterial insufficiency: Secondary | ICD-10-CM | POA: Diagnosis not present

## 2021-06-16 DIAGNOSIS — R310 Gross hematuria: Secondary | ICD-10-CM

## 2021-06-16 LAB — URINALYSIS, ROUTINE W REFLEX MICROSCOPIC
Bilirubin, UA: NEGATIVE
Glucose, UA: NEGATIVE
Ketones, UA: NEGATIVE
Leukocytes,UA: NEGATIVE
Nitrite, UA: NEGATIVE
Protein,UA: NEGATIVE
RBC, UA: NEGATIVE
Specific Gravity, UA: 1.02 (ref 1.005–1.030)
Urobilinogen, Ur: 1 mg/dL (ref 0.2–1.0)
pH, UA: 6.5 (ref 5.0–7.5)

## 2021-06-16 NOTE — Progress Notes (Signed)
History of Present Illness:   8.27.2019: On avodart for about 1.5 years. Had TURP 2013.     2.11.2020: He is now off of Avodart. Most recent PSA is 6 which is appropriate for him being off of that. He still has nocturia x3 but has not managed his behavior i.e. decreasing fluids at night, limiting sodium or wearing compression socks    10.13.2020: He returns today for follow-up now reporting significantly worsened urinary sx's (IPSS 6 --> 25).    6.7.2022: He still takes tamsulosin-sometimes 1 a day, sometimes 2.  He does still have bothersome urinary symptoms.  IPSS 21, quality-of-life score 4.   Elevated PSA:    He has had 3 prior TRUS/biopsies. The first was in about 2005. Done by 3 separate urologists--in [place], [place] [employer] and [place]. Prior to biopsies PSA range was 9 to 11.    2.11.2020:  PSA 6.0. He is now off of Avodart.  10.13.2020: PSA 6.82.   2.9.2021: 9.4  12.6.2022: He was treated for acute prostatitis in the summer 2022.  Another admission following that was for gross hematuria.  Patient ended up having encephalitis.  He did have issues with voiding, was increased to tamsulosin 2 capsules daily as well as finasteride.  He has had no real urinary issues since his last visit.  No gross hematuria, no dysuria.  On 2 tamsulosin a day, also taking finasteride.  IPSS 4, quality-of-life score 2  Past Medical History:  Diagnosis Date   BPH (benign prostatic hyperplasia)    Diabetes mellitus without complication ([place])     Past Surgical History:  Procedure Laterality Date   IR FLUORO GUIDED NEEDLE PLC ASPIRATION/INJECTION LOC  01/16/2021   TRANSURETHRAL RESECTION OF PROSTATE      Home Medications:  Allergies as of 06/16/2021   No Known Allergies      Medication List        Accurate as of June 16, 2021  8:24 AM. If you have any questions, ask your nurse or doctor.          ALPRAZolam 0.25 MG tablet Commonly known as: XANAX Take 1 tablet (0.25 mg total) by  mouth 3 (three) times daily as needed for anxiety.   DULoxetine 30 MG capsule Commonly known as: CYMBALTA Take 1 capsule by mouth once daily   finasteride 5 MG tablet Commonly known as: PROSCAR Take 1 tablet (5 mg total) by mouth daily.   gabapentin 600 MG tablet Commonly known as: NEURONTIN Take 1 tablet (600 mg total) by mouth 2 (two) times daily.   lithium 600 MG capsule Take 1 capsule (600 mg total) by mouth at bedtime.   LORazepam 0.5 MG tablet Commonly known as: ATIVAN Take 1 tablet (0.5 mg total) by mouth every 8 (eight) hours.   melatonin 5 MG Tabs Take 1 tablet (5 mg total) by mouth at bedtime.   metFORMIN 500 MG tablet Commonly known as: GLUCOPHAGE Take 500 mg by mouth 2 (two) times daily.   tamsulosin 0.4 MG Caps capsule Commonly known as: FLOMAX Take 2 capsules (0.8 mg total) by mouth daily after supper.   zolpidem 5 MG tablet Commonly known as: AMBIEN Take 1 tablet (5 mg total) by mouth at bedtime. What changed: how much to take        Allergies: No Known Allergies  No family history on file.  Social History:  reports that he has quit smoking. He has never used smokeless tobacco. No history on file for alcohol use  and drug use.  ROS: A complete review of systems was performed.  All systems are negative except for pertinent findings as noted.  Physical Exam:  Vital signs in last 24 hours: There were no vitals taken for this visit. Constitutional:  Alert and oriented, No acute distress Cardiovascular: Regular rate  Respiratory: Normal respiratory effort GI: Abdomen is soft, nontender, nondistended, no abdominal masses. No CVAT.  Genitourinary: Normal male phallus, testes are descended bilaterally and non-tender and without masses, scrotum is normal in appearance without lesions or masses, perineum is normal on inspection. Lymphatic: No lymphadenopathy Neurologic: Grossly intact, no focal deficits Psychiatric: Normal mood and affect  I have  reviewed prior pt notes  I have reviewed urinalysis results  I have independently reviewed prior imaging  I have reviewed prior PSA results  I have reviewed prior urine culture  Bladder scan volume today 227 mL.   Impression/Assessment:  1.  History of gross hematuria, resolved with finasteride.  Felt to be of prostatic source  2.  BPH with huge gland, fairly stable symptoms on dual medical therapy  3.  Elevated PSA with negative biopsies  Plan:  1.  Continue finasteride but try going down to 1 tamsulosin a day  2.  I will see back in about 4 months for routine check

## 2021-06-16 NOTE — Progress Notes (Signed)

## 2021-07-02 ENCOUNTER — Other Ambulatory Visit: Payer: Self-pay | Admitting: Psychiatry

## 2021-07-02 DIAGNOSIS — F314 Bipolar disorder, current episode depressed, severe, without psychotic features: Secondary | ICD-10-CM

## 2021-07-08 DIAGNOSIS — G4700 Insomnia, unspecified: Secondary | ICD-10-CM | POA: Diagnosis not present

## 2021-07-08 DIAGNOSIS — Z299 Encounter for prophylactic measures, unspecified: Secondary | ICD-10-CM | POA: Diagnosis not present

## 2021-07-08 DIAGNOSIS — E1165 Type 2 diabetes mellitus with hyperglycemia: Secondary | ICD-10-CM | POA: Diagnosis not present

## 2021-07-08 DIAGNOSIS — I10 Essential (primary) hypertension: Secondary | ICD-10-CM | POA: Diagnosis not present

## 2021-07-08 DIAGNOSIS — Z713 Dietary counseling and surveillance: Secondary | ICD-10-CM | POA: Diagnosis not present

## 2021-07-16 ENCOUNTER — Telehealth: Payer: Medicare HMO | Admitting: Psychiatry

## 2021-07-16 ENCOUNTER — Encounter: Payer: Self-pay | Admitting: Psychiatry

## 2021-07-16 ENCOUNTER — Telehealth: Payer: Self-pay | Admitting: Psychiatry

## 2021-07-16 ENCOUNTER — Other Ambulatory Visit: Payer: Self-pay

## 2021-07-16 DIAGNOSIS — F411 Generalized anxiety disorder: Secondary | ICD-10-CM | POA: Diagnosis not present

## 2021-07-16 DIAGNOSIS — F314 Bipolar disorder, current episode depressed, severe, without psychotic features: Secondary | ICD-10-CM

## 2021-07-16 DIAGNOSIS — Z79899 Other long term (current) drug therapy: Secondary | ICD-10-CM | POA: Diagnosis not present

## 2021-07-16 DIAGNOSIS — R69 Illness, unspecified: Secondary | ICD-10-CM | POA: Diagnosis not present

## 2021-07-16 DIAGNOSIS — F5105 Insomnia due to other mental disorder: Secondary | ICD-10-CM | POA: Diagnosis not present

## 2021-07-16 MED ORDER — DULOXETINE HCL 40 MG PO CPEP: 40.0000 mg | ORAL_CAPSULE | Freq: Every day | ORAL | 0 refills | Status: DC

## 2021-07-16 MED ORDER — DULOXETINE HCL 40 MG PO CPEP: 30.0000 mg | ORAL_CAPSULE | Freq: Every day | ORAL | 0 refills | Status: DC

## 2021-07-16 NOTE — Telephone Encounter (Signed)
Rx sent 

## 2021-07-16 NOTE — Progress Notes (Signed)
Joseph Banks [id number] 04-Sep-1945 76 y.o.   Virtual Visit via Telephone Note  I connected with pt by telephone and verified that I am speaking with the correct person using two identifiers.   I discussed the limitations, risks, security and privacy concerns of performing an evaluation and management service by telephone and the availability of in person appointments. I also discussed with the patient that there may be a patient responsible charge related to this service. The patient expressed understanding and agreed to proceed.  I discussed the assessment and treatment plan with the patient. The patient was provided an opportunity to ask questions and all were answered. The patient agreed with the plan and demonstrated an understanding of the instructions.   The patient was advised to call back or seek an in-person evaluation if the symptoms worsen or if the condition fails to improve as anticipated.  I provided 30 minutes of non-face-to-face time during this encounter.  The patient was located at home and the provider was located office. Session from 10 AM to 1030.  Subjective:   Patient ID:  Joseph Banks is a 76 y.o. (DOB Jan 02, 1946) male.  Chief Complaint:  Chief Complaint  Patient presents with   Follow-up    Bipolar II disorder (Joseph Banks)   Depression   Anxiety   Sleeping Problem    Medication Refill Pertinent negatives include no fatigue or weakness.  Anxiety Symptoms include decreased concentration, dizziness and nervous/anxious behavior. Patient reports no confusion or suicidal ideas.    Depression        Associated symptoms include decreased concentration.  Associated symptoms include no fatigue, no appetite change and no suicidal ideas. Joseph Banks presents to the office today for follow-up of depression and anxiety.    In 2019 August.   Changed lithium slightly to 600 mg and increased the gabapentin to 600 BID.    visit in March 2020 he was doing well and  was more compliant with medication with a good response.  No meds were changed.  seen September2020 without  Med changes.  Main problems is disrupted sleep with some awakening and frequent periods of staying awake for a little while thereafter.  No depression but don't sleep well with nocturia and neuropathy with toes tingling.  PCP prescribed Lyrica for neuropathy.  Tingling is very uncomfortable at times.  Not taking any gabapentin in pm.  A little dizzy and nausea after morning meds when takes most everything.  09/2019 appt was well emotionally but sleep problems and neuropathy.   Plan:Since his neuropathy is not manage he needs to return to the previous dosage.  Start  600 mg twice daily to help neuropathy at night.   Check lihtium level.  03/24/20 appt with the following noted: Never got lithium level and stopped psych meds lithium and duloxetine AMA. "I had so many pills I was taking" for DM and prostate and decided he wanted to stop things.  Stopped April 1.  Denies depression or mood swings.  CO nocturia. Had called and increased trazodone for awhile. Taking gabapentin and pregabalin. Plan: Recommend he discuss with his primary care doctor gabapentin versus Lyrica.  Typically they are not taken together.  Discussed increased risk of side effects related to this. Continue duloxetine.  12/22/2020 appointment with the following noted: Stomach virus with Covid Neg Continues gabapentin and stopped Lyrica.  Also stopped Cymbalta and lithium.  Taking trazodone. Occ irritable. But overall mood is fine. Mood good.  No depressive episodes.  Patient  reports stable mood and denies depressed or irritable moods.  Patient denies any recent difficulty with anxiety.  Patient denies difficulty with sleep initiation but is interrrupted with with  nocturia but normal quantity. Denies appetite disturbance.  Patient reports that energy and motivation have been good.  Patient denies any difficulty with  concentration.  Patient denies any suicidal ideation. Plan: Resume lithium 600 mg nightly rec lithium level as soon as possible. Refuses mood stabilizing psych meds bc no sx currently including lithium.  05/08/21 appt noted: In hosp for almost a month and got prostatitis, meningitis and sepsis and lost 40#, July4- Aug 1.  OK Aug and Sept.  In October started feeling bad with tension, burning skin. Lost appetite.  Anxiety came first.  Low energy, motivation, concentration and tend to want to stay in bed. PCP gave him Xanax 0.25 mg helped minimally.  Drowsy from the anxiety.   He called his primary care doctor and our office within the last couple of days asking for something for anxiety.  His primary care doctor gave him alprazolam 0.25 mg which he has been taking twice daily with a little benefit.  States he has some depression due to the severity of the anxiety and the other symptoms that he is noticing.  He denies manic symptoms. Plan: It was explained to him he is likely to need a benzodiazepine in the short-term because he will not get a quick response from lithium and duloxetine most likely.  He would prefer to continue with the alprazolam Increase alprazolam to 0.25 mg 3 times daily as needed Start duloxetine 30 mg daily Resume lithium 600 mg nightly   07/16/2021 appointment with the following noted Burning skin and anxiety mostly gone.  Depression about 80% better. Not taking much Xanax. Asked about Ativan. Consistent with meds. No SE with meds. PCP switched from Ambien to Lunesta 3 mg HS about a week ago.  May feel a little better during the day but not sleeping quite as well at night.  Had been on Ambien since July.Marland Kitchen Appetite has returned after losing 40#. Slight dizziness and is better.  Past Psychiatric Medication Trials: Citalopram no response paroxetine 40 with benefit, mirtazapine, duloxetine 90+ lithium with response, aripiprazole 10, lithium 900, trazodone with benefit, alprazolam,  buspirone  Review of Systems:  Review of Systems  Constitutional:  Negative for activity change, appetite change, fatigue and unexpected weight change.  Genitourinary:  Positive for frequency.       Nocturia  Neurological:  Positive for dizziness. Negative for tremors and weakness.       Tingling in toes  Psychiatric/Behavioral:  Positive for decreased concentration, depression and dysphoric mood. Negative for agitation, behavioral problems, confusion, hallucinations, self-injury, sleep disturbance and suicidal ideas. The patient is nervous/anxious. The patient is not hyperactive.    Medications: I have reviewed the patient's current medications.  Current Outpatient Medications  Medication Sig Dispense Refill   eszopiclone 3 MG TABS Take 3 mg by mouth at bedtime. Take immediately before bedtime     finasteride (PROSCAR) 5 MG tablet Take 1 tablet (5 mg total) by mouth daily. 30 tablet 0   gabapentin (NEURONTIN) 600 MG tablet Take 1 tablet (600 mg total) by mouth 2 (two) times daily. 180 tablet 2   lithium 600 MG capsule Take 1 capsule by mouth at bedtime 90 capsule 0   melatonin 5 MG TABS Take 1 tablet (5 mg total) by mouth at bedtime. 30 tablet 0   metFORMIN (GLUCOPHAGE) 500 MG  tablet Take 500 mg by mouth 2 (two) times daily.     tamsulosin (FLOMAX) 0.4 MG CAPS capsule Take 2 capsules (0.8 mg total) by mouth daily after supper. 60 capsule 0   ALPRAZolam (XANAX) 0.25 MG tablet Take 1 tablet (0.25 mg total) by mouth 3 (three) times daily as needed for anxiety. (Patient not taking: Reported on 07/16/2021) 60 tablet 1   DULoxetine 40 MG CPEP Take 30 mg by mouth daily. 90 capsule 0   zolpidem (AMBIEN) 5 MG tablet Take 1 tablet (5 mg total) by mouth at bedtime. (Patient not taking: Reported on 07/16/2021) 30 tablet 0   No current facility-administered medications for this visit.    Medication Side Effects: None  Allergies: No Known Allergies  Past Medical History:  Diagnosis Date   BPH  (benign prostatic hyperplasia)    Diabetes mellitus without complication ([place])     History reviewed. No pertinent family history.  Social History   Socioeconomic History   Marital status: Married    Spouse name: Not on file   Number of children: Not on file   Years of education: Not on file   Highest education level: Not on file  Occupational History   Not on file  Tobacco Use   Smoking status: Former   Smokeless tobacco: Never  Substance and Sexual Activity   Alcohol use: Not on file   Drug use: Not on file   Sexual activity: Not on file  Other Topics Concern   Not on file  Social History Narrative   Not on file   Social Determinants of Health   Financial Resource Strain: Not on file  Food Insecurity: Not on file  Transportation Needs: Not on file  Physical Activity: Not on file  Stress: Not on file  Social Connections: Not on file  Intimate Partner Violence: Not on file    Past Medical History, Surgical history, Social history, and Family history were reviewed and updated as appropriate.   Please see review of systems for further details on the patient's review from today.   Objective:   Physical Exam:  There were no vitals taken for this visit.  Physical Exam Neurological:     Mental Status: He is alert and oriented to person, place, and time.     Cranial Nerves: No dysarthria.  Psychiatric:        Attention and Perception: Attention normal. He is attentive. He does not perceive auditory hallucinations.        Mood and Affect: Mood is anxious and depressed. Affect is not labile.        Speech: Speech normal. Speech is not rapid and pressured.        Behavior: Behavior is cooperative.        Thought Content: Thought content normal. Thought content is not paranoid or delusional. Thought content does not include homicidal or suicidal ideation. Thought content does not include homicidal or suicidal plan.        Cognition and Memory: Cognition and memory  normal.        Judgment: Judgment normal.     Comments: Insight fair.  Historically this is been a problem including now.   Much better not resolved    Lab Review:     Component Value Date/Time   NA 134 (L) 02/05/2021 0539   K 4.3 02/05/2021 0539   CL 103 02/05/2021 0539   CO2 24 02/05/2021 0539   GLUCOSE 126 (H) 02/05/2021 0539   BUN 16 02/05/2021  0539   CREATININE 1.01 02/05/2021 0539   CALCIUM 9.7 02/05/2021 0539   PROT 7.1 02/02/2021 0712   ALBUMIN 3.1 (L) 02/02/2021 0712   AST 30 02/02/2021 0712   ALT 34 02/02/2021 0712   ALKPHOS 46 02/02/2021 0712   BILITOT 0.7 02/02/2021 0712   GFRNONAA >60 02/05/2021 0539       Component Value Date/Time   WBC 5.0 02/05/2021 0539   RBC 3.50 (L) 02/05/2021 0539   HGB 10.9 (L) 02/05/2021 0539   HCT 32.5 (L) 02/05/2021 0539   PLT 470 (H) 02/05/2021 0539   MCV 92.9 02/05/2021 0539   MCH 31.1 02/05/2021 0539   MCHC 33.5 02/05/2021 0539   RDW 15.3 02/05/2021 0539   LYMPHSABS 2.5 02/05/2021 0539   MONOABS 0.6 02/05/2021 0539   EOSABS 0.2 02/05/2021 0539   BASOSABS 0.0 02/05/2021 0539    No results found for: POCLITH, LITHIUM   No results found for: PHENYTOIN, PHENOBARB, VALPROATE, CBMZ   Labs from August 21, 2018 included BMP which was normal including calcium 9.4 and creatinine 1.2, TSH 1.85 normal, lithium 0.68 stable  .res Assessment: Plan:    Severe bipolar I disorder, current or most recent episode depressed ([place]) - Plan: DULoxetine 40 MG CPEP  Generalized anxiety disorder - Plan: DULoxetine 40 MG CPEP  Insomnia due to mental condition  Lithium use   This was an urgent emergency appointment which the patient requested because of severe anxiety symptoms and difficulty eating.  We discussed Importance of consistency emphasized bc history or problems causing relapse.     He has had no significant mood swings or depressive bouts since he was last here until recently.Marland Kitchen  However his history is consistent with bipolar  disorder type II.  He stopped his medications AMA in 2021.  He was informed he would likely have a relapse and he did relapsed.  He had a good response on duloxetine 30 mg plus lithium 600 mg daily in the past and had maintained some period of stability on that combination.  He has failed other medications as noted.  It makes sense to return to what worked before rather than experimenting with new medications.  It was explained to him he is likely to need a benzodiazepine in the short-term because he will not get a quick response from lithium and duloxetine most likely.  He would prefer to continue with the alprazolam Increase alprazolam to 0.25 mg 3 times daily as needed Increase duloxetine 40 mg daily  Resume lithium 600 mg nightly   For residual anxiety and depression.  Discussed the risk of mood cycling and mania with an antidepressant like Cymbalta.  However he has had a good response to it in the past and has taken higher doses.  At this time he still has residual anxiety and depression that he would like to see improved. Counseled patient regarding potential benefits, risks, and side effects of lithium to include potential risk of lithium affecting thyroid and renal function.  Discussed need for periodic lab monitoring to determine drug level and to assess for potential adverse effects.  Counseled patient regarding signs and symptoms of lithium toxicity and advised that they notify office immediately or seek urgent medical attention if experiencing these signs and symptoms.  Patient advised to contact office with any questions or concerns. He is on a low dose of lithium is at minimal risk of toxicity.  Continues gabapentin and likes it.  No Lyrica  Recommend follow-up in 8 weeks  Hiram Comber  Clovis Pu, MD, DFAPA   Please see After Visit Summary for patient specific instructions.  Future Appointments  Date Time Provider [place]  11/03/2021  1:00 PM Franchot Gallo, MD [facility] None   11/16/2021 10:00 AM Cottle, Billey Co., MD [facility] None    No orders of the defined types were placed in this encounter.      -------------------------------

## 2021-07-16 NOTE — Telephone Encounter (Signed)
Pharmacy lm requesting the office send a corrected script for the Duloxetine. It was sent in for Duloxetine 40 mg, but the directions stated take 30 mg. Please advise.# [phone].

## 2021-07-17 ENCOUNTER — Other Ambulatory Visit: Payer: Self-pay | Admitting: Psychiatry

## 2021-07-17 ENCOUNTER — Telehealth: Payer: Self-pay | Admitting: Psychiatry

## 2021-07-17 DIAGNOSIS — F411 Generalized anxiety disorder: Secondary | ICD-10-CM

## 2021-07-17 DIAGNOSIS — F314 Bipolar disorder, current episode depressed, severe, without psychotic features: Secondary | ICD-10-CM

## 2021-07-17 MED ORDER — DULOXETINE HCL 20 MG PO CPEP: 40.0000 mg | ORAL_CAPSULE | Freq: Every day | ORAL | 0 refills | Status: DC

## 2021-07-17 NOTE — Telephone Encounter (Signed)
Pt called and said that the duloxetine 40 mg is too expensive for 90 pills thru [facility]. He would like the 20 mg sent in instead for 3 month supply because that will cost him $ 80 instead. Please send to the same pharmacy

## 2021-07-17 NOTE — Telephone Encounter (Signed)
Please review

## 2021-07-17 NOTE — Telephone Encounter (Signed)
sent 

## 2021-07-28 DIAGNOSIS — D485 Neoplasm of uncertain behavior of skin: Secondary | ICD-10-CM | POA: Diagnosis not present

## 2021-07-28 DIAGNOSIS — D0439 Carcinoma in situ of skin of other parts of face: Secondary | ICD-10-CM | POA: Diagnosis not present

## 2021-07-28 DIAGNOSIS — L570 Actinic keratosis: Secondary | ICD-10-CM | POA: Diagnosis not present

## 2021-09-16 ENCOUNTER — Encounter: Payer: Self-pay | Admitting: Psychiatry

## 2021-09-16 ENCOUNTER — Telehealth (INDEPENDENT_AMBULATORY_CARE_PROVIDER_SITE_OTHER): Payer: Medicare HMO | Admitting: Psychiatry

## 2021-09-16 DIAGNOSIS — Z79899 Other long term (current) drug therapy: Secondary | ICD-10-CM | POA: Diagnosis not present

## 2021-09-16 DIAGNOSIS — R69 Illness, unspecified: Secondary | ICD-10-CM | POA: Diagnosis not present

## 2021-09-16 DIAGNOSIS — F314 Bipolar disorder, current episode depressed, severe, without psychotic features: Secondary | ICD-10-CM | POA: Diagnosis not present

## 2021-09-16 DIAGNOSIS — F5105 Insomnia due to other mental disorder: Secondary | ICD-10-CM | POA: Diagnosis not present

## 2021-09-16 DIAGNOSIS — F411 Generalized anxiety disorder: Secondary | ICD-10-CM

## 2021-09-16 NOTE — Progress Notes (Signed)
Joseph Banks [id number] 12-06-1945 76 y.o.   Video Visit via My Chart  I connected with pt by My Chart and verified that I am speaking with the correct person using two identifiers.   I discussed the limitations, risks, security and privacy concerns of performing an evaluation and management service by My Chart  and the availability of in person appointments. I also discussed with the patient that there may be a patient responsible charge related to this service. The patient expressed understanding and agreed to proceed.  I discussed the assessment and treatment plan with the patient. The patient was provided an opportunity to ask questions and all were answered. The patient agreed with the plan and demonstrated an understanding of the instructions.   The patient was advised to call back or seek an in-person evaluation if the symptoms worsen or if the condition fails to improve as anticipated.  I provided 30 minutes of video time during this encounter.  The patient was located at home and the provider was located office.  Session from 1030-1100 AM   Subjective:   Patient ID:  Joseph Banks is a 76 y.o. (DOB [birth year]) male.  Chief Complaint:  Chief Complaint  Patient presents with   Follow-up    Severe bipolar I disorder, current or most recent episode depressed ([place])   Depression   Anxiety    Medication Refill Pertinent negatives include no fatigue or weakness.  Anxiety Patient reports no confusion, decreased concentration, dizziness, nervous/anxious behavior or suicidal ideas.    Depression        Associated symptoms include no decreased concentration, no fatigue, no appetite change and no suicidal ideas.  Past medical history includes anxiety.   Joseph Banks presents to the office today for follow-up of depression and anxiety.    In 2019 August.   Changed lithium slightly to 600 mg and increased the gabapentin to 600 BID.    visit in March 2020 he was doing  well and was more compliant with medication with a good response.  No meds were changed.  seen September2020 without  Med changes.  Main problems is disrupted sleep with some awakening and frequent periods of staying awake for a little while thereafter.  No depression but don't sleep well with nocturia and neuropathy with toes tingling.  PCP prescribed Lyrica for neuropathy.  Tingling is very uncomfortable at times.  Not taking any gabapentin in pm.  A little dizzy and nausea after morning meds when takes most everything.  09/2019 appt was well emotionally but sleep problems and neuropathy.   Plan:Since his neuropathy is not manage he needs to return to the previous dosage.  Start  600 mg twice daily to help neuropathy at night.   Check lihtium level.  03/24/20 appt with the following noted: Never got lithium level and stopped psych meds lithium and duloxetine AMA. "I had so many pills I was taking" for DM and prostate and decided he wanted to stop things.  Stopped April 1.  Denies depression or mood swings.  CO nocturia. Had called and increased trazodone for awhile. Taking gabapentin and pregabalin. Plan: Recommend he discuss with his primary care doctor gabapentin versus Lyrica.  Typically they are not taken together.  Discussed increased risk of side effects related to this. Continue duloxetine.  12/22/2020 appointment with the following noted: Stomach virus with Covid Neg Continues gabapentin and stopped Lyrica.  Also stopped Cymbalta and lithium.  Taking trazodone. Occ irritable. But overall mood is fine.  Mood good.  No depressive episodes.  Patient reports stable mood and denies depressed or irritable moods.  Patient denies any recent difficulty with anxiety.  Patient denies difficulty with sleep initiation but is interrrupted with with  nocturia but normal quantity. Denies appetite disturbance.  Patient reports that energy and motivation have been good.  Patient denies any difficulty with  concentration.  Patient denies any suicidal ideation. Plan: Resume lithium 600 mg nightly rec lithium level as soon as possible. Refuses mood stabilizing psych meds bc no sx currently including lithium.  05/08/21 appt noted: In hosp for almost a month and got prostatitis, meningitis and sepsis and lost 40#, July4- Aug 1.  OK Aug and Sept.  In October started feeling bad with tension, burning skin. Lost appetite.  Anxiety came first.  Low energy, motivation, concentration and tend to want to stay in bed. PCP gave him Xanax 0.25 mg helped minimally.  Drowsy from the anxiety.   He called his primary care doctor and our office within the last couple of days asking for something for anxiety.  His primary care doctor gave him alprazolam 0.25 mg which he has been taking twice daily with a little benefit.  States he has some depression due to the severity of the anxiety and the other symptoms that he is noticing.  He denies manic symptoms. Plan: It was explained to him he is likely to need a benzodiazepine in the short-term because he will not get a quick response from lithium and duloxetine most likely.  He would prefer to continue with the alprazolam Increase alprazolam to 0.25 mg 3 times daily as needed Start duloxetine 30 mg daily Resume lithium 600 mg nightly   07/16/2021 appointment with the following noted Burning skin and anxiety mostly gone.  Depression about 80% better. Not taking much Xanax. Asked about Ativan. Consistent with meds. No SE with meds. PCP switched from Ambien to Lunesta 3 mg HS about a week ago.  May feel a little better during the day but not sleeping quite as well at night.  Had been on Ambien since July.Marland Kitchen Appetite has returned after losing 40#. Slight dizziness and is better. Plan: Increase alprazolam to 0.25 mg 3 times daily as needed Increase duloxetine 40 mg daily  Resume lithium 600 mg nightly   09/16/2021 appointment with the following noted: Better and doing good now  emotionally. Some EMA .  Taking Lunesta 3 mg and melatonin. Awaken 430 and wishes he didn't.  To bed 10.  Total hours 7 hours usually. [place]le[place] in AM. Anxiety entiredly gone and no Xanax.  No burning skin. Depression resolved. No SE except occ jerks.  Not a big deal. In [place] visiting son.  Past Psychiatric Medication Trials: Citalopram no response paroxetine 40 with benefit, mirtazapine, duloxetine 90+ lithium with response, aripiprazole 10, lithium 900, trazodone with benefit, alprazolam, buspirone  Review of Systems:  Review of Systems  Constitutional:  Negative for activity change, appetite change, fatigue and unexpected weight change.  Genitourinary:  Positive for frequency.       Nocturia  Neurological:  Negative for dizziness, tremors and weakness.       Tingling in toes  Psychiatric/Behavioral:  Negative for agitation, behavioral problems, confusion, decreased concentration, dysphoric mood, hallucinations, self-injury, sleep disturbance and suicidal ideas. The patient is not nervous/anxious and is not hyperactive.    Medications: I have reviewed the patient's current medications.  Current Outpatient Medications  Medication Sig Dispense Refill   DULoxetine (CYMBALTA) 20 MG capsule  Take 2 capsules (40 mg total) by mouth daily. 180 capsule 0   Eszopiclone 3 MG TABS Take 3 mg by mouth at bedtime. Take immediately before bedtime     finasteride (PROSCAR) 5 MG tablet Take 1 tablet (5 mg total) by mouth daily. 30 tablet 0   gabapentin (NEURONTIN) 600 MG tablet Take 1 tablet (600 mg total) by mouth 2 (two) times daily. 180 tablet 2   lithium 600 MG capsule Take 1 capsule by mouth at bedtime 90 capsule 0   melatonin 5 MG TABS Take 1 tablet (5 mg total) by mouth at bedtime. 30 tablet 0   metFORMIN (GLUCOPHAGE) 500 MG tablet Take 500 mg by mouth 2 (two) times daily.     tamsulosin (FLOMAX) 0.4 MG CAPS capsule Take 2 capsules (0.8 mg total) by mouth daily after supper. 60 capsule 0    ALPRAZolam (XANAX) 0.25 MG tablet Take 1 tablet (0.25 mg total) by mouth 3 (three) times daily as needed for anxiety. (Patient not taking: Reported on 07/16/2021) 60 tablet 1   zolpidem (AMBIEN) 5 MG tablet Take 1 tablet (5 mg total) by mouth at bedtime. (Patient not taking: Reported on 07/16/2021) 30 tablet 0   No current facility-administered medications for this visit.    Medication Side Effects: None  Allergies: No Known Allergies  Past Medical History:  Diagnosis Date   BPH (benign prostatic hyperplasia)    Diabetes mellitus without complication ([place])     History reviewed. No pertinent family history.  Social History   Socioeconomic History   Marital status: Married    Spouse name: Not on file   Number of children: Not on file   Years of education: Not on file   Highest education level: Not on file  Occupational History   Not on file  Tobacco Use   Smoking status: Former   Smokeless tobacco: Never  Substance and Sexual Activity   Alcohol use: Not on file   Drug use: Not on file   Sexual activity: Not on file  Other Topics Concern   Not on file  Social History Narrative   Not on file   Social Determinants of Health   Financial Resource Strain: Not on file  Food Insecurity: Not on file  Transportation Needs: Not on file  Physical Activity: Not on file  Stress: Not on file  Social Connections: Not on file  Intimate Partner Violence: Not on file    Past Medical History, Surgical history, Social history, and Family history were reviewed and updated as appropriate.   Please see review of systems for further details on the patient's review from today.   Objective:   Physical Exam:  There were no vitals taken for this visit.  Physical Exam Neurological:     Mental Status: He is alert and oriented to person, place, and time.     Cranial Nerves: No dysarthria.  Psychiatric:        Attention and Perception: Attention normal. He is attentive. He does not  perceive auditory hallucinations.        Mood and Affect: Mood is not anxious or depressed. Affect is not labile.        Speech: Speech normal. Speech is not rapid and pressured.        Behavior: Behavior is cooperative.        Thought Content: Thought content normal. Thought content is not paranoid or delusional. Thought content does not include homicidal or suicidal ideation. Thought content does not include  suicidal plan.        Cognition and Memory: Cognition and memory normal.        Judgment: Judgment normal.     Comments: Insight fair.  Historically this is been a problem including now.   Much better not resolved    Lab Review:     Component Value Date/Time   NA 134 (L) 02/05/2021 0539   K 4.3 02/05/2021 0539   CL 103 02/05/2021 0539   CO2 24 02/05/2021 0539   GLUCOSE 126 (H) 02/05/2021 0539   BUN 16 02/05/2021 0539   CREATININE 1.01 02/05/2021 0539   CALCIUM 9.7 02/05/2021 0539   PROT 7.1 02/02/2021 0712   ALBUMIN 3.1 (L) 02/02/2021 0712   AST 30 02/02/2021 0712   ALT 34 02/02/2021 0712   ALKPHOS 46 02/02/2021 0712   BILITOT 0.7 02/02/2021 0712   GFRNONAA >60 02/05/2021 0539       Component Value Date/Time   WBC 5.0 02/05/2021 0539   RBC 3.50 (L) 02/05/2021 0539   HGB 10.9 (L) 02/05/2021 0539   HCT 32.5 (L) 02/05/2021 0539   PLT 470 (H) 02/05/2021 0539   MCV 92.9 02/05/2021 0539   MCH 31.1 02/05/2021 0539   MCHC 33.5 02/05/2021 0539   RDW 15.3 02/05/2021 0539   LYMPHSABS 2.5 02/05/2021 0539   MONOABS 0.6 02/05/2021 0539   EOSABS 0.2 02/05/2021 0539   BASOSABS 0.0 02/05/2021 0539    No results found for: POCLITH, LITHIUM   No results found for: PHENYTOIN, PHENOBARB, VALPROATE, CBMZ   Labs from August 21, 2018 included BMP which was normal including calcium 9.4 and creatinine 1.2, TSH 1.85 normal, lithium 0.68 stable  .res Assessment: Plan:    Severe bipolar I disorder, current or most recent episode depressed ([place]) - Plan: Lithium level, Basic  metabolic panel, TSH  Generalized anxiety disorder  Insomnia due to mental condition  Lithium use - Plan: Lithium level, Basic metabolic panel, TSH   This was FU appointment which the patient requested because of severe anxiety symptoms and difficulty eating.  We discussed Importance of consistency emphasized bc history of compliance problems causing relapse.     He had been stable when compliant with the medications.  his history is consistent with bipolar disorder type II.  He stopped his medications AMA in 2021.  He was informed he would likely have a relapse and he did relapse.  He had a good response on duloxetine 30 mg plus lithium 600 mg daily in the past and had maintained some period of stability on that combination.  He has failed other medications as noted.  It makes sense continue what worked before rather than experimenting with new medications.  Be consistent bc history of relapse Ialprazolam to 0.25 mg 3 times daily as needed Continue duloxetine 40 mg daily  lithium 600 mg nightly   For residual anxiety and depression.  Discussed the risk of mood cycling and mania with an antidepressant like Cymbalta.  However he has had a good response to it in the past and has taken higher doses.  At this time he still has residual anxiety and depression that he would like to see improved. Counseled patient regarding potential benefits, risks, and side effects of lithium to include potential risk of lithium affecting thyroid and renal function.  Disc jerks on lithium. Discussed need for periodic lab monitoring to determine drug level and to assess for potential adverse effects.  Counseled patient regarding signs and symptoms of lithium toxicity and  advised that they notify office immediately or seek urgent medical attention if experiencing these signs and symptoms.  Patient advised to contact office with any questions or concerns. He is on a low dose of lithium is at minimal risk of toxicity. He  takes lithium in AM and disc trough level Check labs [place], [place] [phone]  Continues gabapentin and likes it.  No Lyrica  Recommend follow-up in 3-4 mos  Lynder Parents, MD, DFAPA   Please see After Visit Summary for patient specific instructions.  Future Appointments  Date Time Provider [place]  11/03/2021  1:00 PM Franchot Gallo, MD [facility] None  11/16/2021 10:00 AM Cottle, Billey Co., MD [facility] None    Orders Placed This Encounter  Procedures   Lithium level   Basic metabolic panel   TSH       -------------------------------

## 2021-09-21 ENCOUNTER — Ambulatory Visit: Payer: Medicare HMO | Admitting: Psychiatry

## 2021-09-29 DIAGNOSIS — R69 Illness, unspecified: Secondary | ICD-10-CM | POA: Diagnosis not present

## 2021-09-29 DIAGNOSIS — Z79899 Other long term (current) drug therapy: Secondary | ICD-10-CM | POA: Diagnosis not present

## 2021-09-29 DIAGNOSIS — F314 Bipolar disorder, current episode depressed, severe, without psychotic features: Secondary | ICD-10-CM | POA: Diagnosis not present

## 2021-09-30 ENCOUNTER — Encounter: Payer: Self-pay | Admitting: Psychiatry

## 2021-10-01 LAB — BASIC METABOLIC PANEL
BUN/Creatinine Ratio: 15 (ref 10–24)
BUN: 15 mg/dL (ref 8–27)
CO2: 25 mmol/L (ref 20–29)
Calcium: 9.6 mg/dL (ref 8.6–10.2)
Chloride: 103 mmol/L (ref 96–106)
Creatinine, Ser: 0.99 mg/dL (ref 0.76–1.27)
Glucose: 128 mg/dL — ABNORMAL HIGH (ref 70–99)
Potassium: 4.9 mmol/L (ref 3.5–5.2)
Sodium: 143 mmol/L (ref 134–144)
eGFR: 79 mL/min/{1.73_m2} (ref >59)

## 2021-10-01 LAB — LITHIUM LEVEL: Lithium Lvl: 0.6 mmol/L (ref 0.5–1.2)

## 2021-10-01 LAB — TSH: TSH: 5.33 u[IU]/mL — ABNORMAL HIGH (ref 0.450–4.500)

## 2021-10-05 NOTE — Progress Notes (Signed)
Lithium 0.6 on 600 mg daily.  Borderline high TSH.  No sx.  No changes

## 2021-10-14 ENCOUNTER — Telehealth: Payer: Self-pay | Admitting: Psychiatry

## 2021-10-14 DIAGNOSIS — E0841 Diabetes mellitus due to underlying condition with diabetic mononeuropathy: Secondary | ICD-10-CM

## 2021-10-14 DIAGNOSIS — F314 Bipolar disorder, current episode depressed, severe, without psychotic features: Secondary | ICD-10-CM

## 2021-10-14 MED ORDER — LITHIUM CARBONATE 600 MG PO CAPS: 600.0000 mg | ORAL_CAPSULE | Freq: Every day | ORAL | 0 refills | Status: DC

## 2021-10-14 MED ORDER — GABAPENTIN 600 MG PO TABS: 600.0000 mg | ORAL_TABLET | Freq: Two times a day (BID) | ORAL | 0 refills | Status: DC

## 2021-10-14 NOTE — Telephone Encounter (Signed)
Sent!

## 2021-10-14 NOTE — Telephone Encounter (Signed)
Joseph Banks called. He is in [place], [place] and is trying to get his meds filled there in [facility]. He would like his full RX sent in to [facility] on ?[address], [place] [address]. Please send in Lithium and Gabapentin RX ?

## 2021-10-29 DIAGNOSIS — C44329 Squamous cell carcinoma of skin of other parts of face: Secondary | ICD-10-CM | POA: Diagnosis not present

## 2021-11-02 NOTE — Progress Notes (Signed)
History of Present Illness:  ? ?BPH-- ? ?8.27.2019: On avodart for about 1.5 years. Had TURP 2013.  ?  ?  ?2.11.2020: He is now off of Avodart. Most recent PSA is 6 which is appropriate for him being off of that. He still has nocturia x3 but has not managed his behavior i.e. decreasing fluids at night, limiting sodium or wearing compression socks  ?  ?10.13.2020: He returns today for follow-up now reporting significantly worsened urinary sx's (IPSS 6 --> 25).  ?  ?6.7.2022: He still takes tamsulosin-sometimes 1 a day, sometimes 2.  He does still have bothersome urinary symptoms.  IPSS 21, quality-of-life score 4. ? ?12.6.2022: He was treated for acute prostatitis in the summer 2022.  Another admission following that was for gross hematuria.  Patient ended up having encephalitis.  He did have issues with voiding, was increased to tamsulosin 2 capsules daily as well as finasteride. ? ?He has had no real urinary issues since his last visit.  No gross hematuria, no dysuria.  On 2 tamsulosin a day, also taking finasteride.  IPSS 4, quality-of-life score 2 ? ?4.25.2023: Still on finasteride, takes either 1 or 2 tamsulosin's a day.  Perhaps some dizziness.  IPSS 6, quality-of-life score 2-3. ?  ?Elevated PSA:  ?  ?He has had 3 prior TRUS/biopsies. The first was in about 2005. Done by 3 separate urologists--in [place], [place] and [place]. Prior to biopsies PSA range was 9 to 11.  ?  ?2.11.2020:  PSA 6.0. He is now off of Avodart.  ?10.13.2020: PSA 6.82.   ?2.9.2021: 9.4 ?  ? ? ? ? ?Past Medical History:  ?Diagnosis Date  ? BPH (benign prostatic hyperplasia)   ? Diabetes mellitus without complication ([place])   ? ? ?Past Surgical History:  ?Procedure Laterality Date  ? IR FLUORO GUIDED NEEDLE PLC ASPIRATION/INJECTION LOC  01/16/2021  ? TRANSURETHRAL RESECTION OF PROSTATE    ? ? ?Home Medications:  ?Allergies as of 11/03/2021   ?No Known Allergies ?  ? ?  ?Medication List  ?  ? ?  ? Accurate as of November 02, 2021  8:45 PM.  If you have any questions, ask your nurse or doctor.  ?  ?  ? ?  ? ?ALPRAZolam 0.25 MG tablet ?Commonly known as: Duanne Moron ?Take 1 tablet (0.25 mg total) by mouth 3 (three) times daily as needed for anxiety. ?  ?DULoxetine 20 MG capsule ?Commonly known as: CYMBALTA ?Take 2 capsules (40 mg total) by mouth daily. ?  ?Eszopiclone 3 MG Tabs ?Take 3 mg by mouth at bedtime. Take immediately before bedtime ?  ?finasteride 5 MG tablet ?Commonly known as: PROSCAR ?Take 1 tablet (5 mg total) by mouth daily. ?  ?gabapentin 600 MG tablet ?Commonly known as: NEURONTIN ?Take 1 tablet (600 mg total) by mouth 2 (two) times daily. ?  ?lithium 600 MG capsule ?Take 1 capsule (600 mg total) by mouth at bedtime. ?  ?melatonin 5 MG Tabs ?Take 1 tablet (5 mg total) by mouth at bedtime. ?  ?metFORMIN 500 MG tablet ?Commonly known as: GLUCOPHAGE ?Take 500 mg by mouth 2 (two) times daily. ?  ?tamsulosin 0.4 MG Caps capsule ?Commonly known as: FLOMAX ?Take 2 capsules (0.8 mg total) by mouth daily after supper. ?  ?zolpidem 5 MG tablet ?Commonly known as: AMBIEN ?Take 1 tablet (5 mg total) by mouth at bedtime. ?  ? ?  ? ? ?Allergies: No Known Allergies ? ?No family history on file. ? ?  Social History:  reports that he has quit smoking. He has never used smokeless tobacco. No history on file for alcohol use and drug use. ? ?ROS: ?A complete review of systems was performed.  All systems are negative except for pertinent findings as noted. ? ?Physical Exam:  ?Vital signs in last 24 hours: ?There were no vitals taken for this visit. ?Constitutional:  Alert and oriented, No acute distress ?Cardiovascular: Regular rate  ?Respiratory: Normal respiratory effort ?Neurologic: Grossly intact, no focal deficits ?Psychiatric: Normal mood and affect ? ?I have reviewed prior pt notes ? ?I have reviewed urinalysis results ? ?I have independently reviewed prior imaging--prior bladder scan, as well as the scan today measuring 156 mL ? ?I have reviewed prior PSA  results ? ? ? ?Impression/Assessment:  ?1.  BPH, symptoms minimal with dual medical therapy.  I think he can back down to 1 tamsulosin a day ? ?2.  History of elevated PSA, not checked in a couple of years ? ?3.  Remote history of prostatitis, asymptomatic ? ?Plan:  ?1.  I will check his PSA today ? ?2.  Try to back down to 1 tamsulosin a day in addition to his finasteride ? ?3.  I will check him back in about 6 months ? ?

## 2021-11-03 ENCOUNTER — Ambulatory Visit: Payer: Medicare HMO | Admitting: Urology

## 2021-11-03 ENCOUNTER — Encounter: Payer: Self-pay | Admitting: Urology

## 2021-11-03 VITALS — BP 149/83 | HR 66

## 2021-11-03 DIAGNOSIS — R300 Dysuria: Secondary | ICD-10-CM

## 2021-11-03 DIAGNOSIS — N401 Enlarged prostate with lower urinary tract symptoms: Secondary | ICD-10-CM | POA: Diagnosis not present

## 2021-11-03 DIAGNOSIS — R972 Elevated prostate specific antigen [PSA]: Secondary | ICD-10-CM | POA: Diagnosis not present

## 2021-11-03 DIAGNOSIS — N400 Enlarged prostate without lower urinary tract symptoms: Secondary | ICD-10-CM | POA: Diagnosis not present

## 2021-11-03 DIAGNOSIS — N5201 Erectile dysfunction due to arterial insufficiency: Secondary | ICD-10-CM | POA: Diagnosis not present

## 2021-11-03 DIAGNOSIS — R351 Nocturia: Secondary | ICD-10-CM | POA: Diagnosis not present

## 2021-11-03 DIAGNOSIS — R339 Retention of urine, unspecified: Secondary | ICD-10-CM | POA: Diagnosis not present

## 2021-11-04 LAB — URINALYSIS, ROUTINE W REFLEX MICROSCOPIC
Bilirubin, UA: NEGATIVE
Glucose, UA: NEGATIVE
Ketones, UA: NEGATIVE
Nitrite, UA: NEGATIVE
Protein,UA: NEGATIVE
RBC, UA: NEGATIVE
Specific Gravity, UA: 1.02 (ref 1.005–1.030)
Urobilinogen, Ur: 0.2 mg/dL (ref 0.2–1.0)
pH, UA: 6.5 (ref 5.0–7.5)

## 2021-11-04 LAB — MICROSCOPIC EXAMINATION
Epithelial Cells (non renal): NONE SEEN /hpf (ref 0–10)
RBC, Urine: NONE SEEN /hpf (ref 0–2)
Renal Epithel, UA: NONE SEEN /hpf

## 2021-11-04 LAB — PSA: Prostate Specific Ag, Serum: 4.9 ng/mL — ABNORMAL HIGH (ref 0.0–4.0)

## 2021-11-05 ENCOUNTER — Other Ambulatory Visit: Payer: Self-pay | Admitting: Psychiatry

## 2021-11-08 DIAGNOSIS — I10 Essential (primary) hypertension: Secondary | ICD-10-CM | POA: Diagnosis not present

## 2021-11-08 DIAGNOSIS — E7800 Pure hypercholesterolemia, unspecified: Secondary | ICD-10-CM | POA: Diagnosis not present

## 2021-11-10 NOTE — Progress Notes (Signed)
Letter sent.

## 2021-11-16 ENCOUNTER — Encounter: Payer: Self-pay | Admitting: Psychiatry

## 2021-11-16 ENCOUNTER — Telehealth (INDEPENDENT_AMBULATORY_CARE_PROVIDER_SITE_OTHER): Payer: Medicare HMO | Admitting: Psychiatry

## 2021-11-16 DIAGNOSIS — E0841 Diabetes mellitus due to underlying condition with diabetic mononeuropathy: Secondary | ICD-10-CM

## 2021-11-16 DIAGNOSIS — F5105 Insomnia due to other mental disorder: Secondary | ICD-10-CM | POA: Diagnosis not present

## 2021-11-16 DIAGNOSIS — Z79899 Other long term (current) drug therapy: Secondary | ICD-10-CM | POA: Diagnosis not present

## 2021-11-16 DIAGNOSIS — F314 Bipolar disorder, current episode depressed, severe, without psychotic features: Secondary | ICD-10-CM

## 2021-11-16 DIAGNOSIS — R69 Illness, unspecified: Secondary | ICD-10-CM | POA: Diagnosis not present

## 2021-11-16 DIAGNOSIS — F411 Generalized anxiety disorder: Secondary | ICD-10-CM | POA: Diagnosis not present

## 2021-11-16 MED ORDER — ESZOPICLONE 3 MG PO TABS: 3.0000 mg | ORAL_TABLET | Freq: Every day | ORAL | 5 refills | Status: DC

## 2021-11-16 MED ORDER — GABAPENTIN 600 MG PO TABS: 600.0000 mg | ORAL_TABLET | Freq: Two times a day (BID) | ORAL | 1 refills | Status: DC

## 2021-11-16 MED ORDER — DULOXETINE HCL 20 MG PO CPEP: 40.0000 mg | ORAL_CAPSULE | Freq: Every day | ORAL | 1 refills | Status: DC

## 2021-11-16 MED ORDER — LITHIUM CARBONATE 600 MG PO CAPS: 600.0000 mg | ORAL_CAPSULE | Freq: Every day | ORAL | 1 refills | Status: DC

## 2021-11-16 NOTE — Progress Notes (Signed)
Joseph Banks ?[id number] ?March 11, 1946 ?76 y.o.  ? ?Video Visit via My Chart ? ?I connected with pt by My Chart and verified that I am speaking with the correct person using two identifiers. ?  ?I discussed the limitations, risks, security and privacy concerns of performing an evaluation and management service by My Chart  and the availability of in person appointments. I also discussed with the patient that there may be a patient responsible charge related to this service. The patient expressed understanding and agreed to proceed. ? ?I discussed the assessment and treatment plan with the patient. The patient was provided an opportunity to ask questions and all were answered. The patient agreed with the plan and demonstrated an understanding of the instructions. ?  ?The patient was advised to call back or seek an in-person evaluation if the symptoms worsen or if the condition fails to improve as anticipated. ? ?I provided 30 minutes of video time during this encounter.  The patient was located at home and the provider was located office. ? ?Session from [id number] ? ?Subjective:  ? ?Patient ID:  Joseph Banks is a 76 y.o. (DOB Jun 13, 1946) male. ? ?Chief Complaint:  ?Chief Complaint  ?Patient presents with  ? Follow-up  ? Depression  ? Anxiety  ? Sleeping Problem  ? ? ?Medication Refill ?Pertinent negatives include no fatigue or weakness.  ?Anxiety ?Patient reports no confusion, decreased concentration, dizziness, nervous/anxious behavior or suicidal ideas.  ? ? ?Depression ?       Associated symptoms include no decreased concentration, no fatigue and no suicidal ideas.  Past medical history includes anxiety.   ?[name] presents to the office today for follow-up of depression and anxiety.   ? ?In 2019 August.   Changed lithium slightly to 600 mg and increased the gabapentin to 600 BID.   ? ?visit in March 2020 he was doing well and was more compliant with medication with a good response.  No meds were  changed. ? ?seen September2020 without  Med changes.  Main problems is disrupted sleep with some awakening and frequent periods of staying awake for a little while thereafter.  No depression but don't sleep well with nocturia and neuropathy with toes tingling.  PCP prescribed Lyrica for neuropathy.  Tingling is very uncomfortable at times.  Not taking any gabapentin in pm. ? ?A little dizzy and nausea after morning meds when takes most everything. ? ?09/2019 appt was well emotionally but sleep problems and neuropathy.   ?Plan:Since his neuropathy is not manage he needs to return to the previous dosage.  Start  600 mg twice daily to help neuropathy at night.   ?Check lihtium level. ? ?03/24/20 appt with the following noted: ?Never got lithium level and stopped psych meds lithium and duloxetine AMA. ?"I had so many pills I was taking" for DM and prostate and decided he wanted to stop things.  Stopped April 1.  Denies depression or mood swings.  CO nocturia. ?Had called and increased trazodone for awhile. ?Taking gabapentin and pregabalin. ?Plan: Recommend he discuss with his primary care doctor gabapentin versus Lyrica.  Typically they are not taken together.  Discussed increased risk of side effects related to this. ?Continue duloxetine. ? ?12/22/2020 appointment with the following noted: ?Stomach virus with Covid Neg ?Continues gabapentin and stopped Lyrica.  Also stopped Cymbalta and lithium.  Taking trazodone. ?Occ irritable. But overall mood is fine. ?Mood good.  No depressive episodes.  Patient reports stable mood and denies depressed or  irritable moods.  Patient denies any recent difficulty with anxiety.  Patient denies difficulty with sleep initiation but is interrrupted with with  nocturia but normal quantity. Denies appetite disturbance.  Patient reports that energy and motivation have been good.  Patient denies any difficulty with concentration.  Patient denies any suicidal ideation. ?Plan: Resume lithium 600  mg nightly rec lithium level as soon as possible. ?Refuses mood stabilizing psych meds bc no sx currently including lithium. ? ?05/08/21 appt noted: ?In hosp for almost a month and got prostatitis, meningitis and sepsis and lost 40#, July4- Aug 1.  OK Aug and Sept.  In October started feeling bad with tension, burning skin. Lost appetite.  Anxiety came first.  Low energy, motivation, concentration and tend to want to stay in bed. ?PCP gave him Xanax 0.25 mg helped minimally.  Drowsy from the anxiety.   ?He called his primary care doctor and our office within the last couple of days asking for something for anxiety.  His primary care doctor gave him alprazolam 0.25 mg which he has been taking twice daily with a little benefit.  States he has some depression due to the severity of the anxiety and the other symptoms that he is noticing.  He denies manic symptoms. ?Plan: It was explained to him he is likely to need a benzodiazepine in the short-term because he will not get a quick response from lithium and duloxetine most likely.  He would prefer to continue with the alprazolam ?Increase alprazolam to 0.25 mg 3 times daily as needed ?Start duloxetine 30 mg daily ?Resume lithium 600 mg nightly  ? ?07/16/2021 appointment with the following noted ?Burning skin and anxiety mostly gone.  Depression about 80% better. ?Not taking much Xanax. Asked about Ativan. ?Consistent with meds. No SE with meds. ?PCP switched from Ambien to Lunesta 3 mg HS about a week ago.  May feel a little better during the day but not sleeping quite as well at night.  Had been on Ambien since July.Marland Kitchen ?Appetite has returned after losing 40#. ?Slight dizziness and is better. ?Plan: Increase alprazolam to 0.25 mg 3 times daily as needed ?Increase duloxetine 40 mg daily  ?Resume lithium 600 mg nightly  ? ?09/16/2021 appointment with the following noted: ?Better and doing good now emotionally. ?Some EMA .  Taking Lunesta 3 mg and melatonin. Awaken 430 and  wishes he didn't.  To bed 10.  Total hours 7 hours usually. ?Pickleball in AM. ?Anxiety entiredly gone and no Xanax.  No burning skin. Depression resolved. ?No SE except occ jerks.  Not a big deal. ?In [place] visiting son. ?Plan: Be consistent bc history of relapse ?Ialprazolam to 0.25 mg 3 times daily as needed ?Continue duloxetine 40 mg daily  ?lithium 600 mg nightly  ? ?11/16/21 appt noted: ?No major problems.  Has to take a nap afternoon but gets up at 5 AM.   ?Still playing pickleball on Wed AM.   ?depression and anxiety are under control.  No mood swings. ?Continue benefit with meds. ?No SE .  Sometimes lightheaded ness is a better than it was.  Hx meningitis. ? ? ?Past Psychiatric Medication Trials: Citalopram no response paroxetine 40 with benefit, mirtazapine, duloxetine 90+ lithium with response, aripiprazole 10, lithium 900, trazodone with benefit, alprazolam, buspirone ? ?Review of Systems:  ?Review of Systems  ?Constitutional:  Negative for fatigue and unexpected weight change.  ?Genitourinary:  Positive for frequency.  ?     Nocturia  ?Neurological:  Negative for  dizziness, tremors and weakness.  ?     Tingling in toes  ?Psychiatric/Behavioral:  Negative for agitation, behavioral problems, confusion, decreased concentration, dysphoric mood, hallucinations, self-injury, sleep disturbance and suicidal ideas. The patient is not nervous/anxious and is not hyperactive.   ? ?Medications: I have reviewed the patient's current medications. ? ?Current Outpatient Medications  ?Medication Sig Dispense Refill  ? finasteride (PROSCAR) 5 MG tablet Take 1 tablet (5 mg total) by mouth daily. 30 tablet 0  ? melatonin 5 MG TABS Take 1 tablet (5 mg total) by mouth at bedtime. 30 tablet 0  ? metFORMIN (GLUCOPHAGE) 500 MG tablet Take 500 mg by mouth 2 (two) times daily.    ? rosuvastatin (CRESTOR) 5 MG tablet Take by mouth.    ? tamsulosin (FLOMAX) 0.4 MG CAPS capsule Take 2 capsules (0.8 mg total) by mouth daily  after supper. 60 capsule 0  ? DULoxetine (CYMBALTA) 20 MG capsule Take 2 capsules (40 mg total) by mouth daily. 180 capsule 1  ? Eszopiclone 3 MG TABS Take 1 tablet (3 mg total) by mouth at bedtime. Take immediatel

## 2021-11-26 DIAGNOSIS — D485 Neoplasm of uncertain behavior of skin: Secondary | ICD-10-CM | POA: Diagnosis not present

## 2021-11-26 DIAGNOSIS — L570 Actinic keratosis: Secondary | ICD-10-CM | POA: Diagnosis not present

## 2021-12-03 DIAGNOSIS — E1165 Type 2 diabetes mellitus with hyperglycemia: Secondary | ICD-10-CM | POA: Diagnosis not present

## 2021-12-03 DIAGNOSIS — I700 Atherosclerosis of aorta: Secondary | ICD-10-CM | POA: Diagnosis not present

## 2021-12-03 DIAGNOSIS — Z299 Encounter for prophylactic measures, unspecified: Secondary | ICD-10-CM | POA: Diagnosis not present

## 2021-12-03 DIAGNOSIS — I10 Essential (primary) hypertension: Secondary | ICD-10-CM | POA: Diagnosis not present

## 2021-12-03 DIAGNOSIS — R69 Illness, unspecified: Secondary | ICD-10-CM | POA: Diagnosis not present

## 2021-12-08 IMAGING — CT CT HEAD W/O CM
4 of 8 series · 17 of 47 positions shown, 18 images · non-contrast
Comparison: None.

CLINICAL DATA: Fever.

EXAM:
CT HEAD WITHOUT CONTRAST
TECHNIQUE: Contiguous axial images were obtained from the base of the skull
through the vertex without intravenous contrast.

[Series 2: head wo · axial · 0.47mm/px · z∈[+1767,+1852]mm · 3 of 35 slices shown, 4 images]
[im 9/35  brain]
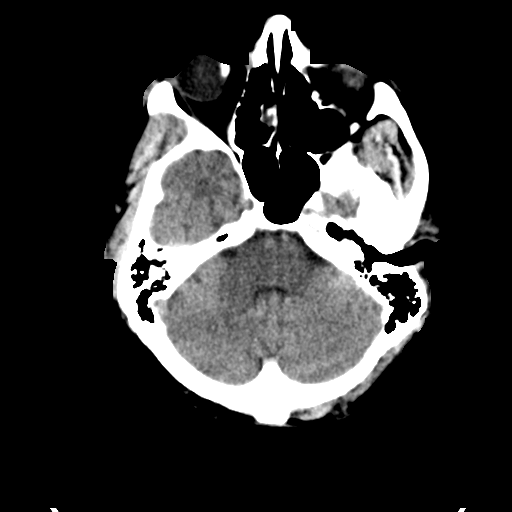
[im 9/35  bone]
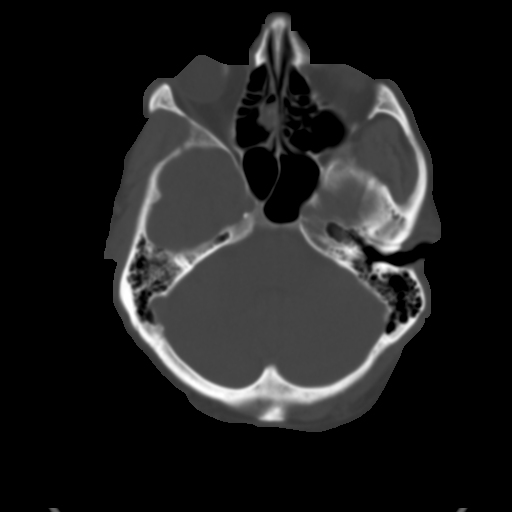
[im 18/35  brain]
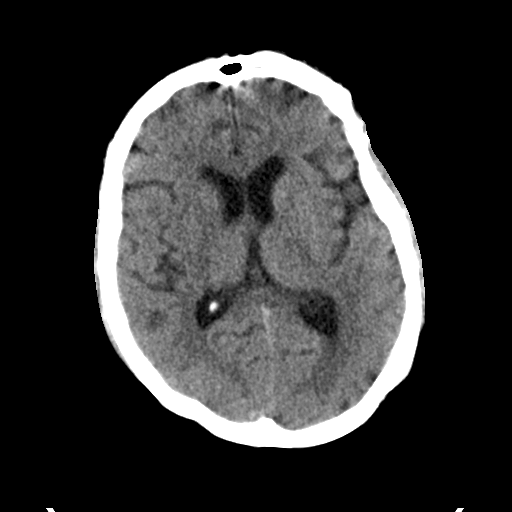
[im 26/35  brain]
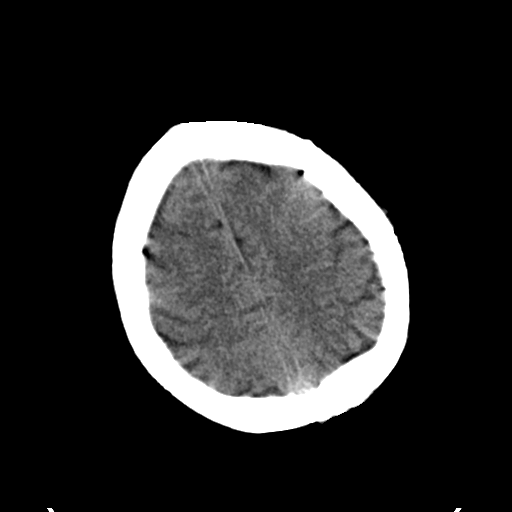

[Series 5: head bone · axial · 0.47mm/px · z∈[+1739,+1883]mm · 8 of 86 slices shown]
[im 7/86  bone]
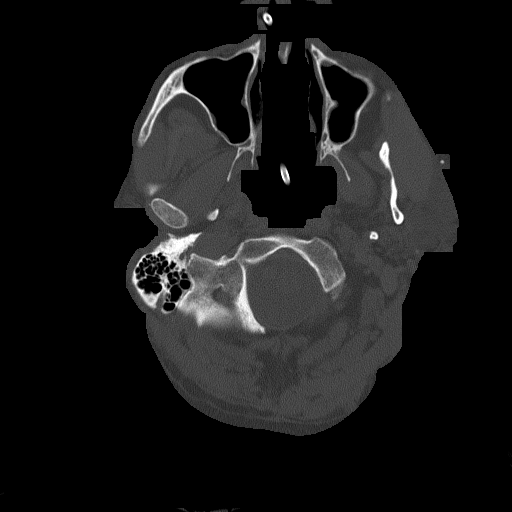
[im 19/86  bone]
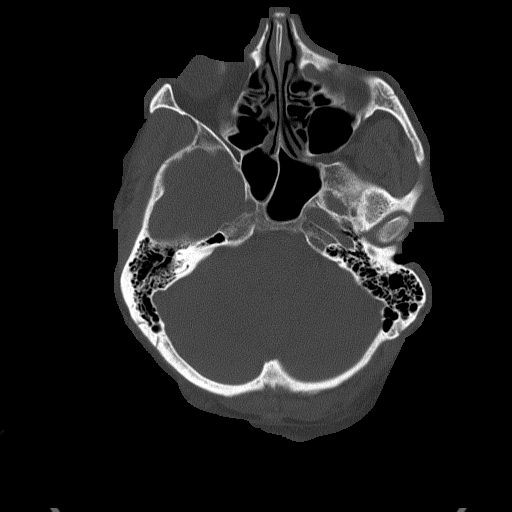
[im 31/86  bone]
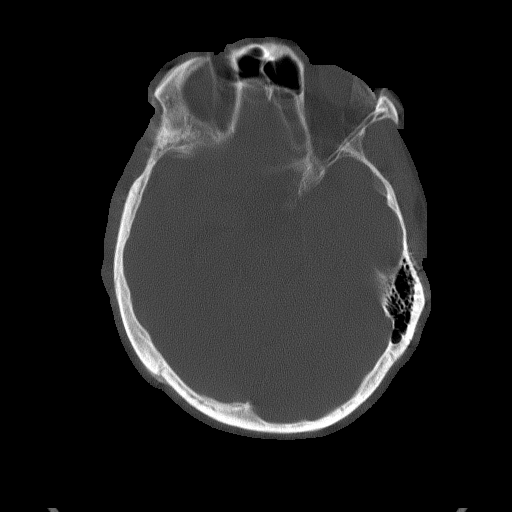
[im 37/86  bone]
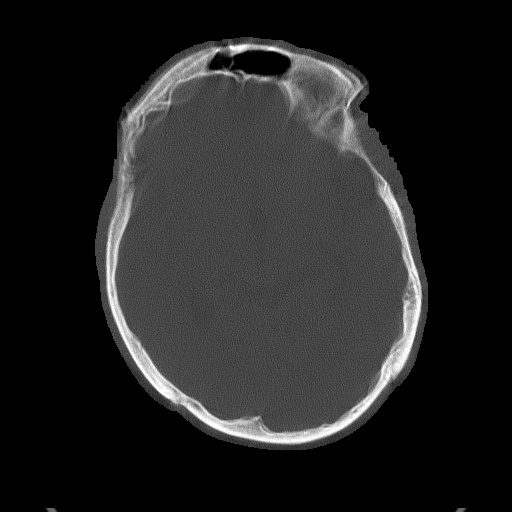
[im 49/86  bone]
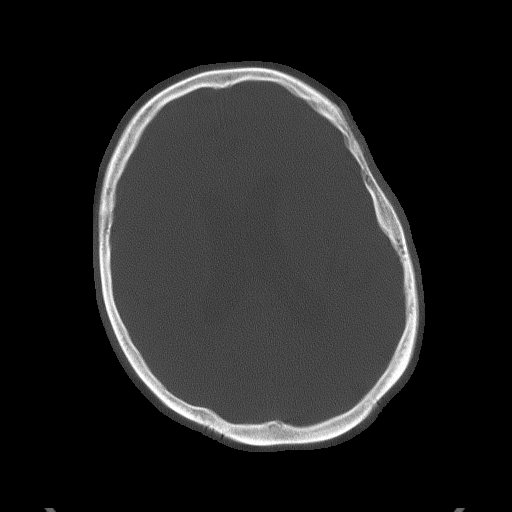
[im 55/86  bone]
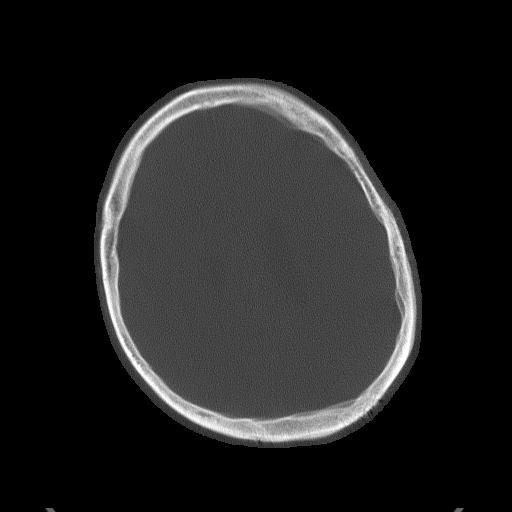
[im 67/86  bone]
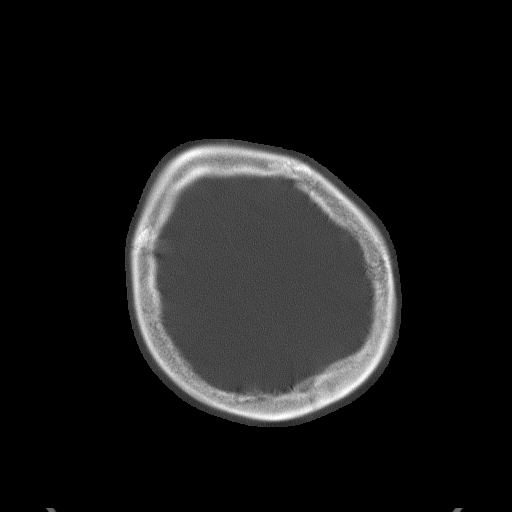
[im 79/86  bone]
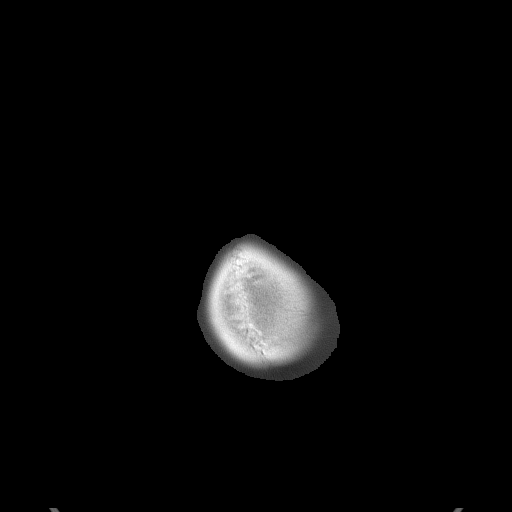

[Series 6: coronal soft tissue · coronal · 0.37mm/px · 3 of 75 slices shown]
[im 19/75  brain]
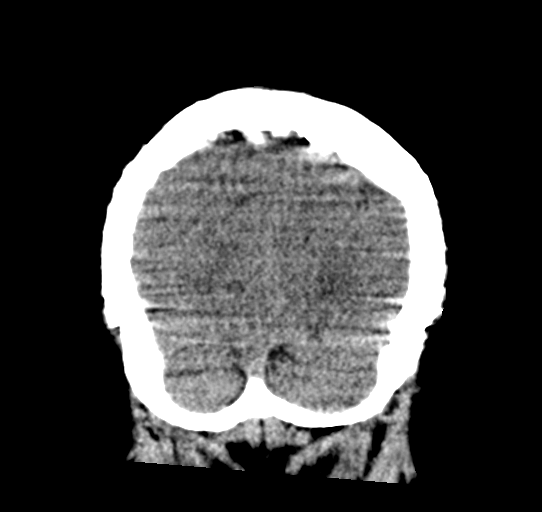
[im 38/75  brain]
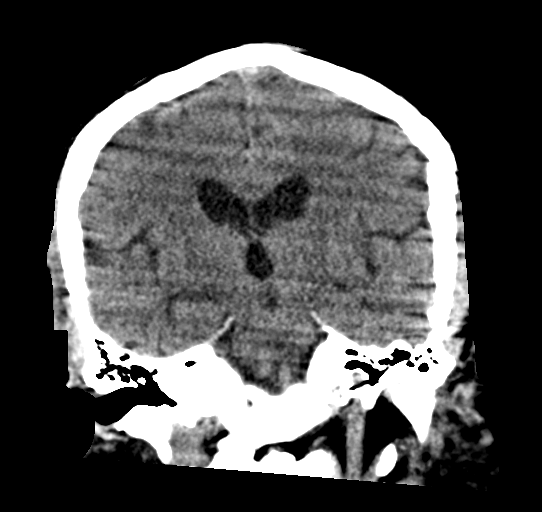
[im 56/75  brain]
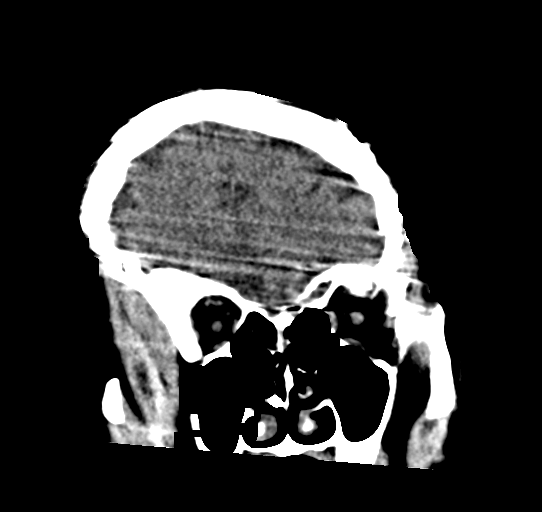

[Series 7: sagittal soft tissue · sagittal · 0.39mm/px · 3 of 67 slices shown]
[im 17/67  brain]
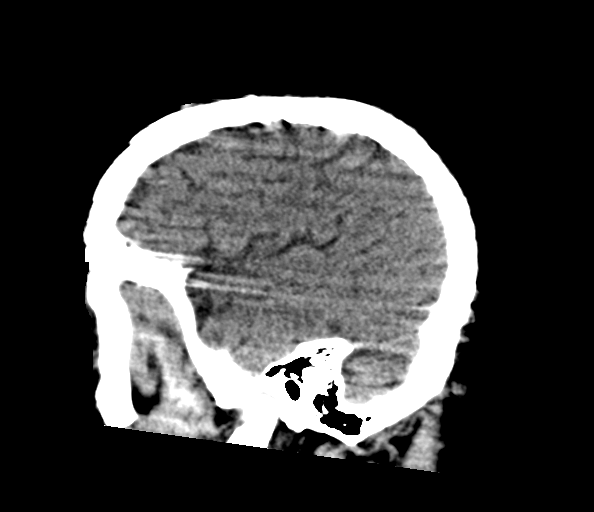
[im 34/67  brain]
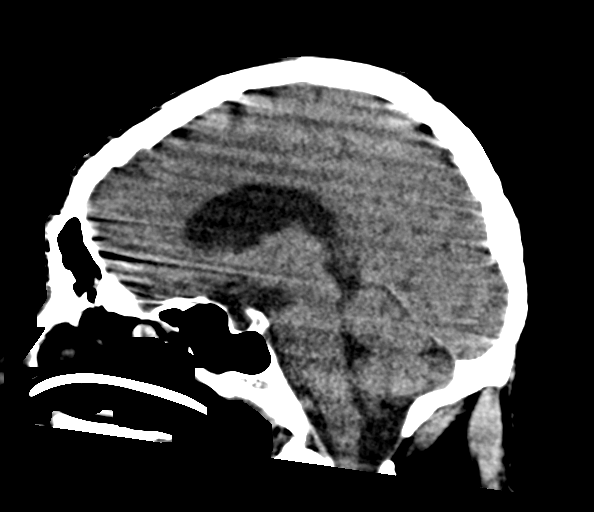
[im 50/67  brain]
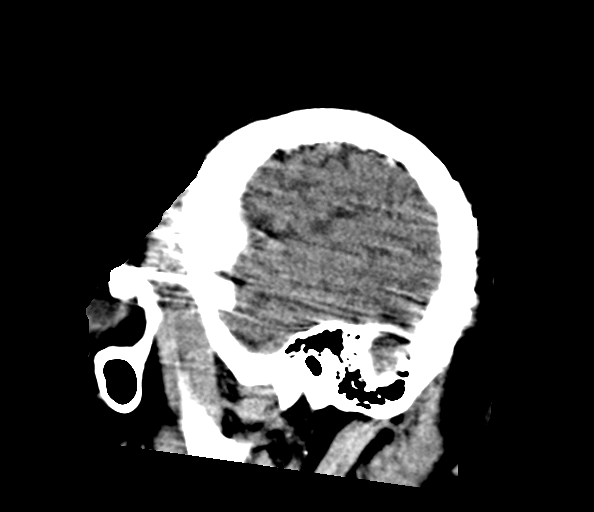

[17 of 47 positions shown; findings below may reference images not displayed]

FINDINGS: Brain: Patient had difficulty tolerating the exam there is moderate
motion artifact allowing for motion, no intracranial hemorrhage. No
mass effect or midline shift. No hydrocephalus. The basilar cisterns
are patent. No evidence of territorial infarct or acute ischemia. No
extra-axial or intracranial fluid collection.

Vascular: No hyperdense vessel. Mild carotid skull base
atherosclerosis.

Skull: No fracture or focal lesion.

Sinuses/Orbits: Postsurgical change of the right globe. No acute
findings.

Other: None.
IMPRESSION: Motion limited exam. No acute intracranial abnormality.

## 2021-12-23 DIAGNOSIS — I10 Essential (primary) hypertension: Secondary | ICD-10-CM | POA: Diagnosis not present

## 2021-12-23 DIAGNOSIS — Z789 Other specified health status: Secondary | ICD-10-CM | POA: Diagnosis not present

## 2021-12-23 DIAGNOSIS — Z0000 Encounter for general adult medical examination without abnormal findings: Secondary | ICD-10-CM | POA: Diagnosis not present

## 2021-12-23 DIAGNOSIS — Z125 Encounter for screening for malignant neoplasm of prostate: Secondary | ICD-10-CM | POA: Diagnosis not present

## 2021-12-23 DIAGNOSIS — Z79899 Other long term (current) drug therapy: Secondary | ICD-10-CM | POA: Diagnosis not present

## 2021-12-23 DIAGNOSIS — E7800 Pure hypercholesterolemia, unspecified: Secondary | ICD-10-CM | POA: Diagnosis not present

## 2021-12-23 DIAGNOSIS — Z1331 Encounter for screening for depression: Secondary | ICD-10-CM | POA: Diagnosis not present

## 2021-12-23 DIAGNOSIS — Z7189 Other specified counseling: Secondary | ICD-10-CM | POA: Diagnosis not present

## 2021-12-23 DIAGNOSIS — R5383 Other fatigue: Secondary | ICD-10-CM | POA: Diagnosis not present

## 2021-12-23 DIAGNOSIS — Z6824 Body mass index (BMI) 24.0-24.9, adult: Secondary | ICD-10-CM | POA: Diagnosis not present

## 2021-12-23 DIAGNOSIS — Z1339 Encounter for screening examination for other mental health and behavioral disorders: Secondary | ICD-10-CM | POA: Diagnosis not present

## 2021-12-23 DIAGNOSIS — Z299 Encounter for prophylactic measures, unspecified: Secondary | ICD-10-CM | POA: Diagnosis not present

## 2021-12-25 DIAGNOSIS — M25511 Pain in right shoulder: Secondary | ICD-10-CM | POA: Diagnosis not present

## 2021-12-28 ENCOUNTER — Other Ambulatory Visit: Payer: Self-pay

## 2021-12-30 DIAGNOSIS — Z1211 Encounter for screening for malignant neoplasm of colon: Secondary | ICD-10-CM | POA: Diagnosis not present

## 2021-12-30 DIAGNOSIS — Z1212 Encounter for screening for malignant neoplasm of rectum: Secondary | ICD-10-CM | POA: Diagnosis not present

## 2022-01-06 DIAGNOSIS — M25511 Pain in right shoulder: Secondary | ICD-10-CM | POA: Diagnosis not present

## 2022-01-11 ENCOUNTER — Other Ambulatory Visit: Payer: Self-pay | Admitting: Urology

## 2022-01-18 DIAGNOSIS — M25511 Pain in right shoulder: Secondary | ICD-10-CM | POA: Diagnosis not present

## 2022-02-02 DIAGNOSIS — M25811 Other specified joint disorders, right shoulder: Secondary | ICD-10-CM | POA: Diagnosis not present

## 2022-02-04 DIAGNOSIS — M25811 Other specified joint disorders, right shoulder: Secondary | ICD-10-CM | POA: Diagnosis not present

## 2022-02-09 DIAGNOSIS — M25811 Other specified joint disorders, right shoulder: Secondary | ICD-10-CM | POA: Diagnosis not present

## 2022-02-11 DIAGNOSIS — M25811 Other specified joint disorders, right shoulder: Secondary | ICD-10-CM | POA: Diagnosis not present

## 2022-02-16 DIAGNOSIS — M25811 Other specified joint disorders, right shoulder: Secondary | ICD-10-CM | POA: Diagnosis not present

## 2022-02-18 DIAGNOSIS — M25811 Other specified joint disorders, right shoulder: Secondary | ICD-10-CM | POA: Diagnosis not present

## 2022-02-23 DIAGNOSIS — M25811 Other specified joint disorders, right shoulder: Secondary | ICD-10-CM | POA: Diagnosis not present

## 2022-02-25 DIAGNOSIS — M25811 Other specified joint disorders, right shoulder: Secondary | ICD-10-CM | POA: Diagnosis not present

## 2022-03-04 DIAGNOSIS — M25811 Other specified joint disorders, right shoulder: Secondary | ICD-10-CM | POA: Diagnosis not present

## 2022-03-09 DIAGNOSIS — M25811 Other specified joint disorders, right shoulder: Secondary | ICD-10-CM | POA: Diagnosis not present

## 2022-03-11 DIAGNOSIS — M25811 Other specified joint disorders, right shoulder: Secondary | ICD-10-CM | POA: Diagnosis not present

## 2022-03-16 DIAGNOSIS — M25811 Other specified joint disorders, right shoulder: Secondary | ICD-10-CM | POA: Diagnosis not present

## 2022-03-18 DIAGNOSIS — M25811 Other specified joint disorders, right shoulder: Secondary | ICD-10-CM | POA: Diagnosis not present

## 2022-03-22 DIAGNOSIS — M25811 Other specified joint disorders, right shoulder: Secondary | ICD-10-CM | POA: Diagnosis not present

## 2022-03-25 DIAGNOSIS — M25811 Other specified joint disorders, right shoulder: Secondary | ICD-10-CM | POA: Diagnosis not present

## 2022-03-30 DIAGNOSIS — M25811 Other specified joint disorders, right shoulder: Secondary | ICD-10-CM | POA: Diagnosis not present

## 2022-04-01 DIAGNOSIS — M25811 Other specified joint disorders, right shoulder: Secondary | ICD-10-CM | POA: Diagnosis not present

## 2022-04-13 DIAGNOSIS — I10 Essential (primary) hypertension: Secondary | ICD-10-CM | POA: Diagnosis not present

## 2022-04-13 DIAGNOSIS — G4700 Insomnia, unspecified: Secondary | ICD-10-CM | POA: Diagnosis not present

## 2022-04-13 DIAGNOSIS — Z23 Encounter for immunization: Secondary | ICD-10-CM | POA: Diagnosis not present

## 2022-04-13 DIAGNOSIS — E1165 Type 2 diabetes mellitus with hyperglycemia: Secondary | ICD-10-CM | POA: Diagnosis not present

## 2022-04-13 DIAGNOSIS — Z299 Encounter for prophylactic measures, unspecified: Secondary | ICD-10-CM | POA: Diagnosis not present

## 2022-04-23 DIAGNOSIS — H2701 Aphakia, right eye: Secondary | ICD-10-CM | POA: Diagnosis not present

## 2022-04-23 DIAGNOSIS — H338 Other retinal detachments: Secondary | ICD-10-CM | POA: Diagnosis not present

## 2022-04-23 DIAGNOSIS — H18421 Band keratopathy, right eye: Secondary | ICD-10-CM | POA: Diagnosis not present

## 2022-04-23 DIAGNOSIS — H25812 Combined forms of age-related cataract, left eye: Secondary | ICD-10-CM | POA: Diagnosis not present

## 2022-04-23 DIAGNOSIS — E119 Type 2 diabetes mellitus without complications: Secondary | ICD-10-CM | POA: Diagnosis not present

## 2022-04-23 DIAGNOSIS — H35372 Puckering of macula, left eye: Secondary | ICD-10-CM | POA: Diagnosis not present

## 2022-05-03 NOTE — Progress Notes (Signed)
History of Present Illness:   BPH--   8.27.2019: On avodart for about 1.5 years. Had TURP 2013.      2.11.2020: now off of Avodart. Most recent PSA is 6   10.13.2020: He returns today for follow-up now reporting significantly worsened urinary sx's (IPSS 6 --> 25).    6.7.2022: He still takes tamsulosin-sometimes 1 a day, sometimes 2.  He does still have bothersome urinary symptoms.  IPSS 21, quality-of-life score 4.   12.6.2022: He was treated for acute prostatitis in the summer 2022.  Another admission following that was for gross hematuria.  Patient ended up having encephalitis.  He did have issues with voiding, was increased to tamsulosin 2 capsules daily as well as finasteride.   4.25.2023: On finasteride, takes either 1 or 2 tamsulosin caps a day.  Perhaps some dizziness.  IPSS 6, quality-of-life score 2-3.  10.24.2023: He is still taking 2 tamsulosin capsules most days.  His urination has been stable.   Elevated PSA:    He has had 3 prior TRUS/biopsies. The first was in about 2005. Done by 3 separate urologists--in [place], [place] and [place]. Prior to biopsies PSA range was 9 to 11.    2.11.2020:  PSA 6.0-- off of Avodart.  10.13.2020: PSA 6.82.   2.9.2021: 9.4 4.25.2023: PSA 4.9, on finasteride    Past Medical History:  Diagnosis Date   BPH (benign prostatic hyperplasia)    Diabetes mellitus without complication [facility])     Past Surgical History:  Procedure Laterality Date   IR FLUORO GUIDED NEEDLE PLC ASPIRATION/INJECTION LOC  01/16/2021   TRANSURETHRAL RESECTION OF PROSTATE      Home Medications:  Allergies as of 05/04/2022   No Known Allergies      Medication List        Accurate as of May 03, 2022  8:58 PM. If you have any questions, ask your nurse or doctor.          DULoxetine 20 MG capsule Commonly known as: CYMBALTA Take 2 capsules (40 mg total) by mouth daily.   Eszopiclone 3 MG Tabs Take 1 tablet (3 mg total) by mouth at  bedtime. Take immediately before bedtime   finasteride 5 MG tablet Commonly known as: PROSCAR Take 1 tablet (5 mg total) by mouth daily.   gabapentin 600 MG tablet Commonly known as: NEURONTIN Take 1 tablet (600 mg total) by mouth 2 (two) times daily.   lithium 600 MG capsule Take 1 capsule (600 mg total) by mouth at bedtime.   melatonin 5 MG Tabs Take 1 tablet (5 mg total) by mouth at bedtime.   metFORMIN 500 MG tablet Commonly known as: GLUCOPHAGE Take 500 mg by mouth 2 (two) times daily.   rosuvastatin 5 MG tablet Commonly known as: CRESTOR Take by mouth.   tamsulosin 0.4 MG Caps capsule Commonly known as: FLOMAX Take 2 capsules by mouth once daily        Allergies: No Known Allergies  No family history on file.  Social History:  reports that he has quit smoking. He has never used smokeless tobacco. No history on file for alcohol use and drug use.  ROS: A complete review of systems was performed.  All systems are negative except for pertinent findings as noted.  Physical Exam:  Vital signs in last 24 hours: There were no vitals taken for this visit. Constitutional:  Alert and oriented, No acute distress Cardiovascular: Regular rate  Respiratory: Normal respiratory effort Neurologic: Grossly intact,  no focal deficits Psychiatric: Normal mood and affect  I have reviewed prior pt notes  I have reviewed urinalysis results  I have reviewed prior PSA results  I have reviewed prior urine culture   Impression/Assessment:  1.  BPH.  Symptoms are fairly stable.  He is still on 2 tamsulosin a day, however  2.  Elevated PSA, stable trend, on finasteride.  3 negative biopsies in the past  Plan:  1.  I still recommend that he try to wean off the tamsulosin  2.  Stay on finasteride  3.  I will see back in 1 year for recheck

## 2022-05-04 ENCOUNTER — Ambulatory Visit: Payer: Medicare HMO | Admitting: Urology

## 2022-05-04 VITALS — BP 131/80 | HR 68 | Ht 75.0 in | Wt 208.0 lb

## 2022-05-04 DIAGNOSIS — R339 Retention of urine, unspecified: Secondary | ICD-10-CM

## 2022-05-04 DIAGNOSIS — N401 Enlarged prostate with lower urinary tract symptoms: Secondary | ICD-10-CM

## 2022-05-04 DIAGNOSIS — R972 Elevated prostate specific antigen [PSA]: Secondary | ICD-10-CM

## 2022-05-04 DIAGNOSIS — N5201 Erectile dysfunction due to arterial insufficiency: Secondary | ICD-10-CM

## 2022-05-04 DIAGNOSIS — N400 Enlarged prostate without lower urinary tract symptoms: Secondary | ICD-10-CM

## 2022-05-04 LAB — URINALYSIS, ROUTINE W REFLEX MICROSCOPIC
Bilirubin, UA: NEGATIVE
Glucose, UA: NEGATIVE
Ketones, UA: NEGATIVE
Leukocytes,UA: NEGATIVE
Nitrite, UA: NEGATIVE
Protein,UA: NEGATIVE
RBC, UA: NEGATIVE
Specific Gravity, UA: 1.01 (ref 1.005–1.030)
Urobilinogen, Ur: 0.2 mg/dL (ref 0.2–1.0)
pH, UA: 6 (ref 5.0–7.5)

## 2022-05-04 NOTE — Addendum Note (Signed)
Addended by: Iris Pert on: 05/04/2022 12:29 PM   Modules accepted: Orders

## 2022-05-20 ENCOUNTER — Other Ambulatory Visit: Payer: Self-pay | Admitting: Psychiatry

## 2022-05-20 ENCOUNTER — Telehealth (INDEPENDENT_AMBULATORY_CARE_PROVIDER_SITE_OTHER): Payer: Medicare HMO | Admitting: Psychiatry

## 2022-05-20 ENCOUNTER — Encounter: Payer: Self-pay | Admitting: Psychiatry

## 2022-05-20 DIAGNOSIS — R69 Illness, unspecified: Secondary | ICD-10-CM | POA: Diagnosis not present

## 2022-05-20 DIAGNOSIS — F411 Generalized anxiety disorder: Secondary | ICD-10-CM

## 2022-05-20 DIAGNOSIS — Z79899 Other long term (current) drug therapy: Secondary | ICD-10-CM | POA: Diagnosis not present

## 2022-05-20 DIAGNOSIS — F314 Bipolar disorder, current episode depressed, severe, without psychotic features: Secondary | ICD-10-CM

## 2022-05-20 DIAGNOSIS — F5105 Insomnia due to other mental disorder: Secondary | ICD-10-CM

## 2022-05-20 DIAGNOSIS — E0841 Diabetes mellitus due to underlying condition with diabetic mononeuropathy: Secondary | ICD-10-CM

## 2022-05-20 MED ORDER — GABAPENTIN 600 MG PO TABS: 600.0000 mg | ORAL_TABLET | Freq: Two times a day (BID) | ORAL | 1 refills | Status: DC

## 2022-05-20 MED ORDER — ESZOPICLONE 3 MG PO TABS: 3.0000 mg | ORAL_TABLET | Freq: Every day | ORAL | 5 refills | Status: DC

## 2022-05-20 MED ORDER — DULOXETINE HCL 20 MG PO CPEP: 40.0000 mg | ORAL_CAPSULE | Freq: Every day | ORAL | 1 refills | Status: DC

## 2022-05-20 MED ORDER — LITHIUM CARBONATE 600 MG PO CAPS: 600.0000 mg | ORAL_CAPSULE | Freq: Every day | ORAL | 1 refills | Status: DC

## 2022-05-20 NOTE — Progress Notes (Signed)
Joseph Banks [id number] 10/15/1945 76 y.o.   Video Visit via My Chart  I connected with pt by My Chart video and verified that I am speaking with the correct person using two identifiers.   I discussed the limitations, risks, security and privacy concerns of performing an evaluation and management service by My Chart  and the availability of in person appointments. I also discussed with the patient that there may be a patient responsible charge related to this service. The patient expressed understanding and agreed to proceed.  I discussed the assessment and treatment plan with the patient. The patient was provided an opportunity to ask questions and all were answered. The patient agreed with the plan and demonstrated an understanding of the instructions.   The patient was advised to call back or seek an in-person evaluation if the symptoms worsen or if the condition fails to improve as anticipated.  I provided 30 minutes of video time during this encounter.  The patient was located at home and the provider was located office.  Session from 1000-1030  Subjective:   Patient ID:  Joseph Banks is a 76 y.o. (DOB 07/27/1945) male.  Chief Complaint:  Chief Complaint  Patient presents with   Follow-up    Severe bipolar I disorder, current or most recent episode depressed ([place])   Depression   Anxiety    Medication Refill Pertinent negatives include no fatigue.  Anxiety Patient reports no confusion, decreased concentration, dizziness, nervous/anxious behavior or suicidal ideas.    Depression        Associated symptoms include no decreased concentration, no fatigue and no suicidal ideas.  Past medical history includes anxiety.    Joseph Banks presents to the office today for follow-up of depression and anxiety.    In 2019 August.   Changed lithium slightly to 600 mg and increased the gabapentin to 600 BID.    visit in March 2020 he was doing well and was more compliant  with medication with a good response.  No meds were changed.  seen September2020 without  Med changes.  Main problems is disrupted sleep with some awakening and frequent periods of staying awake for a little while thereafter.  No depression but don't sleep well with nocturia and neuropathy with toes tingling.  PCP prescribed Lyrica for neuropathy.  Tingling is very uncomfortable at times.  Not taking any gabapentin in pm.  A little dizzy and nausea after morning meds when takes most everything.  09/2019 appt was well emotionally but sleep problems and neuropathy.   Plan:Since his neuropathy is not manage he needs to return to the previous dosage.  Start  600 mg twice daily to help neuropathy at night.   Check lihtium level.  03/24/20 appt with the following noted: Never got lithium level and stopped psych meds lithium and duloxetine AMA. "I had so many pills I was taking" for DM and prostate and decided he wanted to stop things.  Stopped April 1.  Denies depression or mood swings.  CO nocturia. Had called and increased trazodone for awhile. Taking gabapentin and pregabalin. Plan: Recommend he discuss with his primary care doctor gabapentin versus Lyrica.  Typically they are not taken together.  Discussed increased risk of side effects related to this. Continue duloxetine.  12/22/2020 appointment with the following noted: Stomach virus with Covid Neg Continues gabapentin and stopped Lyrica.  Also stopped Cymbalta and lithium.  Taking trazodone. Occ irritable. But overall mood is fine. Mood good.  No depressive  episodes.  Patient reports stable mood and denies depressed or irritable moods.  Patient denies any recent difficulty with anxiety.  Patient denies difficulty with sleep initiation but is interrrupted with with  nocturia but normal quantity. Denies appetite disturbance.  Patient reports that energy and motivation have been good.  Patient denies any difficulty with concentration.  Patient  denies any suicidal ideation. Plan: Resume lithium 600 mg nightly rec lithium level as soon as possible. Refuses mood stabilizing psych meds bc no sx currently including lithium.  05/08/21 appt noted: In hosp for almost a month and got prostatitis, meningitis and sepsis and lost 40#, July4- Aug 1.  OK Aug and Sept.  In October started feeling bad with tension, burning skin. Lost appetite.  Anxiety came first.  Low energy, motivation, concentration and tend to want to stay in bed. PCP gave him Xanax 0.25 mg helped minimally.  Drowsy from the anxiety.   He called his primary care doctor and our office within the last couple of days asking for something for anxiety.  His primary care doctor gave him alprazolam 0.25 mg which he has been taking twice daily with a little benefit.  States he has some depression due to the severity of the anxiety and the other symptoms that he is noticing.  He denies manic symptoms. Plan: It was explained to him he is likely to need a benzodiazepine in the short-term because he will not get a quick response from lithium and duloxetine most likely.  He would prefer to continue with the alprazolam Increase alprazolam to 0.25 mg 3 times daily as needed Start duloxetine 30 mg daily Resume lithium 600 mg nightly   07/16/2021 appointment with the following noted Burning skin and anxiety mostly gone.  Depression about 80% better. Not taking much Xanax. Asked about Ativan. Consistent with meds. No SE with meds. PCP switched from Ambien to Lunesta 3 mg HS about a week ago.  May feel a little better during the day but not sleeping quite as well at night.  Had been on Ambien since July.Marland Kitchen Appetite has returned after losing 40#. Slight dizziness and is better. Plan: Increase alprazolam to 0.25 mg 3 times daily as needed Increase duloxetine 40 mg daily  Resume lithium 600 mg nightly   09/16/2021 appointment with the following noted: Better and doing good now emotionally. Some EMA .   Taking Lunesta 3 mg and melatonin. Awaken 430 and wishes he didn't.  To bed 10.  Total hours 7 hours usually. Pickleball in AM. Anxiety entiredly gone and no Xanax.  No burning skin. Depression resolved. No SE except occ jerks.  Not a big deal. In [place] [employer] [place] visiting son. Plan: Be consistent bc history of relapse Ialprazolam to 0.25 mg 3 times daily as needed Continue duloxetine 40 mg daily  lithium 600 mg nightly   11/16/21 appt noted: No major problems.  Has to take a nap afternoon but gets up at 5 AM.   Still playing pickleball on Wed AM.   depression and anxiety are under control.  No mood swings. Continue benefit with meds. No SE .  Sometimes lightheaded ness is a better than it was.  Hx meningitis.  05/20/22 appt noted: Pretty consistent with meds and rare missing.   Psych meds : Lunesta 3 mg HS, duloxetine 20 mg BID, Lithium '600mg'$  HS, gabapentin 600 mg BID. Gets up 2-3 times per night usually to go to [facility].  Goes back to sleep pretty quickly.  Might nap for an  hour in the morning too.  To bed 10 and up at 5 but interrrupted.   During the day pretty well.  Might nap after lunch.   Still playing pickle ball about 2 times per week [facility]. No depressed or anxiety.  Don't understand how that happened. Mild constipation SE  Past Psychiatric Medication Trials: Citalopram no response,  paroxetine 40 with benefit, mirtazapine, duloxetine 90+ lithium with response,  aripiprazole 10, lithium 900,  trazodone with benefit, alprazolam,  buspirone  Review of Systems:  Review of Systems  Constitutional:  Negative for fatigue and unexpected weight change.  Gastrointestinal:  Positive for constipation.  Genitourinary:  Positive for frequency.       Nocturia  Neurological:  Negative for dizziness and tremors.       Tingling in toes  Psychiatric/Behavioral:  Negative for agitation, behavioral problems, confusion, decreased concentration, dysphoric mood, hallucinations,  self-injury, sleep disturbance and suicidal ideas. The patient is not nervous/anxious and is not hyperactive.     Medications: I have reviewed the patient's current medications.  Current Outpatient Medications  Medication Sig Dispense Refill   DULoxetine (CYMBALTA) 20 MG capsule Take 2 capsules (40 mg total) by mouth daily. 180 capsule 1   Eszopiclone 3 MG TABS Take 1 tablet (3 mg total) by mouth at bedtime. Take immediately before bedtime 30 tablet 5   finasteride (PROSCAR) 5 MG tablet Take 1 tablet (5 mg total) by mouth daily. 30 tablet 0   gabapentin (NEURONTIN) 600 MG tablet Take 1 tablet (600 mg total) by mouth 2 (two) times daily. 180 tablet 1   lithium 600 MG capsule Take 1 capsule (600 mg total) by mouth at bedtime. 90 capsule 1   melatonin 5 MG TABS Take 1 tablet (5 mg total) by mouth at bedtime. 30 tablet 0   metFORMIN (GLUCOPHAGE) 500 MG tablet Take 500 mg by mouth 2 (two) times daily.     rosuvastatin (CRESTOR) 5 MG tablet Take by mouth.     tamsulosin (FLOMAX) 0.4 MG CAPS capsule Take 2 capsules by mouth once daily 180 capsule 3   No current facility-administered medications for this visit.    Medication Side Effects: None  Allergies: No Known Allergies  Past Medical History:  Diagnosis Date   BPH (benign prostatic hyperplasia)    Diabetes mellitus without complication ([place])     History reviewed. No pertinent family history.  Social History   Socioeconomic History   Marital status: Married    Spouse name: Not on file   Number of children: Not on file   Years of education: Not on file   Highest education level: Not on file  Occupational History   Not on file  Tobacco Use   Smoking status: Former   Smokeless tobacco: Never  Substance and Sexual Activity   Alcohol use: Not on file   Drug use: Not on file   Sexual activity: Not on file  Other Topics Concern   Not on file  Social History Narrative   Not on file   Social Determinants of Health    Financial Resource Strain: Not on file  Food Insecurity: Not on file  Transportation Needs: Not on file  Physical Activity: Not on file  Stress: Not on file  Social Connections: Not on file  Intimate Partner Violence: Not on file    Past Medical History, Surgical history, Social history, and Family history were reviewed and updated as appropriate.   Please see review of systems  for further details on the patient's review from today.   Objective:   Physical Exam:  There were no vitals taken for this visit.  Physical Exam Neurological:     Mental Status: He is alert and oriented to person, place, and time.     Cranial Nerves: No dysarthria.  Psychiatric:        Attention and Perception: Attention normal. He is attentive. He does not perceive auditory hallucinations.        Mood and Affect: Mood is not anxious or depressed. Affect is not labile.        Speech: Speech normal. Speech is not rapid and pressured.        Behavior: Behavior is not agitated. Behavior is cooperative.        Thought Content: Thought content normal. Thought content is not paranoid or delusional. Thought content does not include homicidal or suicidal ideation. Thought content does not include suicidal plan.        Cognition and Memory: Cognition and memory normal.        Judgment: Judgment normal.     Comments: Insight fair.  Historically this is been a problem including now.   Much better not resolved     Lab Review:     Component Value Date/Time   NA 143 09/29/2021 0813   K 4.9 09/29/2021 0813   CL 103 09/29/2021 0813   CO2 25 09/29/2021 0813   GLUCOSE 128 (H) 09/29/2021 0813   GLUCOSE 126 (H) 02/05/2021 0539   BUN 15 09/29/2021 0813   CREATININE 0.99 09/29/2021 0813   CALCIUM 9.6 09/29/2021 0813   PROT 7.1 02/02/2021 0712   ALBUMIN 3.1 (L) 02/02/2021 0712   AST 30 02/02/2021 0712   ALT 34 02/02/2021 0712   ALKPHOS 46 02/02/2021 0712   BILITOT 0.7 02/02/2021 0712   GFRNONAA >60 02/05/2021  0539       Component Value Date/Time   WBC 5.0 02/05/2021 0539   RBC 3.50 (L) 02/05/2021 0539   HGB 10.9 (L) 02/05/2021 0539   HCT 32.5 (L) 02/05/2021 0539   PLT 470 (H) 02/05/2021 0539   MCV 92.9 02/05/2021 0539   MCH 31.1 02/05/2021 0539   MCHC 33.5 02/05/2021 0539   RDW 15.3 02/05/2021 0539   LYMPHSABS 2.5 02/05/2021 0539   MONOABS 0.6 02/05/2021 0539   EOSABS 0.2 02/05/2021 0539   BASOSABS 0.0 02/05/2021 0539    Lithium Lvl  Date Value Ref Range Status  09/29/2021 0.6 0.5 - 1.2 mmol/L Final    Comment:    A concentration of 0.5-0.8 mmol/L is advised for long-term use; concentrations of up to 1.2 mmol/L may be necessary during acute treatment.                                  Detection Limit = 0.1                           <0.1 indicates None Detected    09/29/21 lithium 0.6 on 600 mg pm  No results found for: "PHENYTOIN", "PHENOBARB", "VALPROATE", "CBMZ"   Labs from August 21, 2018 included BMP which was normal including calcium 9.4 and creatinine 1.2, TSH 1.85 normal, lithium 0.68 stable  .res Assessment: Plan:    Severe bipolar I disorder, current or most recent episode depressed ([place]) - Plan: Lithium level  Generalized anxiety disorder  Insomnia due to  mental condition  Lithium use   This was FU appointment which the patient requested because of severe anxiety symptoms and difficulty eating.  We discussed Importance of consistency emphasized bc history of compliance problems causing relapse.     He had been stable when compliant with the medications.  his history is consistent with bipolar disorder type II.  He stopped his medications AMA in 2021.  He was informed he would likely have a relapse and he did relapse.  He had a good response on duloxetine 30 mg plus lithium 600 mg daily in the past and had maintained some period of stability on that combination.  He failed other medications as noted.  It makes sense continue what worked before rather than  experimenting with new medications.   Better with increase duloxetine 40 mg daily and lithium.  Be consistent bc history of relapse Ialprazolam to 0.25 mg 3 times daily as needed Continue duloxetine 40 mg daily  lithium 600 mg nightly   For residual anxiety and depression.  Discussed the risk of mood cycling and mania with an antidepressant like Cymbalta.  However he has had a good response to it in the past and has taken higher doses.  Improved with increase duloxetine to 40 mg daily from 30 mg daily.  Counseled patient regarding potential benefits, risks, and side effects of lithium to include potential risk of lithium affecting thyroid and renal function.  Disc jerks on lithium. Discussed need for periodic lab monitoring to determine drug level and to assess for potential adverse effects.  Counseled patient regarding signs and symptoms of lithium toxicity and advised that they notify office immediately or seek urgent medical attention if experiencing these signs and symptoms.  Patient advised to contact office with any questions or concerns. He is on a low dose of lithium is at minimal risk of toxicity. He takes lithium in AM and disc trough level  09/29/21 lithium 0.6 on 600 mg pm Check lithium level before next appt   Continues gabapentin and likes it.  No Lyrica  Recommend follow-up in 6 mos  Lynder Parents, MD, DFAPA   Please see After Visit Summary for patient specific instructions.  Future Appointments  Date Time Provider [place]  05/03/2023  9:45 AM Diona Fanti, Annie Main, MD [facility] None    Orders Placed This Encounter  Procedures   Lithium level       -------------------------------

## 2022-05-24 ENCOUNTER — Other Ambulatory Visit: Payer: Self-pay | Admitting: Psychiatry

## 2022-05-24 DIAGNOSIS — E0841 Diabetes mellitus due to underlying condition with diabetic mononeuropathy: Secondary | ICD-10-CM

## 2022-05-25 DIAGNOSIS — L570 Actinic keratosis: Secondary | ICD-10-CM | POA: Diagnosis not present

## 2022-07-07 ENCOUNTER — Ambulatory Visit: Payer: Medicare HMO | Admitting: Internal Medicine

## 2022-07-07 ENCOUNTER — Encounter: Payer: Self-pay | Admitting: Internal Medicine

## 2022-07-07 ENCOUNTER — Ambulatory Visit (HOSPITAL_COMMUNITY)
Admission: RE | Admit: 2022-07-07 | Discharge: 2022-07-07 | Disposition: A | Discharge status: Home / Self Care | Payer: Medicare HMO | Source: Ambulatory Visit | Attending: Internal Medicine | Admitting: Internal Medicine

## 2022-07-07 VITALS — BP 134/80 | HR 70 | Temp 97.6°F | Ht 75.0 in | Wt 214.8 lb

## 2022-07-07 DIAGNOSIS — R059 Cough, unspecified: Secondary | ICD-10-CM | POA: Diagnosis not present

## 2022-07-07 DIAGNOSIS — R058 Other specified cough: Secondary | ICD-10-CM

## 2022-07-07 MED ORDER — PREDNISONE 10 MG PO TABS: ORAL_TABLET | ORAL | 0 refills | Status: DC

## 2022-07-07 MED ORDER — BENZONATATE 200 MG PO CAPS: 200.0000 mg | ORAL_CAPSULE | Freq: Three times a day (TID) | ORAL | 1 refills | Status: DC | PRN

## 2022-07-07 MED ORDER — PANTOPRAZOLE SODIUM 40 MG PO TBEC: 40.0000 mg | DELAYED_RELEASE_TABLET | Freq: Every day | ORAL | 2 refills | Status: DC

## 2022-07-07 MED ORDER — FAMOTIDINE 20 MG PO TABS: ORAL_TABLET | ORAL | 11 refills | Status: DC

## 2022-07-07 NOTE — Patient Instructions (Signed)
The key to effective treatment for your cough is eliminating the non-stop cycle of cough you're stuck in long enough to let your airway heal completely and then see if there is anything still making you cough once you stop the cough suppression, but this should take no more than 5 days to figure out  First take delsym two tsp every 12 hours and supplement if needed with tessalon 200 mg up  to every 406 hours to suppress the urge to cough at all or even clear your throat. Swallowing water or using ice chips/non mint and menthol containing candies (such as lifesavers or sugarless jolly ranchers) are also effective.  You should rest your voice and avoid activities that you know make you cough.  Once you have eliminated the cough for 3 straight days try reducing the tessalon  first,  then the delsym as tolerated.    Prednisone 10 mg take  4 each am x 2 days,   2 each am x 2 days,  1 each am x 2 days and stop (this is to eliminate allergies and inflammation from coughing)  Protonix (pantoprazole) Take 30-60 min before first meal of the day and Pepcid 20 mg one after supper until return   GERD (REFLUX)  is an extremely common cause of respiratory symptoms, many times with no significant heartburn at all.    It can be treated with medication, but also with lifestyle changes including avoidance of late meals, excessive alcohol, smoking cessation, and avoid fatty foods, chocolate, peppermint, colas, red wine, and acidic juices such as orange juice.  NO MINT OR MENTHOL PRODUCTS SO NO COUGH DROPS  USE HARD CANDY INSTEAD (jolley ranchers or Stover's or Lifesavers (all available in sugarless versions) NO OIL BASED VITAMINS - use powdered substitutes.  Please remember to go to the  x-ray department  @  [facility] for your tests - we will call you with the results when they are available     Please schedule a follow up office visit in 4 weeks, sooner if needed

## 2022-07-07 NOTE — Progress Notes (Unsigned)
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   Covid 19 2020 sick for 2 months but 100%   July 2022 UTI / sepsis admitted for a month in [facility]> 100% recovered 100%   Onset of cough May 2023 with sensation of chest congestion    History of Present Illness  07/07/2022  Pulmonary/ 1st office eval/ Antania Hoefling / [place] [facility]  Chief Complaint  Patient presents with   Consult    Cough for months. Gray/ green sputum   Dyspnea:  no change in ex tolerance works out at [employer] mostly wts Cough: varies but seems worse before lunch  Sleep: flat bed/ one pillow  SABA use: none  02: none No better with mucinex / has been worse on insp maneuvers   No obvious day to day or daytime pattern/variability or assoc excess/ purulent sputum or mucus plugs or hemoptysis or cp or chest tightness, subjective wheeze or overt sinus or hb symptoms.   *** without nocturnal  or early am exacerbation  of respiratory  c/o's or need for noct saba. Also denies any obvious fluctuation of symptoms with weather or environmental changes or other aggravating or alleviating factors except as outlined above   No unusual exposure hx or h/o childhood pna/ asthma or knowledge of premature birth.  Current Allergies, Complete Past Medical History, Past Surgical History, Family History, and Social History were reviewed in [employer] record.  ROS  The following are not active complaints unless bolded Hoarseness, sore throat, dysphagia, dental problems, itching, sneezing,  nasal congestion or discharge of excess mucus or purulent secretions, ear ache,   fever, chills, sweats, unintended wt loss or wt gain, classically pleuritic or exertional cp,  orthopnea pnd or arm/hand swelling  or leg swelling, presyncope, palpitations, abdominal pain, anorexia,  nausea, vomiting, diarrhea  or change in bowel habits or change in bladder habits, change in stools or change in urine, dysuria, hematuria,  rash, arthralgias, visual complaints, headache, numbness, weakness or ataxia or problems with walking or coordination,  change in mood or  memory.             Past Medical History:  Diagnosis Date   BPH (benign prostatic hyperplasia)    Diabetes mellitus without complication ([place])     Outpatient Medications Prior to Visit  Medication Sig Dispense Refill   DULoxetine (CYMBALTA) 20 MG capsule Take 2 capsules (40 mg total) by mouth daily. 180 capsule 1   Eszopiclone 3 MG TABS Take 1 tablet (3 mg total) by mouth at bedtime. Take immediately before bedtime 30 tablet 5   finasteride (PROSCAR) 5 MG tablet Take 1 tablet (5 mg total) by mouth daily. 30 tablet 0   gabapentin (NEURONTIN) 600 MG tablet Take 1 tablet (600 mg total) by mouth 2 (two) times daily. 180 tablet 1   lithium 600 MG capsule Take 1 capsule (600 mg total) by mouth at bedtime. 90 capsule 1   melatonin 5 MG TABS Take 1 tablet (5 mg total) by mouth at bedtime. 30 tablet 0   metFORMIN (GLUCOPHAGE) 500 MG tablet Take 500 mg by mouth 2 (two) times daily.     rosuvastatin (CRESTOR) 5 MG tablet Take by mouth.     tamsulosin (FLOMAX) 0.4 MG CAPS capsule Take 2 capsules by mouth once daily  180 capsule 3   No facility-administered medications prior to visit.     Objective:     BP 134/80   Pulse 70   Temp 97.6 F (36.4 C)   Ht '6\' 3"'$  (1.905 m)   Wt 214 lb 12.8 oz (97.4 kg)   SpO2 97% Comment: ra  BMI 26.85 kg/m   SpO2: 97 % (ra)  Somber amb wm nad   HEENT : Oropharynx  pristine      Nasal turbinates nl    NECK :  without  apparent JVD/ palpable Nodes/TM    LUNGS: no acc muscle use,  Nl contour chest which is clear to A and P bilaterally without cough on insp or exp maneuvers   CV:  RRR  no s3 or murmur or increase in P2, and no edema   ABD:  soft and nontender with nl  inspiratory excursion in the supine position. No bruits or organomegaly appreciated   MS:  Nl gait/ ext warm without deformities Or obvious joint restrictions  calf tenderness, cyanosis or clubbing    SKIN: warm and dry without lesions    NEURO:  alert, approp, nl sensorium with  no motor or cerebellar deficits apparent.       Assessment   No problem-specific Assessment & Plan notes found for this encounter.     Christinia Gully, MD 07/07/2022

## 2022-07-08 NOTE — Assessment & Plan Note (Signed)
Onset May 2023 with persistent globus and sensation of chest congestin s other assoc uri symptoms  - cyclical cough rx  33/[id number] >>>  Upper airway cough syndrome (previously labeled PNDS),  is so named because it's frequently impossible to sort out how much is  CR/sinusitis with freq throat clearing (which can be related to primary GERD)   vs  causing  secondary (" extra esophageal")  GERD from wide swings in gastric pressure that occur with throat clearing, often  promoting self use of mint and menthol lozenges that reduce the lower esophageal sphincter tone and exacerbate the problem further in a cyclical fashion.   These are the same pts (now being labeled as having "irritable larynx syndrome" by some cough centers) who not infrequently have a history of having failed to tolerate ace inhibitors,  dry powder inhalers or biphosphonates or report having atypical/extraesophageal reflux symptoms that don't respond to standard doses of PPI  and are easily confused as having aecopd or asthma flares by even experienced allergists/ pulmonologists (myself included).   Of the three most common causes of  Sub-acute / recurrent or chronic cough, only one (GERD)  can actually contribute to/ trigger  the other two (asthma and post nasal drip syndrome)  and perpetuate the cylce of cough.  While not intuitively obvious, many patients with chronic low grade reflux do not cough until there is a primary insult that disturbs the protective epithelial barrier and exposes sensitive nerve endings.   This is typically viral but can due to PNDS and  either may apply here.   The point is that once this occurs, it is difficult to eliminate the cycle  using anything but a maximally effective acid suppression regimen at least in the short run, accompanied by an appropriate diet to address non acid GERD and control / eliminate the cough itself for at least 3 days with delsy/ tessalon >>> also added 6 day taper off  Prednisone  starting at 40 mg per day in case of component of Th-2 driven upper or lower airways inflammation (if cough responds short term only to relapse before return while will on full rx for uacs (as above), then  that would point to allergic rhinitis/ asthma or eos bronchitis as alternative dx)   F/u in 4 weeks, call sooner if needed         Each maintenance medication was reviewed in detail including emphasizing most importantly the difference between maintenance and prns and under what circumstances the prns are to be triggered using an action plan format where appropriate.  Total time for H and P, chart review, counseling,   and generating customized AVS unique to this office visit / same day charting = 45 min / new pt eval

## 2022-07-28 DIAGNOSIS — H43812 Vitreous degeneration, left eye: Secondary | ICD-10-CM | POA: Diagnosis not present

## 2022-07-28 DIAGNOSIS — H2701 Aphakia, right eye: Secondary | ICD-10-CM | POA: Diagnosis not present

## 2022-07-28 DIAGNOSIS — H35372 Puckering of macula, left eye: Secondary | ICD-10-CM | POA: Diagnosis not present

## 2022-07-28 DIAGNOSIS — H33011 Retinal detachment with single break, right eye: Secondary | ICD-10-CM | POA: Diagnosis not present

## 2022-07-28 DIAGNOSIS — H25812 Combined forms of age-related cataract, left eye: Secondary | ICD-10-CM | POA: Diagnosis not present

## 2022-08-05 DIAGNOSIS — H25812 Combined forms of age-related cataract, left eye: Secondary | ICD-10-CM | POA: Diagnosis not present

## 2022-08-05 DIAGNOSIS — H52202 Unspecified astigmatism, left eye: Secondary | ICD-10-CM | POA: Diagnosis not present

## 2022-08-06 DIAGNOSIS — Z961 Presence of intraocular lens: Secondary | ICD-10-CM | POA: Diagnosis not present

## 2022-08-09 DIAGNOSIS — Z961 Presence of intraocular lens: Secondary | ICD-10-CM | POA: Diagnosis not present

## 2022-08-12 ENCOUNTER — Telehealth: Payer: Medicare HMO | Admitting: Internal Medicine

## 2022-08-25 DIAGNOSIS — Z961 Presence of intraocular lens: Secondary | ICD-10-CM | POA: Diagnosis not present

## 2022-09-01 DIAGNOSIS — R69 Illness, unspecified: Secondary | ICD-10-CM | POA: Diagnosis not present

## 2022-09-01 DIAGNOSIS — F314 Bipolar disorder, current episode depressed, severe, without psychotic features: Secondary | ICD-10-CM | POA: Diagnosis not present

## 2022-09-02 LAB — LITHIUM LEVEL: Lithium Lvl: 0.9 mmol/L (ref 0.5–1.2)

## 2022-11-18 ENCOUNTER — Telehealth (INDEPENDENT_AMBULATORY_CARE_PROVIDER_SITE_OTHER): Payer: Medicare HMO | Admitting: Psychiatry

## 2022-11-18 ENCOUNTER — Encounter: Payer: Self-pay | Admitting: Psychiatry

## 2022-11-18 DIAGNOSIS — F411 Generalized anxiety disorder: Secondary | ICD-10-CM

## 2022-11-18 DIAGNOSIS — F314 Bipolar disorder, current episode depressed, severe, without psychotic features: Secondary | ICD-10-CM

## 2022-11-18 DIAGNOSIS — F5105 Insomnia due to other mental disorder: Secondary | ICD-10-CM | POA: Diagnosis not present

## 2022-11-18 DIAGNOSIS — E0841 Diabetes mellitus due to underlying condition with diabetic mononeuropathy: Secondary | ICD-10-CM

## 2022-11-18 DIAGNOSIS — Z79899 Other long term (current) drug therapy: Secondary | ICD-10-CM | POA: Diagnosis not present

## 2022-11-18 MED ORDER — GABAPENTIN 600 MG PO TABS: 600.0000 mg | ORAL_TABLET | Freq: Two times a day (BID) | ORAL | 1 refills | Status: DC

## 2022-11-18 MED ORDER — ESZOPICLONE 3 MG PO TABS
3.0000 mg | ORAL_TABLET | Freq: Every day | ORAL | 5 refills | Status: DC
Start: 2022-11-18 — End: 2023-06-03

## 2022-11-18 MED ORDER — LITHIUM CARBONATE 600 MG PO CAPS: 600.0000 mg | ORAL_CAPSULE | Freq: Every day | ORAL | 1 refills | Status: DC

## 2022-11-18 MED ORDER — DULOXETINE HCL 20 MG PO CPEP: 40.0000 mg | ORAL_CAPSULE | Freq: Every day | ORAL | 1 refills | Status: DC

## 2022-11-18 NOTE — Progress Notes (Signed)
Joseph Banks [id number] February 28, 1946 77 y.o.   Video Visit via My Chart  I connected with pt by My Chart video and verified that I am speaking with the correct person using two identifiers.   I discussed the limitations, risks, security and privacy concerns of performing an evaluation and management service by My Chart  and the availability of in person appointments. I also discussed with the patient that there may be a patient responsible charge related to this service. The patient expressed understanding and agreed to proceed.  I discussed the assessment and treatment plan with the patient. The patient was provided an opportunity to ask questions and all were answered. The patient agreed with the plan and demonstrated an understanding of the instructions.   The patient was advised to call back or seek an in-person evaluation if the symptoms worsen or if the condition fails to improve as anticipated.  I provided 30 minutes of video time during this encounter.  The patient was located at home and the provider was located office.  Session from 930  Subjective:   Patient ID:  Joseph Banks is a 77 y.o. (DOB 05/16/1946) male.  Chief Complaint:  Chief Complaint  Patient presents with   Follow-up    Medication Refill Pertinent negatives include no fatigue.  Anxiety Patient reports no confusion, decreased concentration, dizziness, nervous/anxious behavior or suicidal ideas.    Depression        Associated symptoms include no decreased concentration, no fatigue and no suicidal ideas.  Past medical history includes anxiety.    Joseph Banks presents to the office today for follow-up of depression and anxiety.    In 2019 August.   Changed lithium slightly to 600 mg and increased the gabapentin to 600 BID.    visit in March 2020 he was doing well and was more compliant with medication with a good response.  No meds were changed.  seen September2020 without  Med changes.  Main  problems is disrupted sleep with some awakening and frequent periods of staying awake for a little while thereafter.  No depression but don't sleep well with nocturia and neuropathy with toes tingling.  PCP prescribed Lyrica for neuropathy.  Tingling is very uncomfortable at times.  Not taking any gabapentin in pm.  A little dizzy and nausea after morning meds when takes most everything.  09/2019 appt was well emotionally but sleep problems and neuropathy.   Plan:Since his neuropathy is not manage he needs to return to the previous dosage.  Start  600 mg twice daily to help neuropathy at night.   Check lihtium level.  03/24/20 appt with the following noted: Never got lithium level and stopped psych meds lithium and duloxetine AMA. "I had so many pills I was taking" for DM and prostate and decided he wanted to stop things.  Stopped April 1.  Denies depression or mood swings.  CO nocturia. Had called and increased trazodone for awhile. Taking gabapentin and pregabalin. Plan: Recommend he discuss with his primary care doctor gabapentin versus Lyrica.  Typically they are not taken together.  Discussed increased risk of side effects related to this. Continue duloxetine.  12/22/2020 appointment with the following noted: Stomach virus with Covid Neg Continues gabapentin and stopped Lyrica.  Also stopped Cymbalta and lithium.  Taking trazodone. Occ irritable. But overall mood is fine. Mood good.  No depressive episodes.  Patient reports stable mood and denies depressed or irritable moods.  Patient denies any recent difficulty with anxiety.  Patient denies difficulty with sleep initiation but is interrrupted with with  nocturia but normal quantity. Denies appetite disturbance.  Patient reports that energy and motivation have been good.  Patient denies any difficulty with concentration.  Patient denies any suicidal ideation. Plan: Resume lithium 600 mg nightly rec lithium level as soon as possible. Refuses  mood stabilizing psych meds bc no sx currently including lithium.  05/08/21 appt noted: In hosp for almost a month and got prostatitis, meningitis and sepsis and lost 40#, July4- Aug 1.  OK Aug and Sept.  In October started feeling bad with tension, burning skin. Lost appetite.  Anxiety came first.  Low energy, motivation, concentration and tend to want to stay in bed. PCP gave him Xanax 0.25 mg helped minimally.  Drowsy from the anxiety.   He called his primary care doctor and our office within the last couple of days asking for something for anxiety.  His primary care doctor gave him alprazolam 0.25 mg which he has been taking twice daily with a little benefit.  States he has some depression due to the severity of the anxiety and the other symptoms that he is noticing.  He denies manic symptoms. Plan: It was explained to him he is likely to need a benzodiazepine in the short-term because he will not get a quick response from lithium and duloxetine most likely.  He would prefer to continue with the alprazolam Increase alprazolam to 0.25 mg 3 times daily as needed Start duloxetine 30 mg daily Resume lithium 600 mg nightly   07/16/2021 appointment with the following noted Burning skin and anxiety mostly gone.  Depression about 80% better. Not taking much Xanax. Asked about Ativan. Consistent with meds. No SE with meds. PCP switched from Ambien to Lunesta 3 mg HS about a week ago.  May feel a little better during the day but not sleeping quite as well at night.  Had been on Ambien since July.Marland Kitchen Appetite has returned after losing 40#. Slight dizziness and is better. Plan: Increase alprazolam to 0.25 mg 3 times daily as needed Increase duloxetine 40 mg daily  Resume lithium 600 mg nightly   09/16/2021 appointment with the following noted: Better and doing good now emotionally. Some EMA .  Taking Lunesta 3 mg and melatonin. Awaken 430 and wishes he didn't.  To bed 10.  Total hours 7 hours  usually. Pickleball in AM. Anxiety entiredly gone and no Xanax.  No burning skin. Depression resolved. No SE except occ jerks.  Not a big deal. In [place] [facility] [place] visiting son. Plan: Be consistent bc history of relapse Ialprazolam to 0.25 mg 3 times daily as needed Continue duloxetine 40 mg daily  lithium 600 mg nightly   11/16/21 appt noted: No major problems.  Has to take a nap afternoon but gets up at 5 AM.   Still playing pickleball on Wed AM.   depression and anxiety are under control.  No mood swings. Continue benefit with meds. No SE .  Sometimes lightheaded ness is a better than it was.  Hx meningitis.  05/20/22 appt noted: Pretty consistent with meds and rare missing.   Psych meds : Lunesta 3 mg HS, duloxetine 20 mg BID, Lithium 600mg  HS, gabapentin 600 mg BID. Gets up 2-3 times per night usually to go to [facility].  Goes back to sleep pretty quickly.  Might nap for an hour in the morning too.  To bed 10 and up at 5 but interrrupted.   During the day pretty  well.  Might nap after lunch.   Still playing pickle ball about 2 times per week and [facility]. No depressed or anxiety.  Don't understand how that happened. Mild constipation SE  11/18/22 appt noted:  video Meds as above.  SE constipation managed with MG Questions about sleep med.  Some restless sleep but naps 1 hour.  Usual in bed 10-4 and 1 hour of that awake. Not drowsy usually.   Mood good and not depressed.  Can get bored but also hikes, church and stays active. Son lives in [place]. No sig anxiety.   He and wife play pickle ball.  Under stress can get anxity.  But managing.   Satis fied with meds.   Past Psychiatric Medication Trials: Citalopram no response,  paroxetine 40 with benefit, mirtazapine, duloxetine 90+ lithium with response,  aripiprazole 10, lithium 900,  trazodone with benefit, alprazolam,  Buspirone Lunesta 3 mg HS  Review of Systems:  Review of Systems  Constitutional:  Negative  for fatigue and unexpected weight change.  Gastrointestinal:  Negative for constipation.  Genitourinary:  Positive for frequency.       Nocturia  Neurological:  Negative for dizziness and tremors.       Tingling in toes  Psychiatric/Behavioral:  Negative for agitation, behavioral problems, confusion, decreased concentration, dysphoric mood, hallucinations, self-injury, sleep disturbance and suicidal ideas. The patient is not nervous/anxious and is not hyperactive.     Medications: I have reviewed the patient's current medications.  Current Outpatient Medications  Medication Sig Dispense Refill   famotidine (PEPCID) 20 MG tablet One after supper 30 tablet 11   finasteride (PROSCAR) 5 MG tablet Take 1 tablet (5 mg total) by mouth daily. 30 tablet 0   melatonin 5 MG TABS Take 1 tablet (5 mg total) by mouth at bedtime. 30 tablet 0   metFORMIN (GLUCOPHAGE) 500 MG tablet Take 500 mg by mouth 2 (two) times daily.     pantoprazole (PROTONIX) 40 MG tablet Take 1 tablet (40 mg total) by mouth daily. Take 30-60 min before first meal of the day 30 tablet 2   predniSONE (DELTASONE) 10 MG tablet Take  4 each am x 2 days,   2 each am x 2 days,  1 each am x 2 days and stop 14 tablet 0   rosuvastatin (CRESTOR) 5 MG tablet Take by mouth.     tamsulosin (FLOMAX) 0.4 MG CAPS capsule Take 2 capsules by mouth once daily 180 capsule 3   benzonatate (TESSALON) 200 MG capsule Take 1 capsule (200 mg total) by mouth 3 (three) times daily as needed for cough. (Patient not taking: Reported on 11/18/2022) 30 capsule 1   DULoxetine (CYMBALTA) 20 MG capsule Take 2 capsules (40 mg total) by mouth daily. 180 capsule 1   Eszopiclone 3 MG TABS Take 1 tablet (3 mg total) by mouth at bedtime. Take immediately before bedtime 30 tablet 5   gabapentin (NEURONTIN) 600 MG tablet Take 1 tablet (600 mg total) by mouth 2 (two) times daily. 180 tablet 1   lithium 600 MG capsule Take 1 capsule (600 mg total) by mouth at bedtime. 90 capsule  1   No current facility-administered medications for this visit.    Medication Side Effects: None  Allergies: No Known Allergies  Past Medical History:  Diagnosis Date   BPH (benign prostatic hyperplasia)    Diabetes mellitus without complication (HCC)     History reviewed. No pertinent family history.  Social History  Socioeconomic History   Marital status: Married    Spouse name: Not on file   Number of children: Not on file   Years of education: Not on file   Highest education level: Not on file  Occupational History   Not on file  Tobacco Use   Smoking status: Former   Smokeless tobacco: Never  Substance and Sexual Activity   Alcohol use: Not on file   Drug use: Not on file   Sexual activity: Not on file  Other Topics Concern   Not on file  Social History Narrative   Not on file   Social Determinants of Health   Financial Resource Strain: Not on file  Food Insecurity: Not on file  Transportation Needs: Not on file  Physical Activity: Not on file  Stress: Not on file  Social Connections: Not on file  Intimate Partner Violence: Not on file    Past Medical History, Surgical history, Social history, and Family history were reviewed and updated as appropriate.   Please see review of systems for further details on the patient's review from today.   Objective:   Physical Exam:  There were no vitals taken for this visit.  Physical Exam Neurological:     Mental Status: He is alert and oriented to person, place, and time.     Cranial Nerves: No dysarthria.  Psychiatric:        Attention and Perception: Attention normal. He is attentive. He does not perceive auditory hallucinations.        Mood and Affect: Mood is not anxious or depressed. Affect is not labile.        Speech: Speech normal. Speech is not rapid and pressured.        Behavior: Behavior is not agitated. Behavior is cooperative.        Thought Content: Thought content normal. Thought content  is not paranoid or delusional. Thought content does not include homicidal or suicidal ideation. Thought content does not include suicidal plan.        Cognition and Memory: Cognition and memory normal.        Judgment: Judgment normal.     Comments: Insight fair.  Historically this is been a problem including now.   Good humor and mood.     Lab Review:     Component Value Date/Time   NA 143 09/29/2021 0813   K 4.9 09/29/2021 0813   CL 103 09/29/2021 0813   CO2 25 09/29/2021 0813   GLUCOSE 128 (H) 09/29/2021 0813   GLUCOSE 126 (H) 02/05/2021 0539   BUN 15 09/29/2021 0813   CREATININE 0.99 09/29/2021 0813   CALCIUM 9.6 09/29/2021 0813   PROT 7.1 02/02/2021 0712   ALBUMIN 3.1 (L) 02/02/2021 0712   AST 30 02/02/2021 0712   ALT 34 02/02/2021 0712   ALKPHOS 46 02/02/2021 0712   BILITOT 0.7 02/02/2021 0712   GFRNONAA >60 02/05/2021 0539       Component Value Date/Time   WBC 5.0 02/05/2021 0539   RBC 3.50 (L) 02/05/2021 0539   HGB 10.9 (L) 02/05/2021 0539   HCT 32.5 (L) 02/05/2021 0539   PLT 470 (H) 02/05/2021 0539   MCV 92.9 02/05/2021 0539   MCH 31.1 02/05/2021 0539   MCHC 33.5 02/05/2021 0539   RDW 15.3 02/05/2021 0539   LYMPHSABS 2.5 02/05/2021 0539   MONOABS 0.6 02/05/2021 0539   EOSABS 0.2 02/05/2021 0539   BASOSABS 0.0 02/05/2021 0539    Lithium Lvl  Date Value  Ref Range Status  09/01/2022 0.9 0.5 - 1.2 mmol/L Final    Comment:    A concentration of 0.5-0.8 mmol/L is advised for long-term use; concentrations of up to 1.2 mmol/L may be necessary during acute treatment.                                  Detection Limit = 0.1                           <0.1 indicates None Detected    09/29/21 lithium 0.6 on 600 mg pm  No results found for: "PHENYTOIN", "PHENOBARB", "VALPROATE", "CBMZ"   Labs from August 21, 2018 included BMP which was normal including calcium 9.4 and creatinine 1.2, TSH 1.85 normal, lithium 0.68 stable  .res Assessment: Plan:    Severe  bipolar I disorder, current or most recent episode depressed (HCC) - Plan: DULoxetine (CYMBALTA) 20 MG capsule, lithium 600 MG capsule  Generalized anxiety disorder - Plan: DULoxetine (CYMBALTA) 20 MG capsule, Eszopiclone 3 MG TABS  Insomnia due to mental condition  Lithium use  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  We discussed Importance of consistency emphasized bc history of compliance problems causing relapse.     He had been stable when compliant with the medications.  his history is consistent with bipolar disorder type II.  He stopped his medications AMA in 2021.  He was informed he would likely have a relapse and he did relapse.  He had a good response on duloxetine 30 mg plus lithium 600 mg daily in the past and had maintained some period of stability on that combination.  He failed other medications as noted.  It makes sense continue what worked before rather than experimenting with new medications.   Better with increase duloxetine 40 mg daily and lithium 600.  Be consistent bc history of relapse when not consistent  Disc alternative sleeper trazodone but WD from Lunesta so no reason to go through that with adequate resp to Lunesta right now  For residual anxiety and depression.  Discussed the risk of mood cycling and mania with an antidepressant like Cymbalta.  However he has had a good response to it in the past and has taken higher doses.  Improved with increase duloxetine to 40 mg daily from 30 mg daily.  Counseled patient regarding potential benefits, risks, and side effects of lithium to include potential risk of lithium affecting thyroid and renal function.  Disc jerks on lithium. Discussed need for periodic lab monitoring to determine drug level and to assess for  potential adverse effects.  Counseled patient regarding signs and symptoms of lithium toxicity and advised that they notify office immediately or seek urgent medical attention if experiencing these signs and symptoms.  Patient advised to contact office with any questions or concerns. He is on a low dose of lithium is at minimal risk of toxicity. He takes lithium in AM and disc trough level  09/29/21 lithium 0.6 on 600 mg pm  lithium level 08/2022 = 0.9  No med changes: Continue duloxetine 40 mg daily  lithium 600 mg nightly  Continue Lunesta 3 mg HS Continues gabapentin 600 mg BID and likes it.  No Lyrica  Recommend  follow-up in 6 mos  Meredith Staggers, MD, DFAPA   Please see After Visit Summary for patient specific instructions.  Future Appointments  Date Time Provider Department Center  05/03/2023  9:45 AM Dahlstedt, Jeannett Senior, MD [facility] None    No orders of the defined types were placed in this encounter.      -------------------------------

## 2022-12-29 DIAGNOSIS — E1140 Type 2 diabetes mellitus with diabetic neuropathy, unspecified: Secondary | ICD-10-CM | POA: Diagnosis not present

## 2022-12-29 DIAGNOSIS — D692 Other nonthrombocytopenic purpura: Secondary | ICD-10-CM | POA: Diagnosis not present

## 2022-12-29 DIAGNOSIS — Z299 Encounter for prophylactic measures, unspecified: Secondary | ICD-10-CM | POA: Diagnosis not present

## 2022-12-29 DIAGNOSIS — Z1331 Encounter for screening for depression: Secondary | ICD-10-CM | POA: Diagnosis not present

## 2022-12-29 DIAGNOSIS — Z7189 Other specified counseling: Secondary | ICD-10-CM | POA: Diagnosis not present

## 2022-12-29 DIAGNOSIS — I10 Essential (primary) hypertension: Secondary | ICD-10-CM | POA: Diagnosis not present

## 2022-12-29 DIAGNOSIS — I700 Atherosclerosis of aorta: Secondary | ICD-10-CM | POA: Diagnosis not present

## 2022-12-29 DIAGNOSIS — Z0000 Encounter for general adult medical examination without abnormal findings: Secondary | ICD-10-CM | POA: Diagnosis not present

## 2022-12-29 DIAGNOSIS — Z1339 Encounter for screening examination for other mental health and behavioral disorders: Secondary | ICD-10-CM | POA: Diagnosis not present

## 2023-01-10 DIAGNOSIS — C44629 Squamous cell carcinoma of skin of left upper limb, including shoulder: Secondary | ICD-10-CM | POA: Diagnosis not present

## 2023-01-10 DIAGNOSIS — D0362 Melanoma in situ of left upper limb, including shoulder: Secondary | ICD-10-CM | POA: Diagnosis not present

## 2023-01-10 DIAGNOSIS — L570 Actinic keratosis: Secondary | ICD-10-CM | POA: Diagnosis not present

## 2023-01-10 DIAGNOSIS — D485 Neoplasm of uncertain behavior of skin: Secondary | ICD-10-CM | POA: Diagnosis not present

## 2023-01-11 ENCOUNTER — Other Ambulatory Visit: Payer: Self-pay | Admitting: Psychiatry

## 2023-01-11 DIAGNOSIS — F314 Bipolar disorder, current episode depressed, severe, without psychotic features: Secondary | ICD-10-CM

## 2023-02-17 DIAGNOSIS — D0362 Melanoma in situ of left upper limb, including shoulder: Secondary | ICD-10-CM | POA: Diagnosis not present

## 2023-02-17 DIAGNOSIS — C4362 Malignant melanoma of left upper limb, including shoulder: Secondary | ICD-10-CM | POA: Diagnosis not present

## 2023-03-03 DIAGNOSIS — Z0000 Encounter for general adult medical examination without abnormal findings: Secondary | ICD-10-CM | POA: Diagnosis not present

## 2023-03-03 DIAGNOSIS — E1165 Type 2 diabetes mellitus with hyperglycemia: Secondary | ICD-10-CM | POA: Diagnosis not present

## 2023-03-03 DIAGNOSIS — Z125 Encounter for screening for malignant neoplasm of prostate: Secondary | ICD-10-CM | POA: Diagnosis not present

## 2023-03-03 DIAGNOSIS — Z299 Encounter for prophylactic measures, unspecified: Secondary | ICD-10-CM | POA: Diagnosis not present

## 2023-03-03 DIAGNOSIS — R5383 Other fatigue: Secondary | ICD-10-CM | POA: Diagnosis not present

## 2023-03-03 DIAGNOSIS — L570 Actinic keratosis: Secondary | ICD-10-CM | POA: Diagnosis not present

## 2023-03-03 DIAGNOSIS — I10 Essential (primary) hypertension: Secondary | ICD-10-CM | POA: Diagnosis not present

## 2023-03-03 DIAGNOSIS — E7800 Pure hypercholesterolemia, unspecified: Secondary | ICD-10-CM | POA: Diagnosis not present

## 2023-03-03 DIAGNOSIS — E1140 Type 2 diabetes mellitus with diabetic neuropathy, unspecified: Secondary | ICD-10-CM | POA: Diagnosis not present

## 2023-03-03 DIAGNOSIS — C44629 Squamous cell carcinoma of skin of left upper limb, including shoulder: Secondary | ICD-10-CM | POA: Diagnosis not present

## 2023-03-03 DIAGNOSIS — Z79899 Other long term (current) drug therapy: Secondary | ICD-10-CM | POA: Diagnosis not present

## 2023-04-13 DIAGNOSIS — L570 Actinic keratosis: Secondary | ICD-10-CM | POA: Diagnosis not present

## 2023-04-27 DIAGNOSIS — E1140 Type 2 diabetes mellitus with diabetic neuropathy, unspecified: Secondary | ICD-10-CM | POA: Diagnosis not present

## 2023-04-27 DIAGNOSIS — Z7984 Long term (current) use of oral hypoglycemic drugs: Secondary | ICD-10-CM | POA: Diagnosis not present

## 2023-04-27 DIAGNOSIS — Z96649 Presence of unspecified artificial hip joint: Secondary | ICD-10-CM | POA: Diagnosis not present

## 2023-04-27 DIAGNOSIS — G4700 Insomnia, unspecified: Secondary | ICD-10-CM | POA: Diagnosis not present

## 2023-04-27 DIAGNOSIS — Z809 Family history of malignant neoplasm, unspecified: Secondary | ICD-10-CM | POA: Diagnosis not present

## 2023-04-27 DIAGNOSIS — E785 Hyperlipidemia, unspecified: Secondary | ICD-10-CM | POA: Diagnosis not present

## 2023-04-27 DIAGNOSIS — N400 Enlarged prostate without lower urinary tract symptoms: Secondary | ICD-10-CM | POA: Diagnosis not present

## 2023-04-27 DIAGNOSIS — F319 Bipolar disorder, unspecified: Secondary | ICD-10-CM | POA: Diagnosis not present

## 2023-04-27 DIAGNOSIS — Z8616 Personal history of COVID-19: Secondary | ICD-10-CM | POA: Diagnosis not present

## 2023-04-27 DIAGNOSIS — Z85828 Personal history of other malignant neoplasm of skin: Secondary | ICD-10-CM | POA: Diagnosis not present

## 2023-05-02 NOTE — Progress Notes (Signed)
History of Present Illness:   BPH--   Had TURP 2013.         Elevated PSA:    He has had 3 prior TRUS/biopsies. The first was in about 2005. Done by 3 separate urologists--in [place], [place] and [place]. Prior to biopsies PSA range was 9 to 11.    2.11.2020:  PSA 6.0-- off of Avodart.  10.13.2020: PSA 6.82.   2.9.2021: 9.4 4.25.2023: PSA 4.9, on finasteride  10.22.2024: Here today for routine check.  He is on 1 tamsulosin a day as well as finasteride.  No real bothersome lower urinary tract symptoms.  IPSS 7/2.    Past Medical History:  Diagnosis Date   BPH (benign prostatic hyperplasia)    Diabetes mellitus without complication [facility])     Past Surgical History:  Procedure Laterality Date   IR FLUORO GUIDED NEEDLE PLC ASPIRATION/INJECTION LOC  01/16/2021   TRANSURETHRAL RESECTION OF PROSTATE      Home Medications:  Allergies as of 05/03/2023   No Known Allergies      Medication List        Accurate as of May 02, 2023 12:48 PM. If you have any questions, ask your nurse or doctor.          benzonatate 200 MG capsule Commonly known as: TESSALON Take 1 capsule (200 mg total) by mouth 3 (three) times daily as needed for cough.   DULoxetine 20 MG capsule Commonly known as: CYMBALTA Take 2 capsules (40 mg total) by mouth daily.   Eszopiclone 3 MG Tabs Take 1 tablet (3 mg total) by mouth at bedtime. Take immediately before bedtime   famotidine 20 MG tablet Commonly known as: Pepcid One after supper   finasteride 5 MG tablet Commonly known as: PROSCAR Take 1 tablet (5 mg total) by mouth daily.   gabapentin 600 MG tablet Commonly known as: NEURONTIN Take 1 tablet (600 mg total) by mouth 2 (two) times daily.   lithium 600 MG capsule Take 1 capsule by mouth at bedtime   melatonin 5 MG Tabs Take 1 tablet (5 mg total) by mouth at bedtime.   metFORMIN 500 MG tablet Commonly known as: GLUCOPHAGE Take 500 mg by mouth 2 (two) times daily.    pantoprazole 40 MG tablet Commonly known as: Protonix Take 1 tablet (40 mg total) by mouth daily. Take 30-60 min before first meal of the day   predniSONE 10 MG tablet Commonly known as: DELTASONE Take  4 each am x 2 days,   2 each am x 2 days,  1 each am x 2 days and stop   rosuvastatin 5 MG tablet Commonly known as: CRESTOR Take by mouth.   tamsulosin 0.4 MG Caps capsule Commonly known as: FLOMAX Take 2 capsules by mouth once daily        Allergies: No Known Allergies  No family history on file.  Social History:  reports that he has quit smoking. He has never used smokeless tobacco. No history on file for alcohol use and drug use.  ROS: A complete review of systems was performed.  All systems are negative except for pertinent findings as noted.  Physical Exam:  Vital signs in last 24 hours: There were no vitals taken for this visit. Constitutional:  Alert and oriented, No acute distress Cardiovascular: Regular rate  Respiratory: Normal respiratory effort  GU-normal anal sphincter tone.  Prostate at least 100 g, symmetric, nonnodular, nontender.  No rectal masses. Neurologic: Grossly intact, no focal  deficits Psychiatric: Normal mood and affect  I have reviewed prior pt notes  Past hospital visit notes reviewed  I have reviewed urinalysis results  I have reviewed prior PSA results  I have reviewed prior urine culture   Impression/Assessment:  1.  BPH.  On dual medical therapy with finasteride/tamsulosin.  2.  Elevated PSA 3 negative biopsies in the past.  Benign exam today  3.  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  Continue dual medical therapy.  I feel like the finasteride will decrease risk of continued hematuria  2.  PSA is checked today  3.  Often come back in 1 year for recheck

## 2023-05-03 ENCOUNTER — Encounter: Payer: Self-pay | Admitting: Urology

## 2023-05-03 ENCOUNTER — Ambulatory Visit: Payer: Medicare HMO | Admitting: Urology

## 2023-05-03 VITALS — BP 150/84 | HR 66

## 2023-05-03 DIAGNOSIS — N400 Enlarged prostate without lower urinary tract symptoms: Secondary | ICD-10-CM | POA: Diagnosis not present

## 2023-05-03 DIAGNOSIS — R339 Retention of urine, unspecified: Secondary | ICD-10-CM | POA: Diagnosis not present

## 2023-05-03 DIAGNOSIS — R972 Elevated prostate specific antigen [PSA]: Secondary | ICD-10-CM | POA: Diagnosis not present

## 2023-05-03 DIAGNOSIS — R351 Nocturia: Secondary | ICD-10-CM | POA: Diagnosis not present

## 2023-05-03 DIAGNOSIS — Z87898 Personal history of other specified conditions: Secondary | ICD-10-CM | POA: Diagnosis not present

## 2023-05-03 DIAGNOSIS — N401 Enlarged prostate with lower urinary tract symptoms: Secondary | ICD-10-CM

## 2023-05-03 LAB — URINALYSIS, ROUTINE W REFLEX MICROSCOPIC
Bilirubin, UA: NEGATIVE
Glucose, UA: NEGATIVE
Ketones, UA: NEGATIVE
Leukocytes,UA: NEGATIVE
Nitrite, UA: NEGATIVE
Protein,UA: NEGATIVE
RBC, UA: NEGATIVE
Specific Gravity, UA: 1.025 (ref 1.005–1.030)
Urobilinogen, Ur: 0.2 mg/dL (ref 0.2–1.0)
pH, UA: 6 (ref 5.0–7.5)

## 2023-05-03 MED ORDER — TAMSULOSIN HCL 0.4 MG PO CAPS: 0.4000 mg | ORAL_CAPSULE | Freq: Every day | ORAL | Status: DC

## 2023-05-04 LAB — PSA: Prostate Specific Ag, Serum: 4.9 ng/mL — ABNORMAL HIGH (ref 0.0–4.0)

## 2023-06-03 ENCOUNTER — Other Ambulatory Visit: Payer: Self-pay | Admitting: Psychiatry

## 2023-06-03 DIAGNOSIS — F411 Generalized anxiety disorder: Secondary | ICD-10-CM

## 2023-06-03 NOTE — Telephone Encounter (Signed)
Called pt 11/22 and left message to call office to schedule an appt

## 2023-06-03 NOTE — Telephone Encounter (Signed)
Please schedule pt appt lv 05/9 due 6 months for f/u.

## 2023-06-03 NOTE — Telephone Encounter (Signed)
LV 05/9; LF 10/26 NV not scheduled yet.

## 2023-06-13 ENCOUNTER — Telehealth: Payer: Self-pay | Admitting: Psychiatry

## 2023-06-13 NOTE — Telephone Encounter (Signed)
Joseph Banks called at 11:45 asking if he needs to do Lithium Labs.  If so, he will the lab order.   Also, he read that he is suppose to be carrying a Lithium Card that indicated he takes Lithium so that if he is in a accident medical personnel will know he takes Lithium.  How does he get that card?

## 2023-06-17 NOTE — Telephone Encounter (Signed)
Pt's last Lithium Level in epic is from 08/2022. Asking for order, does he need anything else? Last office visit was May 2024.

## 2023-06-20 ENCOUNTER — Other Ambulatory Visit: Payer: Self-pay | Admitting: Psychiatry

## 2023-06-20 DIAGNOSIS — F314 Bipolar disorder, current episode depressed, severe, without psychotic features: Secondary | ICD-10-CM

## 2023-06-20 DIAGNOSIS — Z79899 Other long term (current) drug therapy: Secondary | ICD-10-CM

## 2023-06-20 NOTE — Telephone Encounter (Signed)
I sent lab orders to [facility] including for lithium.

## 2023-06-20 NOTE — Telephone Encounter (Signed)
Patient notified of lab orders

## 2023-06-27 ENCOUNTER — Other Ambulatory Visit: Payer: Self-pay | Admitting: Psychiatry

## 2023-06-27 DIAGNOSIS — L570 Actinic keratosis: Secondary | ICD-10-CM | POA: Diagnosis not present

## 2023-06-27 DIAGNOSIS — E0841 Diabetes mellitus due to underlying condition with diabetic mononeuropathy: Secondary | ICD-10-CM

## 2023-06-27 NOTE — Telephone Encounter (Signed)
Has an appt on 12/18

## 2023-06-27 NOTE — Telephone Encounter (Signed)
LF 09/6 LV 05/9

## 2023-06-27 NOTE — Telephone Encounter (Signed)
Please schedule pt an appt lv 05/9 due back in 6 months.

## 2023-06-29 ENCOUNTER — Ambulatory Visit: Payer: Medicare HMO | Admitting: Psychiatry

## 2023-06-29 ENCOUNTER — Encounter: Payer: Self-pay | Admitting: Psychiatry

## 2023-06-29 DIAGNOSIS — Z79899 Other long term (current) drug therapy: Secondary | ICD-10-CM | POA: Diagnosis not present

## 2023-06-29 DIAGNOSIS — F5105 Insomnia due to other mental disorder: Secondary | ICD-10-CM | POA: Diagnosis not present

## 2023-06-29 DIAGNOSIS — E0841 Diabetes mellitus due to underlying condition with diabetic mononeuropathy: Secondary | ICD-10-CM

## 2023-06-29 DIAGNOSIS — F314 Bipolar disorder, current episode depressed, severe, without psychotic features: Secondary | ICD-10-CM | POA: Diagnosis not present

## 2023-06-29 DIAGNOSIS — F411 Generalized anxiety disorder: Secondary | ICD-10-CM | POA: Diagnosis not present

## 2023-06-29 MED ORDER — DULOXETINE HCL 20 MG PO CPEP: 40.0000 mg | ORAL_CAPSULE | Freq: Every day | ORAL | 1 refills | Status: DC

## 2023-06-29 MED ORDER — ESZOPICLONE 3 MG PO TABS: 3.0000 mg | ORAL_TABLET | Freq: Every day | ORAL | 5 refills | Status: DC

## 2023-06-29 MED ORDER — GABAPENTIN 600 MG PO TABS: 600.0000 mg | ORAL_TABLET | Freq: Two times a day (BID) | ORAL | 1 refills | Status: AC

## 2023-06-29 MED ORDER — LITHIUM CARBONATE 600 MG PO CAPS: 600.0000 mg | ORAL_CAPSULE | Freq: Every day | ORAL | 1 refills | Status: DC

## 2023-06-29 NOTE — Progress Notes (Signed)
Joseph Banks [id number] May 13, 1946 77 y.o.    Subjective:   Patient ID:  Joseph Banks is a 77 y.o. (DOB 1946-04-05) male.  Chief Complaint:  Chief Complaint  Patient presents with   Follow-up   Depression   Anxiety   Sleeping Problem   Other    neuropathy    Medication Refill Pertinent negatives include no fatigue.  Anxiety Patient reports no confusion, decreased concentration, dizziness, nervous/anxious behavior or suicidal ideas.    Depression        Associated symptoms include no decreased concentration, no fatigue and no suicidal ideas.  Past medical history includes anxiety.    Joseph Banks presents to the office today for follow-up of depression and anxiety.    In 2019 August.   Changed lithium slightly to 600 mg and increased the gabapentin to 600 BID.    visit in March 2020 he was doing well and was more compliant with medication with a good response.  No meds were changed.  seen September2020 without  Med changes.  Main problems is disrupted sleep with some awakening and frequent periods of staying awake for a little while thereafter.  No depression but don't sleep well with nocturia and neuropathy with toes tingling.  PCP prescribed Lyrica for neuropathy.  Tingling is very uncomfortable at times.  Not taking any gabapentin in pm.  A little dizzy and nausea after morning meds when takes most everything.  09/2019 appt was well emotionally but sleep problems and neuropathy.   Plan:Since his neuropathy is not manage he needs to return to the previous dosage.  Start  600 mg twice daily to help neuropathy at night.   Check lihtium level.  03/24/20 appt with the following noted: Never got lithium level and stopped psych meds lithium and duloxetine AMA. "I had so many pills I was taking" for DM and prostate and decided he wanted to stop things.  Stopped April 1.  Denies depression or mood swings.  CO nocturia. Had called and increased trazodone for  awhile. Taking gabapentin and pregabalin. Plan: Recommend he discuss with his primary care doctor gabapentin versus Lyrica.  Typically they are not taken together.  Discussed increased risk of side effects related to this. Continue duloxetine.  12/22/2020 appointment with the following noted: Stomach virus with Covid Neg Continues gabapentin and stopped Lyrica.  Also stopped Cymbalta and lithium.  Taking trazodone. Occ irritable. But overall mood is fine. Mood good.  No depressive episodes.  Patient reports stable mood and denies depressed or irritable moods.  Patient denies any recent difficulty with anxiety.  Patient denies difficulty with sleep initiation but is interrrupted with with  nocturia but normal quantity. Denies appetite disturbance.  Patient reports that energy and motivation have been good.  Patient denies any difficulty with concentration.  Patient denies any suicidal ideation. Plan: Resume lithium 600 mg nightly rec lithium level as soon as possible. Refuses mood stabilizing psych meds bc no sx currently including lithium.  05/08/21 appt noted: In hosp for almost a month and got prostatitis, meningitis and sepsis and lost 40#, July4- Aug 1.  OK Aug and Sept.  In October started feeling bad with tension, burning skin. Lost appetite.  Anxiety came first.  Low energy, motivation, concentration and tend to want to stay in bed. PCP gave him Xanax 0.25 mg helped minimally.  Drowsy from the anxiety.   He called his primary care doctor and our office within the last couple of days asking for something for  anxiety.  His primary care doctor gave him alprazolam 0.25 mg which he has been taking twice daily with a little benefit.  States he has some depression due to the severity of the anxiety and the other symptoms that he is noticing.  He denies manic symptoms. Plan: It was explained to him he is likely to need a benzodiazepine in the short-term because he will not get a quick response from  lithium and duloxetine most likely.  He would prefer to continue with the alprazolam Increase alprazolam to 0.25 mg 3 times daily as needed Start duloxetine 30 mg daily Resume lithium 600 mg nightly   07/16/2021 appointment with the following noted Burning skin and anxiety mostly gone.  Depression about 80% better. Not taking much Xanax. Asked about Ativan. Consistent with meds. No SE with meds. PCP switched from Ambien to Lunesta 3 mg HS about a week ago.  May feel a little better during the day but not sleeping quite as well at night.  Had been on Ambien since July.Marland Kitchen Appetite has returned after losing 40#. Slight dizziness and is better. Plan: Increase alprazolam to 0.25 mg 3 times daily as needed Increase duloxetine 40 mg daily  Resume lithium 600 mg nightly   09/16/2021 appointment with the following noted: Better and doing good now emotionally. Some EMA .  Taking Lunesta 3 mg and melatonin. Awaken 430 and wishes he didn't.  To bed 10.  Total hours 7 hours usually. Pickleball in AM. Anxiety entiredly gone and no Xanax.  No burning skin. Depression resolved. No SE except occ jerks.  Not a big deal. In [facility] [place] visiting son. Plan: Be consistent bc history of relapse Ialprazolam to 0.25 mg 3 times daily as needed Continue duloxetine 40 mg daily  lithium 600 mg nightly   11/16/21 appt noted: No major problems.  Has to take a nap afternoon but gets up at 5 AM.   Still playing pickleball on Wed AM.   depression and anxiety are under control.  No mood swings. Continue benefit with meds. No SE .  Sometimes lightheaded ness is a better than it was.  Hx meningitis.  05/20/22 appt noted: Pretty consistent with meds and rare missing.   Psych meds : Lunesta 3 mg HS, duloxetine 20 mg BID, Lithium 600mg  HS, gabapentin 600 mg BID. Gets up 2-3 times per night usually to go to [facility].  Goes back to sleep pretty quickly.  Might nap for an hour in the morning too.  To bed 10 and up at 5 but  interrrupted.   During the day pretty well.  Might nap after lunch.   Still playing pickle ball about 2 times per week and [facility]. No depressed or anxiety.  Don't understand how that happened. Mild constipation SE  11/18/22 appt noted:  video Meds as above.  SE constipation managed with MG Questions about sleep med.  Some restless sleep but naps 1 hour.  Usual in bed 10-4 and 1 hour of that awake. Not drowsy usually.   Mood good and not depressed.  Can get bored but also hikes, church and stays active. Son lives in [place]. No sig anxiety.   He and wife play pickle ball.  Under stress can get anxity.  But managing.   Satis fied with meds. Plan no med changes  06/29/23 appt noted: Pt Joseph Banks doing well.  Wife in the car without complaints. Not sleeping great with some EFA and EMA. Naps most afternoons  for up to 1 hour.  Usually 7 hours.   Meds as above No SE concerns Read about Cymbalta SE from a particular generic.   PCP did labs lately normal.   Gabapentin helps neuropathy and will worsen if misses dose.     Past Psychiatric Medication Trials: Citalopram no response,  paroxetine 40 with benefit, mirtazapine, duloxetine 90+ lithium with response,  aripiprazole 10, lithium 900,  trazodone with benefit, alprazolam,  Buspirone Lunesta 3 mg HS  Review of Systems:  Review of Systems  Constitutional:  Negative for fatigue and unexpected weight change.  Gastrointestinal:  Positive for constipation.  Genitourinary:  Positive for frequency.       Nocturia  Neurological:  Negative for dizziness and tremors.       Tingling in toes  Psychiatric/Behavioral:  Negative for agitation, behavioral problems, confusion, decreased concentration, dysphoric mood, hallucinations, self-injury, sleep disturbance and suicidal ideas. The patient is not nervous/anxious and is not hyperactive.     Medications: I have reviewed the patient's current medications.  Current  Outpatient Medications  Medication Sig Dispense Refill   finasteride (PROSCAR) 5 MG tablet Take 1 tablet (5 mg total) by mouth daily. 30 tablet 0   melatonin 5 MG TABS Take 1 tablet (5 mg total) by mouth at bedtime. 30 tablet 0   metFORMIN (GLUCOPHAGE) 500 MG tablet Take 500 mg by mouth 2 (two) times daily.     rosuvastatin (CRESTOR) 5 MG tablet Take by mouth.     tamsulosin (FLOMAX) 0.4 MG CAPS capsule Take 1 capsule (0.4 mg total) by mouth daily.     DULoxetine (CYMBALTA) 20 MG capsule Take 2 capsules (40 mg total) by mouth daily. 180 capsule 1   Eszopiclone 3 MG TABS Take 1 tablet (3 mg total) by mouth at bedtime. Take immediately before bedtime 30 tablet 5   gabapentin (NEURONTIN) 600 MG tablet Take 1 tablet (600 mg total) by mouth 2 (two) times daily. 180 tablet 1   lithium 600 MG capsule Take 1 capsule (600 mg total) by mouth at bedtime. 90 capsule 1   No current facility-administered medications for this visit.    Medication Side Effects: None  Allergies: No Known Allergies  Past Medical History:  Diagnosis Date   BPH (benign prostatic hyperplasia)    Diabetes mellitus without complication (HCC)     History reviewed. No pertinent family history.  Social History   Socioeconomic History   Marital status: Married    Spouse name: Not on file   Number of children: Not on file   Years of education: Not on file   Highest education level: Not on file  Occupational History   Not on file  Tobacco Use   Smoking status: Former   Smokeless tobacco: Never  Substance and Sexual Activity   Alcohol use: Not on file   Drug use: Not on file   Sexual activity: Not on file  Other Topics Concern   Not on file  Social History Narrative   Not on file   Social Drivers of Health   Financial Resource Strain: Not on file  Food Insecurity: Not on file  Transportation Needs: Not on file  Physical Activity: Not on file  Stress: Not on file  Social Connections: Not on file  Intimate  Partner Violence: Not on file    Past Medical History, Surgical history, Social history, and Family history were reviewed and updated as appropriate.   Please see review of systems for further details on the patient's  review from today.   Objective:   Physical Exam:  There were no vitals taken for this visit.  Physical Exam Constitutional:      General: He is not in acute distress.    Appearance: He is well-developed.  Musculoskeletal:        General: No deformity.  Neurological:     Mental Status: He is alert and oriented to person, place, and time.     Cranial Nerves: No dysarthria.     Coordination: Coordination normal.  Psychiatric:        Attention and Perception: Attention normal. He is attentive. He does not perceive auditory hallucinations.        Mood and Affect: Mood is not anxious or depressed. Affect is not labile, blunt, angry or inappropriate.        Speech: Speech normal. Speech is not rapid and pressured.        Behavior: Behavior normal. Behavior is not agitated. Behavior is cooperative.        Thought Content: Thought content normal. Thought content is not paranoid or delusional. Thought content does not include homicidal or suicidal ideation. Thought content does not include suicidal plan.        Cognition and Memory: Cognition and memory normal.        Judgment: Judgment normal.     Comments: Insight fair.  Historically this is been a problem , but not interfering.  Good humor and mood.     Lab Review:     Component Value Date/Time   NA 143 09/29/2021 0813   K 4.9 09/29/2021 0813   CL 103 09/29/2021 0813   CO2 25 09/29/2021 0813   GLUCOSE 128 (H) 09/29/2021 0813   GLUCOSE 126 (H) 02/05/2021 0539   BUN 15 09/29/2021 0813   CREATININE 0.99 09/29/2021 0813   CALCIUM 9.6 09/29/2021 0813   PROT 7.1 02/02/2021 0712   ALBUMIN 3.1 (L) 02/02/2021 0712   AST 30 02/02/2021 0712   ALT 34 02/02/2021 0712   ALKPHOS 46 02/02/2021 0712   BILITOT 0.7 02/02/2021  0712   GFRNONAA >60 02/05/2021 0539       Component Value Date/Time   WBC 5.0 02/05/2021 0539   RBC 3.50 (L) 02/05/2021 0539   HGB 10.9 (L) 02/05/2021 0539   HCT 32.5 (L) 02/05/2021 0539   PLT 470 (H) 02/05/2021 0539   MCV 92.9 02/05/2021 0539   MCH 31.1 02/05/2021 0539   MCHC 33.5 02/05/2021 0539   RDW 15.3 02/05/2021 0539   LYMPHSABS 2.5 02/05/2021 0539   MONOABS 0.6 02/05/2021 0539   EOSABS 0.2 02/05/2021 0539   BASOSABS 0.0 02/05/2021 0539    Lithium Lvl  Date Value Ref Range Status  09/01/2022 0.9 0.5 - 1.2 mmol/L Final    Comment:    A concentration of 0.5-0.8 mmol/L is advised for long-term use; concentrations of up to 1.2 mmol/L may be necessary during acute treatment.                                  Detection Limit = 0.1                           <0.1 indicates None Detected    09/29/21 lithium 0.6 on 600 mg pm  No results found for: "PHENYTOIN", "PHENOBARB", "VALPROATE", "CBMZ"   Labs from August 21, 2018 included BMP which was normal including  calcium 9.4 and creatinine 1.2, TSH 1.85 normal, lithium 0.68 stable  .res Assessment: Plan:    Severe bipolar I disorder, current or most recent episode depressed (HCC) - Plan: Lithium level, DULoxetine (CYMBALTA) 20 MG capsule, lithium 600 MG capsule  Generalized anxiety disorder - Plan: DULoxetine (CYMBALTA) 20 MG capsule, Eszopiclone 3 MG TABS  Insomnia due to mental condition  Lithium use - Plan: Lithium level  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  We discussed Importance of consistency emphasized bc history of compliance problems causing relapse.     He had been stable when compliant with the medications.  his history is consistent with bipolar disorder type II.  He stopped his medications AMA in  2021.  He was informed he would likely have a relapse and he did relapse.  He had a good response on duloxetine 30 mg plus lithium 600 mg daily in the past and had maintained some period of stability on that combination.  He failed other medications as noted.  It makes sense continue what worked before rather than experimenting with new medications.   Better with increase duloxetine 40 mg daily and lithium 600.  Be consistent bc history of relapse when not consistent  Disc alternative sleeper trazodone but WD from Lunesta so no reason to go through that with adequate resp to Lunesta right now  For residual anxiety and depression.  Discussed the risk of mood cycling and mania with an antidepressant like Cymbalta.  However he has had a good response to it in the past and has taken higher doses.  Improved with increase duloxetine to 40 mg daily from 30 mg daily.  Reviewed Counseled patient regarding potential benefits, risks, and side effects of lithium to include potential risk of lithium affecting thyroid and renal function.  Disc jerks on lithium. Discussed need for periodic lab monitoring to determine drug level and to assess for potential adverse effects.  Counseled patient regarding signs and symptoms of lithium toxicity and advised that they notify office immediately or seek urgent medical attention if experiencing these signs and symptoms.  Patient advised to contact office with any questions or concerns. He is on a low dose of lithium is at minimal risk of toxicity. He takes lithium in AM and disc trough level  09/29/21 lithium 0.6 on 600 mg pm  lithium level 08/2022 = 0.9 Repeat level. Get other labs from PCP  No med changes: Continue duloxetine 40 mg daily  lithium 600 mg nightly .  Repeat level. Continue Lunesta 3 mg HS Continues gabapentin 600 mg BID and likes it.  No Lyrica  Recommend follow-up in 6 mos  Meredith Staggers, MD, DFAPA   Please see After Visit Summary for patient  specific instructions.  No future appointments.   Orders Placed This Encounter  Procedures   Lithium level       -------------------------------

## 2023-07-11 DIAGNOSIS — Z79899 Other long term (current) drug therapy: Secondary | ICD-10-CM | POA: Diagnosis not present

## 2023-07-11 DIAGNOSIS — F314 Bipolar disorder, current episode depressed, severe, without psychotic features: Secondary | ICD-10-CM | POA: Diagnosis not present

## 2023-07-13 LAB — LITHIUM LEVEL: Lithium Lvl: 0.8 mmol/L (ref 0.5–1.2)

## 2023-07-15 DIAGNOSIS — J069 Acute upper respiratory infection, unspecified: Secondary | ICD-10-CM | POA: Diagnosis not present

## 2023-07-15 DIAGNOSIS — R0981 Nasal congestion: Secondary | ICD-10-CM | POA: Diagnosis not present

## 2023-09-02 DIAGNOSIS — I700 Atherosclerosis of aorta: Secondary | ICD-10-CM | POA: Diagnosis not present

## 2023-09-02 DIAGNOSIS — D692 Other nonthrombocytopenic purpura: Secondary | ICD-10-CM | POA: Diagnosis not present

## 2023-09-02 DIAGNOSIS — G4700 Insomnia, unspecified: Secondary | ICD-10-CM | POA: Diagnosis not present

## 2023-09-20 DIAGNOSIS — G4700 Insomnia, unspecified: Secondary | ICD-10-CM | POA: Diagnosis not present

## 2023-09-20 DIAGNOSIS — D692 Other nonthrombocytopenic purpura: Secondary | ICD-10-CM | POA: Diagnosis not present

## 2023-09-20 DIAGNOSIS — I700 Atherosclerosis of aorta: Secondary | ICD-10-CM | POA: Diagnosis not present

## 2023-09-23 DIAGNOSIS — Z0100 Encounter for examination of eyes and vision without abnormal findings: Secondary | ICD-10-CM | POA: Diagnosis not present

## 2023-09-26 DIAGNOSIS — I700 Atherosclerosis of aorta: Secondary | ICD-10-CM | POA: Diagnosis not present

## 2023-09-26 DIAGNOSIS — D692 Other nonthrombocytopenic purpura: Secondary | ICD-10-CM | POA: Diagnosis not present

## 2023-09-26 DIAGNOSIS — F319 Bipolar disorder, unspecified: Secondary | ICD-10-CM | POA: Diagnosis not present

## 2023-09-26 DIAGNOSIS — G4700 Insomnia, unspecified: Secondary | ICD-10-CM | POA: Diagnosis not present

## 2023-10-11 ENCOUNTER — Other Ambulatory Visit: Payer: Self-pay | Admitting: Psychiatry

## 2023-10-11 ENCOUNTER — Other Ambulatory Visit: Payer: Self-pay | Admitting: Urology

## 2023-10-11 DIAGNOSIS — F314 Bipolar disorder, current episode depressed, severe, without psychotic features: Secondary | ICD-10-CM

## 2023-10-11 DIAGNOSIS — F411 Generalized anxiety disorder: Secondary | ICD-10-CM

## 2023-10-11 DIAGNOSIS — N400 Enlarged prostate without lower urinary tract symptoms: Secondary | ICD-10-CM

## 2023-10-11 MED ORDER — TAMSULOSIN HCL 0.4 MG PO CAPS: 0.4000 mg | ORAL_CAPSULE | Freq: Every day | ORAL | 3 refills | Status: AC

## 2023-12-26 ENCOUNTER — Ambulatory Visit: Payer: Medicare HMO | Admitting: Psychiatry

## 2023-12-26 ENCOUNTER — Encounter: Payer: Self-pay | Admitting: Psychiatry

## 2023-12-26 DIAGNOSIS — F314 Bipolar disorder, current episode depressed, severe, without psychotic features: Secondary | ICD-10-CM | POA: Diagnosis not present

## 2023-12-26 DIAGNOSIS — Z79899 Other long term (current) drug therapy: Secondary | ICD-10-CM | POA: Diagnosis not present

## 2023-12-26 DIAGNOSIS — F5105 Insomnia due to other mental disorder: Secondary | ICD-10-CM

## 2023-12-26 DIAGNOSIS — F411 Generalized anxiety disorder: Secondary | ICD-10-CM | POA: Diagnosis not present

## 2023-12-26 NOTE — Progress Notes (Signed)
 [name] [id number] [birth year] 78 y.o.    Subjective:   Patient ID:  Joseph Banks is a 78 y.o. (DOB 1946-01-28) male.  Chief Complaint:  Chief Complaint  Patient presents with   Follow-up   Depression   Anxiety   Medication Reaction    Joseph Banks presents to the office today for follow-up of depression and anxiety.    In 2019 August.   Changed lithium  slightly to 600 mg and increased the gabapentin  to 600 BID.    visit in March 2020 he was doing well and was more compliant with medication with a good response.  No meds were changed.  seen September2020 without  Med changes.  Main problems is disrupted sleep with some awakening and frequent periods of staying awake for a little while thereafter.  No depression but don't sleep well with nocturia and neuropathy with toes tingling.  PCP prescribed Lyrica for neuropathy.  Tingling is very uncomfortable at times.  Not taking any gabapentin  in pm.  A little dizzy and nausea after morning meds when takes most everything.  09/2019 appt was well emotionally but sleep problems and neuropathy.   Plan:Since his neuropathy is not manage he needs to return to the previous dosage.  Start  600 mg twice daily to help neuropathy at night.   Check lihtium level.  03/24/20 appt with the following noted: Never got lithium  level and stopped psych meds lithium  and duloxetine  AMA. I had so many pills I was taking for DM and prostate and decided he wanted to stop things.  Stopped April 1.  Denies depression or mood swings.  CO nocturia. Had called and increased trazodone  for awhile. Taking gabapentin  and pregabalin. Plan: Recommend he discuss with his primary care doctor gabapentin  versus Lyrica.  Typically they are not taken together.  Discussed increased risk of side effects related to this. Continue duloxetine .  12/22/2020 appointment with the following noted: Stomach virus with Covid Neg Continues gabapentin  and stopped Lyrica.   Also stopped Cymbalta  and lithium .  Taking trazodone . Occ irritable. But overall mood is fine. Mood good.  No depressive episodes.  Patient reports stable mood and denies depressed or irritable moods.  Patient denies any recent difficulty with anxiety.  Patient denies difficulty with sleep initiation but is interrrupted with with  nocturia but normal quantity. Denies appetite disturbance.  Patient reports that energy and motivation have been good.  Patient denies any difficulty with concentration.  Patient denies any suicidal ideation. Plan: Resume lithium  600 mg nightly rec lithium  level as soon as possible. Refuses mood stabilizing psych meds bc no sx currently including lithium .  05/08/21 appt noted: In hosp for almost a month and got prostatitis, meningitis and sepsis and lost 40#, July4- Aug 1.  OK Aug and Sept.  In October started feeling bad with tension, burning skin. Lost appetite.  Anxiety came first.  Low energy, motivation, concentration and tend to want to stay in bed. PCP gave him Xanax  0.25 mg helped minimally.  Drowsy from the anxiety.   He called his primary care doctor and our office within the last couple of days asking for something for anxiety.  His primary care doctor gave him alprazolam  0.25 mg which he has been taking twice daily with a little benefit.  States he has some depression due to the severity of the anxiety and the other symptoms that he is noticing.  He denies manic symptoms. Plan: It was explained to him he is likely to need a  benzodiazepine in the short-term because he will not get a quick response from lithium  and duloxetine  most likely.  He would prefer to continue with the alprazolam  Increase alprazolam  to 0.25 mg 3 times daily as needed Start duloxetine  30 mg daily Resume lithium  600 mg nightly   07/16/2021 appointment with the following noted Burning skin and anxiety mostly gone.  Depression about 80% better. Not taking much Xanax . Asked about  Ativan . Consistent with meds. No SE with meds. PCP switched from Ambien  to Lunesta  3 mg HS about a week ago.  May feel a little better during the day but not sleeping quite as well at night.  Had been on Ambien  since July.Joseph Banks Appetite has returned after losing 40#. Slight dizziness and is better. Plan: Increase alprazolam  to 0.25 mg 3 times daily as needed Increase duloxetine  40 mg daily  Resume lithium  600 mg nightly   09/16/2021 appointment with the following noted: Better and doing good now emotionally. Some EMA .  Taking Lunesta  3 mg and melatonin. Awaken 430 and wishes he didn't.  To bed 10.  Total hours 7 hours usually. Pickleball in AM. Anxiety entiredly gone and no Xanax .  No burning skin. Depression resolved. No SE except occ jerks.  Not a big deal. In [facility] [place] visiting son. Plan: Be consistent bc history of relapse Ialprazolam to 0.25 mg 3 times daily as needed Continue duloxetine  40 mg daily  lithium  600 mg nightly   11/16/21 appt noted: No major problems.  Has to take a nap afternoon but gets up at 5 AM.   Still playing pickleball on Wed AM.   depression and anxiety are under control.  No mood swings. Continue benefit with meds. No SE .  Sometimes lightheaded ness is a better than it was.  Hx meningitis.  05/20/22 appt noted: Pretty consistent with meds and rare missing.   Psych meds : Lunesta  3 mg HS, duloxetine  20 mg BID, Lithium  600mg  HS, gabapentin  600 mg BID. Gets up 2-3 times per night usually to go to [facility].  Goes back to sleep pretty quickly.  Might nap for an hour in the morning too.  To bed 10 and up at 5 but interrrupted.   During the day pretty well.  Might nap after lunch.   Still playing pickle ball about 2 times per week and [facility]. No depressed or anxiety.  Don't understand how that happened. Mild constipation SE  11/18/22 appt noted:  video Meds as above.  SE constipation managed with MG Questions about sleep med.  Some restless sleep  but naps 1 hour.  Usual in bed 10-4 and 1 hour of that awake. Not drowsy usually.   Mood good and not depressed.  Can get bored but also hikes, church and stays active. Son lives in [place]. No sig anxiety.   He and wife play pickle ball.  Under stress can get anxity.  But managing.   Satis fied with meds. Plan no med changes  06/29/23 appt noted: Pt Joseph Banks doing well.  Wife in the car without complaints. Not sleeping great with some EFA and EMA. Naps most afternoons for up to 1 hour.  Usually 7 hours.   Meds as above No SE concerns Read about Cymbalta  SE from a particular generic.   PCP did labs lately normal.   Gabapentin  helps neuropathy and will worsen if misses dose.   Plan: No med changes: Continue duloxetine  40 mg daily  lithium  600 mg nightly .  Repeat level. Continue Lunesta  3 mg HS Continues gabapentin  600 mg BID and likes it.  No Lyrica  12/26/23 appt noted; Med: Psych meds : changed Lunesta  3 mg HS to temazepam 15, duloxetine  20 mg BID, Lithium  600mg  HS, gabapentin  600 mg BID for neuropathy. EFA.  Not a good sleeper.  Tried a couple of sleepers.  Will   watch TV when awakens.  Naps.    Temazepam not working great. Decaf tea. Not worrying.   Gabapentin  won't always manage neuropathy tingling.    Past Psychiatric Medication Trials: Citalopram no response,  paroxetine 40 with benefit, mirtazapine, duloxetine  90+ lithium  with response,  aripiprazole 10, lithium  900,  trazodone  with benefit,  alprazolam ,  Buspirone Lunesta  3 mg HS ? Hydroxyzine SE' Temazepam 15  Review of Systems:  Review of Systems  Constitutional:  Negative for fatigue and unexpected weight change.  Gastrointestinal:  Positive for constipation.  Genitourinary:  Positive for frequency.       Nocturia  Neurological:  Negative for dizziness and tremors.       Tingling in toes  Psychiatric/Behavioral:  Negative for agitation, behavioral problems, confusion, decreased concentration, dysphoric  mood, hallucinations, self-injury, sleep disturbance and suicidal ideas. The patient is not nervous/anxious and is not hyperactive.     Medications: I have reviewed the patient's current medications.  Current Outpatient Medications  Medication Sig Dispense Refill   DULoxetine  (CYMBALTA ) 20 MG capsule Take 2 capsules by mouth once daily 180 capsule 0   finasteride  (PROSCAR ) 5 MG tablet Take 1 tablet (5 mg total) by mouth daily. 30 tablet 0   gabapentin  (NEURONTIN ) 600 MG tablet Take 1 tablet (600 mg total) by mouth 2 (two) times daily. 180 tablet 1   lithium  600 MG capsule Take 1 capsule (600 mg total) by mouth at bedtime. 90 capsule 1   melatonin 5 MG TABS Take 1 tablet (5 mg total) by mouth at bedtime. 30 tablet 0   metFORMIN  (GLUCOPHAGE ) 500 MG tablet Take 500 mg by mouth 2 (two) times daily.     rosuvastatin (CRESTOR) 5 MG tablet Take by mouth.     tamsulosin  (FLOMAX ) 0.4 MG CAPS capsule Take 1 capsule (0.4 mg total) by mouth daily. 90 capsule 3   temazepam (RESTORIL) 15 MG capsule Take 15 mg by mouth at bedtime.     Eszopiclone  3 MG TABS Take 1 tablet (3 mg total) by mouth at bedtime. Take immediately before bedtime (Patient not taking: Reported on 12/26/2023) 30 tablet 5   No current facility-administered medications for this visit.    Medication Side Effects: None  Allergies: No Known Allergies  Past Medical History:  Diagnosis Date   BPH (benign prostatic hyperplasia)    Diabetes mellitus without complication (HCC)     History reviewed. No pertinent family history.  Social History   Socioeconomic History   Marital status: Married    Spouse name: Not on file   Number of children: Not on file   Years of education: Not on file   Highest education level: Not on file  Occupational History   Not on file  Tobacco Use   Smoking status: Former   Smokeless tobacco: Never  Substance and Sexual Activity   Alcohol  use: Not on file   Drug use: Not on file   Sexual activity:  Not on file  Other Topics Concern   Not on file  Social History Narrative   Not on file   Social Drivers of Health   Financial Resource Strain:  Not on file  Food Insecurity: Not on file  Transportation Needs: Not on file  Physical Activity: Not on file  Stress: Not on file  Social Connections: Not on file  Intimate Partner Violence: Not on file    Past Medical History, Surgical history, Social history, and Family history were reviewed and updated as appropriate.   Please see review of systems for further details on the patient's review from today.   Objective:   Physical Exam:  There were no vitals taken for this visit.  Physical Exam Constitutional:      General: He is not in acute distress.    Appearance: He is well-developed.   Musculoskeletal:        General: No deformity.   Neurological:     Mental Status: He is alert and oriented to person, place, and time.     Cranial Nerves: No dysarthria.     Coordination: Coordination normal.   Psychiatric:        Attention and Perception: Attention normal. He is attentive. He does not perceive auditory hallucinations.        Mood and Affect: Mood is not anxious or depressed. Affect is not labile, blunt, angry or inappropriate.        Speech: Speech normal. Speech is not rapid and pressured.        Behavior: Behavior normal. Behavior is not agitated. Behavior is cooperative.        Thought Content: Thought content normal. Thought content is not paranoid or delusional. Thought content does not include homicidal or suicidal ideation. Thought content does not include suicidal plan.        Cognition and Memory: Cognition and memory normal.        Judgment: Judgment normal.     Comments: Insight fair.  Historically this is been a problem , but not interfering.  Good humor and mood. Jokes     Lab Review:     Component Value Date/Time   NA 143 09/29/2021 0813   K 4.9 09/29/2021 0813   CL 103 09/29/2021 0813   CO2 25  09/29/2021 0813   GLUCOSE 128 (H) 09/29/2021 0813   GLUCOSE 126 (H) 02/05/2021 0539   BUN 15 09/29/2021 0813   CREATININE 0.99 09/29/2021 0813   CALCIUM 9.6 09/29/2021 0813   PROT 7.1 02/02/2021 0712   ALBUMIN  3.1 (L) 02/02/2021 0712   AST 30 02/02/2021 0712   ALT 34 02/02/2021 0712   ALKPHOS 46 02/02/2021 0712   BILITOT 0.7 02/02/2021 0712   GFRNONAA >60 02/05/2021 0539       Component Value Date/Time   WBC 5.0 02/05/2021 0539   RBC 3.50 (L) 02/05/2021 0539   HGB 10.9 (L) 02/05/2021 0539   HCT 32.5 (L) 02/05/2021 0539   PLT 470 (H) 02/05/2021 0539   MCV 92.9 02/05/2021 0539   MCH 31.1 02/05/2021 0539   MCHC 33.5 02/05/2021 0539   RDW 15.3 02/05/2021 0539   LYMPHSABS 2.5 02/05/2021 0539   MONOABS 0.6 02/05/2021 0539   EOSABS 0.2 02/05/2021 0539   BASOSABS 0.0 02/05/2021 0539    Lithium  Lvl  Date Value Ref Range Status  07/11/2023 0.8 0.5 - 1.2 mmol/L Final    Comment:    A concentration of 0.5-0.8 mmol/L is advised for long-term use; concentrations of up to 1.2 mmol/L may be necessary during acute treatment.  Detection Limit = 0.1                           <0.1 indicates None Detected   12/26/23 lithium  0.8 at 600  09/29/21 lithium  0.6 on 600 mg pm  No results found for: PHENYTOIN, PHENOBARB, VALPROATE, CBMZ   Labs from August 21, 2018 included BMP which was normal including calcium 9.4 and creatinine 1.2, TSH 1.85 normal, lithium  0.68 stable  .res Assessment: Plan:    Severe bipolar I disorder, current or most recent episode depressed (HCC)  Generalized anxiety disorder  Insomnia due to mental condition  Lithium  use   This was FU appointment which the patient requested because of severe anxiety symptoms and difficulty eating.  We discussed Importance of consistency emphasized bc history of compliance problems causing relapse.     He had been stable when compliant with the medications.  his history is consistent with  bipolar disorder type II.  He stopped his medications AMA in 2021.  He was informed he would likely have a relapse and he did relapse.  He had a good response on duloxetine  30 mg plus lithium  600 mg daily in the past and had maintained some period of stability on that combination.  He failed other medications as noted.  It makes sense continue what worked before rather than experimenting with new medications.   Better with increase duloxetine  40 mg daily and lithium  600.  Be consistent bc history of relapse when not consistent  Disc alternative sleeper trazodone  but WD from Lunesta  so no reason to go through that with adequate resp to Lunesta  right now  For residual anxiety and depression.  Discussed the risk of mood cycling and mania with an antidepressant like Cymbalta .  However he has had a good response to it in the past and has taken higher doses.  Improved with increase duloxetine  to 40 mg daily from 30 mg daily.  Reviewed Counseled patient regarding potential benefits, risks, and side effects of lithium  to include potential risk of lithium  affecting thyroid  and renal function.  Disc jerks on lithium . Discussed need for periodic lab monitoring to determine drug level and to assess for potential adverse effects.  Counseled patient regarding signs and symptoms of lithium  toxicity and advised that they notify office immediately or seek urgent medical attention if experiencing these signs and symptoms.  Patient advised to contact office with any questions or concerns. We disc lithium  toxicity in detail again.  Sx to watch for .   He takes lithium  in AM and disc trough level  09/29/21 lithium  0.6 on 600 mg pm  lithium  level 08/2022 = 0.9  Repeat level. Get other labs from PCP  Increase temazepam 30 mg HS. If it fails switch to trazodone  which helped in the past.  Continue duloxetine  40 mg daily  lithium  600 mg nightly .  Repeat level. Continue Lunesta  3 mg HS Continues gabapentin  600 mg BID  and likes it.  No Lyrica  Recommend follow-up in 6 mos  Nori Beat, MD, DFAPA   Please see After Visit Summary for patient specific instructions.  No future appointments.   No orders of the defined types were placed in this encounter.      -------------------------------

## 2023-12-28 ENCOUNTER — Ambulatory Visit: Payer: Medicare HMO | Admitting: Psychiatry

## 2023-12-28 DIAGNOSIS — L570 Actinic keratosis: Secondary | ICD-10-CM | POA: Diagnosis not present

## 2024-01-02 ENCOUNTER — Telehealth: Payer: Self-pay | Admitting: Psychiatry

## 2024-01-02 ENCOUNTER — Other Ambulatory Visit: Payer: Self-pay

## 2024-01-02 MED ORDER — TEMAZEPAM 30 MG PO CAPS: 30.0000 mg | ORAL_CAPSULE | Freq: Every evening | ORAL | 2 refills | Status: DC | PRN

## 2024-01-02 NOTE — Telephone Encounter (Signed)
 Next visit is 06/18/24. Amogh's wife called to request a refill for Temazepam 15 mg. His wife Orlean called and she is not listed on the DPR to speak to. Lunden's refill needed is Temazepam 15 mg.   [facility] [address] [place] - [address]   Phone: [phone]  Fax: [phone]

## 2024-01-02 NOTE — Telephone Encounter (Signed)
 Pended temazepam 30 mg to [facility] in [place].

## 2024-01-05 ENCOUNTER — Telehealth: Payer: Self-pay

## 2024-01-05 NOTE — Telephone Encounter (Signed)
 Prior authorization Temazepam 30 mg capsules #30/30 approved 07/13/23-07/11/24 [employer]

## 2024-01-10 DIAGNOSIS — F314 Bipolar disorder, current episode depressed, severe, without psychotic features: Secondary | ICD-10-CM | POA: Diagnosis not present

## 2024-01-10 DIAGNOSIS — Z79899 Other long term (current) drug therapy: Secondary | ICD-10-CM | POA: Diagnosis not present

## 2024-01-11 DIAGNOSIS — E119 Type 2 diabetes mellitus without complications: Secondary | ICD-10-CM | POA: Diagnosis not present

## 2024-01-11 DIAGNOSIS — G629 Polyneuropathy, unspecified: Secondary | ICD-10-CM | POA: Diagnosis not present

## 2024-01-11 DIAGNOSIS — G4709 Other insomnia: Secondary | ICD-10-CM | POA: Diagnosis not present

## 2024-01-11 DIAGNOSIS — G4730 Sleep apnea, unspecified: Secondary | ICD-10-CM | POA: Diagnosis not present

## 2024-01-11 LAB — LITHIUM LEVEL: Lithium Lvl: 0.7 mmol/L (ref 0.5–1.2)

## 2024-03-06 DIAGNOSIS — R5383 Other fatigue: Secondary | ICD-10-CM | POA: Diagnosis not present

## 2024-03-06 DIAGNOSIS — Z125 Encounter for screening for malignant neoplasm of prostate: Secondary | ICD-10-CM | POA: Diagnosis not present

## 2024-03-06 DIAGNOSIS — E7800 Pure hypercholesterolemia, unspecified: Secondary | ICD-10-CM | POA: Diagnosis not present

## 2024-03-06 DIAGNOSIS — Z79899 Other long term (current) drug therapy: Secondary | ICD-10-CM | POA: Diagnosis not present

## 2024-04-02 ENCOUNTER — Other Ambulatory Visit: Payer: Self-pay | Admitting: Psychiatry

## 2024-05-28 ENCOUNTER — Other Ambulatory Visit: Payer: Self-pay | Admitting: Psychiatry

## 2024-06-18 ENCOUNTER — Telehealth: Admitting: Psychiatry

## 2024-06-18 ENCOUNTER — Encounter: Payer: Self-pay | Admitting: Psychiatry

## 2024-06-18 DIAGNOSIS — F314 Bipolar disorder, current episode depressed, severe, without psychotic features: Secondary | ICD-10-CM | POA: Diagnosis not present

## 2024-06-18 DIAGNOSIS — Z79899 Other long term (current) drug therapy: Secondary | ICD-10-CM

## 2024-06-18 DIAGNOSIS — F5105 Insomnia due to other mental disorder: Secondary | ICD-10-CM

## 2024-06-18 DIAGNOSIS — E0841 Diabetes mellitus due to underlying condition with diabetic mononeuropathy: Secondary | ICD-10-CM

## 2024-06-18 DIAGNOSIS — F411 Generalized anxiety disorder: Secondary | ICD-10-CM | POA: Diagnosis not present

## 2024-06-18 MED ORDER — DULOXETINE HCL 20 MG PO CPEP: 40.0000 mg | ORAL_CAPSULE | Freq: Every day | ORAL | 1 refills | Status: AC

## 2024-06-18 MED ORDER — LITHIUM CARBONATE 600 MG PO CAPS: 600.0000 mg | ORAL_CAPSULE | Freq: Every day | ORAL | 1 refills | Status: AC

## 2024-06-18 MED ORDER — TEMAZEPAM 30 MG PO CAPS: 30.0000 mg | ORAL_CAPSULE | Freq: Every day | ORAL | 1 refills | Status: AC

## 2024-06-18 NOTE — Progress Notes (Signed)
 Joseph Banks [id number] [birth year] 78 y.o.   Video Visit via My Chart  I connected with pt by video using My Chart and verified that I am speaking with the correct person using two identifiers.   I discussed the limitations, risks, security and privacy concerns of performing an evaluation and management service by My Chart  and the availability of in person appointments. I also discussed with the patient that there may be a patient responsible charge related to this service. The patient expressed understanding and agreed to proceed.  I discussed the assessment and treatment plan with the patient. The patient was provided an opportunity to ask questions and all were answered. The patient agreed with the plan and demonstrated an understanding of the instructions.   The patient was advised to call back or seek an in-person evaluation if the symptoms worsen or if the condition fails to improve as anticipated.  I provided 30 minutes of video time during this encounter.  The patient was located at home and the provider was located office. Session [id number]  Subjective:   Patient ID:  Joseph Banks is a 78 y.o. (DOB 09/01/1945) male.  Chief Complaint:  Chief Complaint  Patient presents with   Follow-up    Mood meds and sleep    [name] presents to the office today for follow-up of depression and anxiety.    In 2019 August.   Changed lithium  slightly to 600 mg and increased the gabapentin  to 600 BID.    visit in March 2020 he was doing well and was more compliant with medication with a good response.  No meds were changed.  seen September2020 without  Med changes.  Main problems is disrupted sleep with some awakening and frequent periods of staying awake for a little while thereafter.  No depression but don't sleep well with nocturia and neuropathy with toes tingling.  PCP prescribed Lyrica for neuropathy.  Tingling is very uncomfortable at times.  Not taking any gabapentin  in  pm.  A little dizzy and nausea after morning meds when takes most everything.  09/2019 appt was well emotionally but sleep problems and neuropathy.   Plan:Since his neuropathy is not manage he needs to return to the previous dosage.  Start  600 mg twice daily to help neuropathy at night.   Check lihtium level.  03/24/20 appt with the following noted: Never got lithium  level and stopped psych meds lithium  and duloxetine  AMA. I had so many pills I was taking for DM and prostate and decided he wanted to stop things.  Stopped April 1.  Denies depression or mood swings.  CO nocturia. Had called and increased trazodone  for awhile. Taking gabapentin  and pregabalin. Plan: Recommend he discuss with his primary care doctor gabapentin  versus Lyrica.  Typically they are not taken together.  Discussed increased risk of side effects related to this. Continue duloxetine .  12/22/2020 appointment with the following noted: Stomach virus with Covid Neg Continues gabapentin  and stopped Lyrica.  Also stopped Cymbalta  and lithium .  Taking trazodone . Occ irritable. But overall mood is fine. Mood good.  No depressive episodes.  Patient reports stable mood and denies depressed or irritable moods.  Patient denies any recent difficulty with anxiety.  Patient denies difficulty with sleep initiation but is interrrupted with with  nocturia but normal quantity. Denies appetite disturbance.  Patient reports that energy and motivation have been good.  Patient denies any difficulty with concentration.  Patient denies any suicidal ideation. Plan: Resume lithium  600  mg nightly rec lithium  level as soon as possible. Refuses mood stabilizing psych meds bc no sx currently including lithium .  05/08/21 appt noted: In hosp for almost a month and got prostatitis, meningitis and sepsis and lost 40#, July4- Aug 1.  OK Aug and Sept.  In October started feeling bad with tension, burning skin. Lost appetite.  Anxiety came first.  Low  energy, motivation, concentration and tend to want to stay in bed. PCP gave him Xanax  0.25 mg helped minimally.  Drowsy from the anxiety.   He called his primary care doctor and our office within the last couple of days asking for something for anxiety.  His primary care doctor gave him alprazolam  0.25 mg which he has been taking twice daily with a little benefit.  States he has some depression due to the severity of the anxiety and the other symptoms that he is noticing.  He denies manic symptoms. Plan: It was explained to him he is likely to need a benzodiazepine in the short-term because he will not get a quick response from lithium  and duloxetine  most likely.  He would prefer to continue with the alprazolam  Increase alprazolam  to 0.25 mg 3 times daily as needed Start duloxetine  30 mg daily Resume lithium  600 mg nightly   07/16/2021 appointment with the following noted Burning skin and anxiety mostly gone.  Depression about 80% better. Not taking much Xanax . Asked about Ativan . Consistent with meds. No SE with meds. PCP switched from Ambien  to Lunesta  3 mg HS about a week ago.  May feel a little better during the day but not sleeping quite as well at night.  Had been on Ambien  since July.SABRA Appetite has returned after losing 40#. Slight dizziness and is better. Plan: Increase alprazolam  to 0.25 mg 3 times daily as needed Increase duloxetine  40 mg daily  Resume lithium  600 mg nightly   09/16/2021 appointment with the following noted: Better and doing good now emotionally. Some EMA .  Taking Lunesta  3 mg and melatonin. Awaken 430 and wishes he didn't.  To bed 10.  Total hours 7 hours usually. Pickleball in AM. Anxiety entiredly gone and no Xanax .  No burning skin. Depression resolved. No SE except occ jerks.  Not a big deal. In [facility] [place] visiting son. Plan: Be consistent bc history of relapse Ialprazolam to 0.25 mg 3 times daily as needed Continue duloxetine  40 mg daily  lithium  600 mg  nightly   11/16/21 appt noted: No major problems.  Has to take a nap afternoon but gets up at 5 AM.   Still playing pickleball on Wed AM.   depression and anxiety are under control.  No mood swings. Continue benefit with meds. No SE .  Sometimes lightheaded ness is a better than it was.  Hx meningitis.  05/20/22 appt noted: Pretty consistent with meds and rare missing.   Psych meds : Lunesta  3 mg HS, duloxetine  20 mg BID, Lithium  600mg  HS, gabapentin  600 mg BID. Gets up 2-3 times per night usually to go to [facility].  Goes back to sleep pretty quickly.  Might nap for an hour in the morning too.  To bed 10 and up at 5 but interrrupted.   During the day pretty well.  Might nap after lunch.   Still playing pickle ball about 2 times per week and [facility]. No depressed or anxiety.  Don't understand how that happened. Mild constipation SE  11/18/22 appt noted:  video Meds as above.  SE constipation managed with MG Questions about sleep med.  Some restless sleep but naps 1 hour.  Usual in bed 10-4 and 1 hour of that awake. Not drowsy usually.   Mood good and not depressed.  Can get bored but also hikes, church and stays active. Son lives in [place]. No sig anxiety.   He and wife play pickle ball.  Under stress can get anxity.  But managing.   Satis fied with meds. Plan no med changes  06/29/23 appt noted: Pt [place] doing well.  Wife in the car without complaints. Not sleeping great with some EFA and EMA. Naps most afternoons for up to 1 hour.  Usually 7 hours.   Meds as above No SE concerns Read about Cymbalta  SE from a particular generic.   PCP did labs lately normal.   Gabapentin  helps neuropathy and will worsen if misses dose.   Plan: No med changes: Continue duloxetine  40 mg daily  lithium  600 mg nightly .  Repeat level. Continue Lunesta  3 mg HS Continues gabapentin  600 mg BID and likes it.  No Lyrica  12/26/23 appt noted; Med: Psych meds : changed Lunesta  3 mg HS  to temazepam  15, duloxetine  20 mg BID, Lithium  600mg  HS, gabapentin  600 mg BID for neuropathy. EFA.  Not a good sleeper.  Tried a couple of sleepers.  Will   watch TV when awakens.  Naps.    Temazepam  not working great. Decaf tea. Not worrying.   Gabapentin  won't always manage neuropathy tingling.  Plan: Repeat level. Get other labs from PCP Increase temazepam  30 mg HS.  06/18/24 appt :  Med: duloxetine  20 mg BID, Lithium  600mg  HS, gabapentin  600 mg BID for neuropathy, temazepam  30 HS Doing well.  Went to [facility] to visit son and had tggood time.   Sleeping better with increased temazepam .  Bladder awakens him. No SE concerns.  Did have brief dizzy spell resolved.   Patient reports stable mood and denies depressed or irritable moods.  Patient denies any recent difficulty with anxiety.  Patient denies difficulty with sleep initiation or maintenance. Denies appetite disturbance.  Patient reports that energy and motivation have been good.  Patient denies any difficulty with concentration.  Patient denies any suicidal ideation.   Past Psychiatric Medication Trials: Citalopram no response,  paroxetine 40 with benefit, mirtazapine, duloxetine  90+ lithium  with response,  aripiprazole 10, lithium  900,  trazodone  with benefit,  alprazolam ,  Buspirone Lunesta  3 mg HS ? Hydroxyzine SE' Temazepam  15  Review of Systems:  Review of Systems  Constitutional:  Negative for fatigue and unexpected weight change.  Gastrointestinal:  Positive for constipation.  Genitourinary:  Positive for frequency.       Nocturia  Neurological:  Negative for dizziness and tremors.       Tingling in toes  Psychiatric/Behavioral:  Negative for agitation, behavioral problems, confusion, decreased concentration, dysphoric mood, hallucinations, self-injury, sleep disturbance and suicidal ideas. The patient is not nervous/anxious and is not hyperactive.     Medications: I have reviewed the patient's current  medications.  Current Outpatient Medications  Medication Sig Dispense Refill   finasteride  (PROSCAR ) 5 MG tablet Take 1 tablet (5 mg total) by mouth daily. 30 tablet 0   gabapentin  (NEURONTIN ) 600 MG tablet Take 1 tablet (600 mg total) by mouth 2 (two) times daily. 180 tablet 1   melatonin 5 MG TABS Take 1 tablet (5 mg total) by mouth at bedtime. 30 tablet 0   metFORMIN  (GLUCOPHAGE ) 500 MG tablet Take  500 mg by mouth 2 (two) times daily.     rosuvastatin (CRESTOR) 5 MG tablet Take by mouth.     tamsulosin  (FLOMAX ) 0.4 MG CAPS capsule Take 1 capsule (0.4 mg total) by mouth daily. 90 capsule 3   DULoxetine  (CYMBALTA ) 20 MG capsule Take 2 capsules (40 mg total) by mouth daily. 180 capsule 1   lithium  600 MG capsule Take 1 capsule (600 mg total) by mouth at bedtime. 90 capsule 1   temazepam  (RESTORIL ) 30 MG capsule Take 1 capsule (30 mg total) by mouth at bedtime. 90 capsule 1   No current facility-administered medications for this visit.    Medication Side Effects: None  Allergies: No Known Allergies  Past Medical History:  Diagnosis Date   BPH (benign prostatic hyperplasia)    Diabetes mellitus without complication (HCC)     History reviewed. No pertinent family history.  Social History   Socioeconomic History   Marital status: Married    Spouse name: Not on file   Number of children: Not on file   Years of education: Not on file   Highest education level: Not on file  Occupational History   Not on file  Tobacco Use   Smoking status: Former   Smokeless tobacco: Never  Substance and Sexual Activity   Alcohol  use: Not on file   Drug use: Not on file   Sexual activity: Not on file  Other Topics Concern   Not on file  Social History Narrative   Not on file   Social Drivers of Health   Financial Resource Strain: Not on file  Food Insecurity: Not on file  Transportation Needs: Not on file  Physical Activity: Not on file  Stress: Not on file  Social Connections: Not  on file  Intimate Partner Violence: Not on file    Past Medical History, Surgical history, Social history, and Family history were reviewed and updated as appropriate.   Please see review of systems for further details on the patient's review from today.   Objective:   Physical Exam:  There were no vitals taken for this visit.  Physical Exam Constitutional:      General: He is not in acute distress.    Appearance: He is well-developed.  Musculoskeletal:        General: No deformity.  Neurological:     Mental Status: He is alert and oriented to person, place, and time.     Cranial Nerves: No dysarthria.     Coordination: Coordination normal.  Psychiatric:        Attention and Perception: Attention normal. He is attentive. He does not perceive auditory hallucinations.        Mood and Affect: Mood is not anxious or depressed. Affect is not blunt, angry or inappropriate.        Speech: Speech normal. Speech is not rapid and pressured.        Behavior: Behavior normal. Behavior is not agitated. Behavior is cooperative.        Thought Content: Thought content normal. Thought content is not paranoid or delusional. Thought content does not include homicidal or suicidal ideation. Thought content does not include suicidal plan.        Cognition and Memory: Cognition and memory normal.        Judgment: Judgment normal.     Comments: Insight fair.  Historically this is been a problem , but not interfering.  Good humor and mood. Jokes     Lab Review:  Component Value Date/Time   NA 143 09/29/2021 0813   K 4.9 09/29/2021 0813   CL 103 09/29/2021 0813   CO2 25 09/29/2021 0813   GLUCOSE 128 (H) 09/29/2021 0813   GLUCOSE 126 (H) 02/05/2021 0539   BUN 15 09/29/2021 0813   CREATININE 0.99 09/29/2021 0813   CALCIUM 9.6 09/29/2021 0813   PROT 7.1 02/02/2021 0712   ALBUMIN  3.1 (L) 02/02/2021 0712   AST 30 02/02/2021 0712   ALT 34 02/02/2021 0712   ALKPHOS 46 02/02/2021 0712    BILITOT 0.7 02/02/2021 0712   GFRNONAA >60 02/05/2021 0539       Component Value Date/Time   WBC 5.0 02/05/2021 0539   RBC 3.50 (L) 02/05/2021 0539   HGB 10.9 (L) 02/05/2021 0539   HCT 32.5 (L) 02/05/2021 0539   PLT 470 (H) 02/05/2021 0539   MCV 92.9 02/05/2021 0539   MCH 31.1 02/05/2021 0539   MCHC 33.5 02/05/2021 0539   RDW 15.3 02/05/2021 0539   LYMPHSABS 2.5 02/05/2021 0539   MONOABS 0.6 02/05/2021 0539   EOSABS 0.2 02/05/2021 0539   BASOSABS 0.0 02/05/2021 0539    Lithium  Lvl  Date Value Ref Range Status  01/10/2024 0.7 0.5 - 1.2 mmol/L Final    Comment:    A concentration of 0.5-0.8 mmol/L is advised for long-term use; concentrations of up to 1.2 mmol/L may be necessary during acute treatment.                                  Detection Limit = 0.1                           <0.1 indicates None Detected   01/10/24 lithium   0.7 on 600 mg HS 12/26/23 lithium  0.8 at 600  09/29/21 lithium  0.6 on 600 mg pm  No results found for: PHENYTOIN, PHENOBARB, VALPROATE, CBMZ   Labs from August 21, 2018 included BMP which was normal including calcium 9.4 and creatinine 1.2, TSH 1.85 normal, lithium  0.68 stable  .res Assessment: Plan:    Severe bipolar I disorder, current or most recent episode depressed (HCC) - Plan: Lithium  level, DULoxetine  (CYMBALTA ) 20 MG capsule, lithium  600 MG capsule  Generalized anxiety disorder - Plan: DULoxetine  (CYMBALTA ) 20 MG capsule  Insomnia due to mental condition - Plan: temazepam  (RESTORIL ) 30 MG capsule  Lithium  use  Diabetic mononeuropathy associated with diabetes mellitus due to underlying condition [facility])   This was FU appointment which the patient requested because of severe anxiety symptoms and difficulty eating.  We discussed Importance of consistency emphasized bc history of compliance problems causing relapse.     He had been stable when compliant with the medications.  his history is consistent with bipolar disorder type II.   He stopped his medications AMA in 2021.  He was informed he would likely have a relapse and he did relapse.  He had a good response on duloxetine  30 mg plus lithium  600 mg daily in the past and had maintained some period of stability on that combination.  He failed other medications as noted.  It makes sense continue what worked before rather than experimenting with new medications.   Better with increase duloxetine  40 mg daily and lithium  600.  Be consistent bc history of relapse when not consistent  For residual anxiety and depression.  Discussed the risk of mood cycling and mania with  an antidepressant like Cymbalta .  However he has had a good response to it in the past and has taken higher doses.  Improved with increase duloxetine  to 40 mg daily from 30 mg daily.  Reviewed Counseled patient regarding potential benefits, risks, and side effects of lithium  to include potential risk of lithium  affecting thyroid  and renal function.  Disc jerks on lithium . Discussed need for periodic lab monitoring to determine drug level and to assess for potential adverse effects.  Counseled patient regarding signs and symptoms of lithium  toxicity and advised that they notify office immediately or seek urgent medical attention if experiencing these signs and symptoms.  Patient advised to contact office with any questions or concerns. We disc lithium  toxicity in detail again.  Sx to watch for .   He takes lithium  in AM and disc trough level  09/29/21 lithium  0.6 on 600 mg pm  lithium  level 08/2022 = 0.9, 02/03/24 - 0.7  Repeat level twice yearly bc stable. Get other labs from PCP  Benefit temazepam  30 mg HS. If it fails switch to trazodone  which helped in the past.  Continue duloxetine  40 mg daily  lithium  600 mg nightly .  Repeat level.  Continues gabapentin  600 mg BID and likes it.  No Lyrica.   per PCP.  Recommend follow-up in 6 mos  Lorene Macintosh, MD, DFAPA   Please see After Visit Summary for patient  specific instructions.  No future appointments.   Orders Placed This Encounter  Procedures   Lithium  level       -------------------------------

## 2024-06-20 DIAGNOSIS — L570 Actinic keratosis: Secondary | ICD-10-CM | POA: Diagnosis not present

## 2024-12-17 ENCOUNTER — Telehealth: Admitting: Psychiatry
